# Patient Record
Sex: Female | Born: 1950
Health system: Southern US, Community
[De-identification: ages and names within clinical notes are randomized; demographics above are authoritative.]

## PROBLEM LIST (undated history)

## (undated) DIAGNOSIS — E039 Hypothyroidism, unspecified: Secondary | ICD-10-CM

## (undated) DIAGNOSIS — E785 Hyperlipidemia, unspecified: Secondary | ICD-10-CM

## (undated) DIAGNOSIS — I2510 Atherosclerotic heart disease of native coronary artery without angina pectoris: Secondary | ICD-10-CM

## (undated) DIAGNOSIS — I700 Atherosclerosis of aorta: Secondary | ICD-10-CM

## (undated) DIAGNOSIS — R519 Headache, unspecified: Secondary | ICD-10-CM

## (undated) DIAGNOSIS — I779 Disorder of arteries and arterioles, unspecified: Secondary | ICD-10-CM

## (undated) DIAGNOSIS — J449 Chronic obstructive pulmonary disease, unspecified: Secondary | ICD-10-CM

## (undated) DIAGNOSIS — C069 Malignant neoplasm of mouth, unspecified: Secondary | ICD-10-CM

## (undated) DIAGNOSIS — J439 Emphysema, unspecified: Secondary | ICD-10-CM

## (undated) DIAGNOSIS — I10 Essential (primary) hypertension: Secondary | ICD-10-CM

## (undated) DIAGNOSIS — K219 Gastro-esophageal reflux disease without esophagitis: Secondary | ICD-10-CM

## (undated) DIAGNOSIS — I739 Peripheral vascular disease, unspecified: Secondary | ICD-10-CM

## (undated) HISTORY — DX: Malignant neoplasm of mouth, unspecified: C06.9

## (undated) HISTORY — PX: TONSILLECTOMY: SUR1361

## (undated) HISTORY — DX: Atherosclerotic heart disease of native coronary artery without angina pectoris: I25.10

## (undated) HISTORY — DX: Emphysema, unspecified: J43.9

## (undated) HISTORY — DX: Disorder of arteries and arterioles, unspecified: I77.9

## (undated) HISTORY — DX: Hypothyroidism, unspecified: E03.9

## (undated) HISTORY — DX: Essential (primary) hypertension: I10

## (undated) HISTORY — DX: Atherosclerosis of aorta: I70.0

## (undated) HISTORY — PX: TUBAL LIGATION: SHX77

## (undated) HISTORY — DX: Gastro-esophageal reflux disease without esophagitis: K21.9

## (undated) HISTORY — DX: Hyperlipidemia, unspecified: E78.5

## (undated) HISTORY — PX: APPENDECTOMY: SHX54

## (undated) HISTORY — DX: Peripheral vascular disease, unspecified: I73.9

---

## 1999-12-09 ENCOUNTER — Encounter: Payer: Self-pay | Admitting: Family Medicine

## 1999-12-09 ENCOUNTER — Encounter: Admission: RE | Admit: 1999-12-09 | Discharge: 1999-12-09 | Payer: Self-pay | Admitting: Family Medicine

## 2000-12-28 ENCOUNTER — Other Ambulatory Visit: Admission: RE | Admit: 2000-12-28 | Discharge: 2000-12-28 | Payer: Self-pay | Admitting: Family Medicine

## 2007-11-13 ENCOUNTER — Other Ambulatory Visit: Admission: RE | Admit: 2007-11-13 | Discharge: 2007-11-13 | Payer: Self-pay | Admitting: Family Medicine

## 2007-11-18 ENCOUNTER — Encounter: Admission: RE | Admit: 2007-11-18 | Discharge: 2007-11-18 | Payer: Self-pay | Admitting: Family Medicine

## 2007-11-21 ENCOUNTER — Encounter: Admission: RE | Admit: 2007-11-21 | Discharge: 2007-11-21 | Payer: Self-pay | Admitting: Family Medicine

## 2007-12-17 ENCOUNTER — Ambulatory Visit: Payer: Self-pay | Admitting: Vascular Surgery

## 2008-11-27 ENCOUNTER — Encounter: Admission: RE | Admit: 2008-11-27 | Discharge: 2008-11-27 | Payer: Self-pay | Admitting: Family Medicine

## 2008-12-23 ENCOUNTER — Ambulatory Visit: Payer: Self-pay | Admitting: Vascular Surgery

## 2009-03-26 ENCOUNTER — Encounter (INDEPENDENT_AMBULATORY_CARE_PROVIDER_SITE_OTHER): Payer: Self-pay | Admitting: *Deleted

## 2009-07-15 ENCOUNTER — Ambulatory Visit: Payer: Self-pay | Admitting: Internal Medicine

## 2009-07-15 ENCOUNTER — Ambulatory Visit: Payer: Self-pay | Admitting: Cardiovascular Disease

## 2009-07-15 ENCOUNTER — Inpatient Hospital Stay (HOSPITAL_COMMUNITY): Admission: EM | Admit: 2009-07-15 | Discharge: 2009-07-17 | Payer: Self-pay | Admitting: Emergency Medicine

## 2009-07-17 ENCOUNTER — Encounter: Payer: Self-pay | Admitting: Internal Medicine

## 2009-07-18 ENCOUNTER — Encounter: Payer: Self-pay | Admitting: Internal Medicine

## 2009-07-22 DIAGNOSIS — E039 Hypothyroidism, unspecified: Secondary | ICD-10-CM | POA: Insufficient documentation

## 2009-07-22 DIAGNOSIS — I6529 Occlusion and stenosis of unspecified carotid artery: Secondary | ICD-10-CM | POA: Insufficient documentation

## 2009-07-22 DIAGNOSIS — E782 Mixed hyperlipidemia: Secondary | ICD-10-CM | POA: Insufficient documentation

## 2009-07-22 DIAGNOSIS — I10 Essential (primary) hypertension: Secondary | ICD-10-CM | POA: Insufficient documentation

## 2009-07-22 DIAGNOSIS — I201 Angina pectoris with documented spasm: Secondary | ICD-10-CM | POA: Insufficient documentation

## 2009-07-22 DIAGNOSIS — E785 Hyperlipidemia, unspecified: Secondary | ICD-10-CM

## 2009-07-22 DIAGNOSIS — F17200 Nicotine dependence, unspecified, uncomplicated: Secondary | ICD-10-CM

## 2009-07-22 DIAGNOSIS — I2510 Atherosclerotic heart disease of native coronary artery without angina pectoris: Secondary | ICD-10-CM | POA: Insufficient documentation

## 2009-07-22 DIAGNOSIS — Z720 Tobacco use: Secondary | ICD-10-CM | POA: Insufficient documentation

## 2009-08-03 ENCOUNTER — Ambulatory Visit: Payer: Self-pay | Admitting: Cardiovascular Disease

## 2009-12-08 ENCOUNTER — Other Ambulatory Visit: Admission: RE | Admit: 2009-12-08 | Discharge: 2009-12-08 | Payer: Self-pay | Admitting: Family Medicine

## 2010-01-05 ENCOUNTER — Ambulatory Visit: Payer: Self-pay | Admitting: Vascular Surgery

## 2010-06-24 ENCOUNTER — Ambulatory Visit: Payer: Self-pay | Admitting: Cardiovascular Disease

## 2010-06-24 ENCOUNTER — Encounter: Payer: Self-pay | Admitting: Cardiovascular Disease

## 2010-08-12 NOTE — Letter (Signed)
[facility]   Imported By: Roderic Ovens 08/03/2009 11:42:54  _____________________________________________________________________  External Attachment:    Type:   Image     Comment:   External Document

## 2010-08-12 NOTE — Assessment & Plan Note (Signed)
Summary: eph/jml   Visit Type:  Follow-up Primary Provider:  Beverley Fiedler, MD  CC:  pt was in hos 2 wks ago for cp..denies any cp since hosp..does have sob though.  History of Present Illness: 60 yo WF with history of HTN, hyperlipidemia, tobacco abuse, carotid artery disease and recently diagnosed non-obstructive CAD during admission to [facility] 07/15/09 with NSTEMI. Her event was felt to be secondary to coronary vasospasm. She is here today for follow up. She tells me that she has been doing well. She has had no recurrence of her chest pain. She does report some SOB at baseline but this has not changed. No near syncope, syncope, lower ext edema, palpitations.   She stopped smoking in November. She has known carotid artery disease followed by Dr. Hart Rochester.   Current Medications (verified): 1)  Aspirin Ec 325 Mg Tbec (Aspirin) .... Take One Tablet By Mouth Daily 2)  Plavix 75 Mg Tabs (Clopidogrel Bisulfate) .Marland Kitchen.. 1 Tab Once Daily 3)  Diltiazem Hcl 120 Mg Tabs (Diltiazem Hcl) .Marland Kitchen.. 1 Tab Once Daily 4)  Nitrostat 0.4 Mg Subl (Nitroglycerin) .Marland Kitchen.. 1 Tablet Under Tongue At Onset of Chest Pain; You May Repeat Every 5 Minutes For Up To 3 Doses. 5)  Caltrate 600 1500 Mg Tabs (Calcium Carbonate) .Marland Kitchen.. 1 Tab Once Daily 6)  Levothyroxine Sodium 100 Mcg Tabs (Levothyroxine Sodium) .Marland Kitchen.. 1 Tab Once Daily 7)  Multivitamins   Tabs (Multiple Vitamin) .Marland Kitchen.. 1 Tab Once Daily 8)  Simvastatin 40 Mg Tabs (Simvastatin) .Marland Kitchen.. 1 1/2 Tab Once Daily 9)  Wellbutrin Xl 300 Mg Xr24h-Tab (Bupropion Hcl) .Marland Kitchen.. 1 Tab Once Daily  Allergies (verified): 1)  ! Codeine  Past History:  Past Medical History: Current Problems:  CAD, NATIVE VESSEL (ICD-414.01) CAROTID ARTERY STENOSIS , followed by Dr Hart Rochester.  HYPERTENSION (ICD-401.9) HYPERLIPIDEMIA (ICD-272.4) TOBACCO USER (ICD-305.1)-stopped November 2010 HYPOTHYROIDISM (ICD-244.9)    Past Surgical History: Appendectomy  Family History: She has one brother  with arrhythmias.  She does not know the type.  Maternal grandmother with CAD Mother deceased  cancer Father alive, carotid artery disease      Social History: The patient is married.  One son. Former tobacco abuse-40 years, 1ppd. Stopped smoking November 2011. Social etoh use No illicit drug use   She works as a Chief Operating Officer.      Review of Systems       The patient complains of shortness of breath.  The patient denies fatigue, malaise, fever, weight gain/loss, vision loss, decreased hearing, hoarseness, chest pain, palpitations, prolonged cough, wheezing, sleep apnea, coughing up blood, abdominal pain, blood in stool, nausea, vomiting, diarrhea, heartburn, incontinence, blood in urine, muscle weakness, joint pain, leg swelling, rash, skin lesions, headache, fainting, dizziness, depression, anxiety, enlarged lymph nodes, easy bruising or bleeding, and environmental allergies.    Vital Signs:  Patient profile:   60 year old female Height:      66 inches Weight:      175 pounds BMI:     28.35 Pulse rate:   76 / minute Pulse rhythm:   irregular BP sitting:   144 / 82  (left arm) Cuff size:   large  Vitals Entered By: Danielle Rankin, CMA (August 03, 2009 8:57 AM)  Physical Exam  General:  General: Well developed, well nourished, NAD HEENT: OP clear, mucus membranes moist SKIN: warm, dry Neuro: No focal deficits Musculoskeletal: Muscle strength 5/5 all ext Psychiatric: Mood and affect normal Neck: No JVD, Faint left  carotid bruit, no right  carotid bruit, no thyromegaly, no lymphadenopathy. Lungs:Clear bilaterally, no wheezes, rhonci, crackles CV: RRR no murmurs, gallops rubs Abdomen: soft, NT, ND, BS present Extremities: No edema, pulses 2+.    Cardiac Cath  Procedure date:  07/16/2009  Findings:      HEMODYNAMIC FINDINGS:  Central aortic pressure 135/76, left ventricular pressure 149/14, left ventricular end-diastolic pressure 21.   ANGIOGRAPHIC FINDINGS: 1. The  left main coronary artery had no obstructive disease.  This     vessel bifurcated into the LAD, the circumflex and an intermediate     branch. 2. The left anterior descending is a large vessel that courses to the     apex and gives off two diagonal branches.  There appears to be a     mild 20% stenosis in the proximal portion of the vessel.  The first     diagonal is moderate size and has no disease, the second diagonal     is small in caliber and has no disease. 3. The circumflex artery is comprised mainly of an obtuse marginal     vessel that is free of any significant disease. 4. The ramus intermediate branch is moderate size and has no disease. 5. The right coronary artery is a large dominant vessel that has 20%     lesions throughout the proximal and midportion of the vessel.     There are no obstructive lesions in this vessel. 6. Left ventricular angiogram was performed in the RAO projection and     shows normal left ventricular systolic function with no wall motion     abnormalities.  No mitral regurgitation is noted.  Ejection     fraction is 50-55%.  Echocardiogram  Procedure date:  07/17/2009  Findings:      Study Conclusions     Left ventricle: The cavity size was normal. Systolic function was     normal. The estimated ejection fraction was in the range of 55%     to 60%. Wall motion was normal; there were no regional wall motion abnormalities.   No significant valvular abnormalities.  EKG  Procedure date:  08/03/2009  Findings:      NSR, rate 76 bpm. RAD.   Impression & Recommendations:  Problem # 1:  CAD, NATIVE VESSEL (ICD-414.01)  Mild non-obstructive disease. Admitted with NSTEMI likely secondary to coronary vasospasm. Will complete one month of ASA 325/Plavix 75 since she had an ACS. After one month, d/c Plavix and reduce ASA to 81 mg per day. Continue Cardizem CD but increase to 240mg  per day with elevated blood pressure.   The following medications were  removed from the medication list:    Plavix 75 Mg Tabs (Clopidogrel bisulfate) .Marland Kitchen... 1 tab once daily Her updated medication list for this problem includes:    Aspirin 81 Mg Tbec (Aspirin) .Marland Kitchen... Take one tablet by mouth daily    Diltiazem Hcl Er Beads 240 Mg Xr24h-cap (Diltiazem hcl er beads) .Marland Kitchen... Take one capsule by mouth daily    Nitrostat 0.4 Mg Subl (Nitroglycerin) .Marland Kitchen... 1 tablet under tongue at onset of chest pain; you may repeat every 5 minutes for up to 3 doses.  Her updated medication list for this problem includes:    Aspirin Ec 325 Mg Tbec (Aspirin) .Marland Kitchen... Take one tablet by mouth daily    Plavix 75 Mg Tabs (Clopidogrel bisulfate) .Marland Kitchen... 1 tab once daily    Diltiazem Hcl Er Beads 240 Mg Xr24h-cap (Diltiazem hcl er beads) .Marland Kitchen... Take one capsule by mouth  daily    Nitrostat 0.4 Mg Subl (Nitroglycerin) .Marland Kitchen... 1 tablet under tongue at onset of chest pain; you may repeat every 5 minutes for up to 3 doses.  Problem # 2:  HYPERTENSION (ICD-401.9)  See above. Increase Cardizem to 240mg  per day.   Her updated medication list for this problem includes:    Aspirin 81 Mg Tbec (Aspirin) .Marland Kitchen... Take one tablet by mouth daily    Diltiazem Hcl Er Beads 240 Mg Xr24h-cap (Diltiazem hcl er beads) .Marland Kitchen... Take one capsule by mouth daily  Her updated medication list for this problem includes:    Aspirin Ec 325 Mg Tbec (Aspirin) .Marland Kitchen... Take one tablet by mouth daily    Diltiazem Hcl Er Beads 240 Mg Xr24h-cap (Diltiazem hcl er beads) .Marland Kitchen... Take one capsule by mouth daily  Problem # 3:  CAROTID ARTERY STENOSIS (ICD-433.10) Followed by Dr. Hart Rochester. Plans for repeat dopplers March. Known to have 60-70% stenosis LICA, no significant RICA stenosis.   The following medications were removed from the medication list:    Plavix 75 Mg Tabs (Clopidogrel bisulfate) .Marland Kitchen... 1 tab once daily Her updated medication list for this problem includes:    Aspirin 81 Mg Tbec (Aspirin) .Marland Kitchen... Take one tablet by mouth  daily  Patient Instructions: 1)  Your physician recommends that you schedule a follow-up appointment in: 12 months 2)  Your physician has recommended you make the following change in your medication: Increase Diltiazem ER  to 240 mg daily. 3)  In 2 weeks decrease enteric coated aspirin to 81 mg daily. 4)  In 2 weeks stop Plavix. Prescriptions: DILTIAZEM HCL ER BEADS 240 MG XR24H-CAP (DILTIAZEM HCL ER BEADS) Take one capsule by mouth daily  #30 x 11   Entered by:   Dossie Arbour, RN, BSN   Authorized by:   Verne Carrow, MD   Signed by:   Dossie Arbour, RN, BSN on 08/03/2009   Method used:   Electronically to        [facility] (retail)       [address]       [place], [place]  [address]       Ph: [phone] or [phone]       Fax: [phone]   RxID:   [id number]

## 2010-08-12 NOTE — Assessment & Plan Note (Signed)
Summary: rov   Visit Type:  Follow-up Primary Provider:  Beverley Fiedler, MD  CC:  chest pain.  History of Present Illness: 60 yo WF with history of HTN, hyperlipidemia, tobacco abuse, carotid artery disease and non-obstructive CAD during admission to [facility] 07/15/09 with NSTEMI. Her event was felt to be secondary to coronary vasospasm. She is here today for follow up. She tells me that she has been doing well. She has occasional episodes of chest pain. These are mild and occur once every two months. She has had no SOB, near syncope or syncope.  She stopped smoking in November 2010. She has known carotid artery disease followed by Dr. Hart Rochester.   Current Medications (verified): 1)  Aspirin Ec 325 Mg Tbec (Aspirin) .... Take One Tablet By Mouth On Occasion 2)  Diltiazem Hcl Er Beads 240 Mg Xr24h-Cap (Diltiazem Hcl Er Beads) .... Take One Capsule By Mouth Daily 3)  Nitrostat 0.4 Mg Subl (Nitroglycerin) .Marland Kitchen.. 1 Tablet Under Tongue At Onset of Chest Pain; You May Repeat Every 5 Minutes For Up To 3 Doses. 4)  Caltrate 600 1500 Mg Tabs (Calcium Carbonate) .Marland Kitchen.. 1 Tab Once Daily 5)  Levothyroxine Sodium 100 Mcg Tabs (Levothyroxine Sodium) .Marland Kitchen.. 1 Tab Once Daily 6)  Multivitamins   Tabs (Multiple Vitamin) .Marland Kitchen.. 1 Tab Once Daily 7)  Simvastatin 40 Mg Tabs (Simvastatin) .Marland Kitchen.. 1 1/2 Tab Once Daily 8)  Wellbutrin Xl 300 Mg Xr24h-Tab (Bupropion Hcl) .Marland Kitchen.. 1 Tab Once Daily  Allergies: 1)  ! Codeine  Past History:  Past Medical History: Current Problems:  CAD, NATIVE VESSEL (ICD-414.01) possible coronary artery vasospasm with NSTEMI 1/11.  CAROTID ARTERY STENOSIS , followed by Dr Hart Rochester.  HYPERTENSION (ICD-401.9) HYPERLIPIDEMIA (ICD-272.4) TOBACCO USER (ICD-305.1)-stopped November 2010 HYPOTHYROIDISM (ICD-244.9)    Social History: Reviewed history from 08/03/2009 and no changes required. The patient is married.  One son. Former tobacco abuse-40 years, 1ppd. Stopped smoking November  2011. Social etoh use No illicit drug use   She works as a Chief Operating Officer.      Review of Systems       The patient complains of chest pain.  The patient denies fatigue, malaise, fever, weight gain/loss, vision loss, decreased hearing, hoarseness, palpitations, shortness of breath, prolonged cough, wheezing, sleep apnea, coughing up blood, abdominal pain, blood in stool, nausea, vomiting, diarrhea, heartburn, incontinence, blood in urine, muscle weakness, joint pain, leg swelling, rash, skin lesions, headache, fainting, dizziness, depression, anxiety, enlarged lymph nodes, easy bruising or bleeding, and environmental allergies.    Vital Signs:  Patient profile:   60 year old female Height:      66 inches Weight:      158 pounds BMI:     25.59 Pulse rate:   73 / minute BP sitting:   130 / 85  (left arm) Cuff size:   regular  Vitals Entered By: Stanton Kidney, EMT-P (June 24, 2010 4:14 PM)  Physical Exam  General:  General: Well developed, well nourished, NAD HEENT: OP clear, mucus membranes moist SKIN: warm, dry Neuro: No focal deficits Musculoskeletal: Muscle strength 5/5 all ext Psychiatric: Mood and affect normal Neck: No JVD, no carotid bruits, no thyromegaly, no lymphadenopathy. Lungs:Clear bilaterally, no wheezes, rhonci, crackles CV: RRR no murmurs, gallops rubs Abdomen: soft, NT, ND, BS present Extremities: No edema, pulses 2+.    EKG  Procedure date:  06/24/2010  Findings:      NSR, rate 73 bpm. Non-specific ST and T wave changes.   Impression & Recommendations:  Problem # 1:  CAD, NATIVE VESSEL (ICD-414.01) Stable. She has occasional episodes of chest pain which may be related to coronary vasospasm. Continue diltiazem. She has as needed NTG for severe pain. Minimal CAD by cath 1/11.   Her updated medication list for this problem includes:    Aspirin Ec 325 Mg Tbec (Aspirin) .Marland Kitchen... Take one tablet by mouth on occasion    Diltiazem Hcl Er Beads 240 Mg Xr24h-cap  (Diltiazem hcl er beads) .Marland Kitchen... Take one capsule by mouth daily    Nitrostat 0.4 Mg Subl (Nitroglycerin) .Marland Kitchen... 1 tablet under tongue at onset of chest pain; you may repeat every 5 minutes for up to 3 doses.  Orders: EKG w/ Interpretation (93000)  Problem # 2:  HYPERTENSION (ICD-401.9) BP controlled. She will check at home.   Her updated medication list for this problem includes:    Aspirin Ec 325 Mg Tbec (Aspirin) .Marland Kitchen... Take one tablet by mouth on occasion    Diltiazem Hcl Er Beads 240 Mg Xr24h-cap (Diltiazem hcl er beads) .Marland Kitchen... Take one capsule by mouth daily  Her updated medication list for this problem includes:    Aspirin Ec 325 Mg Tbec (Aspirin) .Marland Kitchen... Take one tablet by mouth on occasion    Diltiazem Hcl Er Beads 240 Mg Xr24h-cap (Diltiazem hcl er beads) .Marland Kitchen... Take one capsule by mouth daily  Patient Instructions: 1)  Your physician recommends that you schedule a follow-up appointment in: 1 year 2)  Your physician recommends that you continue on your current medications as directed. Please refer to the Current Medication list given to you today. Prescriptions: DILTIAZEM HCL ER BEADS 240 MG XR24H-CAP (DILTIAZEM HCL ER BEADS) Take one capsule by mouth daily  #30 x 11   Entered by:   Whitney Maeola Sarah RN   Authorized by:   Verne Carrow, MD   Signed by:   Ellender Hose RN on 06/24/2010   Method used:   Electronically to        [facility] (retail)       [address]       [place], [place]  [address]       Ph: [phone] or [phone]       Fax: [phone]   RxID:   [id number]

## 2010-08-12 NOTE — Letter (Signed)
Summary: [facility]   Imported By: Marylou Mccoy 08/27/2009 10:06:15  _____________________________________________________________________  External Attachment:    Type:   Image     Comment:   External Document

## 2010-09-26 LAB — POCT I-STAT, CHEM 8
BUN: 10 mg/dL (ref 6–23)
Calcium, Ion: 1.13 mmol/L (ref 1.12–1.32)
Creatinine, Ser: 0.8 mg/dL (ref 0.4–1.2)
TCO2: 24 mmol/L (ref 0–100)

## 2010-09-26 LAB — BASIC METABOLIC PANEL
Chloride: 106 mEq/L (ref 96–112)
GFR calc Af Amer: >60 mL/min (ref >60)
Potassium: 3.9 mEq/L (ref 3.5–5.1)
Sodium: 142 mEq/L (ref 135–145)

## 2010-09-26 LAB — CBC
HCT: 39.6 % (ref 36.0–46.0)
Hemoglobin: 12.8 g/dL (ref 12.0–15.0)
Hemoglobin: 13.6 g/dL (ref 12.0–15.0)
MCHC: 34.5 g/dL (ref 30.0–36.0)
MCV: 95.8 fL (ref 78.0–100.0)
RBC: 3.9 MIL/uL (ref 3.87–5.11)
RBC: 4.14 MIL/uL (ref 3.87–5.11)
WBC: 7 10*3/uL (ref 4.0–10.5)

## 2010-09-26 LAB — CK TOTAL AND CKMB (NOT AT ARMC)
CK, MB: 22.1 ng/mL (ref 0.3–4.0)
Total CK: 221 U/L — ABNORMAL HIGH (ref 7–177)

## 2010-09-26 LAB — CARDIAC PANEL(CRET KIN+CKTOT+MB+TROPI)
CK, MB: 18.8 ng/mL (ref 0.3–4.0)
CK, MB: 25.6 ng/mL (ref 0.3–4.0)

## 2010-09-26 LAB — APTT: aPTT: 77 seconds — ABNORMAL HIGH (ref 24–37)

## 2010-09-26 LAB — HEMOGLOBIN A1C
Hgb A1c MFr Bld: 5.1 % (ref 4.6–6.1)
Mean Plasma Glucose: 100 mg/dL

## 2010-09-26 LAB — POCT CARDIAC MARKERS: Myoglobin, poc: 195 ng/mL (ref 12–200)

## 2010-09-26 LAB — LIPID PANEL
HDL: 84 mg/dL (ref >39)
LDL Cholesterol: 71 mg/dL (ref 0–99)
Total CHOL/HDL Ratio: 2.1 RATIO
VLDL: 18 mg/dL (ref 0–40)

## 2010-11-23 NOTE — Procedures (Signed)
CAROTID DUPLEX EXAM   INDICATION:  Carotid disease.   HISTORY:  Diabetes:  No.  Cardiac:  No.  Hypertension:  No.  Smoking:  Yes.  Previous Surgery:  No.  CV History:  Chronic migraines with occasional dizziness/blurred vision.  Amaurosis Fugax No, Paresthesias No, Hemiparesis No.                                       RIGHT             LEFT  Brachial systolic pressure:         144               138  Brachial Doppler waveforms:         Normal            Normal  Vertebral direction of flow:        Antegrade         Antegrade  DUPLEX VELOCITIES (cm/sec)  CCA peak systolic                   83                86  ECA peak systolic                   114               109  ICA peak systolic                   85                145  ICA end diastolic                   28                47  PLAQUE MORPHOLOGY:                  Mixed             Mixed  PLAQUE AMOUNT:                      Mild              Moderate  PLAQUE LOCATION:                    ICA               ICA   IMPRESSION:  1. No hemodynamically significant stenosis of the right proximal      internal carotid artery.  2. Doppler velocities suggest 40% to 59% stenosis of the left proximal      to mid internal carotid artery.  3. Doppler velocities of the left internal carotid artery are less      than previously recorded when compared to the previous examination      on 12/23/2008 with the right internal carotid artery remaining      stable.   ___________________________________________  Quita Skye. Hart Rochester, M.D.   CH/MEDQ  D:  01/05/2010  T:  01/05/2010  Job:  [id number]

## 2010-11-23 NOTE — Procedures (Signed)
CAROTID DUPLEX EXAM   INDICATION:  Follow up carotid artery disease.  Left subclavian artery  stenosis by CT.   HISTORY:  Diabetes:  No.  Cardiac:  No.  Hypertension:  No.  Smoking:  Yes.  Previous Surgery:  No.  CV History:  The patient has migraines with associated vertigo and  double vision.  Amaurosis Fugax No, Paresthesias No, Hemiparesis No.                                       RIGHT             LEFT  Brachial systolic pressure:         156               154  Brachial Doppler waveforms:         WNL               WNL  Vertebral direction of flow:        Antegrade         Antegrade  DUPLEX VELOCITIES (cm/sec)  CCA peak systolic                   82                80  ECA peak systolic                   99                173  ICA peak systolic                   92                219  ICA end diastolic                   39                88  PLAQUE MORPHOLOGY:                  Mixed             Heterogeneous  PLAQUE AMOUNT:                      Mild              Moderate  PLAQUE LOCATION:                    ICA               ICA   IMPRESSION:  1. Right ICA shows evidence of 20% to 39% stenosis.  2. Left ICA shows evidence of 60% to 79% stenosis.  3. Bilateral vertebral arteries appear antegrade with no significant      brachial pressure variance.  4. Bilateral subclavian artery waveforms and velocities show no      significant variance and appear within normal limits.  5. No significant changes from previous study.   ___________________________________________  Quita Skye Hart Rochester, M.D.   AS/MEDQ  D:  12/23/2008  T:  12/23/2008  Job:  [id number]

## 2010-11-23 NOTE — Procedures (Signed)
CAROTID DUPLEX EXAM   INDICATION:  Follow-up evaluation of known carotid artery disease.  Left  subclavian artery stenosis by CT.   HISTORY:  Diabetes:  No.  Cardiac:  No.  Hypertension:  No.  Smoking:  Less than a pack per day.  Previous Surgery:  No.  CV History:  Patient has had several episodes of vertigo and double  vision accompanied by a migraine recently.  Previous duplex on April 25, 2006 revealed a 1-39% right ICA stenosis and a 60-79% left ICA  stenosis.  Amaurosis Fugax No, Paresthesias No, Hemiparesis No                                       RIGHT             LEFT  Brachial systolic pressure:         158               158  Brachial Doppler waveforms:         Triphasic         Triphasic  Vertebral direction of flow:        Antegrade         Antegrade  DUPLEX VELOCITIES (cm/sec)  CCA peak systolic                   81                87  ECA peak systolic                   109               141  ICA peak systolic                   69                207  ICA end diastolic                   19                80  PLAQUE MORPHOLOGY:                  Mixed             Soft, regular  PLAQUE AMOUNT:                      Mild              Moderate  PLAQUE LOCATION:                    Proximal ICA      Proximal ICA   IMPRESSION:  1. 20-39% right internal carotid artery stenosis.  2. 60-79% left internal carotid artery stenosis.  3. No significant change from previous study performed 04/25/06.  4. No evidence of significant left subclavian artery stenosis.  5. No evidence of brachial artery gradient.  6. Left vertebral artery is antegrade.   ___________________________________________  Quita Skye. Hart Rochester, M.D.   MC/MEDQ  D:  12/17/2007  T:  12/17/2007  Job:  [id number]

## 2010-11-23 NOTE — Assessment & Plan Note (Signed)
OFFICE VISIT   Tammy, Brady A  DOB:  1951-03-30                                       12/23/2008  NWGNF#:[id number]   The patient returns today for further followup regarding her possible  left subclavian occlusive disease and her moderate left carotid  occlusive disease.  She was last seen by me in June of 2009 at which  time she had some episodes of vertigo and double vision accompanied by  migraine headaches.  She has had no hemispheric or nonhemispheric TIAs,  amaurosis fugax, diplopia or syncope.  She also denies any arm  claudication symptoms or leg claudication symptoms.  She has no chest  pain but does have some bronchitis at this time.  She takes one aspirin  a day.   PHYSICAL EXAM:  Vital signs:  Blood pressure 136/77 in the right arm,  138/83 in the left arm.  Heart rate 78, respirations 14.  Neck:  She has  a soft bruit of the left carotid bifurcation.  Right neck has no bruits.  Chest:  Clear to auscultation except for a few rhonchi.  Cardiovascular:  Reveals a regular rhythm, no murmurs.  Neurologic:  Is normal.  Upper  extremity pulses are 3+ at the brachial and radial levels bilaterally  with well-perfused upper extremities.   Carotid duplex exam today continues to show a moderate (60-70%) left  internal carotid stenosis with no significant flow reduction on the  right side and no blood pressure differential between the right and left  upper extremities with antegrade vertebral flow.   I do not think we need to continue to follow her subclavian disease  which is mild at most.  We will check her on an annual basis for her  left carotid disease to be sure it does not progress.  If she develops  any symptoms she will be in touch with me.   Quita Skye Hart Rochester, M.D.  Electronically Signed   JDL/MEDQ  D:  12/23/2008  T:  12/24/2008  Job:  [id number]

## 2010-11-23 NOTE — Assessment & Plan Note (Signed)
OFFICE VISIT   SEE, BEHARRY A  DOB:  Mar 21, 1951                                       12/17/2007  EAVWU#:[id number]   The patient has been followed by me for several years for a moderate  left internal carotid stenosis, which has been asymptomatic and has been  in the 60-70% range in severity.  Recently, she had some auras in her  vision, which she thought were  due to migraine headaches which she has  had in the past.  On occasion, she had some diplopia and a CT angiogram  was performed.  This revealed some occlusive disease at the origin of  the left subclavian artery and she was referred for evaluation of this.  She denies any arm claudication symptoms or previous symptoms of  ischemia in the left upper extremity, and also denies any hemispheric or  non-hemispheric TIAs, amaurosis fugax, diplopia, blurred vision or  syncope.  She has had some occasional dizziness if she arises quickly,  and her diplopia she describes as double vision (up and down).  This is  only on occasion.   PHYSICAL EXAM:  Blood pressure 175/89 in the right arm and 172/79 in the  left arm, heart rate 69, respirations are 12.  Carotid pulses are 3+  with harsh bruit over the left bifurcation.  Neurologic exam is normal.  No palpable adenopathy in the neck.  Brachial and radial pulses are  excellent bilaterally at 3+.  Both upper extremities are well-perfused.  Chest:  Clear to auscultation.  Abdomen:  Soft, nontender with no  masses.  She has 3+ femoral and distal pulses bilaterally.   Carotid duplex exam was performed in the office today.  She continues to  have a moderate left internal carotid stenosis which is unchanged at  approximately 60-70% in severity.  She has antegrade flow in her  vertebral arteries and there is no evidence of hemodynamically  significant subclavian stenosis.   I reviewed her CT angiogram and do not think the subclavian disease is  severe enough to  warrant any further evaluation and it is asymptomatic.  We will continue to follow her every 2 years for carotid disease, unless  she should develop any symptomatology in the future, and she will  continue to take one aspirin per day.   Quita Skye Hart Rochester, M.D.  Electronically Signed   JDL/MEDQ  D:  12/18/2007  T:  12/19/2007  Job:  [id number]   cc:   Chales Salmon. Abigail Miyamoto, M.D.

## 2011-01-06 ENCOUNTER — Other Ambulatory Visit: Payer: Self-pay

## 2011-01-25 ENCOUNTER — Other Ambulatory Visit (INDEPENDENT_AMBULATORY_CARE_PROVIDER_SITE_OTHER): Payer: BC Managed Care – PPO

## 2011-01-25 DIAGNOSIS — I6529 Occlusion and stenosis of unspecified carotid artery: Secondary | ICD-10-CM

## 2011-02-01 NOTE — Procedures (Unsigned)
CAROTID DUPLEX EXAM  INDICATION:  Follow up carotid stenosis.  HISTORY: Diabetes:  No. Cardiac:  No. Hypertension:  Yes. Smoking:  Previous. Previous Surgery:  No previous carotid intervention. CV History:  Asymptomatic. Amaurosis Fugax No, Paresthesias No, Hemiparesis No.                                      RIGHT             LEFT Brachial systolic pressure:         134               128 Brachial Doppler waveforms:         WNL               WNL Vertebral direction of flow:        Antegrade         Antegrade DUPLEX VELOCITIES (cm/sec) CCA peak systolic                   79                99 ECA peak systolic                   124               151 ICA peak systolic                   110               211 ICA end diastolic                   38                71 PLAQUE MORPHOLOGY:                  Heterogenous      Heterogenous PLAQUE AMOUNT:                      Mild to moderate  Moderate to severe PLAQUE LOCATION:                    CCA, ICA          CCA, ICA, ECA  IMPRESSION: 1. Right internal carotid artery stenosis in the 1% to 39% range (high     end of range). 2. Left internal carotid artery stenosis in the 60% to 79% range.     This is unchanged since the previous study on 12/23/2008. 3. Left external carotid artery stenosis present.  ___________________________________________ Tammy Brady. Hart Rochester, M.D.  SH/MEDQ  D:  01/25/2011  T:  01/25/2011  Job:  [id number]

## 2011-07-20 ENCOUNTER — Telehealth: Payer: Self-pay | Admitting: Cardiovascular Disease

## 2011-07-20 MED ORDER — DILTIAZEM HCL ER COATED BEADS 240 MG PO CP24: 240.0000 mg | ORAL_CAPSULE | Freq: Every day | ORAL | Status: DC

## 2011-07-20 NOTE — Telephone Encounter (Signed)
New problem:  dilatizem 240 mg. [facility] in [place] . [phone].

## 2011-07-29 ENCOUNTER — Encounter: Payer: Self-pay | Admitting: Internal Medicine

## 2011-08-01 ENCOUNTER — Ambulatory Visit (INDEPENDENT_AMBULATORY_CARE_PROVIDER_SITE_OTHER): Payer: BC Managed Care – PPO | Admitting: Cardiovascular Disease

## 2011-08-01 ENCOUNTER — Encounter: Payer: Self-pay | Admitting: Cardiovascular Disease

## 2011-08-01 VITALS — BP 110/64 | HR 66 | Ht 66.0 in | Wt 169.4 lb

## 2011-08-01 DIAGNOSIS — I2510 Atherosclerotic heart disease of native coronary artery without angina pectoris: Secondary | ICD-10-CM

## 2011-08-01 NOTE — Progress Notes (Signed)
History of Present Illness: 61 yo WF with history of HTN, hyperlipidemia, former tobacco abuse, carotid artery disease and non-obstructive CAD during admission to [facility] 07/15/09 with NSTEMI. Her event was felt to be secondary to coronary vasospasm.   She is here today for follow up. She tells me that she has been doing well. She has occasional episodes of chest pain but rare.  She has had no SOB, near syncope or syncope. She stopped smoking in November 2010. She has not been exercising lately but had been walking 4 days per week. . She has known carotid artery disease followed by Dr. Hart Rochester.    Past Medical History  Diagnosis Date  . Hyperlipidemia   . Hypertension   . CAD (coronary artery disease)     Mild non-obstructive disease by cath January 2011  . Carotid artery disease     Followed by Dr Hart Rochester  . Hypothyroidism     Past Surgical History  Procedure Date  . Appendectomy     Current Outpatient Prescriptions  Medication Sig Dispense Refill  . buPROPion (WELLBUTRIN XL) 300 MG 24 hr tablet Take 300 mg by mouth daily.       . calcium carbonate (OS-CAL) 600 MG TABS Take 600 mg by mouth daily.      Marland Kitchen diltiazem (CARTIA XT) 240 MG 24 hr capsule Take 1 capsule (240 mg total) by mouth daily.  30 capsule  6  . levothyroxine (SYNTHROID, LEVOTHROID) 100 MCG tablet Take 100 mcg by mouth daily.      . Multiple Vitamin (MULTIVITAMIN) tablet Take 1 tablet by mouth daily.      . simvastatin (ZOCOR) 40 MG tablet Take 40 mg by mouth at bedtime. Takes one and a half tablets daily        Allergies  Allergen Reactions  . Codeine     History   Social History  . Marital Status: Married    Spouse Name: N/A    Number of Children: N/A  . 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 Smoking status: Former Smoker -- 1.0 packs/day for 40 years    Types: Cigarettes    Quit date: 05/11/2009  . Smokeless tobacco: Not on file    . Alcohol Use: 3.5 oz/week    7 drink(s) per week  . Drug Use: No  . Sexually Active: Not on file   Other Topics Concern  . Not on file   Social History Narrative  . No narrative on file    Family History  Problem Relation Age of Onset  . Cancer Mother   . Coronary artery disease Father   . Coronary artery disease Maternal Grandmother     Review of Systems:  As stated in the HPI and otherwise negative.   BP 110/64  Pulse 66  Ht 5\' 6"  (1.676 m)  Wt 169 lb 6.4 oz (76.839 kg)  BMI 27.34 kg/m2  Physical Examination: General: Well developed, well nourished, NAD HEENT: OP clear, mucus membranes moist SKIN: warm, dry. No rashes. Neuro: No focal deficits Musculoskeletal: Muscle strength 5/5 all ext Psychiatric: Mood and affect normal Neck: No JVD, no carotid bruits, no thyromegaly, no lymphadenopathy. Lungs:Clear bilaterally, no wheezes, rhonci, crackles Cardiovascular: Regular rate and rhythm. No murmurs, gallops or rubs. Abdomen:Soft. Bowel sounds present. Non-tender.  Extremities: No lower extremity edema. Pulses are 2 + in the bilateral DP/PT.  EKG: NSR, rate 66 bpm. Normal EKG

## 2011-08-01 NOTE — Patient Instructions (Signed)
Your physician wants you to follow-up in: 12 months.  You will receive a reminder letter in the mail two months in advance. If you don't receive a letter, please call our office to schedule the follow-up appointment.  Your physician recommends that you continue on your current medications as directed. Please refer to the Current Medication list given to you today.   

## 2011-08-01 NOTE — Assessment & Plan Note (Signed)
She is known mild non-obstructive CAD but did have vasospasm as the cause of her NSTEMI in January 2011. She has been on Cardizem and doing well with no significant chest pain. She uses SL NTG occasionally. Will refill. No other changes.

## 2011-08-05 ENCOUNTER — Other Ambulatory Visit: Payer: Self-pay | Admitting: *Deleted

## 2011-08-05 ENCOUNTER — Telehealth: Payer: Self-pay | Admitting: Cardiovascular Disease

## 2011-08-05 DIAGNOSIS — I2510 Atherosclerotic heart disease of native coronary artery without angina pectoris: Secondary | ICD-10-CM

## 2011-08-05 MED ORDER — NITROGLYCERIN 0.4 MG SL SUBL: 0.4000 mg | SUBLINGUAL_TABLET | SUBLINGUAL | Status: DC | PRN

## 2011-08-05 NOTE — Telephone Encounter (Signed)
Pt was to get her subling nitro sent in and it has not been done please send to [facility] on [address] In [place]

## 2011-08-05 NOTE — Telephone Encounter (Signed)
Message left for pt that NTG has been sent.

## 2011-08-05 NOTE — Telephone Encounter (Signed)
Fax Received. Refill Completed. Khloe Hunkele Chowoe (R.M.A)   

## 2012-02-03 ENCOUNTER — Other Ambulatory Visit: Payer: Self-pay

## 2012-02-03 DIAGNOSIS — I6529 Occlusion and stenosis of unspecified carotid artery: Secondary | ICD-10-CM

## 2012-03-13 ENCOUNTER — Ambulatory Visit: Payer: BC Managed Care – PPO | Admitting: Neurosurgery

## 2012-03-13 ENCOUNTER — Other Ambulatory Visit: Payer: BC Managed Care – PPO

## 2012-03-23 ENCOUNTER — Other Ambulatory Visit: Payer: Self-pay | Admitting: *Deleted

## 2012-03-23 MED ORDER — DILTIAZEM HCL ER COATED BEADS 240 MG PO CP24: 240.0000 mg | ORAL_CAPSULE | Freq: Every day | ORAL | Status: DC

## 2012-03-23 NOTE — Telephone Encounter (Signed)
Refilled diltiazem 

## 2012-04-02 ENCOUNTER — Encounter: Payer: Self-pay | Admitting: Neurosurgery

## 2012-04-03 ENCOUNTER — Encounter: Payer: Self-pay | Admitting: Neurosurgery

## 2012-04-03 ENCOUNTER — Other Ambulatory Visit (INDEPENDENT_AMBULATORY_CARE_PROVIDER_SITE_OTHER): Payer: BC Managed Care – PPO | Admitting: *Deleted

## 2012-04-03 ENCOUNTER — Ambulatory Visit (INDEPENDENT_AMBULATORY_CARE_PROVIDER_SITE_OTHER): Payer: BC Managed Care – PPO | Admitting: Neurosurgery

## 2012-04-03 VITALS — BP 136/73 | HR 64 | Resp 16 | Ht 66.0 in | Wt 169.0 lb

## 2012-04-03 DIAGNOSIS — I6529 Occlusion and stenosis of unspecified carotid artery: Secondary | ICD-10-CM

## 2012-04-03 NOTE — Addendum Note (Signed)
Addended by: Sharee Pimple on: 04/03/2012 04:01 PM   Modules accepted: Orders

## 2012-04-03 NOTE — Progress Notes (Signed)
[facility]  Carotid Office Note  CC: Carotid surveillance Referring Physician: Hart Rochester  History of Present Illness: 61 year old female patient of Dr. Hart Rochester with no carotid intervention history. The patient denies any signs or symptoms of CVA, TIA, amaurosis fugax or any neural deficit. The patient denies any new medical diagnoses or recent surgery.  Past Medical History  Diagnosis Date  . Hyperlipidemia   . Hypertension   . CAD (coronary artery disease)     Mild non-obstructive disease by cath January 2011  . Carotid artery disease     Followed by Dr Hart Rochester  . Hypothyroidism     ROS: [x]  Positive   [ ]  Denies    General: [ ]  Weight loss, [ ]  Fever, [ ]  chills Neurologic: [ ]  Dizziness, [ ]  Blackouts, [ ]  Seizure [ ]  Stroke, [ ]  "Mini stroke", [ ]  Slurred speech, [ ]  Temporary blindness; [ ]  weakness in arms or legs, [ ]  Hoarseness Cardiac: [ ]  Chest pain/pressure, [ ]  Shortness of breath at rest [ ]  Shortness of breath with exertion, [ ]  Atrial fibrillation or irregular heartbeat Vascular: [ ]  Pain in legs with walking, [ ]  Pain in legs at rest, [ ]  Pain in legs at night,  [ ]  Non-healing ulcer, [ ]  Blood clot in vein/DVT,   Pulmonary: [ ]  Home oxygen, [ ]  Productive cough, [ ]  Coughing up blood, [ ]  Asthma,  [ ]  Wheezing Musculoskeletal:  [ ]  Arthritis, [ ]  Low back pain, [ ]  Joint pain Hematologic: [ ]  Easy Bruising, [ ]  Anemia; [ ]  Hepatitis Gastrointestinal: [ ]  Blood in stool, [ ]  Gastroesophageal Reflux/heartburn, [ ]  Trouble swallowing Urinary: [ ]  chronic Kidney disease, [ ]  on HD - [ ]  MWF or [ ]  TTHS, [ ]  Burning with urination, [ ]  Difficulty urinating Skin: [ ]  Rashes, [ ]  Wounds Psychological: [ ]  Anxiety, [ ]  Depression   Social History History  Substance Use Topics  . Smoking status: Former Smoker -- 1.0 packs/day for 40 years    Types: Cigarettes    Quit date: 05/11/2009  . Smokeless tobacco: Not on file  . Alcohol Use: 3.5  oz/week    7 drink(s) per week    Family History Family History  Problem Relation Age of Onset  . Cancer Mother   . Coronary artery disease Father   . Coronary artery disease Maternal Grandmother     Allergies  Allergen Reactions  . Codeine     Headache     Current Outpatient Prescriptions  Medication Sig Dispense Refill  . buPROPion (WELLBUTRIN XL) 300 MG 24 hr tablet Take 300 mg by mouth daily.       . calcium carbonate (OS-CAL) 600 MG TABS Take 600 mg by mouth daily.      Marland Kitchen diltiazem (CARTIA XT) 240 MG 24 hr capsule Take 1 capsule (240 mg total) by mouth daily.  30 capsule  6  . levothyroxine (SYNTHROID, LEVOTHROID) 100 MCG tablet Take 100 mcg by mouth daily.      . Multiple Vitamin (MULTIVITAMIN) tablet Take 1 tablet by mouth daily.      . nitroGLYCERIN (NITROSTAT) 0.4 MG SL tablet Place 1 tablet (0.4 mg total) under the tongue every 5 (five) minutes as needed for chest pain.  25 tablet  6  . simvastatin (ZOCOR) 40 MG tablet Take 40 mg by mouth at bedtime. Takes one and a half tablets daily        Physical Examination  Filed Vitals:   04/03/12 1541  BP: 136/73  Pulse: 64  Resp:     Body mass index is 27.28 kg/(m^2).  General:  WDWN in NAD Gait: Normal HEENT: WNL Eyes: Pupils equal Pulmonary: normal non-labored breathing , without Rales, rhonchi,  wheezing Cardiac: RRR, without  Murmurs, rubs or gallops; Abdomen: soft, NT, no masses Skin: no rashes, ulcers noted  Vascular Exam Pulses: 3+ radial pulses bilaterally Carotid bruits: Carotid pulses to auscultation I do not detect a bruit Extremities without ischemic changes, no Gangrene , no cellulitis; no open wounds;  Musculoskeletal: no muscle wasting or atrophy   Neurologic: A&O X 3; Appropriate Affect ; SENSATION: normal; MOTOR FUNCTION:  moving all extremities equally. Speech is fluent/normal  Non-Invasive Vascular Imaging CAROTID DUPLEX 04/03/2012  Right ICA 20 - 39 % stenosis Left ICA 40 - 59 %  stenosis Left ICA stenosis is less that was previously in July 2012  ASSESSMENT/PLAN: Asymptomatic patient with bilateral carotid stenosis. The patient will followup in one year with repeat carotid duplex. The patient's questions were encouraged and answered, she is in agreement with this plan.  Lauree Chandler ANP   Clinic MD: Hart Rochester

## 2012-08-10 ENCOUNTER — Ambulatory Visit (INDEPENDENT_AMBULATORY_CARE_PROVIDER_SITE_OTHER): Payer: BC Managed Care – PPO | Admitting: Cardiovascular Disease

## 2012-08-10 ENCOUNTER — Encounter: Payer: Self-pay | Admitting: Cardiovascular Disease

## 2012-08-10 VITALS — BP 138/73 | HR 69 | Ht 65.0 in | Wt 182.0 lb

## 2012-08-10 DIAGNOSIS — I2510 Atherosclerotic heart disease of native coronary artery without angina pectoris: Secondary | ICD-10-CM

## 2012-08-10 DIAGNOSIS — R079 Chest pain, unspecified: Secondary | ICD-10-CM

## 2012-08-10 MED ORDER — NITROGLYCERIN 0.4 MG SL SUBL: 0.4000 mg | SUBLINGUAL_TABLET | SUBLINGUAL | Status: DC | PRN

## 2012-08-10 MED ORDER — PANTOPRAZOLE SODIUM 40 MG PO TBEC: 40.0000 mg | DELAYED_RELEASE_TABLET | Freq: Every day | ORAL | Status: DC

## 2012-08-10 NOTE — Patient Instructions (Addendum)
Your physician recommends that you schedule a follow-up appointment in: 4 weeks.   Your physician has recommended you make the following change in your medication:  Start Protonix 40 mg by mouth daily.  Call us in 2 weeks to let us know how you are feeling.

## 2012-08-10 NOTE — Progress Notes (Signed)
History of Present Illness: 62 yo WF with history of HTN, hyperlipidemia, former tobacco abuse, carotid artery disease and non-obstructive CAD during admission to [facility] 07/15/09 with NSTEMI. Cardiac cath 07/16/09 with 20% LAD stenosis, 20% diffuse RCA stenosis, no Circumflex disease. Her event was felt to be secondary to coronary vasospasm. She has been treated with Cardizem and uses SL NTG prn.   She is here today for follow up. She tells me that she has noticed discomfort in her chest almost every day for the last month. This is mostly at rest. She cannot associate this with meals. She thinks she has acid reflux. She has had no SOB, near syncope or syncope. She stopped smoking in November 2010. She has known carotid artery disease followed by Dr. Hart Rochester. Her pain resolves with SL NTG.   Primary Care Physician: Benetta Spar Rankins  Last Lipid Profile: Followed in primary care  Past Medical History  Diagnosis Date  . Hyperlipidemia   . Hypertension   . CAD (coronary artery disease)     Mild non-obstructive disease by cath January 2011  . Carotid artery disease     Followed by Dr Hart Rochester  . Hypothyroidism     Past Surgical History  Procedure Date  . Appendectomy     Current Outpatient Prescriptions  Medication Sig Dispense Refill  . buPROPion (WELLBUTRIN XL) 300 MG 24 hr tablet Take 300 mg by mouth daily.       . calcium carbonate (OS-CAL) 600 MG TABS Take 600 mg by mouth daily.      Marland Kitchen diltiazem (CARTIA XT) 240 MG 24 hr capsule Take 1 capsule (240 mg total) by mouth daily.  30 capsule  6  . levothyroxine (SYNTHROID, LEVOTHROID) 100 MCG tablet Take 100 mcg by mouth daily.      . Multiple Vitamin (MULTIVITAMIN) tablet Take 1 tablet by mouth daily.      . nitroGLYCERIN (NITROSTAT) 0.4 MG SL tablet Place 1 tablet (0.4 mg total) under the tongue every 5 (five) minutes as needed for chest pain.  25 tablet  6  . simvastatin (ZOCOR) 40 MG tablet Take 40 mg by mouth at bedtime.  Takes one and a half tablets daily        Allergies  Allergen Reactions  . Codeine     Headache     History   Social History  . Marital Status: Married    Spouse Name: N/A    Number of Children: N/A  . 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 Smoking status: Former Smoker -- 1.0 packs/day for 40 years    Types: Cigarettes    Quit date: 05/11/2009  . Smokeless tobacco: Not on file  . Alcohol Use: 3.5 oz/week    7 drink(s) per week  . Drug Use: No  . Sexually Active: Not on file   Other Topics Concern  . Not on file   Social History Narrative  . No narrative on file    Family History  Problem Relation Age of Onset  . Cancer Mother   . Coronary artery disease Father   . Coronary artery disease Maternal Grandmother     Review of Systems:  As stated in the HPI and otherwise negative.   BP 138/73  Pulse 69  Ht 5\' 5"  (1.651 m)  Wt 182 lb (82.555 kg)  BMI 30.29 kg/m2  Physical Examination: General: Well developed, well nourished, NAD HEENT:  OP clear, mucus membranes moist SKIN: warm, dry. No rashes. Neuro: No focal deficits Musculoskeletal: Muscle strength 5/5 all ext Psychiatric: Mood and affect normal Neck: No JVD, no carotid bruits, no thyromegaly, no lymphadenopathy. Lungs:Clear bilaterally, no wheezes, rhonci, crackles Cardiovascular: Regular rate and rhythm. No murmurs, gallops or rubs. Abdomen:Soft. Bowel sounds present. Non-tender.  Extremities: No lower extremity edema. Pulses are 2 + in the bilateral DP/PT.  EKG: NSR, rate 69 bpm. Non-specific ST segment changes.   Assessment and Plan:   1. CAD: She is known mild non-obstructive CAD but did have vasospasm as the cause of her NSTEMI in January 2011. She has been on Cardizem and doing well but recent chest discomfort. Will try Protonix 40 mg po Qdaily for possible GERD. She will call back in 2 weeks to let us know how she is feeling. If still  having CP, will add Imdur 30 mg po Qdaily at that time. I will see her back in 4 weeks. If no resolution of symptoms, will consider ischemic evaluation with stress test or cath. Continue SL NTG as needed. Instructed to go to ED if she has severe pain associated with SOB, dizziness or no resolution with SL NTG.

## 2012-08-21 ENCOUNTER — Other Ambulatory Visit (HOSPITAL_COMMUNITY): Payer: BC Managed Care – PPO

## 2012-08-22 ENCOUNTER — Encounter (HOSPITAL_COMMUNITY): Payer: BC Managed Care – PPO

## 2012-09-05 ENCOUNTER — Encounter: Payer: Self-pay | Admitting: Cardiovascular Disease

## 2012-09-05 ENCOUNTER — Ambulatory Visit (INDEPENDENT_AMBULATORY_CARE_PROVIDER_SITE_OTHER): Payer: BC Managed Care – PPO | Admitting: Cardiovascular Disease

## 2012-09-05 VITALS — BP 140/70 | HR 68 | Ht 66.0 in | Wt 181.0 lb

## 2012-09-05 DIAGNOSIS — K219 Gastro-esophageal reflux disease without esophagitis: Secondary | ICD-10-CM

## 2012-09-05 DIAGNOSIS — I2510 Atherosclerotic heart disease of native coronary artery without angina pectoris: Secondary | ICD-10-CM

## 2012-09-05 NOTE — Progress Notes (Signed)
History of Present Illness: 62 yo WF with history of HTN, hyperlipidemia, former tobacco abuse, carotid artery disease and non-obstructive CAD during admission to [facility] 07/15/09 with NSTEMI. Cardiac cath 07/16/09 with 20% LAD stenosis, 20% diffuse RCA stenosis, no Circumflex disease. Her event was felt to be secondary to coronary vasospasm. She has been treated with Cardizem and uses SL NTG prn. She stopped smoking in November 2010. She has known carotid artery disease followed by Dr. Hart Rochester.   I saw her 08/10/12 and she had c/o daily chest pains. I felt that this may be related to GERD. I started Protonix 40 mg po Qdaily.   She is here today for follow up.  She is feeling much better. She has had complete resolution of her chest pain on Protonix. She denies chest pain, SOB, near syncope or syncope.   Primary Care Physician: Benetta Spar Rankins   Last Lipid Profile: Followed in primary care   Past Medical History  Diagnosis Date  . Hyperlipidemia   . Hypertension   . CAD (coronary artery disease)     Mild non-obstructive disease by cath January 2011  . Carotid artery disease     Followed by Dr Hart Rochester  . Hypothyroidism   . GERD (gastroesophageal reflux disease)     Past Surgical History  Procedure Laterality Date  . Appendectomy      Current Outpatient Prescriptions  Medication Sig Dispense Refill  . buPROPion (WELLBUTRIN XL) 300 MG 24 hr tablet Take 300 mg by mouth daily.       . calcium carbonate (OS-CAL) 600 MG TABS Take 600 mg by mouth daily.      Marland Kitchen diltiazem (CARTIA XT) 240 MG 24 hr capsule Take 1 capsule (240 mg total) by mouth daily.  30 capsule  6  . levothyroxine (SYNTHROID, LEVOTHROID) 100 MCG tablet Take 100 mcg by mouth daily.      . Multiple Vitamin (MULTIVITAMIN) tablet Take 1 tablet by mouth daily.      . nitroGLYCERIN (NITROSTAT) 0.4 MG SL tablet Place 1 tablet (0.4 mg total) under the tongue every 5 (five) minutes as needed.  25 tablet  6  . pantoprazole  (PROTONIX) 40 MG tablet Take 1 tablet (40 mg total) by mouth daily.  30 tablet  11  . simvastatin (ZOCOR) 40 MG tablet Take 40 mg by mouth at bedtime. Takes one and a half tablets daily       No current facility-administered medications for this visit.    Allergies  Allergen Reactions  . Codeine     Headache     History   Social History  . Marital Status: Married    Spouse Name: N/A    Number of Children: N/A  . 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 Smoking status: Former Smoker -- 1.00 packs/day for 40 years    Types: Cigarettes    Quit date: 05/11/2009  . Smokeless tobacco: Not on file  . Alcohol Use: 3.5 oz/week    7 drink(s) per week  . Drug Use: No  . Sexually Active: Not on file   Other Topics Concern  . Not on file   Social History Narrative  . No narrative on file    Family History  Problem Relation Age of Onset  . Cancer Mother   . Coronary artery disease Father   . Coronary artery disease Maternal Grandmother     Review of  Systems:  As stated in the HPI and otherwise negative.   BP 140/70  Pulse 68  Ht 5\' 6"  (1.676 m)  Wt 181 lb (82.101 kg)  BMI 29.23 kg/m2  Physical Examination: General: Well developed, well nourished, NAD HEENT: OP clear, mucus membranes moist SKIN: warm, dry. No rashes. Neuro: No focal deficits Musculoskeletal: Muscle strength 5/5 all ext Psychiatric: Mood and affect normal Neck: No JVD, no carotid bruits, no thyromegaly, no lymphadenopathy. Lungs:Clear bilaterally, no wheezes, rhonci, crackles Cardiovascular: Regular rate and rhythm. No murmurs, gallops or rubs. Abdomen:Soft. Bowel sounds present. Non-tender.  Extremities: No lower extremity edema. Pulses are 2 + in the bilateral DP/PT.   Assessment and Plan:   1. CAD: She is known to have mild non-obstructive CAD but did have vasospasm as the cause of her NSTEMI in January 2011. She has been on Cardizem and  doing well. Recent chest pain likely GERD. Symptoms resolved on Protonix 40 mg po Qdaily. No changes today.   2. GERD: Continue Protonix.

## 2012-09-05 NOTE — Patient Instructions (Addendum)
Your physician wants you to follow-up in:  12 months.  You will receive a reminder letter in the mail two months in advance. If you don't receive a letter, please call our office to schedule the follow-up appointment.   

## 2012-11-29 ENCOUNTER — Other Ambulatory Visit: Payer: Self-pay | Admitting: *Deleted

## 2012-11-29 MED ORDER — DILTIAZEM HCL ER COATED BEADS 240 MG PO CP24: 240.0000 mg | ORAL_CAPSULE | Freq: Every day | ORAL | Status: DC

## 2013-04-01 ENCOUNTER — Other Ambulatory Visit: Payer: BC Managed Care – PPO

## 2013-04-01 ENCOUNTER — Ambulatory Visit: Payer: BC Managed Care – PPO | Admitting: Neurosurgery

## 2013-04-05 ENCOUNTER — Encounter: Payer: Self-pay | Admitting: Family

## 2013-04-08 ENCOUNTER — Ambulatory Visit: Payer: BC Managed Care – PPO | Admitting: Family

## 2013-04-08 ENCOUNTER — Ambulatory Visit (HOSPITAL_COMMUNITY)
Admission: RE | Admit: 2013-04-08 | Discharge: 2013-04-08 | Disposition: A | Discharge status: Home / Self Care | Payer: BC Managed Care – PPO | Source: Ambulatory Visit | Attending: Family | Admitting: Family

## 2013-04-08 DIAGNOSIS — I6529 Occlusion and stenosis of unspecified carotid artery: Secondary | ICD-10-CM

## 2013-04-08 NOTE — Progress Notes (Deleted)
Established Carotid Patient  Previous Carotid surgery: {yes/no:20286} Surgeon:  History of Present Illness  Tammy Brady is a 62 y.o. female patient of Dr. Hart Rochester with no carotid intervention history, returns today for scheduled Duplex surveillance of carotid arteries. .  Patient {HAS/HAS NOT:20194} had previous {Right, left-initial cap:5607} CEA.  Patient has {FINDINGS; POSITIVE NEGATIVE:5518501811::"Negative"} history of TIA or stroke symptom.  The patient {Actions; denies-reports:120008::"denies"} amaurosis fugax or monocular blindness.  The patient  {Actions; denies-reports:120008::"denies"} facial drooping.  Pt. {Actions; denies-reports:120008::"denies"} hemiplegia.  The patient {Actions; denies-reports:120008::"denies"} receptive or expressive aphasia.  Pt. {Actions; denies-reports:120008::"denies"} extremity weakness.  The patient's previous neurologic deficits are {improved/worse/unchanged:3041574}.   {Actions; denies-reports:120008::"denies"} New Medical or Surgical History: ***  Pt Diabetic: {yes/no:20286} Pt smoker: {Smoker?:15292}  Pt meds include: Statin : {yes no:315493::"Yes"} Betablocker: {yes no:315493::"Yes"} ASA: {yes no:315493::"Yes"} Other anticoagulants/antiplatelets: ***   Past Medical History  Diagnosis Date  . Hyperlipidemia   . Hypertension   . CAD (coronary artery disease)     Mild non-obstructive disease by cath January 2011  . Carotid artery disease     Followed by Dr Hart Rochester  . Hypothyroidism   . GERD (gastroesophageal reflux disease)     Social History History  Substance Use Topics  . Smoking status: Former Smoker -- 1.00 packs/day for 40 years    Types: Cigarettes    Quit date: 05/11/2009  . Smokeless tobacco: Not on file  . Alcohol Use: 3.5 oz/week    7 drink(s) per week    Family History Family History  Problem Relation Age of Onset  . Cancer Mother   . Coronary artery disease Father   . Coronary artery disease Maternal  Grandmother     Surgical History Past Surgical History  Procedure Laterality Date  . Appendectomy      Allergies  Allergen Reactions  . Codeine     Headache     Current Outpatient Prescriptions  Medication Sig Dispense Refill  . buPROPion (WELLBUTRIN XL) 300 MG 24 hr tablet Take 300 mg by mouth daily.       . calcium carbonate (OS-CAL) 600 MG TABS Take 600 mg by mouth daily.      Marland Kitchen diltiazem (CARTIA XT) 240 MG 24 hr capsule Take 1 capsule (240 mg total) by mouth daily.  30 capsule  8  . levothyroxine (SYNTHROID, LEVOTHROID) 100 MCG tablet Take 100 mcg by mouth daily.      . Multiple Vitamin (MULTIVITAMIN) tablet Take 1 tablet by mouth daily.      . nitroGLYCERIN (NITROSTAT) 0.4 MG SL tablet Place 1 tablet (0.4 mg total) under the tongue every 5 (five) minutes as needed.  25 tablet  6  . pantoprazole (PROTONIX) 40 MG tablet Take 1 tablet (40 mg total) by mouth daily.  30 tablet  11  . simvastatin (ZOCOR) 40 MG tablet Take 40 mg by mouth at bedtime. Takes one and a half tablets daily       No current facility-administered medications for this visit.    Review of Systems : [x]  Positive   [ ]  Denies  General:[ ]  Weight loss,  [ ]  Weight gain, [ ]  Loss of appetite, [ ]  Fever, [ ]  chills  Neurologic: [ ]  Dizziness, [ ]  Blackouts, [ ]  Headaches, [ ]  Seizure [ ]  Stroke, [ ]  "Mini stroke", [ ]  Slurred speech, [ ]  Temporary blindness;  [ ] weakness,  Ear/Nose/Throat: [ ]  Change in hearing, [ ]  Nose bleeds, [ ]  Hoarseness  Vascular:[ ]  Pain in legs with  walking, [ ]  Pain in feet while lying flat , [ ]   Non-healing ulcer, [ ]  Blood clot in vein,    Pulmonary: [ ]  Home oxygen, [ ]   Productive cough, [ ]  Bronchitis, [ ]  Coughing up blood,  [ ]  Asthma, [ ]  Wheezing  Musculoskeletal:  [ ]  Arthritis, [ ]  Joint pain, [ ]  low back pain  Cardiac: [ ]  Chest pain, [ ]  Shortness of breath when lying flat, [ ]  Shortness of breath with exertion, [ ]  Palpitations, [ ]  Heart murmur, [ ]   Atrial  fibrillation  Hematologic:[ ]  Easy Bruising, [ ]  Anemia; [ ]  Hepatitis  Psychiatric: [ ]   Depression, [ ]  Anxiety   Gastrointestinal: [ ]  Black stool, [ ]  Blood in stool, [ ]  Peptic ulcer disease,  [ ]  Gastroesophageal Reflux, [ ]  Trouble swallowing, [ ]  Diarrhea, [ ]  Constipation  Urinary: [ ]  chronic Kidney disease, [ ]  on HD, [ ]  Burning with urination, [ ]  Frequent urination, [ ]  Difficulty urinating;   Skin: [ ]  Rashes, [ ]  Wounds    Physical Examination  There were no vitals filed for this visit.  General: WDWN {Desc; female/female:11659} in NAD GAIT: {PE ZOXW:960454} Eyes: PERRLA Pulmonary:  CTAB, {FINDINGS; POSITIVE NEGATIVE:([phone]}  Rales, {FINDINGS; POSITIVE NEGATIVE:([phone] [phone]} rhonchi, & {FINDINGS; POSITIVE NEGATIVE:(972) [phone]} wheezing.  Cardiac: {Desc; regular/irreg:14544} Rhythm ,  {FINDINGS; POSITIVE NEGATIVE:(972) [phone]} Murmurs.  VASCULAR EXAM Carotid Bruits Left Right   {FINDINGS; POSITIVE NEGATIVE:([phone]} {FINDINGS; POSITIVE NEGATIVE:([phone] [phone]}                                                                                                                                LE Pulses LEFT RIGHT       FEMORAL  {PE DOPPLER EXAM ORTHOSURG:330610::"*** palpable"}  {PE DOPPLER EXAM ORTHOSURG:330610::"*** palpable"}        POPLITEAL  {PE DOPPLER EXAM ORTHOSURG:330610::"*** palpable"}   {PE DOPPLER EXAM ORTHOSURG:330610::"*** palpable"}       POSTERIOR TIBIAL  {PE DOPPLER EXAM ORTHOSURG:330610::"*** palpable"}   {PE DOPPLER EXAM ORTHOSURG:330610::"*** palpable"}        DORSALIS PEDIS      ANTERIOR TIBIAL {PE DOPPLER EXAM ORTHOSURG:330610::"*** palpable"}   {PE DOPPLER EXAM ORTHOSURG:330610::"*** palpable"}        PERONEAL {PE DOPPLER EXAM ORTHOSURG:330610::"*** Palpable"}   {PE DOPPLER EXAM ORTHOSURG:330610::"*** Palpable"}       Gastrointestinal: {UJ:8119147},  {pos/neg/not done:321853} masses.  Musculoskeletal: {FINDINGS; POSITIVE  NEGATIVE:(972) [phone]} muscle atrophy/wasting. M/S 5/5 throughout *** except ***, Extremities without ischemic changes *** except  ***  Neurologic: A&O X 3; Appropriate Affect ; SENSATION ;{vibratory sensation:19809}; MOTOR FUNCTION: {Neuro motor system:31838} Speech is {Findings; speech psychiatric:30485} CN 2-12 intact *** except ***, Pain and light touch intact in extremities *** except ***, Motor exam as listed above.   Non-Invasive Vascular Imaging CAROTID DUPLEX 04/08/2013   Right ICA {Vasm carotid art exam %:30973} stenosis Left ICA {Vasm carotid art exam %:30973} stenosis  Previous carotid studies demonstrated: RICA {  Vasm carotid art exam %:30973} stenosis, LICA {Vasm carotid art exam %:30973} stenosis.  These findings are {improved/worse/unchanged:3041574} from previous exam  Previous angiogram: {yes no:315493::"Yes"} with findings of ***   Assessment: Tammy Brady is a 62 y.o. female who presents with:{Desc;symptomatic/asymptomatic:10923}{Vasm carotid art exam %:30973} {Right, left-initial cap:5607} ICA  stenosis The  ICA stenosis is  {improved/worse/unchanged:3041574} from previous exam.  Plan: Follow-up in {NUMBERS 1-10:18281} {days/wks/mos/yrs:310907} with Carotid Duplex scan and ***   I discussed in depth with the patient the nature of atherosclerosis, and emphasized the importance of maximal medical management including strict control of blood pressure, blood glucose, and lipid levels, obtaining regular exercise, and cessation of smoking.  The patient is aware that without maximal medical management the underlying atherosclerotic disease process will progress, limiting the benefit of any interventions. The patient was given information about stroke prevention and what symptoms should prompt the patient to seek immediate medical care. Thank you for allowing [place] to participate in this patient's care.  Charisse March, RN, MSN, FNP-C [facility]  [place] Office: [phone]  Clinic Physician: Myra Gianotti  04/08/2013 1:44 PM

## 2013-04-08 NOTE — Progress Notes (Signed)
A user error has taken place: encounter opened in error, closed for administrative reasons.

## 2013-04-08 NOTE — Patient Instructions (Signed)
Stroke Prevention Some medical conditions and behaviors are associated with an increased chance of having a stroke. You may prevent a stroke by making healthy choices and managing medical conditions. Reduce your risk of having a stroke by:  Staying physically active. Get at least 30 minutes of activity on most or all days.  Not smoking. It may also be helpful to avoid exposure to secondhand smoke.  Limiting alcohol use. Moderate alcohol use is considered to be:  No more than 2 drinks per day for men.  No more than 1 drink per day for nonpregnant women.  Eating healthy foods.  Include 5 or more servings of fruits and vegetables a day.  Certain diets may be prescribed to address high blood pressure, high cholesterol, diabetes, or obesity.  Managing your cholesterol levels.  A low-saturated fat, low-trans fat, low-cholesterol, and high-fiber diet may control cholesterol levels.  Take any prescribed medicines to control cholesterol as directed by your caregiver.  Managing your diabetes.  A controlled-carbohydrate, controlled-sugar diet is recommended to manage diabetes.  Take any prescribed medicines to control diabetes as directed by your caregiver.  Controlling your high blood pressure (hypertension).  A low-salt (sodium), low-saturated fat, low-trans fat, and low-cholesterol diet is recommended to manage high blood pressure.  Take any prescribed medicines to control hypertension as directed by your caregiver.  Maintaining a healthy weight.  A reduced-calorie, low-sodium, low-saturated fat, low-trans fat, low-cholesterol diet is recommended to manage weight.  Stopping drug abuse.  Avoiding birth control pills.  Talk to your caregiver about the risks of taking birth control pills if you are over 35 years old, smoke, get migraines, or have ever had a blood clot.  Getting evaluated for sleep disorders (sleep apnea).  Talk to your caregiver about getting a sleep evaluation  if you snore a lot or have excessive sleepiness.  Taking medicines as directed by your caregiver.  For some people, aspirin or blood thinners (anticoagulants) are helpful in reducing the risk of forming abnormal blood clots that can lead to stroke. If you have the irregular heart rhythm of atrial fibrillation, you should be on a blood thinner unless there is a good reason you cannot take them.  Understand all your medicine instructions. SEEK IMMEDIATE MEDICAL CARE IF:   You have sudden weakness or numbness of the face, arm, or leg, especially on one side of the body.  You have sudden confusion.  You have trouble speaking (aphasia) or understanding.  You have sudden trouble seeing in one or both eyes.  You have sudden trouble walking.  You have dizziness.  You have a loss of balance or coordination.  You have a sudden, severe headache with no known cause.  You have new chest pain or an irregular heartbeat. Any of these symptoms may represent a serious problem that is an emergency. Do not wait to see if the symptoms will go away. Get medical help right away. Call your local emergency services (911 in [place] Do not drive yourself to the hospital. Document Released: 08/04/2004 Document Revised: 09/19/2011 Document Reviewed: 02/14/2011 [employer] Patient Information 2014 [employer].  

## 2013-04-09 ENCOUNTER — Other Ambulatory Visit: Payer: Self-pay | Admitting: *Deleted

## 2013-04-18 ENCOUNTER — Encounter: Payer: Self-pay | Admitting: Surgery

## 2013-05-16 ENCOUNTER — Other Ambulatory Visit: Payer: Self-pay

## 2013-09-25 ENCOUNTER — Other Ambulatory Visit: Payer: Self-pay | Admitting: *Deleted

## 2013-09-25 DIAGNOSIS — I2510 Atherosclerotic heart disease of native coronary artery without angina pectoris: Secondary | ICD-10-CM

## 2013-09-25 MED ORDER — DILTIAZEM HCL ER COATED BEADS 240 MG PO CP24: 240.0000 mg | ORAL_CAPSULE | Freq: Every day | ORAL | Status: DC

## 2013-09-25 MED ORDER — PANTOPRAZOLE SODIUM 40 MG PO TBEC: 40.0000 mg | DELAYED_RELEASE_TABLET | Freq: Every day | ORAL | Status: DC

## 2013-11-11 ENCOUNTER — Ambulatory Visit (INDEPENDENT_AMBULATORY_CARE_PROVIDER_SITE_OTHER): Payer: BC Managed Care – PPO | Admitting: Cardiovascular Disease

## 2013-11-11 ENCOUNTER — Encounter: Payer: Self-pay | Admitting: Cardiovascular Disease

## 2013-11-11 VITALS — BP 140/100 | HR 65 | Ht 66.0 in | Wt 155.0 lb

## 2013-11-11 DIAGNOSIS — K219 Gastro-esophageal reflux disease without esophagitis: Secondary | ICD-10-CM

## 2013-11-11 DIAGNOSIS — I2510 Atherosclerotic heart disease of native coronary artery without angina pectoris: Secondary | ICD-10-CM

## 2013-11-11 MED ORDER — PANTOPRAZOLE SODIUM 40 MG PO TBEC: 40.0000 mg | DELAYED_RELEASE_TABLET | Freq: Every day | ORAL | Status: DC

## 2013-11-11 MED ORDER — NITROGLYCERIN 0.4 MG SL SUBL: 0.4000 mg | SUBLINGUAL_TABLET | SUBLINGUAL | Status: DC | PRN

## 2013-11-11 MED ORDER — DILTIAZEM HCL ER COATED BEADS 240 MG PO CP24: 240.0000 mg | ORAL_CAPSULE | Freq: Every day | ORAL | Status: DC

## 2013-11-11 NOTE — Patient Instructions (Signed)
Your physician wants you to follow-up in:  12 months.  You will receive a reminder letter in the mail two months in advance. If you don't receive a letter, please call our office to schedule the follow-up appointment.   

## 2013-11-11 NOTE — Progress Notes (Signed)
History of Present Illness: 63 yo WF with history of HTN, hyperlipidemia, former tobacco abuse, carotid artery disease and CAD here today for cardiac follow up. She was admitted to [facility] 07/15/09 with NSTEMI. Cardiac cath 07/16/09 with 20% LAD stenosis, 20% diffuse RCA stenosis, no Circumflex disease. Her event was felt to be secondary to coronary vasospasm. She has been treated with Cardizem and uses SL NTG prn. She stopped smoking in November 2010. She has known carotid artery disease followed by Dr. Kellie Simmering.   I saw her 08/10/12 and she had c/o daily chest pains. I felt that this may be related to GERD. I started Protonix 40 mg po Qdaily and her symptoms improved.   She is here today for follow up. She denies chest pain, SOB, near syncope or syncope.   Primary Care Physician: Jordan Hawks Rankins   Last Lipid Profile: Followed in primary care   Past Medical History  Diagnosis Date  . Hyperlipidemia   . Hypertension   . CAD (coronary artery disease)     Mild non-obstructive disease by cath January 2011  . Carotid artery disease     Followed by Dr Kellie Simmering  . Hypothyroidism   . GERD (gastroesophageal reflux disease)     Past Surgical History  Procedure Laterality Date  . Appendectomy      Current Outpatient Prescriptions  Medication Sig Dispense Refill  . calcium carbonate (OS-CAL) 600 MG TABS Take 600 mg by mouth daily.      Marland Kitchen diltiazem (CARTIA XT) 240 MG 24 hr capsule Take 1 capsule (240 mg total) by mouth daily.  30 capsule  0  . levothyroxine (SYNTHROID, LEVOTHROID) 100 MCG tablet Take 100 mcg by mouth daily.      . Multiple Vitamin (MULTIVITAMIN) tablet Take 1 tablet by mouth daily.      . nitroGLYCERIN (NITROSTAT) 0.4 MG SL tablet Place 1 tablet (0.4 mg total) under the tongue every 5 (five) minutes as needed.  25 tablet  6  . pantoprazole (PROTONIX) 40 MG tablet Take 1 tablet (40 mg total) by mouth daily.  30 tablet  0  . simvastatin (ZOCOR) 40 MG tablet Take 40 mg  by mouth at bedtime. Takes one and a half tablets daily       No current facility-administered medications for this visit.    Allergies  Allergen Reactions  . Codeine     Headache     History   Social History  . Marital Status: Married    Spouse Name: N/A    Number of Children: N/A  . 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 Smoking status: Former Smoker -- 1.00 packs/day for 40 years    Types: Cigarettes    Quit date: 05/11/2009  . Smokeless tobacco: Not on file  . Alcohol Use: 3.5 oz/week    7 drink(s) per week  . Drug Use: No  . Sexual Activity: Not on file   Other Topics Concern  . Not on file   Social History Narrative  . No narrative on file    Family History  Problem Relation Age of Onset  . Cancer Mother   . Coronary artery disease Father   . Coronary artery disease Maternal Grandmother     Review of Systems:  As stated in the HPI and otherwise negative.   BP 140/100  Pulse 65  Ht 5\' 6"  (1.676 m)  Wt 155 lb (  70.308 kg)  BMI 25.03 kg/m2  Physical Examination: General: Well developed, well nourished, NAD HEENT: OP clear, mucus membranes moist SKIN: warm, dry. No rashes. Neuro: No focal deficits Musculoskeletal: Muscle strength 5/5 all ext Psychiatric: Mood and affect normal Neck: No JVD, no carotid bruits, no thyromegaly, no lymphadenopathy. Lungs:Clear bilaterally, no wheezes, rhonci, crackles Cardiovascular: Regular rate and rhythm. No murmurs, gallops or rubs. Abdomen:Soft. Bowel sounds present. Non-tender.  Extremities: No lower extremity edema. Pulses are 2 + in the bilateral DP/PT.  EKG: NSR, rate 65 bpm.    Assessment and Plan:   1. CAD: She is known to have mild non-obstructive CAD but did have vasospasm as the cause of her NSTEMI in January 2011. She has been on Cardizem and doing well.    2. GERD: Continue Protonix.

## 2014-03-06 ENCOUNTER — Encounter: Payer: Self-pay | Admitting: Gastroenterology

## 2014-09-11 ENCOUNTER — Encounter: Payer: Self-pay | Admitting: Gastroenterology

## 2014-11-25 ENCOUNTER — Other Ambulatory Visit: Payer: Self-pay | Admitting: *Deleted

## 2014-11-25 MED ORDER — DILTIAZEM HCL ER COATED BEADS 240 MG PO CP24: 240.0000 mg | ORAL_CAPSULE | Freq: Every day | ORAL | Status: DC

## 2015-02-05 ENCOUNTER — Ambulatory Visit (INDEPENDENT_AMBULATORY_CARE_PROVIDER_SITE_OTHER): Payer: BLUE CROSS/BLUE SHIELD | Admitting: Cardiovascular Disease

## 2015-02-05 ENCOUNTER — Encounter: Payer: Self-pay | Admitting: Cardiovascular Disease

## 2015-02-05 VITALS — BP 142/50 | HR 71 | Ht 65.5 in | Wt 175.0 lb

## 2015-02-05 DIAGNOSIS — I779 Disorder of arteries and arterioles, unspecified: Secondary | ICD-10-CM | POA: Diagnosis not present

## 2015-02-05 DIAGNOSIS — I2510 Atherosclerotic heart disease of native coronary artery without angina pectoris: Secondary | ICD-10-CM | POA: Diagnosis not present

## 2015-02-05 DIAGNOSIS — I739 Peripheral vascular disease, unspecified: Secondary | ICD-10-CM

## 2015-02-05 DIAGNOSIS — K219 Gastro-esophageal reflux disease without esophagitis: Secondary | ICD-10-CM | POA: Diagnosis not present

## 2015-02-05 MED ORDER — PANTOPRAZOLE SODIUM 40 MG PO TBEC: 40.0000 mg | DELAYED_RELEASE_TABLET | Freq: Every day | ORAL | Status: DC

## 2015-02-05 NOTE — Patient Instructions (Signed)
Medication Instructions:  Your physician recommends that you continue on your current medications as directed. Please refer to the Current Medication list given to you today.   Labwork: none  Testing/Procedures: none  Follow-Up: Your physician wants you to follow-up in:  12 months.  You will receive a reminder letter in the mail two months in advance. If you don't receive a letter, please call our office to schedule the follow-up appointment.        

## 2015-02-05 NOTE — Progress Notes (Signed)
Chief Complaint  Patient presents with  . Follow-up     History of Present Illness: 64 yo WF with history of HTN, hyperlipidemia, former tobacco abuse, carotid artery disease and CAD here today for cardiac follow up. She was admitted to [facility] 07/15/09 with NSTEMI. Cardiac cath 07/16/09 with 20% LAD stenosis, 20% diffuse RCA stenosis, no Circumflex disease. Her event was felt to be secondary to coronary vasospasm. She has been treated with Cardizem and uses SL NTG prn. She stopped smoking in November 2010. She has known carotid artery disease followed by Dr. Kellie Simmering.   I saw her 08/10/12 and she had c/o daily chest pains. I felt that this may be related to GERD. I started Protonix 40 mg po Qdaily and her symptoms improved.   She is here today for follow up. She denies chest pain, SOB, near syncope or syncope. She has no heartburn while taking Protonix.   Primary Care Physician: Teressa Lower  Last Lipid Profile: Followed in primary care   Past Medical History  Diagnosis Date  . Hyperlipidemia   . Hypertension   . CAD (coronary artery disease)     Mild non-obstructive disease by cath January 2011  . Carotid artery disease     Followed by Dr Kellie Simmering  . Hypothyroidism   . GERD (gastroesophageal reflux disease)     Past Surgical History  Procedure Laterality Date  . Appendectomy      Current Outpatient Prescriptions  Medication Sig Dispense Refill  . calcium carbonate (OS-CAL) 600 MG TABS Take 600 mg by mouth daily.    Marland Kitchen diltiazem (CARTIA XT) 240 MG 24 hr capsule Take 1 capsule (240 mg total) by mouth daily. 30 capsule 2  . levothyroxine (SYNTHROID, LEVOTHROID) 100 MCG tablet Take 100 mcg by mouth daily.    . Multiple Vitamin (MULTIVITAMIN) tablet Take 1 tablet by mouth daily.    . nitroGLYCERIN (NITROSTAT) 0.4 MG SL tablet Place 1 tablet (0.4 mg total) under the tongue every 5 (five) minutes as needed. 25 tablet 6  . pantoprazole (PROTONIX) 40 MG tablet Take 1 tablet (40  mg total) by mouth daily. 90 tablet 3  . simvastatin (ZOCOR) 40 MG tablet Take 40 mg by mouth at bedtime. Takes one and a half tablets daily     No current facility-administered medications for this visit.    Allergies  Allergen Reactions  . Codeine     Headache     History   Social History  . Marital Status: Married    Spouse Name: N/A  . Number of Children: N/A  . 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 Smoking status: Former Smoker -- 1.00 packs/day for 40 years    Types: Cigarettes    Quit date: 05/11/2009  . Smokeless tobacco: Not on file  . Alcohol Use: 3.5 oz/week    7 drink(s) per week  . Drug Use: No  . Sexual Activity: Not on file   Other Topics Concern  . Not on file   Social History Narrative    Family History  Problem Relation Age of Onset  . Cancer Mother   . Coronary artery disease Father   . Coronary artery disease Maternal Grandmother     Review of Systems:  As stated in the HPI and otherwise negative.   BP 142/50 mmHg  Pulse 71  Ht 5' 5.5" (1.664 m)  Wt 175 lb (79.379 kg)  BMI 28.67 kg/m2  Physical Examination: General: Well developed, well nourished, NAD HEENT: OP clear, mucus membranes moist SKIN: warm, dry. No rashes. Neuro: No focal deficits Musculoskeletal: Muscle strength 5/5 all ext Psychiatric: Mood and affect normal Neck: No JVD, left carotid bruit,  no thyromegaly, no lymphadenopathy. Lungs:Clear bilaterally, no wheezes, rhonci, crackles Cardiovascular: Regular rate and rhythm. No murmurs, gallops or rubs. Abdomen:Soft. Bowel sounds present. Non-tender.  Extremities: No lower extremity edema. Pulses are 2 + in the bilateral DP/PT.  EKG:  EKG is ordered today. The ekg ordered today demonstrates NSR, rate 71 bpm.   Recent Labs: No results found for requested labs within last 365 days.     Wt Readings from Last 3 Encounters:  02/05/15 175 lb (79.379 kg)  11/11/13  155 lb (70.308 kg)  09/05/12 181 lb (82.101 kg)     Other studies Reviewed: Additional studies/ records that were reviewed today include: . Review of the above records demonstrates:    Assessment and Plan:   1. CAD: She is known to have mild non-obstructive CAD but did have vasospasm as the cause of her NSTEMI in January 2011. She has been on Cardizem and doing well.    2. GERD: Continue Protonix.   3. Carotid artery disease: Moderate bilateral disease. Due for f/u in September with Dr. Kellie Simmering in [place].   Current medicines are reviewed at length with the patient today.  The patient does not have concerns regarding medicines.  The following changes have been made:  no change  Labs/ tests ordered today include:   Orders Placed This Encounter  Procedures  . EKG 12-Lead    Disposition:   FU with me in 12  months  Signed, Lauree Chandler, MD 02/05/2015 2:04 PM    [facility] [place], [place], [place]  [address] Phone: [phone]; Fax: [phone]

## 2015-04-03 ENCOUNTER — Other Ambulatory Visit: Payer: Self-pay | Admitting: *Deleted

## 2015-04-03 MED ORDER — DILTIAZEM HCL ER COATED BEADS 240 MG PO CP24: 240.0000 mg | ORAL_CAPSULE | Freq: Every day | ORAL | Status: DC

## 2015-04-06 ENCOUNTER — Encounter: Payer: Self-pay | Admitting: Family

## 2015-04-08 ENCOUNTER — Ambulatory Visit (INDEPENDENT_AMBULATORY_CARE_PROVIDER_SITE_OTHER): Payer: BLUE CROSS/BLUE SHIELD | Admitting: Family

## 2015-04-08 ENCOUNTER — Ambulatory Visit (HOSPITAL_COMMUNITY)
Admission: RE | Admit: 2015-04-08 | Discharge: 2015-04-08 | Disposition: A | Discharge status: Home / Self Care | Payer: BLUE CROSS/BLUE SHIELD | Source: Ambulatory Visit | Attending: Family | Admitting: Family

## 2015-04-08 ENCOUNTER — Encounter: Payer: Self-pay | Admitting: Family

## 2015-04-08 ENCOUNTER — Other Ambulatory Visit: Payer: Self-pay | Admitting: Surgery

## 2015-04-08 VITALS — BP 163/87 | HR 67 | Temp 98.5°F | Resp 16 | Ht 66.5 in | Wt 179.0 lb

## 2015-04-08 DIAGNOSIS — K219 Gastro-esophageal reflux disease without esophagitis: Secondary | ICD-10-CM | POA: Insufficient documentation

## 2015-04-08 DIAGNOSIS — I2510 Atherosclerotic heart disease of native coronary artery without angina pectoris: Secondary | ICD-10-CM | POA: Diagnosis not present

## 2015-04-08 DIAGNOSIS — E785 Hyperlipidemia, unspecified: Secondary | ICD-10-CM | POA: Insufficient documentation

## 2015-04-08 DIAGNOSIS — Z87891 Personal history of nicotine dependence: Secondary | ICD-10-CM

## 2015-04-08 DIAGNOSIS — I10 Essential (primary) hypertension: Secondary | ICD-10-CM | POA: Insufficient documentation

## 2015-04-08 DIAGNOSIS — I6523 Occlusion and stenosis of bilateral carotid arteries: Secondary | ICD-10-CM | POA: Diagnosis not present

## 2015-04-08 NOTE — Patient Instructions (Signed)
Stroke Prevention Some medical conditions and behaviors are associated with an increased chance of having a stroke. You may prevent a stroke by making healthy choices and managing medical conditions. HOW CAN I REDUCE MY RISK OF HAVING A STROKE?   Stay physically active. Get at least 30 minutes of activity on most or all days.  Do not smoke. It may also be helpful to avoid exposure to secondhand smoke.  Limit alcohol use. Moderate alcohol use is considered to be:  No more than 2 drinks per day for men.  No more than 1 drink per day for nonpregnant women.  Eat healthy foods. This involves:  Eating 5 or more servings of fruits and vegetables a day.  Making dietary changes that address high blood pressure (hypertension), high cholesterol, diabetes, or obesity.  Manage your cholesterol levels.  Making food choices that are high in fiber and low in saturated fat, trans fat, and cholesterol may control cholesterol levels.  Take any prescribed medicines to control cholesterol as directed by your health care provider.  Manage your diabetes.  Controlling your carbohydrate and sugar intake is recommended to manage diabetes.  Take any prescribed medicines to control diabetes as directed by your health care provider.  Control your hypertension.  Making food choices that are low in salt (sodium), saturated fat, trans fat, and cholesterol is recommended to manage hypertension.  Take any prescribed medicines to control hypertension as directed by your health care provider.  Maintain a healthy weight.  Reducing calorie intake and making food choices that are low in sodium, saturated fat, trans fat, and cholesterol are recommended to manage weight.  Stop drug abuse.  Avoid taking birth control pills.  Talk to your health care provider about the risks of taking birth control pills if you are over 35 years old, smoke, get migraines, or have ever had a blood clot.  Get evaluated for sleep  disorders (sleep apnea).  Talk to your health care provider about getting a sleep evaluation if you snore a lot or have excessive sleepiness.  Take medicines only as directed by your health care provider.  For some people, aspirin or blood thinners (anticoagulants) are helpful in reducing the risk of forming abnormal blood clots that can lead to stroke. If you have the irregular heart rhythm of atrial fibrillation, you should be on a blood thinner unless there is a good reason you cannot take them.  Understand all your medicine instructions.  Make sure that other conditions (such as anemia or atherosclerosis) are addressed. SEEK IMMEDIATE MEDICAL CARE IF:   You have sudden weakness or numbness of the face, arm, or leg, especially on one side of the body.  Your face or eyelid droops to one side.  You have sudden confusion.  You have trouble speaking (aphasia) or understanding.  You have sudden trouble seeing in one or both eyes.  You have sudden trouble walking.  You have dizziness.  You have a loss of balance or coordination.  You have a sudden, severe headache with no known cause.  You have new chest pain or an irregular heartbeat. Any of these symptoms may represent a serious problem that is an emergency. Do not wait to see if the symptoms will go away. Get medical help at once. Call your local emergency services (911 in [place] Do not drive yourself to the hospital. Document Released: 08/04/2004 Document Revised: 11/11/2013 Document Reviewed: 12/28/2012 [employer] Patient Information 2015 [employer]. This information is not intended to replace advice given   to you by your health care provider. Make sure you discuss any questions you have with your health care provider.  

## 2015-04-08 NOTE — Progress Notes (Signed)
Filed Vitals:   04/08/15 1508 04/08/15 1511 04/08/15 1516 04/08/15 1518  BP: 168/86 154/80 158/82 163/87  Pulse: 68 67 67 67  Temp:  98.5 F (36.9 C)    TempSrc:  Oral    Resp:  16    Height:  5' 6.5" (1.689 m)    Weight:  179 lb (81.194 kg)    SpO2:  96%

## 2015-04-08 NOTE — Progress Notes (Signed)
Established Carotid Patient   History of Present Illness  Tammy Brady is a 64 y.o. female patient that Dr. Kellie Simmering has been monitoring for mild carotid artery stenosis. She returns today for follow up.  Patient has not had previous carotid artery intervention.  The patient denies any history of TIA or stroke symptoms, specifically the patient denies a history of amaurosis fugax or monocular blindness, denies a history unilateral  of facial drooping, denies a history of hemiplegia, and denies a history of receptive or expressive aphasia.    The patient denies New Medical or Surgical History. Pt states she sees Dr. Julianne Handler yearly for mild CAD and vasospasm event in 2011.  Pt states her blood pressure at home is 130-140/80-90.  Pt states she has gained weight since she stopped going to the gym at lunch.  Pt Diabetic: no Pt smoker: former smoker, quit in 2010  Pt meds include: Statin : yes ASA: yes, 325 mg most days of the week Other anticoagulants/antiplatelets: no   Past Medical History  Diagnosis Date  . Hyperlipidemia   . Hypertension   . CAD (coronary artery disease)     Mild non-obstructive disease by cath January 2011  . Carotid artery disease     Followed by Dr Kellie Simmering  . Hypothyroidism   . GERD (gastroesophageal reflux disease)     Social History Social History  Substance Use Topics  . Smoking status: Former Smoker -- 1.00 packs/day for 40 years    Types: Cigarettes    Quit date: 05/11/2009  . Smokeless tobacco: Never Used  . Alcohol Use: 3.5 oz/week    7 Standard drinks or equivalent per week    Family History Family History  Problem Relation Age of Onset  . Cancer Mother   . Coronary artery disease Father   . Coronary artery disease Maternal Grandmother     Surgical History Past Surgical History  Procedure Laterality Date  . Appendectomy      Allergies  Allergen Reactions  . Codeine     Headache     Current Outpatient Prescriptions   Medication Sig Dispense Refill  . calcium carbonate (OS-CAL) 600 MG TABS Take 600 mg by mouth daily.    Marland Kitchen diltiazem (CARTIA XT) 240 MG 24 hr capsule Take 1 capsule (240 mg total) by mouth daily. 30 capsule 6  . levothyroxine (SYNTHROID, LEVOTHROID) 100 MCG tablet Take 100 mcg by mouth daily.    . Multiple Vitamin (MULTIVITAMIN) tablet Take 1 tablet by mouth daily.    . nitroGLYCERIN (NITROSTAT) 0.4 MG SL tablet Place 1 tablet (0.4 mg total) under the tongue every 5 (five) minutes as needed. 25 tablet 6  . pantoprazole (PROTONIX) 40 MG tablet Take 1 tablet (40 mg total) by mouth daily. 90 tablet 3  . simvastatin (ZOCOR) 40 MG tablet Take 40 mg by mouth at bedtime. Takes one and a half tablets daily     No current facility-administered medications for this visit.    Review of Systems : See HPI for pertinent positives and negatives.  Physical Examination  Filed Vitals:   04/08/15 1508 04/08/15 1511 04/08/15 1516 04/08/15 1518  BP: 168/86 154/80 158/82 163/87  Pulse: 68 67 67 67  Temp:  98.5 F (36.9 C)    TempSrc:  Oral    Resp:  16    Height:  5' 6.5" (1.689 m)    Weight:  179 lb (81.194 kg)    SpO2:  96%     Body mass index  is 28.46 kg/(m^2).  General: WDWN female in NAD GAIT: normal Eyes: PERRLA Pulmonary:  Non-labored, CTAB, no rales, no rhonchi, & no wheezing.  Cardiac: regular rhythm, no detected murmur.  VASCULAR EXAM Carotid Bruits Right Left   Negative Positive    Aorta is not palpable. Radial pulses are 2+ palpable and equal.                                                                                                                            LE Pulses Right Left       POPLITEAL  not palpable   not palpable       POSTERIOR TIBIAL   palpable    palpable        DORSALIS PEDIS      ANTERIOR TIBIAL  palpable   palpable     Gastrointestinal: soft, nontender, BS WNL, no r/g, no palpable masses.  Musculoskeletal: no muscle atrophy/wasting. M/S 5/5  throughout, extremities without ischemic changes.  Neurologic: A&O X 3; Appropriate Affect, Speech is normal CN 2-12 intact, pain and light touch intact in extremities, motor exam as listed above.   Non-Invasive Vascular Imaging CAROTID DUPLEX 04/08/2015   CEREBROVASCULAR DUPLEX EVALUATION    INDICATION: Carotid stenosis    PREVIOUS INTERVENTION(S): NA    DUPLEX EXAM:     RIGHT  LEFT  Peak Systolic Velocities (cm/s) End Diastolic Velocities (cm/s) Plaque LOCATION Peak Systolic Velocities (cm/s) End Diastolic Velocities (cm/s) Plaque  154 19  CCA PROXIMAL 118 17   82 18  CCA MID 90 19   64 17 HT CCA DISTAL 69 19 HT  119 15 HT ECA 123 15 HT  82 18 HT ICA PROXIMAL 147 42 HT  68 24  ICA MID 118 32   80 25  ICA DISTAL 102 31     1.0 ICA / CCA Ratio (PSV) 1.63  Antegrade Vertebral Flow Antegrade  NA Brachial Systolic Pressure (mmHg) NA  NA Brachial Artery Waveforms NA    Plaque Morphology:  HM = Homogeneous, HT = Heterogeneous, CP = Calcific Plaque, SP = Smooth Plaque, IP = Irregular Plaque     ADDITIONAL FINDINGS: Right subclavian artery PSV163cm/sec; left subclavian artery PSV 215cm/sec    IMPRESSION: Right internal carotid artery stenosis present in the less than 40% range. Left internal carotid artery stenosis present in the 40%-59% range.    Compared to the previous exam:  Essentially unchanged since previous study on 04/08/2013.      Assessment: Tammy Brady is a 64 y.o. female who has no history of stroke or TIA. Today's carotid duplex suggests less than 40% right ICA stenosis and 40%-59% left ICA stenosis. Essentially unchanged since previous study on 04/08/2013.   Plan: Follow-up in 1 year with Carotid Duplex.   I discussed in depth with the patient the nature of atherosclerosis, and emphasized the importance of maximal medical management including strict control of blood pressure, blood glucose, and lipid levels, obtaining  regular exercise, and continued  cessation of smoking.  The patient is aware that without maximal medical management the underlying atherosclerotic disease process will progress, limiting the benefit of any interventions. The patient was given information about stroke prevention and what symptoms should prompt the patient to seek immediate medical care. Thank you for allowing [place] to participate in this patient's care.  Clemon Chambers, RN, MSN, FNP-C [facility] Office: [phone]  Clinic Physician: Scot Dock  04/08/2015 3:23 PM

## 2015-04-14 ENCOUNTER — Other Ambulatory Visit (HOSPITAL_COMMUNITY): Payer: BC Managed Care – PPO

## 2015-04-14 ENCOUNTER — Ambulatory Visit: Payer: Self-pay | Admitting: Family

## 2015-09-03 DIAGNOSIS — Z131 Encounter for screening for diabetes mellitus: Secondary | ICD-10-CM | POA: Insufficient documentation

## 2015-11-26 DIAGNOSIS — F32A Depression, unspecified: Secondary | ICD-10-CM | POA: Insufficient documentation

## 2015-11-26 DIAGNOSIS — K219 Gastro-esophageal reflux disease without esophagitis: Secondary | ICD-10-CM | POA: Insufficient documentation

## 2015-11-26 DIAGNOSIS — G43909 Migraine, unspecified, not intractable, without status migrainosus: Secondary | ICD-10-CM | POA: Insufficient documentation

## 2016-04-06 ENCOUNTER — Encounter: Payer: Self-pay | Admitting: Family

## 2016-04-11 ENCOUNTER — Other Ambulatory Visit: Payer: Self-pay | Admitting: *Deleted

## 2016-04-11 DIAGNOSIS — I6523 Occlusion and stenosis of bilateral carotid arteries: Secondary | ICD-10-CM

## 2016-04-13 ENCOUNTER — Ambulatory Visit (INDEPENDENT_AMBULATORY_CARE_PROVIDER_SITE_OTHER): Payer: BLUE CROSS/BLUE SHIELD | Admitting: Family

## 2016-04-13 ENCOUNTER — Ambulatory Visit (HOSPITAL_COMMUNITY)
Admission: RE | Admit: 2016-04-13 | Discharge: 2016-04-13 | Disposition: A | Discharge status: Home / Self Care | Payer: BLUE CROSS/BLUE SHIELD | Source: Ambulatory Visit | Attending: Surgery | Admitting: Surgery

## 2016-04-13 ENCOUNTER — Encounter: Payer: Self-pay | Admitting: Family

## 2016-04-13 VITALS — BP 162/80 | HR 74 | Temp 97.8°F | Resp 12 | Ht 66.5 in | Wt 192.5 lb

## 2016-04-13 DIAGNOSIS — Z87891 Personal history of nicotine dependence: Secondary | ICD-10-CM

## 2016-04-13 DIAGNOSIS — I6523 Occlusion and stenosis of bilateral carotid arteries: Secondary | ICD-10-CM | POA: Diagnosis not present

## 2016-04-13 NOTE — Progress Notes (Signed)
Chief Complaint: Follow up Extracranial Carotid Artery Stenosis   History of Present Illness  Tammy Brady is a 65 y.o. female patient that Dr. Kellie Simmering has been monitoring for mild extracranial carotid artery stenosis. She returns today for follow up.  She has not had previous carotid artery intervention.  The patient denies any history of TIA or stroke symptoms, specifically she denies a history of amaurosis fugax or monocular blindness, unilateral facial drooping, hemiplegia, or receptive or expressive aphasia.    Pt states she sees Dr. Julianne Handler yearly for mild CAD and vasospasm event in 2011.  Pt states her blood pressure at home is 130-140/80-90.  Pt states she has gained weight due to recent family stress.   Pt Diabetic: no Pt smoker: former smoker, quit in 2010, smoked x 40 years.  Pt meds include: Statin : yes ASA: yes, 325 mg most days of the week Other anticoagulants/antiplatelets: no   Past Medical History:  Diagnosis Date  . CAD (coronary artery disease)    Mild non-obstructive disease by cath January 2011  . Carotid artery disease ([place])    Followed by Dr Kellie Simmering  . GERD (gastroesophageal reflux disease)   . Hyperlipidemia   . Hypertension   . Hypothyroidism     Social History Social History  Substance Use Topics  . Smoking status: Former Smoker    Packs/day: 1.00    Years: 40.00    Types: Cigarettes    Quit date: 05/11/2009  . Smokeless tobacco: Never Used  . Alcohol use 3.5 oz/week    7 Standard drinks or equivalent per week    Family History Family History  Problem Relation Age of Onset  . Cancer Mother   . Coronary artery disease Father   . Coronary artery disease Maternal Grandmother     Surgical History Past Surgical History:  Procedure Laterality Date  . APPENDECTOMY      Allergies  Allergen Reactions  . Codeine     Headache     Current Outpatient Prescriptions  Medication Sig Dispense Refill  . calcium carbonate  (OS-CAL) 600 MG TABS Take 600 mg by mouth daily.    Marland Kitchen levothyroxine (SYNTHROID, LEVOTHROID) 100 MCG tablet Take 100 mcg by mouth daily.    . Multiple Vitamin (MULTIVITAMIN) tablet Take 1 tablet by mouth daily.    . nitroGLYCERIN (NITROSTAT) 0.4 MG SL tablet Place 1 tablet (0.4 mg total) under the tongue every 5 (five) minutes as needed. 25 tablet 6  . simvastatin (ZOCOR) 40 MG tablet Take 40 mg by mouth at bedtime. Takes one and a half tablets daily    . diltiazem (CARTIA XT) 240 MG 24 hr capsule Take 1 capsule (240 mg total) by mouth daily. 30 capsule 6  . pantoprazole (PROTONIX) 40 MG tablet Take 1 tablet (40 mg total) by mouth daily. (Patient not taking: Reported on 04/13/2016) 90 tablet 3   No current facility-administered medications for this visit.     Review of Systems : See HPI for pertinent positives and negatives.  Physical Examination  Vitals:   04/13/16 1505 04/13/16 1507  BP: (!) 161/84 (!) 162/80  Pulse: 74   Resp: 12   Temp: 97.8 F (36.6 C)   TempSrc: Oral   SpO2: 95%   Weight: 192 lb 8 oz (87.3 kg)   Height: 5' 6.5" (1.689 m)    Body mass index is 30.6 kg/m.  General: WDWN obese female in NAD GAIT: normal Eyes: PERRLA Pulmonary: Respirations are non-labored, CTAB, good air  movement in all fields.  Cardiac: regular rhythm and rate, no detected murmur.  VASCULAR EXAM Carotid Bruits Right Left   Negative Positive    Aorta is not palpable. Radial pulses are 2+ palpable and equal.                                                                                                                                          LE Pulses Right Left       POPLITEAL  not palpable  not palpable       POSTERIOR TIBIAL   palpable   palpable       DORSALIS PEDIS      ANTERIOR TIBIAL  palpable  palpable    Gastrointestinal: soft, nontender, BS WNL, no r/g, no palpable masses.  Musculoskeletal: no muscle atrophy/wasting. M/S 5/5 throughout, extremities without ischemic  changes.  Neurologic: A&O X 3; Appropriate Affect, Speech is normal CN 2-12 intact, pain and light touch intact in extremities, motor exam as listed above.     Assessment: Tammy Brady is a 65 y.o. female who has no history of stroke or TIA.   DATA Today's carotid duplex suggests less than 40% right ICA stenosis and 40%-59% left ICA stenosis. Essentially unchanged since previous studies on 04/08/2013 and 04/08/15.   Plan: Follow-up in 1 year with Carotid Duplex scan.   I discussed in depth with the patient the nature of atherosclerosis, and emphasized the importance of maximal medical management including strict control of blood pressure, blood glucose, and lipid levels, obtaining regular exercise, and continued cessation of smoking.  The patient is aware that without maximal medical management the underlying atherosclerotic disease process will progress, limiting the benefit of any interventions. The patient was given information about stroke prevention and what symptoms should prompt the patient to seek immediate medical care. Thank you for allowing [place] to participate in this patient's care.  Clemon Chambers, RN, MSN, FNP-C [facility] Office: [facility] Physician: Trula Slade  04/13/16 3:09 PM

## 2016-04-13 NOTE — Patient Instructions (Signed)
Stroke Prevention Some medical conditions and behaviors are associated with an increased chance of having a stroke. You may prevent a stroke by making healthy choices and managing medical conditions. HOW CAN I REDUCE MY RISK OF HAVING A STROKE?   Stay physically active. Get at least 30 minutes of activity on most or all days.  Do not smoke. It may also be helpful to avoid exposure to secondhand smoke.  Limit alcohol use. Moderate alcohol use is considered to be:  No more than 2 drinks per day for men.  No more than 1 drink per day for nonpregnant women.  Eat healthy foods. This involves:  Eating 5 or more servings of fruits and vegetables a day.  Making dietary changes that address high blood pressure (hypertension), high cholesterol, diabetes, or obesity.  Manage your cholesterol levels.  Making food choices that are high in fiber and low in saturated fat, trans fat, and cholesterol may control cholesterol levels.  Take any prescribed medicines to control cholesterol as directed by your health care provider.  Manage your diabetes.  Controlling your carbohydrate and sugar intake is recommended to manage diabetes.  Take any prescribed medicines to control diabetes as directed by your health care provider.  Control your hypertension.  Making food choices that are low in salt (sodium), saturated fat, trans fat, and cholesterol is recommended to manage hypertension.  Ask your health care provider if you need treatment to lower your blood pressure. Take any prescribed medicines to control hypertension as directed by your health care provider.  If you are 18-39 years of age, have your blood pressure checked every 3-5 years. If you are 40 years of age or older, have your blood pressure checked every year.  Maintain a healthy weight.  Reducing calorie intake and making food choices that are low in sodium, saturated fat, trans fat, and cholesterol are recommended to manage  weight.  Stop drug abuse.  Avoid taking birth control pills.  Talk to your health care provider about the risks of taking birth control pills if you are over 35 years old, smoke, get migraines, or have ever had a blood clot.  Get evaluated for sleep disorders (sleep apnea).  Talk to your health care provider about getting a sleep evaluation if you snore a lot or have excessive sleepiness.  Take medicines only as directed by your health care provider.  For some people, aspirin or blood thinners (anticoagulants) are helpful in reducing the risk of forming abnormal blood clots that can lead to stroke. If you have the irregular heart rhythm of atrial fibrillation, you should be on a blood thinner unless there is a good reason you cannot take them.  Understand all your medicine instructions.  Make sure that other conditions (such as anemia or atherosclerosis) are addressed. SEEK IMMEDIATE MEDICAL CARE IF:   You have sudden weakness or numbness of the face, arm, or leg, especially on one side of the body.  Your face or eyelid droops to one side.  You have sudden confusion.  You have trouble speaking (aphasia) or understanding.  You have sudden trouble seeing in one or both eyes.  You have sudden trouble walking.  You have dizziness.  You have a loss of balance or coordination.  You have a sudden, severe headache with no known cause.  You have new chest pain or an irregular heartbeat. Any of these symptoms may represent a serious problem that is an emergency. Do not wait to see if the symptoms will   go away. Get medical help at once. Call your local emergency services (911 in [place] Do not drive yourself to the hospital.   This information is not intended to replace advice given to you by your health care provider. Make sure you discuss any questions you have with your health care provider.   Document Released: 08/04/2004 Document Revised: 07/18/2014 Document Reviewed:  12/28/2012 Elsevier Interactive Patient Education 2016 [employer]  

## 2016-05-27 NOTE — Addendum Note (Signed)
Addended by: Lianne Cure A on: 05/27/2016 03:19 PM   Modules accepted: Orders

## 2016-06-20 ENCOUNTER — Encounter: Payer: Self-pay | Admitting: Cardiovascular Disease

## 2016-06-20 ENCOUNTER — Ambulatory Visit (INDEPENDENT_AMBULATORY_CARE_PROVIDER_SITE_OTHER): Payer: BLUE CROSS/BLUE SHIELD | Admitting: Cardiovascular Disease

## 2016-06-20 VITALS — BP 152/96 | HR 65 | Ht 66.0 in | Wt 179.8 lb

## 2016-06-20 DIAGNOSIS — I2510 Atherosclerotic heart disease of native coronary artery without angina pectoris: Secondary | ICD-10-CM | POA: Diagnosis not present

## 2016-06-20 MED ORDER — NITROGLYCERIN 0.4 MG SL SUBL: 0.4000 mg | SUBLINGUAL_TABLET | SUBLINGUAL | 6 refills | Status: DC | PRN

## 2016-06-20 MED ORDER — DILTIAZEM HCL ER COATED BEADS 240 MG PO CP24: 240.0000 mg | ORAL_CAPSULE | Freq: Every day | ORAL | 3 refills | Status: DC

## 2016-06-20 NOTE — Patient Instructions (Signed)
Medication Instructions:  Your physician recommends that you continue on your current medications as directed. Please refer to the Current Medication list given to you today.   Labwork: none  Testing/Procedures: None   Follow-Up: Your physician recommends that you schedule a follow-up appointment in: 12 months. Please call our office in about 9 months to schedule this appointment    Any Other Special Instructions Will Be Listed Below (If Applicable).     If you need a refill on your cardiac medications before your next appointment, please call your pharmacy.   

## 2016-06-20 NOTE — Progress Notes (Signed)
Chief Complaint  Patient presents with  . Follow-up     History of Present Illness: 65 yo WF with history of HTN, hyperlipidemia, former tobacco abuse, carotid artery disease and CAD here today for cardiac follow up. She was admitted to [facility] in January 2011 with a NSTEMI. Cardiac cath 07/16/09 with 20% LAD stenosis, 20% diffuse RCA stenosis, no Circumflex disease. Her event was felt to be secondary to coronary vasospasm. She has been treated with Cardizem and uses SL NTG prn. She stopped smoking in November 2010. She has known carotid artery disease followed by Dr. Kellie Simmering. She has GERD treated with Protonix.   She is here today for follow up. She denies chest pain, SOB, near syncope or syncope. She does not exercise.   Primary Care Physician: Teressa Lower, MD   Past Medical History:  Diagnosis Date  . CAD (coronary artery disease)    Mild non-obstructive disease by cath January 2011  . Carotid artery disease ([place])    Followed by Dr Kellie Simmering  . GERD (gastroesophageal reflux disease)   . Hyperlipidemia   . Hypertension   . Hypothyroidism     Past Surgical History:  Procedure Laterality Date  . APPENDECTOMY      Current Outpatient Prescriptions  Medication Sig Dispense Refill  . calcium carbonate (OS-CAL) 600 MG TABS Take 600 mg by mouth daily.    Marland Kitchen levothyroxine (SYNTHROID, LEVOTHROID) 100 MCG tablet Take 100 mcg by mouth daily.    . Multiple Vitamin (MULTIVITAMIN) tablet Take 1 tablet by mouth daily.    . nitroGLYCERIN (NITROSTAT) 0.4 MG SL tablet Place 1 tablet (0.4 mg total) under the tongue every 5 (five) minutes as needed. 25 tablet 6  . simvastatin (ZOCOR) 40 MG tablet Take 40 mg by mouth at bedtime.     Marland Kitchen diltiazem (CARTIA XT) 240 MG 24 hr capsule Take 1 capsule (240 mg total) by mouth daily. 90 capsule 3   No current facility-administered medications for this visit.     Allergies  Allergen Reactions  . Codeine     Headache     Social History    Social History  . Marital status: Married    Spouse name: N/A  . Number of children: N/A  . Years of education: N/A   Occupational History  . [employer]roller [employer]   Social History Main Topics  . Smoking status: Former Smoker    Packs/day: 1.00    Years: 40.00    Types: Cigarettes    Quit date: 05/11/2009  . Smokeless tobacco: Never Used  . Alcohol use 3.5 oz/week    7 Standard drinks or equivalent per week  . Drug use: No  . Sexual activity: Not on file   Other Topics Concern  . Not on file   Social History Narrative  . No narrative on file    Family History  Problem Relation Age of Onset  . Cancer Mother   . Coronary artery disease Father   . Coronary artery disease Maternal Grandmother     Review of Systems:  As stated in the HPI and otherwise negative.   BP (!) 152/96   Pulse 65   Ht 5\' 6"  (1.676 m)   Wt 179 lb 12.8 oz (81.6 kg)   SpO2 98%   BMI 29.02 kg/m   Physical Examination: General: Well developed, well nourished, NAD  HEENT: OP clear, mucus membranes moist  SKIN: warm, dry. No rashes. Neuro: No focal deficits  Musculoskeletal: Muscle strength 5/5  all ext  Psychiatric: Mood and affect normal  Neck: No JVD, left carotid bruit,  no thyromegaly, no lymphadenopathy.  Lungs:Clear bilaterally, no wheezes, rhonci, crackles Cardiovascular: Regular rate and rhythm. No murmurs, gallops or rubs. Abdomen:Soft. Bowel sounds present. Non-tender.  Extremities: No lower extremity edema. Pulses are 2 + in the bilateral DP/PT.  EKG:  EKG is ordered today. The ekg ordered today demonstrates NSR, rate 62 bpm.   Recent Labs: No results found for requested labs within last 8760 hours.     Wt Readings from Last 3 Encounters:  06/20/16 179 lb 12.8 oz (81.6 kg)  04/13/16 192 lb 8 oz (87.3 kg)  04/08/15 179 lb (81.2 kg)     Other studies Reviewed: Additional studies/ records that were reviewed today include: . Review of the above records demonstrates:     Assessment and Plan:   1. CAD without angina: She is known to have mild non-obstructive CAD but did have vasospasm as the cause of her NSTEMI in January 2011. She has been on Cardizem and doing well. Refill NTG and Cardizem today.    2. GERD: No symptoms. She has stopped Protonix.   3. Carotid artery disease: Moderate bilateral disease. Followed in [facility].    Current medicines are reviewed at length with the patient today.  The patient does not have concerns regarding medicines.  The following changes have been made:  no change  Labs/ tests ordered today include:   Orders Placed This Encounter  Procedures  . EKG 12-Lead    Disposition:   FU with me in 12  months  Signed, Lauree Chandler, MD 06/20/2016 9:02 AM    [facility] [address], [place], [place]  [address] Phone: [phone]; Fax: [phone]

## 2017-04-17 ENCOUNTER — Encounter: Payer: Self-pay | Admitting: Family

## 2017-04-17 ENCOUNTER — Ambulatory Visit (HOSPITAL_COMMUNITY)
Admission: RE | Admit: 2017-04-17 | Discharge: 2017-04-17 | Disposition: A | Discharge status: Home / Self Care | Payer: BLUE CROSS/BLUE SHIELD | Source: Ambulatory Visit | Attending: Family | Admitting: Family

## 2017-04-17 ENCOUNTER — Ambulatory Visit (INDEPENDENT_AMBULATORY_CARE_PROVIDER_SITE_OTHER): Payer: BLUE CROSS/BLUE SHIELD | Admitting: Family

## 2017-04-17 VITALS — BP 169/83 | HR 66 | Resp 16 | Ht 66.0 in | Wt 178.0 lb

## 2017-04-17 DIAGNOSIS — Z87891 Personal history of nicotine dependence: Secondary | ICD-10-CM | POA: Diagnosis not present

## 2017-04-17 DIAGNOSIS — I6523 Occlusion and stenosis of bilateral carotid arteries: Secondary | ICD-10-CM | POA: Diagnosis present

## 2017-04-17 LAB — VAS US CAROTID
LCCAPDIAS: 17 cm/s
LCCAPSYS: 75 cm/s
LEFT ECA DIAS: -19 cm/s
LEFT VERTEBRAL DIAS: -18 cm/s
LICADSYS: -85 cm/s
Left CCA dist dias: 22 cm/s
Left CCA dist sys: 76 cm/s
Left ICA dist dias: -32 cm/s
Left ICA prox dias: -49 cm/s
Left ICA prox sys: -159 cm/s
RCCAPDIAS: 15 cm/s
RCCAPSYS: 79 cm/s
RIGHT CCA MID DIAS: 17 cm/s
RIGHT ECA DIAS: -13 cm/s
RIGHT VERTEBRAL DIAS: -20 cm/s
Right cca dist sys: -107 cm/s

## 2017-04-17 NOTE — Patient Instructions (Signed)
Stroke Prevention Some medical conditions and behaviors are associated with an increased chance of having a stroke. You may prevent a stroke by making healthy choices and managing medical conditions. How can I reduce my risk of having a stroke?  Stay physically active. Get at least 30 minutes of activity on most or all days.  Do not smoke. It may also be helpful to avoid exposure to secondhand smoke.  Limit alcohol use. Moderate alcohol use is considered to be: ? No more than 2 drinks per day for men. ? No more than 1 drink per day for nonpregnant women.  Eat healthy foods. This involves: ? Eating 5 or more servings of fruits and vegetables a day. ? Making dietary changes that address high blood pressure (hypertension), high cholesterol, diabetes, or obesity.  Manage your cholesterol levels. ? Making food choices that are high in fiber and low in saturated fat, trans fat, and cholesterol may control cholesterol levels. ? Take any prescribed medicines to control cholesterol as directed by your health care provider.  Manage your diabetes. ? Controlling your carbohydrate and sugar intake is recommended to manage diabetes. ? Take any prescribed medicines to control diabetes as directed by your health care provider.  Control your hypertension. ? Making food choices that are low in salt (sodium), saturated fat, trans fat, and cholesterol is recommended to manage hypertension. ? Ask your health care provider if you need treatment to lower your blood pressure. Take any prescribed medicines to control hypertension as directed by your health care provider. ? If you are 18-39 years of age, have your blood pressure checked every 3-5 years. If you are 40 years of age or older, have your blood pressure checked every year.  Maintain a healthy weight. ? Reducing calorie intake and making food choices that are low in sodium, saturated fat, trans fat, and cholesterol are recommended to manage  weight.  Stop drug abuse.  Avoid taking birth control pills. ? Talk to your health care provider about the risks of taking birth control pills if you are over 35 years old, smoke, get migraines, or have ever had a blood clot.  Get evaluated for sleep disorders (sleep apnea). ? Talk to your health care provider about getting a sleep evaluation if you snore a lot or have excessive sleepiness.  Take medicines only as directed by your health care provider. ? For some people, aspirin or blood thinners (anticoagulants) are helpful in reducing the risk of forming abnormal blood clots that can lead to stroke. If you have the irregular heart rhythm of atrial fibrillation, you should be on a blood thinner unless there is a good reason you cannot take them. ? Understand all your medicine instructions.  Make sure that other conditions (such as anemia or atherosclerosis) are addressed. Get help right away if:  You have sudden weakness or numbness of the face, arm, or leg, especially on one side of the body.  Your face or eyelid droops to one side.  You have sudden confusion.  You have trouble speaking (aphasia) or understanding.  You have sudden trouble seeing in one or both eyes.  You have sudden trouble walking.  You have dizziness.  You have a loss of balance or coordination.  You have a sudden, severe headache with no known cause.  You have new chest pain or an irregular heartbeat. Any of these symptoms may represent a serious problem that is an emergency. Do not wait to see if the symptoms will go away.   Get medical help at once. Call your local emergency services (911 in [place] Do not drive yourself to the hospital. This information is not intended to replace advice given to you by your health care provider. Make sure you discuss any questions you have with your health care provider. Document Released: 08/04/2004 Document Revised: 12/03/2015 Document Reviewed: 12/28/2012 Elsevier  Interactive Patient Education  2017 Elsevier Inc.     Preventing Cerebrovascular Disease Arteries are blood vessels that carry blood that contains oxygen from the heart to all parts of the body. Cerebrovascular disease affects arteries that supply the brain. Any condition that blocks or disrupts blood flow to the brain can cause cerebrovascular disease. Brain cells that lose blood supply start to die within minutes (stroke). Stroke is the main danger of cerebrovascular disease. Atherosclerosis and high blood pressure are common causes of cerebrovascular disease. Atherosclerosis is narrowing and hardening of an artery that results when fat, cholesterol, calcium, or other substances (plaque) build up inside an artery. Plaque reduces blood flow through the artery. High blood pressure increases the risk of bleeding inside the brain. Making diet and lifestyle changes to prevent atherosclerosis and high blood pressure lowers your risk of cerebrovascular disease. What nutrition changes can be made?  Eat more fruits, vegetables, and whole grains.  Reduce how much saturated fat you eat. To do this, eat less red meat and fewer full-fat dairy products.  Eat healthy proteins instead of red meat. Healthy proteins include: ? Fish. Eat fish that contains heart-healthy omega-3 fatty acids, twice a week. Examples include salmon, albacore tuna, mackerel, and herring. ? Chicken. ? Nuts. ? Low-fat or nonfat yogurt.  Avoid processed meats, like bacon and lunchmeat.  Avoid foods that contain: ? A lot of sugar, such as sweets and drinks with added sugar. ? A lot of salt (sodium). Avoid adding extra salt to your food, as told by your health care provider. ? Trans fats, such as margarine and baked goods. Trans fats may be listed as "partially hydrogenated oils" on food labels.  Check food labels to see how much sodium, sugar, and trans fats are in foods.  Use vegetable oils that contain low amounts of  saturated fat, such as olive oil or canola oil. What lifestyle changes can be made?  Drink alcohol in moderation. This means no more than 1 drink a day for nonpregnant women and 2 drinks a day for men. One drink equals 12 oz of beer, 5 oz of wine, or 1 oz of hard liquor.  If you are overweight, ask your health care provider to recommend a weight-loss plan for you. Losing 5-10 lb (2.2-4.5 kg) can reduce your risk of diabetes, atherosclerosis, and high blood pressure.  Exercise for 30?60 minutes on most days, or as much as told by your health care provider. ? Do moderate-intensity exercise, such as brisk walking, bicycling, and water aerobics. Ask your health care provider which activities are safe for you.  Do not use any products that contain nicotine or tobacco, such as cigarettes and e-cigarettes. If you need help quitting, ask your health care provider. Why are these changes important? Making these changes lowers your risk of many diseases that can cause cerebrovascular disease and stroke. Stroke is a leading cause of death and disability. Making these changes also improves your overall health and quality of life. What can I do to lower my risk? The following factors make you more likely to develop cerebrovascular disease:  Being overweight.  Smoking.  Being physically inactive.    Eating a high-fat diet.  Having certain health conditions, such as: ? Diabetes. ? High blood pressure. ? Heart disease. ? Atherosclerosis. ? High cholesterol. ? Sickle cell disease.  Talk with your health care provider about your risk for cerebrovascular disease. Work with your health care provider to control diseases that you have that may contribute to cerebrovascular disease. Your health care provider may prescribe medicines to help prevent major causes of cerebrovascular disease. Where to find more information: Learn more about preventing cerebrovascular disease from:  National Heart, Lung, and  Blood Institute: www.nhlbi.nih.gov/health/health-topics/topics/stroke  Centers for Disease Control and Prevention: cdc.gov/stroke/about.htm  Summary  Cerebrovascular disease can lead to a stroke.  Atherosclerosis and high blood pressure are major causes of cerebrovascular disease.  Making diet and lifestyle changes can reduce your risk of cerebrovascular disease.  Work with your health care provider to get your risk factors under control to reduce your risk of cerebrovascular disease. This information is not intended to replace advice given to you by your health care provider. Make sure you discuss any questions you have with your health care provider. Document Released: 07/12/2015 Document Revised: 01/15/2016 Document Reviewed: 07/12/2015 Elsevier Interactive Patient Education  2018 Elsevier Inc.  

## 2017-04-17 NOTE — Progress Notes (Signed)
Chief Complaint: Follow up Extracranial Carotid Artery Stenosis   History of Present Illness  Tammy Brady is a 66 y.o. female patient that Dr. Kellie Brady has been monitoring for mild extracranial carotid artery stenosis. She returns today for follow up.  She has nothad previous carotid artery intervention.  The patient denies any history of TIA or stroke symptoms, specifically she denies a history of amaurosis fugax or monocular blindness, unilateral facial drooping, hemiplegia, or receptive or expressive aphasia.   Pt states she sees Dr. Julianne Brady yearly for mild CAD and vasospasm event in 2011.  Pt states she is going through stress at work, and has a bad headache, states she has mild headaches daily, and migraine headaches 4-5x/month.  She states her blood pressure always increases when she has a headaches.  Pt Diabetic: no Pt smoker: former smoker, quit in 2010, smoked x 40 years.  Pt meds include: Statin : yes ASA: yes, 325 mg most days of the week Other anticoagulants/antiplatelets: no   Past Medical History:  Diagnosis Date  . CAD (coronary artery disease)    Mild non-obstructive disease by cath January 2011  . Carotid artery disease ([place])    Followed by Dr Tammy Brady  . GERD (gastroesophageal reflux disease)   . Hyperlipidemia   . Hypertension   . Hypothyroidism     Social History Social History  Substance Use Topics  . Smoking status: Former Smoker    Packs/day: 1.00    Years: 40.00    Types: Cigarettes    Quit date: 05/11/2009  . Smokeless tobacco: Never Used  . Alcohol use 3.5 oz/week    7 Standard drinks or equivalent per week    Family History Family History  Problem Relation Age of Onset  . Cancer Mother   . Coronary artery disease Father   . Coronary artery disease Maternal Grandmother     Surgical History Past Surgical History:  Procedure Laterality Date  . APPENDECTOMY      Allergies  Allergen Reactions  . Codeine     Headache      Current Outpatient Prescriptions  Medication Sig Dispense Refill  . calcium carbonate (OS-CAL) 600 MG TABS Take 600 mg by mouth daily.    Marland Kitchen diltiazem (CARTIA XT) 240 MG 24 hr capsule Take 1 capsule (240 mg total) by mouth daily. 90 capsule 3  . levothyroxine (SYNTHROID, LEVOTHROID) 100 MCG tablet Take 100 mcg by mouth daily.    . Multiple Vitamin (MULTIVITAMIN) tablet Take 1 tablet by mouth daily.    . nitroGLYCERIN (NITROSTAT) 0.4 MG SL tablet Place 1 tablet (0.4 mg total) under the tongue every 5 (five) minutes as needed. 25 tablet 6  . simvastatin (ZOCOR) 40 MG tablet Take 40 mg by mouth at bedtime.      No current facility-administered medications for this visit.     Review of Systems : See HPI for pertinent positives and negatives.  Physical Examination  Vitals:   04/17/17 1325 04/17/17 1327  BP: (!) 161/86 (!) 169/83  Pulse: 66   Resp: 16   SpO2: 96%   Weight: 178 lb (80.7 kg)   Height: 5\' 6"  (1.676 m)    Body mass index is 28.73 kg/m.  General: WDWN female in NAD GAIT:normal Eyes: PERRLA Pulmonary: Respirations are non-labored, CTAB, good air movement in all fields.  Cardiac: regular rhythm and rate, no detected murmur.  VASCULAR EXAM Carotid Bruits Right Left   Negative Positive   Abdominal aortic pulse is not palpable. Radial  pulses are 2+ palpable and equal.   LE Pulses Right Left  POPLITEAL not palpable not palpable  POSTERIOR TIBIAL palpable palpable  DORSALIS PEDIS ANTERIOR TIBIAL palpable palpable    Gastrointestinal: soft, nontender, BS WNL, no r/g, no palpable masses.  Musculoskeletal: no muscle atrophy/wasting. M/S 5/5 throughout, extremities without ischemic changes.  Neurologic: A&O X 3; appropriate affect, speech is normal, CN 2-12 intact, pain and light touch intact in extremities, motor exam as listed above    Assessment: Tammy Brady is a 66 y.o. female who has no  history of stroke or TIA.   DATA Carotid Duplex (04/17/17):  Less than 40%right ICA stenosis. 40%-59%left ICA stenosis. Bilateral vertebral artery flow is antegrade.  Bilateral subclavian artery waveforms are normal.  Essentially unchanged since previous studies on 04/08/2013, 04/08/15, and 04/13/16.   Plan: Follow-up in 18 months with Carotid Duplex scan   I discussed in depth with the patient the nature of atherosclerosis, and emphasized the importance of maximal medical management including strict control of blood pressure, blood glucose, and lipid levels, obtaining regular exercise, and continued cessation of smoking.  The patient is aware that without maximal medical management the underlying atherosclerotic disease process will progress, limiting the benefit of any interventions. The patient was given information about stroke prevention and what symptoms should prompt the patient to seek immediate medical care. Thank you for allowing [place] to participate in this patient's care.  Tammy Chambers, RN, MSN, FNP-C [facility] Office: [phone]  Clinic Physician: Tammy Brady  04/17/17 1:43 PM

## 2018-02-07 ENCOUNTER — Encounter

## 2018-04-09 ENCOUNTER — Encounter: Payer: Self-pay | Admitting: Physician Assistant

## 2018-04-09 LAB — HM COLONOSCOPY

## 2019-08-29 ENCOUNTER — Encounter: Payer: Self-pay | Admitting: Physician Assistant

## 2019-10-21 ENCOUNTER — Other Ambulatory Visit: Payer: Self-pay | Admitting: Physician Assistant

## 2019-10-21 MED ORDER — DILTIAZEM HCL ER COATED BEADS 360 MG PO CP24: 360.0000 mg | ORAL_CAPSULE | Freq: Every day | ORAL | 0 refills | Status: DC

## 2020-01-08 ENCOUNTER — Other Ambulatory Visit: Payer: Self-pay | Admitting: Physician Assistant

## 2020-01-28 ENCOUNTER — Other Ambulatory Visit: Payer: Self-pay

## 2020-01-28 ENCOUNTER — Encounter: Payer: Self-pay | Admitting: Physician Assistant

## 2020-01-28 ENCOUNTER — Ambulatory Visit (INDEPENDENT_AMBULATORY_CARE_PROVIDER_SITE_OTHER): Payer: PRIVATE HEALTH INSURANCE | Admitting: Physician Assistant

## 2020-01-28 VITALS — BP 110/82 | HR 64 | Temp 97.0°F | Ht 66.0 in | Wt 174.0 lb

## 2020-01-28 DIAGNOSIS — E038 Other specified hypothyroidism: Secondary | ICD-10-CM

## 2020-01-28 DIAGNOSIS — I10 Essential (primary) hypertension: Secondary | ICD-10-CM | POA: Diagnosis not present

## 2020-01-28 DIAGNOSIS — E782 Mixed hyperlipidemia: Secondary | ICD-10-CM | POA: Diagnosis not present

## 2020-01-28 MED ORDER — SIMVASTATIN 40 MG PO TABS: 40.0000 mg | ORAL_TABLET | Freq: Every day | ORAL | 1 refills | Status: DC

## 2020-01-28 MED ORDER — DILTIAZEM HCL ER COATED BEADS 360 MG PO CP24: 360.0000 mg | ORAL_CAPSULE | Freq: Every day | ORAL | 1 refills | Status: DC

## 2020-01-28 NOTE — Assessment & Plan Note (Signed)
Well controlled.  ?No changes to medicines.  ?Continue to work on eating a healthy diet and exercise.  ?Labs drawn today.  ?

## 2020-01-28 NOTE — Assessment & Plan Note (Signed)
labwork pending ?Continue meds ?

## 2020-01-28 NOTE — Progress Notes (Signed)
Established Patient Office Visit  Subjective:  Patient ID: Tammy Brady, female    DOB: 13-Jun-1951  Age: 69 y.o. MRN: [id number]  CC:  Chief Complaint  Patient presents with  . Hyperlipidemia    HPI Tammy Brady presents for chronic follow up     Follow up of other specified hypothyroidism.  currently taking levothyroxine 112 mcg qd due to check tsh    Pt presents with hyperlipidemia.  Compliance with treatment has been good; she takes her medication as directed, maintains her low cholesterol diet, follows up as directed, and maintains her exercise regimen.  She denies experiencing any hypercholesterolemia related symptoms.  Currently on zocor 40mg  qd    Pt presents for follow up of hypertension.  She is tolerating the medication well without side effects.  Compliance with treatment has been good; she takes her medication as directed, maintains her diet and exercise regimen, and follows up as directed.  Currently on cardizem 360mg  qd  Past Medical History:  Diagnosis Date  . CAD (coronary artery disease)    Mild non-obstructive disease by cath January 2011  . Carotid artery disease ([place])    Followed by Dr Kellie Simmering  . GERD (gastroesophageal reflux disease)   . Hyperlipidemia   . Hypertension   . Hypothyroidism     Past Surgical History:  Procedure Laterality Date  . APPENDECTOMY      Family History  Problem Relation Age of Onset  . Cancer Mother   . Coronary artery disease Father   . Coronary artery disease Maternal Grandmother     Social History   Socioeconomic History  . Marital status: Married    Spouse name: Not on file  . Number of children: Not on file  . Years of education: Not on file  . Highest education level: Not on file  Occupational History  . Occupation: Field seismologist: [employer]  Tobacco Use  . Smoking status: Former Smoker    Packs/day: 1.00    Years: 40.00    Pack years: 40.00    Types: Cigarettes    Quit date: 05/11/2009     Years since quitting: 10.7  . Smokeless tobacco: Never Used  Substance and Sexual Activity  . Alcohol use: Yes    Alcohol/week: 7.0 standard drinks    Types: 7 Standard drinks or equivalent per week  . Drug use: No  . Sexual activity: Not on file  Other Topics Concern  . Not on file  Social History Narrative  . Not on file   Social Determinants of Health   Financial Resource Strain:   . Difficulty of Paying Living Expenses:   Food Insecurity:   . Worried About Charity fundraiser in the Last Year:   . Arboriculturist in the Last Year:   Transportation Needs:   . Film/video editor (Medical):   Marland Kitchen Lack of Transportation (Non-Medical):   Physical Activity:   . Days of Exercise per Week:   . Minutes of Exercise per Session:   Stress:   . Feeling of Stress :   Social Connections:   . Frequency of Communication with Friends and Family:   . Frequency of Social Gatherings with Friends and Family:   . Attends Religious Services:   . Active Member of Clubs or Organizations:   . Attends Archivist Meetings:   Marland Kitchen Marital Status:   Intimate Partner Violence:   . Fear of Current or Ex-Partner:   .  Emotionally Abused:   Marland Kitchen Physically Abused:   . Sexually Abused:      Current Outpatient Medications:  .  calcium carbonate (OS-CAL) 600 MG TABS, Take 600 mg by mouth daily., Disp: , Rfl:  .  diltiazem (CARDIZEM CD) 360 MG 24 hr capsule, Take 1 capsule (360 mg total) by mouth daily., Disp: 90 capsule, Rfl: 0 .  levothyroxine (SYNTHROID) 112 MCG tablet, Take 112 mcg by mouth daily., Disp: , Rfl:  .  Multiple Vitamin (MULTIVITAMIN) tablet, Take 1 tablet by mouth daily., Disp: , Rfl:  .  nitroGLYCERIN (NITROSTAT) 0.4 MG SL tablet, Place 1 tablet (0.4 mg total) under the tongue every 5 (five) minutes as needed., Disp: 25 tablet, Rfl: 6 .  simvastatin (ZOCOR) 40 MG tablet, Take 40 mg by mouth at bedtime. , Disp: , Rfl:    Allergies  Allergen Reactions  . Codeine Other (See  Comments)    Headache  HEADACHE    ROS CONSTITUTIONAL: Negative for chills, fatigue, fever, unintentional weight gain and unintentional weight loss.  E/N/T: Negative for ear pain, nasal congestion and sore throat.  CARDIOVASCULAR: Negative for chest pain, dizziness, palpitations and pedal edema.  RESPIRATORY: Negative for recent cough and dyspnea.  GASTROINTESTINAL: Negative for abdominal pain, acid reflux symptoms, constipation, diarrhea, nausea and vomiting.  MSK: Negative for arthralgias and myalgias.  INTEGUMENTARY: Negative for rash.  NEUROLOGICAL: Negative for dizziness and headaches.  PSYCHIATRIC: Negative for sleep disturbance and to question depression screen.  Negative for depression, negative for anhedonia.        Objective:    PHYSICAL EXAM:   VS: BP 110/82 (BP Location: Left Arm, Patient Position: Sitting)   Pulse 64   Temp (!) 97 F (36.1 C) (Temporal)   Ht 5\' 6"  (1.676 m)   Wt 174 lb (78.9 kg)   SpO2 99%   BMI 28.08 kg/m   GEN: Well nourished, well developed, in no acute distress  Cardiac: RRR; no murmurs, rubs, or gallops,no edema -  Respiratory:  normal respiratory rate and pattern with no distress - normal breath sounds with no rales, rhonchi, wheezes or rubs  Skin: warm and dry, no rash  Neuro:  Alert and Oriented x 3, Strength and sensation are intact - CN II-Xii grossly intact Psych: euthymic mood, appropriate affect and demeanor  BP 110/82 (BP Location: Left Arm, Patient Position: Sitting)   Pulse 64   Temp (!) 97 F (36.1 C) (Temporal)   Ht 5\' 6"  (1.676 m)   Wt 174 lb (78.9 kg)   SpO2 99%   BMI 28.08 kg/m  Wt Readings from Last 3 Encounters:  01/28/20 174 lb (78.9 kg)  04/17/17 178 lb (80.7 kg)  06/20/16 179 lb 12.8 oz (81.6 kg)     Health Maintenance Due  Topic Date Due  . Hepatitis C Screening  Never done  . COVID-19 Vaccine (1) Never done  . TETANUS/TDAP  Never done  . COLONOSCOPY  04/16/2014  . DEXA SCAN  Never done    There  are no preventive care reminders to display for this patient.  No results found for: TSH Lab Results  Component Value Date   WBC 7.0 07/17/2009   HGB 13.6 07/17/2009   HCT 39.6 07/17/2009   MCV 95.8 07/17/2009   PLT 220 07/17/2009   Lab Results  Component Value Date   NA 142 07/17/2009   K 3.9 07/17/2009   CO2 26 07/17/2009   GLUCOSE 87 07/17/2009   BUN 10 07/17/2009  CREATININE 0.92 07/17/2009   CALCIUM 9.1 07/17/2009   Lab Results  Component Value Date   CHOL  07/16/2009    173        ATP III CLASSIFICATION:  <200     mg/dL   Desirable  200-239  mg/dL   Borderline High  >=240    mg/dL   High          Lab Results  Component Value Date   HDL 84 07/16/2009   Lab Results  Component Value Date   LDLCALC  07/16/2009    71        Total Cholesterol/HDL:CHD Risk Coronary Heart Disease Risk Table                     Men   Women  1/2 Average Risk   3.4   3.3  Average Risk       5.0   4.4  2 X Average Risk   9.6   7.1  3 X Average Risk  23.4   11.0        Use the calculated Patient Ratio above and the CHD Risk Table to determine the patient's CHD Risk.        ATP III CLASSIFICATION (LDL):  <100     mg/dL   Optimal  100-129  mg/dL   Near or Above                    Optimal  130-159  mg/dL   Borderline  160-189  mg/dL   High  >190     mg/dL   Very High   Lab Results  Component Value Date   TRIG 91 07/16/2009   Lab Results  Component Value Date   CHOLHDL 2.1 07/16/2009   Lab Results  Component Value Date   HGBA1C  07/15/2009    5.1 (NOTE) The ADA recommends the following therapeutic goal for glycemic control related to Hgb A1c measurement: Goal of therapy: <6.5 Hgb A1c  Reference: American Diabetes Association: Clinical Practice Recommendations 2010, Diabetes Care, 2010, 33: (Suppl  1).      Assessment & Plan:   Problem List Items Addressed This Visit      Cardiovascular and Mediastinum   Hypertension, essential - Primary    Well controlled.  No  changes to medicines.  Continue to work on eating a healthy diet and exercise.  Labs drawn today.        Relevant Orders   CBC with Differential/Platelet   Comprehensive metabolic panel     Endocrine   Hypothyroidism    labwork pending Continue meds      Relevant Medications   levothyroxine (SYNTHROID) 112 MCG tablet   Other Relevant Orders   TSH     Other   Mixed hyperlipidemia    Well controlled.  No changes to medicines.  Continue to work on eating a healthy diet and exercise.  Labs drawn today.        Relevant Orders   Lipid panel      No orders of the defined types were placed in this encounter.   Follow-up: Return in about 6 months (around 07/30/2020) for fasting follow up.    SARA R Inis Borneman, PA-C

## 2020-01-29 LAB — LIPID PANEL
Chol/HDL Ratio: 2.2 ratio (ref 0.0–4.4)
Cholesterol, Total: 137 mg/dL (ref 100–199)
HDL: 62 mg/dL (ref >39)
LDL Chol Calc (NIH): 55 mg/dL (ref 0–99)
Triglycerides: 114 mg/dL (ref 0–149)
VLDL Cholesterol Cal: 20 mg/dL (ref 5–40)

## 2020-01-29 LAB — COMPREHENSIVE METABOLIC PANEL
ALT: 14 IU/L (ref 0–32)
AST: 22 IU/L (ref 0–40)
Albumin/Globulin Ratio: 1.8 (ref 1.2–2.2)
Albumin: 4.2 g/dL (ref 3.8–4.8)
Alkaline Phosphatase: 79 IU/L (ref 48–121)
BUN/Creatinine Ratio: 13 (ref 12–28)
BUN: 11 mg/dL (ref 8–27)
Bilirubin Total: <0.2 mg/dL (ref 0.0–1.2)
CO2: 24 mmol/L (ref 20–29)
Calcium: 9.7 mg/dL (ref 8.7–10.3)
Chloride: 103 mmol/L (ref 96–106)
Creatinine, Ser: 0.84 mg/dL (ref 0.57–1.00)
GFR calc Af Amer: 84 mL/min/{1.73_m2} (ref >59)
GFR calc non Af Amer: 73 mL/min/{1.73_m2} (ref >59)
Globulin, Total: 2.3 g/dL (ref 1.5–4.5)
Glucose: 84 mg/dL (ref 65–99)
Potassium: 5.1 mmol/L (ref 3.5–5.2)
Sodium: 142 mmol/L (ref 134–144)
Total Protein: 6.5 g/dL (ref 6.0–8.5)

## 2020-01-29 LAB — CBC WITH DIFFERENTIAL/PLATELET
Basophils Absolute: 0.1 10*3/uL (ref 0.0–0.2)
Basos: 1 %
EOS (ABSOLUTE): 0.6 10*3/uL — ABNORMAL HIGH (ref 0.0–0.4)
Eos: 7 %
Hematocrit: 44.2 % (ref 34.0–46.6)
Hemoglobin: 14.5 g/dL (ref 11.1–15.9)
Immature Grans (Abs): 0 10*3/uL (ref 0.0–0.1)
Immature Granulocytes: 0 %
Lymphocytes Absolute: 2.7 10*3/uL (ref 0.7–3.1)
Lymphs: 30 %
MCH: 31 pg (ref 26.6–33.0)
MCHC: 32.8 g/dL (ref 31.5–35.7)
MCV: 94 fL (ref 79–97)
Monocytes Absolute: 0.7 10*3/uL (ref 0.1–0.9)
Monocytes: 8 %
Neutrophils Absolute: 4.9 10*3/uL (ref 1.4–7.0)
Neutrophils: 54 %
Platelets: 268 10*3/uL (ref 150–450)
RBC: 4.68 x10E6/uL (ref 3.77–5.28)
RDW: 12.5 % (ref 11.7–15.4)
WBC: 9.1 10*3/uL (ref 3.4–10.8)

## 2020-01-29 LAB — CARDIOVASCULAR RISK ASSESSMENT

## 2020-01-29 LAB — TSH: TSH: 0.57 u[IU]/mL (ref 0.450–4.500)

## 2020-01-31 ENCOUNTER — Other Ambulatory Visit: Payer: Self-pay | Admitting: Family Medicine

## 2020-01-31 ENCOUNTER — Other Ambulatory Visit: Payer: Self-pay | Admitting: Physician Assistant

## 2020-01-31 MED ORDER — LEVOTHYROXINE SODIUM 112 MCG PO TABS: 112.0000 ug | ORAL_TABLET | Freq: Every day | ORAL | 1 refills | Status: DC

## 2020-06-01 ENCOUNTER — Other Ambulatory Visit: Payer: Self-pay | Admitting: Physician Assistant

## 2020-06-01 DIAGNOSIS — E782 Mixed hyperlipidemia: Secondary | ICD-10-CM

## 2020-06-01 DIAGNOSIS — I10 Essential (primary) hypertension: Secondary | ICD-10-CM

## 2020-06-01 MED ORDER — DILTIAZEM HCL ER COATED BEADS 360 MG PO CP24: 360.0000 mg | ORAL_CAPSULE | Freq: Every day | ORAL | 1 refills | Status: DC

## 2020-06-01 MED ORDER — LEVOTHYROXINE SODIUM 112 MCG PO TABS: 112.0000 ug | ORAL_TABLET | Freq: Every day | ORAL | 1 refills | Status: DC

## 2020-06-01 MED ORDER — SIMVASTATIN 40 MG PO TABS: 40.0000 mg | ORAL_TABLET | Freq: Every day | ORAL | 1 refills | Status: DC

## 2020-07-07 ENCOUNTER — Encounter: Payer: PRIVATE HEALTH INSURANCE | Admitting: Physician Assistant

## 2020-07-30 ENCOUNTER — Encounter: Payer: Self-pay | Admitting: Physician Assistant

## 2020-07-30 ENCOUNTER — Other Ambulatory Visit: Payer: Self-pay

## 2020-07-30 ENCOUNTER — Ambulatory Visit (INDEPENDENT_AMBULATORY_CARE_PROVIDER_SITE_OTHER): Payer: Medicare Other | Admitting: Physician Assistant

## 2020-07-30 VITALS — BP 132/82 | HR 70 | Temp 96.9°F | Ht 66.0 in | Wt 175.2 lb

## 2020-07-30 DIAGNOSIS — Z23 Encounter for immunization: Secondary | ICD-10-CM | POA: Insufficient documentation

## 2020-07-30 DIAGNOSIS — E782 Mixed hyperlipidemia: Secondary | ICD-10-CM | POA: Diagnosis not present

## 2020-07-30 DIAGNOSIS — E039 Hypothyroidism, unspecified: Secondary | ICD-10-CM

## 2020-07-30 DIAGNOSIS — I10 Essential (primary) hypertension: Secondary | ICD-10-CM

## 2020-07-30 NOTE — Progress Notes (Signed)
Subjective:  Patient ID: Tammy Brady, female    DOB: April 28, 1951  Age: 70 y.o. MRN: [id number]  Chief Complaint  Patient presents with  . Hypertension    HPI Pt presents for follow up of hypertension.The patient is tolerating the medication well without side effects. Compliance with treatment has been good; including taking medication as directed , maintains a healthy diet and regular exercise regimen , and following up as directed.pt taking cardizem CD 360mg  qd  Mixed hyperlipidemia  Pt presents with hyperlipidemia.  Compliance with treatment has been good; The patient is compliant with medications, maintains a low cholesterol diet , follows up as directed , and maintains an exercise regimen . The patient denies experiencing any hypercholesterolemia related symptoms. Currently taking zocor 40mg  qd  Pt with history of hypothyroidism - on synthroid 155mcg qd - due for labwork  Pt would like flu shot Current Outpatient Medications on File Prior to Visit  Medication Sig Dispense Refill  . calcium carbonate (OS-CAL) 600 MG TABS Take 600 mg by mouth daily.    Marland Kitchen diltiazem (CARDIZEM CD) 360 MG 24 hr capsule Take 1 capsule (360 mg total) by mouth daily. 90 capsule 1  . levothyroxine (SYNTHROID) 112 MCG tablet Take 1 tablet (112 mcg total) by mouth daily. 90 tablet 1  . Multiple Vitamin (MULTIVITAMIN) tablet Take 1 tablet by mouth daily.    . nitroGLYCERIN (NITROSTAT) 0.4 MG SL tablet Place 1 tablet (0.4 mg total) under the tongue every 5 (five) minutes as needed. 25 tablet 6  . simvastatin (ZOCOR) 40 MG tablet Take 1 tablet (40 mg total) by mouth at bedtime. 90 tablet 1   No current facility-administered medications on file prior to visit.   Past Medical History:  Diagnosis Date  . CAD (coronary artery disease)    Mild non-obstructive disease by cath January 2011  . Carotid artery disease ([place])    Followed by Dr Kellie Simmering  . GERD (gastroesophageal reflux disease)   . Hyperlipidemia   .  Hypertension   . Hypothyroidism    Past Surgical History:  Procedure Laterality Date  . APPENDECTOMY      Family History  Problem Relation Age of Onset  . Cancer Mother   . Coronary artery disease Father   . Coronary artery disease Maternal Grandmother    Social History   Socioeconomic History  . Marital status: Married    Spouse name: Not on file  . Number of children: Not on file  . Years of education: Not on file  . Highest education level: Not on file  Occupational History  . Occupation: Field seismologist: [employer]  Tobacco Use  . Smoking status: Former Smoker    Packs/day: 1.00    Years: 40.00    Pack years: 40.00    Types: Cigarettes    Quit date: 05/11/2009    Years since quitting: 11.2  . Smokeless tobacco: Never Used  Substance and Sexual Activity  . Alcohol use: Yes    Alcohol/week: 7.0 standard drinks    Types: 7 Standard drinks or equivalent per week  . Drug use: No  . Sexual activity: Not on file  Other Topics Concern  . Not on file  Social History Narrative  . Not on file   Social Determinants of Health   Financial Resource Strain: Not on file  Food Insecurity: Not on file  Transportation Needs: Not on file  Physical Activity: Not on file  Stress: Not on file  Social  Connections: Not on file    Review of Systems CONSTITUTIONAL: Negative for chills, fatigue, fever, unintentional weight gain and unintentional weight loss.   CARDIOVASCULAR: Negative for chest pain, dizziness, palpitations and pedal edema.  RESPIRATORY: Negative for recent cough and dyspnea.  GASTROINTESTINAL: Negative for abdominal pain, acid reflux symptoms, constipation, diarrhea, nausea and vomiting.  MSK: Negative for arthralgias and myalgias.  INTEGUMENTARY: Negative for rash.  PSYCHIATRIC: Negative for sleep disturbance and to question depression screen.  Negative for depression, negative for anhedonia.       Objective:  BP 132/82 (BP Location: Left Arm,  Patient Position: Sitting, Cuff Size: Normal)   Pulse 70   Temp (!) 96.9 F (36.1 C) (Temporal)   Ht 5\' 6"  (1.676 m)   Wt 175 lb 3.2 oz (79.5 kg)   SpO2 95%   BMI 28.28 kg/m   BP/Weight 07/30/2020 01/28/2020 69/10/8544  Systolic BP 270 350 093  Diastolic BP 82 82 83  Wt. (Lbs) 175.2 174 178  BMI 28.28 28.08 28.73    Physical Exam PHYSICAL EXAM:   VS: BP 132/82 (BP Location: Left Arm, Patient Position: Sitting, Cuff Size: Normal)   Pulse 70   Temp (!) 96.9 F (36.1 C) (Temporal)   Ht 5\' 6"  (1.676 m)   Wt 175 lb 3.2 oz (79.5 kg)   SpO2 95%   BMI 28.28 kg/m   GEN: Well nourished, well developed, in no acute distress  Neck: no JVD or masses -  Cardiac: RRR; no murmurs, rubs, or gallops,no edema -  Respiratory:  normal respiratory rate and pattern with no distress - normal breath sounds with no rales, rhonchi, wheezes or rubs GI: normal bowel sounds, no masses or tenderness MS: no deformity or atrophy  Skin: warm and dry, no rash   Psych: euthymic mood, appropriate affect and demeanor  Diabetic Foot Exam - Simple   No data filed      Lab Results  Component Value Date   WBC 9.1 01/28/2020   HGB 14.5 01/28/2020   HCT 44.2 01/28/2020   PLT 268 01/28/2020   GLUCOSE 84 01/28/2020   CHOL 137 01/28/2020   TRIG 114 01/28/2020   HDL 62 01/28/2020   LDLCALC 55 01/28/2020   ALT 14 01/28/2020   AST 22 01/28/2020   NA 142 01/28/2020   K 5.1 01/28/2020   CL 103 01/28/2020   CREATININE 0.84 01/28/2020   BUN 11 01/28/2020   CO2 24 01/28/2020   TSH 0.570 01/28/2020   INR 0.99 07/16/2009   HGBA1C  07/15/2009    5.1 (NOTE) The ADA recommends the following therapeutic goal for glycemic control related to Hgb A1c measurement: Goal of therapy: <6.5 Hgb A1c  Reference: American Diabetes Association: Clinical Practice Recommendations 2010, Diabetes Care, 2010, 33: (Suppl  1).      Assessment & Plan:   1. Hypertension, essential - CBC with Differential/Platelet -  Comprehensive metabolic panel  2. Mixed hyperlipidemia - Lipid panel  3. Acquired hypothyroidism - TSH  4. Need for prophylactic vaccination and inoculation against influenza - Flu Vaccine QUAD High Dose(Fluad)    No orders of the defined types were placed in this encounter.   Orders Placed This Encounter  Procedures  . Flu Vaccine QUAD High Dose(Fluad)  . CBC with Differential/Platelet  . Comprehensive metabolic panel  . TSH  . Lipid panel  . HM COLONOSCOPY       Follow-up: Return in about 6 months (around 01/27/2021) for chronic fasting.  An After Visit  Summary was printed and given to the patient.  Yetta Flock [facility] [phone]

## 2020-07-31 ENCOUNTER — Other Ambulatory Visit: Payer: Self-pay | Admitting: Physician Assistant

## 2020-07-31 DIAGNOSIS — E875 Hyperkalemia: Secondary | ICD-10-CM

## 2020-07-31 LAB — COMPREHENSIVE METABOLIC PANEL
ALT: 14 IU/L (ref 0–32)
AST: 22 IU/L (ref 0–40)
Albumin/Globulin Ratio: 1.9 (ref 1.2–2.2)
Albumin: 4.2 g/dL (ref 3.8–4.8)
Alkaline Phosphatase: 82 IU/L (ref 44–121)
BUN/Creatinine Ratio: 23 (ref 12–28)
BUN: 17 mg/dL (ref 8–27)
Bilirubin Total: <0.2 mg/dL (ref 0.0–1.2)
CO2: 24 mmol/L (ref 20–29)
Calcium: 9.4 mg/dL (ref 8.7–10.3)
Chloride: 104 mmol/L (ref 96–106)
Creatinine, Ser: 0.74 mg/dL (ref 0.57–1.00)
GFR calc Af Amer: 97 mL/min/{1.73_m2} (ref >59)
GFR calc non Af Amer: 84 mL/min/{1.73_m2} (ref >59)
Globulin, Total: 2.2 g/dL (ref 1.5–4.5)
Glucose: 92 mg/dL (ref 65–99)
Potassium: 5.6 mmol/L — ABNORMAL HIGH (ref 3.5–5.2)
Sodium: 141 mmol/L (ref 134–144)
Total Protein: 6.4 g/dL (ref 6.0–8.5)

## 2020-07-31 LAB — CBC WITH DIFFERENTIAL/PLATELET
Basophils Absolute: 0.1 10*3/uL (ref 0.0–0.2)
Basos: 1 %
EOS (ABSOLUTE): 0.3 10*3/uL (ref 0.0–0.4)
Eos: 3 %
Hematocrit: 42.6 % (ref 34.0–46.6)
Hemoglobin: 14 g/dL (ref 11.1–15.9)
Immature Grans (Abs): 0 10*3/uL (ref 0.0–0.1)
Immature Granulocytes: 0 %
Lymphocytes Absolute: 2.5 10*3/uL (ref 0.7–3.1)
Lymphs: 30 %
MCH: 30.9 pg (ref 26.6–33.0)
MCHC: 32.9 g/dL (ref 31.5–35.7)
MCV: 94 fL (ref 79–97)
Monocytes Absolute: 0.8 10*3/uL (ref 0.1–0.9)
Monocytes: 9 %
Neutrophils Absolute: 4.6 10*3/uL (ref 1.4–7.0)
Neutrophils: 57 %
Platelets: 266 10*3/uL (ref 150–450)
RBC: 4.53 x10E6/uL (ref 3.77–5.28)
RDW: 12.7 % (ref 11.7–15.4)
WBC: 8.2 10*3/uL (ref 3.4–10.8)

## 2020-07-31 LAB — LIPID PANEL
Chol/HDL Ratio: 2.4 ratio (ref 0.0–4.4)
Cholesterol, Total: 162 mg/dL (ref 100–199)
HDL: 68 mg/dL (ref >39)
LDL Chol Calc (NIH): 82 mg/dL (ref 0–99)
Triglycerides: 63 mg/dL (ref 0–149)
VLDL Cholesterol Cal: 12 mg/dL (ref 5–40)

## 2020-07-31 LAB — TSH: TSH: 1.78 u[IU]/mL (ref 0.450–4.500)

## 2020-07-31 LAB — CARDIOVASCULAR RISK ASSESSMENT

## 2020-08-07 ENCOUNTER — Other Ambulatory Visit: Payer: Self-pay

## 2020-08-07 ENCOUNTER — Other Ambulatory Visit: Payer: Medicare Other

## 2020-08-07 DIAGNOSIS — E875 Hyperkalemia: Secondary | ICD-10-CM

## 2020-08-08 LAB — COMPREHENSIVE METABOLIC PANEL WITH GFR
ALT: 15 [IU]/L (ref 0–32)
AST: 28 [IU]/L (ref 0–40)
Albumin/Globulin Ratio: 1.6 (ref 1.2–2.2)
Albumin: 4.1 g/dL (ref 3.8–4.8)
Alkaline Phosphatase: 88 [IU]/L (ref 44–121)
BUN/Creatinine Ratio: 26 (ref 12–28)
BUN: 19 mg/dL (ref 8–27)
Bilirubin Total: <0.2 mg/dL (ref 0.0–1.2)
CO2: 18 mmol/L — ABNORMAL LOW (ref 20–29)
Calcium: 9.4 mg/dL (ref 8.7–10.3)
Chloride: 107 mmol/L — ABNORMAL HIGH (ref 96–106)
Creatinine, Ser: 0.74 mg/dL (ref 0.57–1.00)
GFR calc Af Amer: 97 mL/min/{1.73_m2}
GFR calc non Af Amer: 84 mL/min/{1.73_m2}
Globulin, Total: 2.6 g/dL (ref 1.5–4.5)
Glucose: 94 mg/dL (ref 65–99)
Potassium: 5.6 mmol/L — ABNORMAL HIGH (ref 3.5–5.2)
Sodium: 147 mmol/L — ABNORMAL HIGH (ref 134–144)
Total Protein: 6.7 g/dL (ref 6.0–8.5)

## 2020-08-10 ENCOUNTER — Other Ambulatory Visit: Payer: Self-pay | Admitting: Physician Assistant

## 2020-08-10 DIAGNOSIS — R899 Unspecified abnormal finding in specimens from other organs, systems and tissues: Secondary | ICD-10-CM

## 2020-08-26 ENCOUNTER — Other Ambulatory Visit: Payer: Medicare Other

## 2020-08-26 ENCOUNTER — Other Ambulatory Visit: Payer: Self-pay

## 2020-08-26 DIAGNOSIS — R899 Unspecified abnormal finding in specimens from other organs, systems and tissues: Secondary | ICD-10-CM

## 2020-08-26 LAB — COMPREHENSIVE METABOLIC PANEL
ALT: 11 IU/L (ref 0–32)
AST: 16 IU/L (ref 0–40)
Albumin/Globulin Ratio: 1.8 (ref 1.2–2.2)
Albumin: 4.3 g/dL (ref 3.8–4.8)
Alkaline Phosphatase: 89 IU/L (ref 44–121)
BUN/Creatinine Ratio: 18 (ref 12–28)
BUN: 15 mg/dL (ref 8–27)
Bilirubin Total: <0.2 mg/dL (ref 0.0–1.2)
CO2: 24 mmol/L (ref 20–29)
Calcium: 10 mg/dL (ref 8.7–10.3)
Chloride: 101 mmol/L (ref 96–106)
Creatinine, Ser: 0.84 mg/dL (ref 0.57–1.00)
GFR calc Af Amer: 83 mL/min/{1.73_m2} (ref >59)
GFR calc non Af Amer: 72 mL/min/{1.73_m2} (ref >59)
Globulin, Total: 2.4 g/dL (ref 1.5–4.5)
Glucose: 83 mg/dL (ref 65–99)
Potassium: 5.1 mmol/L (ref 3.5–5.2)
Sodium: 141 mmol/L (ref 134–144)
Total Protein: 6.7 g/dL (ref 6.0–8.5)

## 2020-09-07 ENCOUNTER — Other Ambulatory Visit: Payer: Self-pay

## 2020-09-07 DIAGNOSIS — I10 Essential (primary) hypertension: Secondary | ICD-10-CM

## 2020-09-07 DIAGNOSIS — E782 Mixed hyperlipidemia: Secondary | ICD-10-CM

## 2020-09-07 MED ORDER — DILTIAZEM HCL ER COATED BEADS 360 MG PO CP24: 360.0000 mg | ORAL_CAPSULE | Freq: Every day | ORAL | 1 refills | Status: DC

## 2020-09-07 MED ORDER — LEVOTHYROXINE SODIUM 112 MCG PO TABS: 112.0000 ug | ORAL_TABLET | Freq: Every day | ORAL | 1 refills | Status: DC

## 2020-09-07 MED ORDER — SIMVASTATIN 40 MG PO TABS: 40.0000 mg | ORAL_TABLET | Freq: Every day | ORAL | 1 refills | Status: DC

## 2020-11-26 DIAGNOSIS — C031 Malignant neoplasm of lower gum: Secondary | ICD-10-CM | POA: Diagnosis not present

## 2020-12-04 DIAGNOSIS — C031 Malignant neoplasm of lower gum: Secondary | ICD-10-CM | POA: Diagnosis not present

## 2020-12-08 ENCOUNTER — Other Ambulatory Visit: Payer: Self-pay

## 2020-12-08 ENCOUNTER — Encounter: Payer: Self-pay | Admitting: Physician Assistant

## 2020-12-08 ENCOUNTER — Ambulatory Visit (INDEPENDENT_AMBULATORY_CARE_PROVIDER_SITE_OTHER): Payer: Medicare Other | Admitting: Physician Assistant

## 2020-12-08 VITALS — BP 134/82 | HR 81 | Temp 97.1°F | Ht 66.0 in | Wt 169.2 lb

## 2020-12-08 DIAGNOSIS — F419 Anxiety disorder, unspecified: Secondary | ICD-10-CM | POA: Insufficient documentation

## 2020-12-08 DIAGNOSIS — C060 Malignant neoplasm of cheek mucosa: Secondary | ICD-10-CM

## 2020-12-08 DIAGNOSIS — K2900 Acute gastritis without bleeding: Secondary | ICD-10-CM | POA: Diagnosis not present

## 2020-12-08 DIAGNOSIS — C069 Malignant neoplasm of mouth, unspecified: Secondary | ICD-10-CM | POA: Insufficient documentation

## 2020-12-08 DIAGNOSIS — K2970 Gastritis, unspecified, without bleeding: Secondary | ICD-10-CM | POA: Insufficient documentation

## 2020-12-08 MED ORDER — TRAZODONE HCL 50 MG PO TABS: 50.0000 mg | ORAL_TABLET | Freq: Every day | ORAL | 1 refills | Status: DC

## 2020-12-08 NOTE — Progress Notes (Signed)
Subjective:  Patient ID: Tammy Brady, female    DOB: Aug 26, 1950  Age: 70 y.o. MRN: [id number]  Chief Complaint  Patient presents with  . Referral    HPI  pt here today to be referred to ENT - she was seen by her dentist then  Oral surgery for lesion on her lower palate/gumline - she was diagnosed with squamous cell carcinoma and needs further evaluation and treatment  Pt states that she has bad dentition and gingival irritation with loose teeth as well  Pt complains of heartburn and reflux symptoms - she states she takes pepcid which helps - with those symptoms and the recent diagnosis of oral squamous cell carcinoma an endoscopy is recommended for further evaluation and treatment which pt is agreeable to  Pt requests medication to help her sleep - she is having discomfort in her mouth and is very worried and anxious - pt requests no type of addictive meds because of her history of being alcoholic Current Outpatient Medications on File Prior to Visit  Medication Sig Dispense Refill  . calcium carbonate (OS-CAL) 600 MG TABS Take 600 mg by mouth daily.    . chlorhexidine (PERIDEX) 0.12 % solution 15 mLs 2 (two) times daily.    Marland Kitchen diltiazem (CARDIZEM CD) 360 MG 24 hr capsule Take 1 capsule (360 mg total) by mouth daily. 90 capsule 1  . ibuprofen (ADVIL) 600 MG tablet Take 600 mg by mouth every 6 (six) hours.    Marland Kitchen levothyroxine (SYNTHROID) 112 MCG tablet Take 1 tablet (112 mcg total) by mouth daily. 90 tablet 1  . Multiple Vitamin (MULTIVITAMIN) tablet Take 1 tablet by mouth daily.    . nitroGLYCERIN (NITROSTAT) 0.4 MG SL tablet Place 1 tablet (0.4 mg total) under the tongue every 5 (five) minutes as needed. 25 tablet 6  . simvastatin (ZOCOR) 40 MG tablet Take 1 tablet (40 mg total) by mouth at bedtime. 90 tablet 1   No current facility-administered medications on file prior to visit.   Past Medical History:  Diagnosis Date  . CAD (coronary artery disease)    Mild non-obstructive  disease by cath January 2011  . Carotid artery disease ([place])    Followed by Dr Kellie Simmering  . GERD (gastroesophageal reflux disease)   . Hyperlipidemia   . Hypertension   . Hypothyroidism    Past Surgical History:  Procedure Laterality Date  . APPENDECTOMY      Family History  Problem Relation Age of Onset  . Cancer Mother   . Coronary artery disease Father   . Coronary artery disease Maternal Grandmother    Social History   Socioeconomic History  . Marital status: Married    Spouse name: Not on file  . Number of children: Not on file  . Years of education: Not on file  . Highest education level: Not on file  Occupational History  . Occupation: Field seismologist: [employer]  Tobacco Use  . Smoking status: Former Smoker    Packs/day: 1.00    Years: 40.00    Pack years: 40.00    Types: Cigarettes    Quit date: 05/11/2009    Years since quitting: 11.5  . Smokeless tobacco: Never Used  Substance and Sexual Activity  . Alcohol use: Yes    Alcohol/week: 7.0 standard drinks    Types: 7 Standard drinks or equivalent per week  . Drug use: No  . Sexual activity: Not on file  Other Topics Concern  . Not  on file  Social History Narrative  . Not on file   Social Determinants of Health   Financial Resource Strain: Not on file  Food Insecurity: Not on file  Transportation Needs: Not on file  Physical Activity: Not on file  Stress: Not on file  Social Connections: Not on file    Review of Systems CONSTITUTIONAL: Negative for chills, fatigue, fever, unintentional weight gain and unintentional weight loss.  E/N/T: see HPI CARDIOVASCULAR: Negative for chest pain, dizziness, palpitations and pedal edema.  RESPIRATORY: Negative for recent cough and dyspnea.  GASTROINTESTINAL: see HPI MSK: Negative for arthralgias and myalgias.  PSYCHIATRIC: see HPI      Objective:  BP 134/82 (BP Location: Left Arm, Patient Position: Sitting, Cuff Size: Normal)   Pulse 81   Temp  (!) 97.1 F (36.2 C) (Temporal)   Ht 5\' 6"  (1.676 m)   Wt 169 lb 3.2 oz (76.7 kg)   SpO2 93%   BMI 27.31 kg/m   BP/Weight 12/08/2020 07/30/2020 1/96/2229  Systolic BP 798 921 194  Diastolic BP 82 82 82  Wt. (Lbs) 169.2 175.2 174  BMI 27.31 28.28 28.08    Physical Exam PHYSICAL EXAM:   VS: BP 134/82 (BP Location: Left Arm, Patient Position: Sitting, Cuff Size: Normal)   Pulse 81   Temp (!) 97.1 F (36.2 C) (Temporal)   Ht 5\' 6"  (1.676 m)   Wt 169 lb 3.2 oz (76.7 kg)   SpO2 93%   BMI 27.31 kg/m   GEN: Well nourished, well developed, in no acute distress  Oropharynx - dentition very poor - gingival erythema and edema - large lesion encompassing lower gumline on left side of lower jaw Neck: no JVD or masses - no thyromegaly Cardiac: RRR; no murmurs, rubs, or gallops, Respiratory:  normal respiratory rate and pattern with no distress - normal breath sounds with no rales, rhonchi, wheezes or rubs GI: normal bowel sounds, no masses or tenderness Skin: warm and dry, no rash  Psych: euthymic mood, appropriate affect and demeanor  Diabetic Foot Exam - Simple   No data filed      Lab Results  Component Value Date   WBC 8.2 07/30/2020   HGB 14.0 07/30/2020   HCT 42.6 07/30/2020   PLT 266 07/30/2020   GLUCOSE 83 08/26/2020   CHOL 162 07/30/2020   TRIG 63 07/30/2020   HDL 68 07/30/2020   LDLCALC 82 07/30/2020   ALT 11 08/26/2020   AST 16 08/26/2020   NA 141 08/26/2020   K 5.1 08/26/2020   CL 101 08/26/2020   CREATININE 0.84 08/26/2020   BUN 15 08/26/2020   CO2 24 08/26/2020   TSH 1.780 07/30/2020   INR 0.99 07/16/2009   HGBA1C  07/15/2009    5.1 (NOTE) The ADA recommends the following therapeutic goal for glycemic control related to Hgb A1c measurement: Goal of therapy: <6.5 Hgb A1c  Reference: American Diabetes Association: Clinical Practice Recommendations 2010, Diabetes Care, 2010, 33: (Suppl  1).      Assessment & Plan:   1. Squamous cell carcinoma of oral  mucosa [facility]) Called [facility] otolaryngology dept -- they will send referral for there to see the Head and Neck team for further evaluation and treatment 2. Other acute gastritis without hemorrhage - CBC with Differential/Platelet - Comprehensive metabolic panel - Helicobacter pylori abs-IgG+IgA, bld Continue pepcid as directed Refer to GI for further evaluation 3. Anxiety  rx for trazodone 50mg  qhs  Meds ordered this encounter  Medications  .  traZODone (DESYREL) 50 MG tablet    Sig: Take 1 tablet (50 mg total) by mouth at bedtime.    Dispense:  30 tablet    Refill:  1    Order Specific Question:   Supervising Provider    AnswerShelton Silvas    Orders Placed This Encounter  Procedures  . CBC with Differential/Platelet  . Comprehensive metabolic panel  . Helicobacter pylori abs-IgG+IgA, bld  . Ambulatory referral to Gastroenterology     Follow-up: Return for as scheduled for chronic visit.  An After Visit Summary was printed and given to the patient.  Yetta Flock [facility] [phone]

## 2020-12-09 LAB — COMPREHENSIVE METABOLIC PANEL
ALT: 24 IU/L (ref 0–32)
AST: 39 IU/L (ref 0–40)
Albumin/Globulin Ratio: 2 (ref 1.2–2.2)
Albumin: 4.2 g/dL (ref 3.8–4.8)
Alkaline Phosphatase: 84 IU/L (ref 44–121)
BUN/Creatinine Ratio: 21 (ref 12–28)
BUN: 15 mg/dL (ref 8–27)
Bilirubin Total: <0.2 mg/dL (ref 0.0–1.2)
CO2: 22 mmol/L (ref 20–29)
Calcium: 8.7 mg/dL (ref 8.7–10.3)
Chloride: 103 mmol/L (ref 96–106)
Creatinine, Ser: 0.71 mg/dL (ref 0.57–1.00)
Globulin, Total: 2.1 g/dL (ref 1.5–4.5)
Glucose: 89 mg/dL (ref 65–99)
Potassium: 4.4 mmol/L (ref 3.5–5.2)
Sodium: 142 mmol/L (ref 134–144)
Total Protein: 6.3 g/dL (ref 6.0–8.5)
eGFR: 93 mL/min/{1.73_m2} (ref >59)

## 2020-12-09 LAB — CBC WITH DIFFERENTIAL/PLATELET
Basophils Absolute: 0.1 10*3/uL (ref 0.0–0.2)
Basos: 1 %
EOS (ABSOLUTE): 0.3 10*3/uL (ref 0.0–0.4)
Eos: 3 %
Hematocrit: 36.7 % (ref 34.0–46.6)
Hemoglobin: 12.2 g/dL (ref 11.1–15.9)
Immature Grans (Abs): 0 10*3/uL (ref 0.0–0.1)
Immature Granulocytes: 0 %
Lymphocytes Absolute: 2.3 10*3/uL (ref 0.7–3.1)
Lymphs: 23 %
MCH: 31 pg (ref 26.6–33.0)
MCHC: 33.2 g/dL (ref 31.5–35.7)
MCV: 93 fL (ref 79–97)
Monocytes Absolute: 0.9 10*3/uL (ref 0.1–0.9)
Monocytes: 9 %
Neutrophils Absolute: 6.3 10*3/uL (ref 1.4–7.0)
Neutrophils: 64 %
Platelets: 289 10*3/uL (ref 150–450)
RBC: 3.94 x10E6/uL (ref 3.77–5.28)
RDW: 14.5 % (ref 11.7–15.4)
WBC: 9.8 10*3/uL (ref 3.4–10.8)

## 2020-12-09 LAB — HELICOBACTER PYLORI ABS-IGG+IGA, BLD
H. pylori, IgA Abs: <9 units (ref 0.0–8.9)
H. pylori, IgG AbS: 0.21 Index Value (ref 0.00–0.79)

## 2020-12-10 ENCOUNTER — Other Ambulatory Visit: Payer: Self-pay | Admitting: Physician Assistant

## 2020-12-10 DIAGNOSIS — C060 Malignant neoplasm of cheek mucosa: Secondary | ICD-10-CM

## 2020-12-11 ENCOUNTER — Telehealth: Payer: Self-pay

## 2020-12-11 DIAGNOSIS — C411 Malignant neoplasm of mandible: Secondary | ICD-10-CM | POA: Diagnosis not present

## 2020-12-11 NOTE — Telephone Encounter (Signed)
Patient called to notified you of her appointment with [facility] ENT Monday at 8:15 AM.

## 2020-12-14 DIAGNOSIS — Z720 Tobacco use: Secondary | ICD-10-CM | POA: Diagnosis not present

## 2020-12-14 DIAGNOSIS — I2510 Atherosclerotic heart disease of native coronary artery without angina pectoris: Secondary | ICD-10-CM | POA: Diagnosis not present

## 2020-12-14 DIAGNOSIS — I201 Angina pectoris with documented spasm: Secondary | ICD-10-CM | POA: Diagnosis not present

## 2020-12-14 DIAGNOSIS — J439 Emphysema, unspecified: Secondary | ICD-10-CM | POA: Diagnosis not present

## 2020-12-14 DIAGNOSIS — S0181XA Laceration without foreign body of other part of head, initial encounter: Secondary | ICD-10-CM | POA: Diagnosis not present

## 2020-12-14 DIAGNOSIS — R918 Other nonspecific abnormal finding of lung field: Secondary | ICD-10-CM | POA: Diagnosis not present

## 2020-12-14 DIAGNOSIS — C031 Malignant neoplasm of lower gum: Secondary | ICD-10-CM | POA: Diagnosis not present

## 2020-12-14 DIAGNOSIS — J432 Centrilobular emphysema: Secondary | ICD-10-CM | POA: Diagnosis not present

## 2020-12-17 ENCOUNTER — Encounter: Payer: Self-pay | Admitting: Physician Assistant

## 2020-12-21 ENCOUNTER — Telehealth: Payer: Self-pay | Admitting: *Deleted

## 2020-12-21 NOTE — Telephone Encounter (Signed)
Tammy Brady  [id number] Got a DOD call earlier that April took because I was busy. stating they needed a pre op clearance for cancer treatment. we didn't have any openings today so Dr. Rayann Heman told me to put on DOD tomorrow. which I did but now I actually looked at her and I don't see anything about pre op in her chart.... ideas?   I called the pt to see who was the surgeon who will be doing her surgery. Pt states Dr. Ileene Rubens with [facility]   [address]   [place], [place] [address]   [phone]    See notes above from Dr. Jackalyn Lombard nurse Otila Kluver in regards to appt tomorrow for pre op clearance.      [facility] Pre-operative Risk Assessment    Patient Name: Tammy Brady  DOB: March 02, 1951  MRN: [id number]   HEARTCARE STAFF: - Please ensure there is not already an duplicate clearance open for this procedure. - Under Visit Info/Reason for Call, type in Other and utilize the format Clearance MM/DD/YY or Clearance TBD. Do not use dashes or single digits. - If request is for dental extraction, please clarify the # of teeth to be extracted. - If the patient is currently at the dentist's office, call Pre-Op APP to address. If the patient is not currently in the dentist office, please route to the Pre-Op pool  Request for surgical clearance:  What type of surgery is being performed? 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 TBD   What type of clearance is required (medical clearance vs. Pharmacy clearance to hold med vs. Both)? MEDICAL  Are there any medications that need to be held prior to surgery and how long? NONE TO BE HELD    5.   Practice name and name of physician performing surgery? [facility] [address]   [place] [address], [place] [address]; DR. Ileene Rubens  6. What is the office phone number? [phone]      7.   What is the office fax  number? [phone]  8.   Anesthesia type (None, local, MAC, general) ? GENERAL OR CHOICE   Julaine Hua 12/21/2020, 2:30 PM  _________________________________________________________________   (provider comments below)

## 2020-12-22 ENCOUNTER — Encounter: Payer: Self-pay | Admitting: Cardiology

## 2020-12-22 ENCOUNTER — Other Ambulatory Visit: Payer: Self-pay

## 2020-12-22 ENCOUNTER — Ambulatory Visit: Payer: Medicare Other | Admitting: Cardiology

## 2020-12-22 VITALS — BP 119/70 | HR 64 | Ht 66.0 in | Wt 167.0 lb

## 2020-12-22 DIAGNOSIS — I10 Essential (primary) hypertension: Secondary | ICD-10-CM | POA: Diagnosis not present

## 2020-12-22 DIAGNOSIS — I2510 Atherosclerotic heart disease of native coronary artery without angina pectoris: Secondary | ICD-10-CM

## 2020-12-22 DIAGNOSIS — E782 Mixed hyperlipidemia: Secondary | ICD-10-CM

## 2020-12-22 DIAGNOSIS — I6529 Occlusion and stenosis of unspecified carotid artery: Secondary | ICD-10-CM

## 2020-12-22 NOTE — Addendum Note (Signed)
Addended by: Fransico Him R on: 12/22/2020 10:36 AM   Modules accepted: Orders

## 2020-12-22 NOTE — Patient Instructions (Signed)
Medication Instructions:  Your physician recommends that you continue on your current medications as directed. Please refer to the Current Medication list given to you today.  *If you need a refill on your cardiac medications before your next appointment, please call your pharmacy*   Lab Work: Fasting lipid panel on same day as stress test.  If you have labs (blood work) drawn today and your tests are completely normal, you will receive your results only by: Kite (if you have MyChart) OR A paper copy in the mail If you have any lab test that is abnormal or we need to change your treatment, we will call you to review the results.   Testing/Procedures: Your physician has requested that you have an exercise tolerance test. For further information please visit [email or web address]. Please also follow instruction sheet, as given.   Follow-Up: At [facility], you and your health needs are our priority.  As part of our continuing mission to provide you with exceptional heart care, we have created designated Provider Care Teams.  These Care Teams include your primary Cardiologist (physician) and Advanced Practice Providers (APPs -  Physician Assistants and Nurse Practitioners) who all work together to provide you with the care you need, when you need it.   Your next appointment:   1 year(s)  The format for your next appointment:   In Person  Provider:   Fransico Him, MD

## 2020-12-22 NOTE — Addendum Note (Signed)
Addended by: Antonieta Iba on: 12/22/2020 10:30 AM   Modules accepted: Orders

## 2020-12-22 NOTE — Progress Notes (Addendum)
Cardiology CONSULT Note    Date:  12/22/2020   ID:  KAGAN HIETPAS, DOB 11-Jul-1951, MRN [id number]  PCP:  Marge Duncans, PA-C  Cardiologist:  Fransico Him, MD   Chief Complaint  Patient presents with   New Patient (Initial Visit)    CAD, HTN, HLD, carotid artery stenosis     History of Present Illness:  Tammy Brady is a 70 y.o. female who is being seen today for the preoperative cardiac clearance for oral CA surgery at the request of Marge Duncans, PA-C.  This is a 70yo female with a hx of ASCAD, former tobacco abuse, HLD, HTN and carotid artery disease who was remotely followed by Dr. Julianne Handler but was lost to followup.    She was initially admitted to [facility] in January 2011 with a NSTEMI. Cardiac cath 07/16/09 with 20% LAD stenosis, 20% diffuse RCA stenosis, no Circumflex disease. Her event was felt to be secondary to coronary vasospasm. She has been treated with Cardizem and uses SL NTG prn. She stopped smoking in November 2010. She has known carotid artery disease followed by Dr. Kellie Simmering. She has GERD treated with Protonix.   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 She has not been seen by Cardiology since 2017.   She denies any exertional chest pain or pressure, PND, orthopnea, LE edema, dizziness, palpitations or syncope. She has chronic DOE from chronic smoking and is stable.  She does occasionally has some chest heaviness that is related to her GERD.  She describes it as a heaviness that is nonexertional and completely resolves when she belches.  She has not had any anginal CP and has not had any to take any NTG in several years. She is compliant with her meds and is tolerating meds with no SE.     Past Medical History:  Diagnosis Date   CAD (coronary artery disease)    Mild non-obstructive disease by cath January 2011   Carotid artery disease ([place])    Followed by Dr Kellie Simmering   GERD (gastroesophageal reflux disease)     Hyperlipidemia    Hypertension    Hypothyroidism     Past Surgical History:  Procedure Laterality Date   APPENDECTOMY      Current Medications: Current Meds  Medication Sig   aspirin 325 MG tablet Take 650 mg by mouth as needed for mild pain or moderate pain.   calcium carbonate (OS-CAL) 600 MG TABS Take 600 mg by mouth daily.   chlorhexidine (PERIDEX) 0.12 % solution 15 mLs 2 (two) times daily.   diltiazem (CARDIZEM CD) 360 MG 24 hr capsule Take 1 capsule (360 mg total) by mouth daily.   levothyroxine (SYNTHROID) 112 MCG tablet Take 1 tablet (112 mcg total) by mouth daily.   Multiple Vitamin (MULTIVITAMIN) tablet Take 1 tablet by mouth daily.   nitroGLYCERIN (NITROSTAT) 0.4 MG SL tablet Place 1 tablet (0.4 mg total) under the tongue every 5 (five) minutes as needed.   simvastatin (ZOCOR) 40 MG tablet Take 1 tablet (40 mg total) by mouth at bedtime.   traZODone (DESYREL) 50 MG tablet Take 1 tablet (50 mg total) by mouth at bedtime.    Allergies:   Codeine   Social History   Socioeconomic History   Marital status: Married    Spouse name: Not on file   Number of children: Not on file   Years of education: Not on file  Highest education level: Not on file  Occupational History   Occupation: Field seismologist: [employer]  Tobacco Use   Smoking status: Former    Packs/day: 1.00    Years: 40.00    Pack years: 40.00    Types: Cigarettes    Quit date: 05/11/2009    Years since quitting: 11.6   Smokeless tobacco: Never  Substance and Sexual Activity   Alcohol use: Yes    Alcohol/week: 7.0 standard drinks    Types: 7 Standard drinks or equivalent per week   Drug use: No   Sexual activity: Not on file  Other Topics Concern   Not on file  Social History Narrative   Not on file   Social Determinants of Health   Financial Resource Strain: Not on file  Food Insecurity: Not on file  Transportation Needs: Not on file  Physical Activity: Not on file  Stress: Not  on file  Social Connections: Not on file     Family History:  The patient's family history includes Cancer in her mother; Coronary artery disease in her father and maternal grandmother.   ROS:   Please see the history of present illness.    ROS All other systems reviewed and are negative.  No flowsheet data found.     PHYSICAL EXAM:   VS:  BP 119/70   Pulse 64   Ht 5\' 6"  (1.676 m)   Wt 167 lb (75.8 kg)   SpO2 97%   BMI 26.95 kg/m    GEN: Well nourished, well developed, in no acute distress  HEENT: normal  Neck: no JVD, carotid bruits, or masses Cardiac: RRR; no murmurs, rubs, or gallops,no edema.  Intact distal pulses bilaterally.  Respiratory:  clear to auscultation bilaterally, normal work of breathing GI: soft, nontender, nondistended, + BS MS: no deformity or atrophy  Skin: warm and dry, no rash Neuro:  Alert and Oriented x 3, Strength and sensation are intact Psych: euthymic mood, full affect  Wt Readings from Last 3 Encounters:  12/22/20 167 lb (75.8 kg)  12/08/20 169 lb 3.2 oz (76.7 kg)  07/30/20 175 lb 3.2 oz (79.5 kg)      Studies/Labs Reviewed:   EKG:  EKG is ordered today.  The ekg ordered today demonstrates NSR at 64bpm with no ST changes  Recent Labs: 07/30/2020: TSH 1.780 12/08/2020: ALT 24; BUN 15; Creatinine, Ser 0.71; Hemoglobin 12.2; Platelets 289; Potassium 4.4; Sodium 142   Lipid Panel    Component Value Date/Time   CHOL 162 07/30/2020 0913   TRIG 63 07/30/2020 0913   HDL 68 07/30/2020 0913   CHOLHDL 2.4 07/30/2020 0913   CHOLHDL 2.1 07/16/2009 0020   VLDL 18 07/16/2009 0020   LDLCALC 82 07/30/2020 0913      Additional studies/ records that were reviewed today include:  Prior OV notes by Dr. Angelena Form and PCP notes    ASSESSMENT:    1. Atherosclerosis of native coronary artery of native heart without angina pectoris   2. Hypertension, essential   3. Mixed hyperlipidemia   4. Obstruction of carotid artery, unspecified laterality       PLAN:  In order of problems listed above:  Preoperative Clearance -she has a hx of mild non obstructive CAD by cath in 2010 with NSTEMI felt related to coronary vasospasm -she has not had any exertional CP -she does have problems with GERD from time to time that completely resolves after a PPI and belching and is nonexertional -she  has not had to take any nitrates or CCB in year for CP -she is able to achieve 6.79 mets on the Duke Activity Status Index -she is low risk for oral surgery with a 0.9% periop risk of major cardiac events but could be as high as 6.6% based on if surgery is considered high risk based on time under anesthesia which is planned for 12 hours.  -I will get an ETT given the length of scheduled surgery -Shared Decision Making/Informed Consent The risks [chest pain, shortness of breath, cardiac arrhythmias, dizziness, blood pressure fluctuations, myocardial infarction, stroke/transient ischemic attack, and life-threatening complications (estimated to be 1 in 10,000)], benefits (risk stratification, diagnosing coronary artery disease, treatment guidance) and alternatives of an exercise tolerance test were discussed in detail with Ms. Mitton and she agrees to proceed.   2.  ASCAD -s/p remote NSTEMI. Cardiac cath 07/16/09 with 20% LAD stenosis, 20% diffuse RCA stenosis, no Circumflex disease. Her event was felt to be secondary to coronary vasospasm.  -she denies any anginal sx and has not had to take any NTG in years -continue ASA, Cardizem and statin  3.  HTN -BP is well controlled on exam today -She will continue on Cardizem CD 360mg  daily  4.  HLD -LDL goal < 70 -I have personally reviewed and interpreted outside labs performed by patient's PCP which showed LDL 82, HDL 68 and ALT 24 in Jan 2022 -I will repeat FLP and if not at goal then will change Zocor to Atorvastatin   Followup with me in 1 year  Time Spent: 25 minutes total time of encounter, including 15  minutes spent in face-to-face patient care on the date of this encounter. This time includes coordination of care and counseling regarding above mentioned problem list. Remainder of non-face-to-face time involved reviewing chart documents/testing relevant to the patient encounter and documentation in the medical record. I have independently reviewed documentation from referring provider  Medication Adjustments/Labs and Tests Ordered: Current medicines are reviewed at length with the patient today.  Concerns regarding medicines are outlined above.  Medication changes, Labs and Tests ordered today are listed in the Patient Instructions below.  There are no Patient Instructions on file for this visit.   Signed, Fransico Him, MD  12/22/2020 10:05 AM    [address] [place], [place], [place]  [address] Phone: [phone]; Fax: [phone]

## 2020-12-25 DIAGNOSIS — C039 Malignant neoplasm of gum, unspecified: Secondary | ICD-10-CM | POA: Diagnosis not present

## 2020-12-29 ENCOUNTER — Other Ambulatory Visit: Payer: Medicare Other | Admitting: *Deleted

## 2020-12-29 ENCOUNTER — Other Ambulatory Visit: Payer: Self-pay

## 2020-12-29 ENCOUNTER — Ambulatory Visit (INDEPENDENT_AMBULATORY_CARE_PROVIDER_SITE_OTHER): Payer: Medicare Other

## 2020-12-29 DIAGNOSIS — I2510 Atherosclerotic heart disease of native coronary artery without angina pectoris: Secondary | ICD-10-CM | POA: Diagnosis not present

## 2020-12-29 DIAGNOSIS — I10 Essential (primary) hypertension: Secondary | ICD-10-CM | POA: Diagnosis not present

## 2020-12-29 DIAGNOSIS — E782 Mixed hyperlipidemia: Secondary | ICD-10-CM

## 2020-12-29 DIAGNOSIS — I6529 Occlusion and stenosis of unspecified carotid artery: Secondary | ICD-10-CM

## 2020-12-29 LAB — EXERCISE TOLERANCE TEST
Estimated workload: 7 METS
Exercise duration (min): 5 min
Exercise duration (sec): 30 s
MPHR: 153 {beats}/min
Peak HR: 100 {beats}/min
Percent HR: 65 %
RPE: 17
Rest HR: 66 {beats}/min

## 2020-12-29 LAB — LIPID PANEL
Chol/HDL Ratio: 2.2 ratio (ref 0.0–4.4)
Cholesterol, Total: 132 mg/dL (ref 100–199)
HDL: 59 mg/dL (ref >39)
LDL Chol Calc (NIH): 55 mg/dL (ref 0–99)
Triglycerides: 95 mg/dL (ref 0–149)
VLDL Cholesterol Cal: 18 mg/dL (ref 5–40)

## 2020-12-30 ENCOUNTER — Telehealth: Payer: Self-pay

## 2020-12-30 DIAGNOSIS — R079 Chest pain, unspecified: Secondary | ICD-10-CM

## 2020-12-30 NOTE — Telephone Encounter (Signed)
The patient has been notified of the result and verbalized understanding.  All questions (if any) were answered. Tammy Iba, RN 12/30/2020 1:59 PM  Tammy Brady has been ordered. Patient is aware she will be contacted to schedule test.

## 2020-12-30 NOTE — Telephone Encounter (Signed)
-----   Message from Sueanne Margarita, MD sent at 12/30/2020 10:11 AM EDT ----- Nondiagnostic stress test - please get lexiscan myoview

## 2021-01-01 NOTE — Telephone Encounter (Signed)
Shared Decision Making/Informed Consent The risks [chest pain, shortness of breath, cardiac arrhythmias, dizziness, blood pressure fluctuations, myocardial infarction, stroke/transient ischemic attack, nausea, vomiting, allergic reaction, radiation exposure, metallic taste sensation and life-threatening complications (estimated to be 1 in 10,000)], benefits (risk stratification, diagnosing coronary artery disease, treatment guidance) and alternatives of a nuclear stress test were discussed in detail with Tammy Brady and she agrees to proceed.

## 2021-01-06 ENCOUNTER — Telehealth (HOSPITAL_COMMUNITY): Payer: Self-pay | Admitting: *Deleted

## 2021-01-06 NOTE — Telephone Encounter (Signed)
Left message on voicemail per DPR in reference to upcoming appointment scheduled on 01/13/21 with detailed instructions given per Myocardial Perfusion Study Information Sheet for the test. LM to arrive 15 minutes early, and that it is imperative to arrive on time for appointment to keep from having the test rescheduled. If you need to cancel or reschedule your appointment, please call the office within 24 hours of your appointment. Failure to do so may result in a cancellation of your appointment, and a $50 no show fee. Phone number given for call back for any questions. Kirstie Peri

## 2021-01-07 ENCOUNTER — Other Ambulatory Visit: Payer: Self-pay | Admitting: Family Medicine

## 2021-01-07 DIAGNOSIS — E782 Mixed hyperlipidemia: Secondary | ICD-10-CM

## 2021-01-07 DIAGNOSIS — I10 Essential (primary) hypertension: Secondary | ICD-10-CM

## 2021-01-13 ENCOUNTER — Other Ambulatory Visit: Payer: Self-pay

## 2021-01-13 ENCOUNTER — Ambulatory Visit (HOSPITAL_COMMUNITY): Payer: Medicare Other | Attending: Cardiology

## 2021-01-13 DIAGNOSIS — R079 Chest pain, unspecified: Secondary | ICD-10-CM | POA: Diagnosis not present

## 2021-01-13 LAB — MYOCARDIAL PERFUSION IMAGING
LV dias vol: 66 mL (ref 46–106)
LV sys vol: 25 mL
SDS: 1
SRS: 0
SSS: 1
TID: 0.96

## 2021-01-13 MED ORDER — TECHNETIUM TC 99M TETROFOSMIN IV KIT
30.9000 | PACK | Freq: Once | INTRAVENOUS | Status: AC | PRN
  Administered 2021-01-13: 30.9 via INTRAVENOUS
  Filled 2021-01-13: qty 31

## 2021-01-13 MED ORDER — REGADENOSON 0.4 MG/5ML IV SOLN
0.4000 mg | Freq: Once | INTRAVENOUS | Status: AC
  Administered 2021-01-13: 0.4 mg via INTRAVENOUS

## 2021-01-13 MED ORDER — TECHNETIUM TC 99M TETROFOSMIN IV KIT
10.3000 | PACK | Freq: Once | INTRAVENOUS | Status: AC | PRN
Start: 2021-01-13 — End: 2021-01-13
  Administered 2021-01-13: 10.3 via INTRAVENOUS
  Filled 2021-01-13: qty 11

## 2021-01-14 ENCOUNTER — Encounter: Payer: Self-pay | Admitting: Physician Assistant

## 2021-01-14 ENCOUNTER — Ambulatory Visit (INDEPENDENT_AMBULATORY_CARE_PROVIDER_SITE_OTHER): Payer: Medicare Other | Admitting: Physician Assistant

## 2021-01-14 VITALS — BP 118/74 | HR 67 | Temp 96.8°F | Ht 66.0 in | Wt 166.4 lb

## 2021-01-14 DIAGNOSIS — E039 Hypothyroidism, unspecified: Secondary | ICD-10-CM | POA: Diagnosis not present

## 2021-01-14 DIAGNOSIS — C060 Malignant neoplasm of cheek mucosa: Secondary | ICD-10-CM | POA: Diagnosis not present

## 2021-01-14 DIAGNOSIS — E782 Mixed hyperlipidemia: Secondary | ICD-10-CM | POA: Diagnosis not present

## 2021-01-14 DIAGNOSIS — I10 Essential (primary) hypertension: Secondary | ICD-10-CM | POA: Diagnosis not present

## 2021-01-14 DIAGNOSIS — F419 Anxiety disorder, unspecified: Secondary | ICD-10-CM

## 2021-01-14 NOTE — Progress Notes (Signed)
Subjective:  Patient ID: Tammy Brady, female    DOB: 01/17/1951  Age: 70 y.o. MRN: [id number]  Chief Complaint  Patient presents with   Hypertension    Hypertension  Pt presents for follow up of hypertension.The patient is tolerating the medication well without side effects. Compliance with treatment has been good; including taking medication as directed , maintains a healthy diet and regular exercise regimen , and following up as directed.pt taking cardizem CD 360mg  qd - had recent cmp that was stabe one month ago She has just seen cardiology for surgical clearance and had dobutamine stress test yesterday  Mixed hyperlipidemia  Pt presents with hyperlipidemia.  Compliance with treatment has been good; The patient is compliant with medications, maintains a low cholesterol diet , follows up as directed , and maintains an exercise regimen . The patient denies experiencing any hypercholesterolemia related symptoms. Currently taking zocor 40mg  qd - recently had lipid panel done with cardiology few weeks ago  Pt with history of hypothyroidism - on synthroid 141mcg qd - due for labwork  Pt with newly diagnosed squamous cell carcinoma or oral mucosa - she is being followed by Dr Amada Jupiter with [facility] in [facility] --- extensive surgery is scheduled for this Monday 7/11-   Current Outpatient Medications on File Prior to Visit  Medication Sig Dispense Refill   acetaminophen (TYLENOL) 500 MG tablet Take 500 mg by mouth every 6 (six) hours as needed.     aspirin 325 MG tablet Take 650 mg by mouth as needed for mild pain or moderate pain.     calcium carbonate (OS-CAL) 600 MG TABS Take 600 mg by mouth daily.     diltiazem (CARDIZEM CD) 360 MG 24 hr capsule TAKE 1 CAPSULE BY MOUTH  DAILY 90 capsule 0   levothyroxine (SYNTHROID) 112 MCG tablet TAKE 1 TABLET BY MOUTH  DAILY 90 tablet 0   Multiple Vitamin (MULTIVITAMIN) tablet Take 1 tablet by mouth daily.     nitroGLYCERIN (NITROSTAT) 0.4 MG  SL tablet Place 1 tablet (0.4 mg total) under the tongue every 5 (five) minutes as needed. 25 tablet 6   simvastatin (ZOCOR) 40 MG tablet TAKE 1 TABLET BY MOUTH AT  BEDTIME 90 tablet 0   traZODone (DESYREL) 50 MG tablet Take 1 tablet (50 mg total) by mouth at bedtime. 30 tablet 1   No current facility-administered medications on file prior to visit.   Past Medical History:  Diagnosis Date   CAD (coronary artery disease)    Mild non-obstructive disease by cath January 2011   Carotid artery disease ([place])    Followed by Dr Kellie Simmering   GERD (gastroesophageal reflux disease)    Hyperlipidemia    Hypertension    Hypothyroidism    Past Surgical History:  Procedure Laterality Date   APPENDECTOMY      Family History  Problem Relation Age of Onset   Cancer Mother    Coronary artery disease Father    Coronary artery disease Maternal Grandmother    Social History   Socioeconomic History   Marital status: Married    Spouse name: Not on file   Number of children: Not on file   Years of education: Not on file   Highest education level: Not on file  Occupational History   Occupation: Dealer    Employer: [employer]  Tobacco Use   Smoking status: Former    Packs/day: 1.00    Years: 40.00    Pack years: 40.00  Types: Cigarettes    Quit date: 05/11/2009    Years since quitting: 11.6   Smokeless tobacco: Never  Substance and Sexual Activity   Alcohol use: Yes    Alcohol/week: 7.0 standard drinks    Types: 7 Standard drinks or equivalent per week   Drug use: No   Sexual activity: Not on file  Other Topics Concern   Not on file  Social History Narrative   Not on file   Social Determinants of Health   Financial Resource Strain: Not on file  Food Insecurity: Not on file  Transportation Needs: Not on file  Physical Activity: Not on file  Stress: Not on file  Social Connections: Not on file    Review of Systems CONSTITUTIONAL: Negative for chills, fatigue, fever,  unintentional weight gain and unintentional weight loss.  E/N/T: see HPI CARDIOVASCULAR: Negative for chest pain, dizziness, palpitations and pedal edema.  RESPIRATORY: Negative for recent cough and dyspnea.  GASTROINTESTINAL: Negative for abdominal pain, acid reflux symptoms, constipation, diarrhea, nausea and vomiting.  MSK: Negative for arthralgias and myalgias.  INTEGUMENTARY: Negative for rash.  NEUROLOGICAL: Negative for dizziness and headaches.  PSYCHIATRIC: Negative for sleep disturbance and to question depression screen.  Negative for depression, negative for anhedonia.       Objective:  BP 118/74 (BP Location: Left Arm, Patient Position: Sitting, Cuff Size: Normal)   Pulse 67   Temp (!) 96.8 F (36 C) (Temporal)   Ht 5\' 6"  (1.676 m)   Wt 166 lb 6.4 oz (75.5 kg)   SpO2 96%   BMI 26.86 kg/m   BP/Weight 01/14/2021 12/22/2020 11/18/2583  Systolic BP 277 824 235  Diastolic BP 74 70 82  Wt. (Lbs) 166.4 167 169.2  BMI 26.86 26.95 27.31    Physical Exam PHYSICAL EXAM:   VS: BP 118/74 (BP Location: Left Arm, Patient Position: Sitting, Cuff Size: Normal)   Pulse 67   Temp (!) 96.8 F (36 C) (Temporal)   Ht 5\' 6"  (1.676 m)   Wt 166 lb 6.4 oz (75.5 kg)   SpO2 96%   BMI 26.86 kg/m   GEN: Well nourished, well developed, in no acute distress  Oropharynx - mucosa with abnormality/growth on lower jaw Neck: no JVD or masses - no thyromegaly Cardiac: RRR; no murmurs, rubs, or gallops,no edema - no significant varicosities Respiratory:  normal respiratory rate and pattern with no distress - normal breath sounds with no rales, rhonchi, wheezes or rubs MS: no deformity or atrophy  Skin: warm and dry, no rash  Neuro:  Alert and Oriented x 3, Strength and sensation are intact - CN II-Xii grossly intact Psych: euthymic mood, appropriate affect and demeanor   Diabetic Foot Exam - Simple   No data filed      Lab Results  Component Value Date   WBC 9.8 12/08/2020   HGB 12.2  12/08/2020   HCT 36.7 12/08/2020   PLT 289 12/08/2020   GLUCOSE 89 12/08/2020   CHOL 132 12/29/2020   TRIG 95 12/29/2020   HDL 59 12/29/2020   LDLCALC 55 12/29/2020   ALT 24 12/08/2020   AST 39 12/08/2020   NA 142 12/08/2020   K 4.4 12/08/2020   CL 103 12/08/2020   CREATININE 0.71 12/08/2020   BUN 15 12/08/2020   CO2 22 12/08/2020   TSH 1.780 07/30/2020   INR 0.99 07/16/2009   HGBA1C  07/15/2009    5.1 (NOTE) The ADA recommends the following therapeutic goal for glycemic control related to  Hgb A1c measurement: Goal of therapy: <6.5 Hgb A1c  Reference: American Diabetes Association: Clinical Practice Recommendations 2010, Diabetes Care, 2010, 33: (Suppl  1).      Assessment & Plan:   1. Acquired hypothyroidism - TSH Continue meds as directed 2. Mixed hyperlipidemia Continue meds and watch diet  3. Hypertension, essential Continue meds 4. Anxiety Continue meds 5. 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   Orders Placed This Encounter  Procedures   TSH       Follow-up: Return for follow up chronic visit in October.  An After Visit Summary was printed and given to the patient.  Yetta Flock [facility] [phone]

## 2021-01-15 LAB — TSH: TSH: 1.53 u[IU]/mL (ref 0.450–4.500)

## 2021-01-18 DIAGNOSIS — Z452 Encounter for adjustment and management of vascular access device: Secondary | ICD-10-CM | POA: Diagnosis not present

## 2021-01-18 DIAGNOSIS — C760 Malignant neoplasm of head, face and neck: Secondary | ICD-10-CM | POA: Diagnosis not present

## 2021-01-18 DIAGNOSIS — J302 Other seasonal allergic rhinitis: Secondary | ICD-10-CM | POA: Diagnosis not present

## 2021-01-18 DIAGNOSIS — I25119 Atherosclerotic heart disease of native coronary artery with unspecified angina pectoris: Secondary | ICD-10-CM | POA: Diagnosis not present

## 2021-01-18 DIAGNOSIS — Z98818 Other dental procedure status: Secondary | ICD-10-CM | POA: Diagnosis not present

## 2021-01-18 DIAGNOSIS — C039 Malignant neoplasm of gum, unspecified: Secondary | ICD-10-CM | POA: Diagnosis not present

## 2021-01-18 DIAGNOSIS — F32A Depression, unspecified: Secondary | ICD-10-CM | POA: Diagnosis not present

## 2021-01-18 DIAGNOSIS — J984 Other disorders of lung: Secondary | ICD-10-CM | POA: Diagnosis not present

## 2021-01-18 DIAGNOSIS — D1039 Benign neoplasm of other parts of mouth: Secondary | ICD-10-CM | POA: Diagnosis not present

## 2021-01-18 DIAGNOSIS — D101 Benign neoplasm of tongue: Secondary | ICD-10-CM | POA: Diagnosis not present

## 2021-01-18 DIAGNOSIS — I10 Essential (primary) hypertension: Secondary | ICD-10-CM | POA: Diagnosis not present

## 2021-01-18 DIAGNOSIS — C44329 Squamous cell carcinoma of skin of other parts of face: Secondary | ICD-10-CM | POA: Diagnosis not present

## 2021-01-18 DIAGNOSIS — C049 Malignant neoplasm of floor of mouth, unspecified: Secondary | ICD-10-CM | POA: Diagnosis not present

## 2021-01-18 DIAGNOSIS — Z87891 Personal history of nicotine dependence: Secondary | ICD-10-CM | POA: Diagnosis not present

## 2021-01-18 DIAGNOSIS — I499 Cardiac arrhythmia, unspecified: Secondary | ICD-10-CM | POA: Diagnosis not present

## 2021-01-18 DIAGNOSIS — C411 Malignant neoplasm of mandible: Secondary | ICD-10-CM | POA: Diagnosis not present

## 2021-01-18 DIAGNOSIS — R6339 Other feeding difficulties: Secondary | ICD-10-CM | POA: Diagnosis not present

## 2021-01-18 DIAGNOSIS — K219 Gastro-esophageal reflux disease without esophagitis: Secondary | ICD-10-CM | POA: Diagnosis not present

## 2021-01-18 DIAGNOSIS — J9811 Atelectasis: Secondary | ICD-10-CM | POA: Diagnosis not present

## 2021-01-18 DIAGNOSIS — Z79899 Other long term (current) drug therapy: Secondary | ICD-10-CM | POA: Diagnosis not present

## 2021-01-18 DIAGNOSIS — Z4682 Encounter for fitting and adjustment of non-vascular catheter: Secondary | ICD-10-CM | POA: Diagnosis not present

## 2021-01-18 DIAGNOSIS — C031 Malignant neoplasm of lower gum: Secondary | ICD-10-CM | POA: Diagnosis not present

## 2021-01-18 DIAGNOSIS — Z430 Encounter for attention to tracheostomy: Secondary | ICD-10-CM | POA: Diagnosis not present

## 2021-01-18 DIAGNOSIS — R918 Other nonspecific abnormal finding of lung field: Secondary | ICD-10-CM | POA: Diagnosis not present

## 2021-01-18 DIAGNOSIS — C060 Malignant neoplasm of cheek mucosa: Secondary | ICD-10-CM | POA: Diagnosis not present

## 2021-01-18 DIAGNOSIS — E039 Hypothyroidism, unspecified: Secondary | ICD-10-CM | POA: Diagnosis not present

## 2021-01-18 DIAGNOSIS — C770 Secondary and unspecified malignant neoplasm of lymph nodes of head, face and neck: Secondary | ICD-10-CM | POA: Diagnosis not present

## 2021-01-18 DIAGNOSIS — R1310 Dysphagia, unspecified: Secondary | ICD-10-CM | POA: Diagnosis not present

## 2021-01-18 HISTORY — PX: EXCISION ORAL TUMOR: SHX6265

## 2021-01-27 ENCOUNTER — Ambulatory Visit: Payer: Medicare Other | Admitting: Physician Assistant

## 2021-02-05 DIAGNOSIS — Z09 Encounter for follow-up examination after completed treatment for conditions other than malignant neoplasm: Secondary | ICD-10-CM | POA: Diagnosis not present

## 2021-02-05 DIAGNOSIS — C031 Malignant neoplasm of lower gum: Secondary | ICD-10-CM | POA: Diagnosis not present

## 2021-02-05 DIAGNOSIS — R1310 Dysphagia, unspecified: Secondary | ICD-10-CM | POA: Diagnosis not present

## 2021-02-05 DIAGNOSIS — C060 Malignant neoplasm of cheek mucosa: Secondary | ICD-10-CM | POA: Diagnosis not present

## 2021-02-05 DIAGNOSIS — J620 Pneumoconiosis due to talc dust: Secondary | ICD-10-CM | POA: Diagnosis not present

## 2021-02-05 DIAGNOSIS — R2689 Other abnormalities of gait and mobility: Secondary | ICD-10-CM | POA: Diagnosis not present

## 2021-02-08 ENCOUNTER — Telehealth: Payer: Self-pay | Admitting: Hematology and Oncology

## 2021-02-08 ENCOUNTER — Other Ambulatory Visit: Payer: Self-pay | Admitting: Radiation Oncology

## 2021-02-08 ENCOUNTER — Encounter: Payer: Self-pay | Admitting: Physician Assistant

## 2021-02-08 ENCOUNTER — Ambulatory Visit
Admission: RE | Admit: 2021-02-08 | Discharge: 2021-02-08 | Disposition: A | Discharge status: Home / Self Care | Payer: Self-pay | Source: Ambulatory Visit | Attending: Radiation Oncology | Admitting: Radiation Oncology

## 2021-02-08 DIAGNOSIS — C760 Malignant neoplasm of head, face and neck: Secondary | ICD-10-CM

## 2021-02-08 NOTE — Telephone Encounter (Signed)
Received a new pt referral from Dr. Amada Jupiter for head and neck cancer Tammy Brady has been cld and scheduled to see Dr. Chryl Heck on 8/16 at 11:20am. Pt aware to arrive 20 minutes early.

## 2021-02-11 ENCOUNTER — Encounter: Payer: Self-pay | Admitting: Physician Assistant

## 2021-02-11 ENCOUNTER — Other Ambulatory Visit: Payer: Self-pay | Admitting: Physician Assistant

## 2021-02-11 DIAGNOSIS — F419 Anxiety disorder, unspecified: Secondary | ICD-10-CM

## 2021-02-11 MED ORDER — TRAZODONE HCL 50 MG PO TABS: 50.0000 mg | ORAL_TABLET | Freq: Every day | ORAL | 1 refills | Status: DC

## 2021-02-12 DIAGNOSIS — Z4659 Encounter for fitting and adjustment of other gastrointestinal appliance and device: Secondary | ICD-10-CM | POA: Diagnosis not present

## 2021-02-12 DIAGNOSIS — Z09 Encounter for follow-up examination after completed treatment for conditions other than malignant neoplasm: Secondary | ICD-10-CM | POA: Diagnosis not present

## 2021-02-17 ENCOUNTER — Ambulatory Visit
Admission: RE | Admit: 2021-02-17 | Discharge: 2021-02-17 | Disposition: A | Discharge status: Home / Self Care | Payer: Self-pay | Source: Ambulatory Visit | Attending: Radiation Oncology | Admitting: Radiation Oncology

## 2021-02-17 ENCOUNTER — Other Ambulatory Visit: Payer: Self-pay | Admitting: Physician Assistant

## 2021-02-17 ENCOUNTER — Other Ambulatory Visit: Payer: Self-pay

## 2021-02-17 DIAGNOSIS — C060 Malignant neoplasm of cheek mucosa: Secondary | ICD-10-CM

## 2021-02-17 DIAGNOSIS — F419 Anxiety disorder, unspecified: Secondary | ICD-10-CM

## 2021-02-17 NOTE — Progress Notes (Signed)
Oncology Nurse Navigator Documentation   Placed introductory call to new referral patient Tammy Brady. Introduced myself as the H&N oncology nurse navigator that works with Dr. Isidore Moos and Dr. Chryl Heck to whom she has been referred by Dr. Amada Jupiter. She confirmed understanding of referral. Briefly explained my role as her navigator, provided my contact information.  Confirmed understanding of upcoming appts and [place] location, explained arrival and registration process. I explained the purpose of a dental evaluation prior to starting RT, indicated she may be contacted by [facility] to arrange an appt based on Dr. Pearlie Oyster assessment.  I encouraged her to call with questions/concerns as he moves forward with appts and procedures.   She verbalized understanding of information provided, expressed appreciation for my call.   Navigator Initial Assessment Employment Status: she is retired Currently on [employer] / STD: no Living Situation: She lives with her husband Support System: husband, family PCP: Marge Duncans PA PCD: Financial Concerns: not at this time Transportation Needs: no Sensory Deficits: no Language Barriers/Interpreter Needed:  no Ambulation Needs: no DME Used in Home: no Psychosocial Needs:  no Concerns/Needs Understanding Cancer:  addressed/answered by navigator to best of ability Self-Expressed Needs: no   Clinical biochemist, BSN, OCN Head & Neck Oncology Nurse [place] [facility] Phone # [phone]  Fax # [phone]

## 2021-02-18 NOTE — Progress Notes (Signed)
Head and Neck Cancer Location of Tumor / Histology:  Squamous cell carcinoma of LEFT mandible, p16(-)  Patient presented with symptoms of: first noticed the lesion to her gums near the end of 2021. The patient presented to her dentist in May 2022 for the lesion and was referred to an oral surgeon for a biopsy. Further endorses pain to her gums, for which she is taking aspirin and tylenol every 4 hours.    Biopsies revealed:  01/18/2021 Diagnosis   A: Mouth, RMT mucosal margin, excision - Benign squamous mucosa and submucosa with no tumor seen. B: Mouth, left buccal margin, excision - Benign squamous mucosa and submucosa with no tumor seen. C: Mouth, labial mucosal margin, excision - Benign squamous mucosa and submucosa with no tumor seen. D: Mouth, anterior gingival mucosal margin, excision - Benign squamous mucosa and submucosa with no tumor seen. E: Mouth, floor of mouth, biopsy - Benign squamous mucosa and submucosa with no tumor seen. F: Tongue, ventral tongue, biopsy - Benign squamous mucosa and submucosa with no tumor seen. G: Bone marrow, anterior mandible, biopsy - Fragment of blood clot, no tumor seen, on original frozen section only, tissue has been cut through on permanent sections. H: Bone marrow, posterior mandible, biopsy - Fragment of blood clot, no tumor seen, on original frozen section only, tissue has been cut through on permanent sections. I: Lymph node, left neck level 1B and level 1A, lymphadenectomy - Metastatic squamous cell carcinoma with cystic change, involving 2 of 4 lymph nodes (2/4), size of largest metastasis 2.2 cm. - Benign submandibular gland is also present. J: Lymph node, right level 1B, lymphadenectomy - Metastatic squamous cell carcinoma involving 2 of 4 lymph nodes (2/4), with cystic change, size of largest metastasis 2.0 cm in greatest dimension, with extranodal extension of tumor identified. - Submandibular gland present with no tumor seen. K:  Lymph node, left EJ, excision - No tumor seen in three lymph nodes (0/3). L: Lymph node, left neck, level 2, lymphadenectomy - No tumor seen in 14 lymph nodes (0/14). M: Lymph node, left neck, level 3, lymphadenectomy - Metastatic squamous cell carcinoma involving 1 of 7 lymph nodes (1/7), size of metastasis 2.2 cm, with extranodal extension of tumor identified. N: Lymph node, left neck, level 4, lymphadenectomy - Metastatic squamous cell carcinoma involving 1 of 2 lymph nodes (1/2), size of metastasis 1 mm. O: Lymph node, right neck, level 2, lymphadenectomy - No tumor seen in eight lymph nodes (0/8). P: Lymph node, right neck, level 3, lymphadenectomy - No tumor seen in four lymph nodes (0/4). Q: Lymph node, right neck, level 4, lymphadenectomy - No tumor seen in three lymph nodes (0/3). R: Left composite mandibular resection (segmental mandibulectomy and resection of submental gland) - Squamous cell carcinoma, moderately differentiated, of alveolar ridge extending to buccal gingival junction and lingual sulcus. - Tumor size 3.3 cm in greatest lateral dimension, depth of invasion 1.1 cm. - Tumor involves the alveolar bone and does not exceed the depth of the roots of the teeth, and does not extend into the body of the mandible.  - Surgical margins free of tumor (soft tissue and bone margins all negative for tumor) - Submental gland present with no tumor seen. S: Teeth, extraction - Multiple intact and fragmented teeth (gross examination only).   CT Neck w/ Contrast 12/14/2020 --IMPRESSION:  Enhancing soft tissue lesion along the buccal margin of the left mandible with lucency in the adjacent bone consistent with the clinically evident squamous cell carcinoma.  Necrotic left level IA/B and II/IIIB lymph nodes concerning for nodal metastasis  Nutrition Status Yes No Comments  Weight changes? '[]'$  '[x]'$    Swallowing concerns? '[x]'$  '[]'$  Takes most nutrition and medication through PEG; able to  tolerate pureed foods but reconstructive flap can limit swallowing capability  PEG? '[x]'$  '[]'$  01/21/2021   Referrals Yes No Comments  Social Work? '[x]'$  '[]'$    Dentistry? '[x]'$  '[]'$    Swallowing therapy? '[x]'$  '[]'$    Nutrition? '[x]'$  '[]'$    Med/Onc? '[x]'$  '[]'$  Dr. Arletha Pili Iruku   Safety Issues Yes No Comments  Prior radiation? '[]'$  '[x]'$    Pacemaker/ICD? '[]'$  '[x]'$    Possible current pregnancy? '[]'$  '[x]'$  Postmenopausal  Is the patient on methotrexate? '[]'$  '[x]'$     Tobacco/Marijuana/Snuff/ETOH use: Former smoker; 0.5-1 pack/day for 40 years. Denies any smokeless tobacco use, alcohol consumption, or recreational drug use  Past/Anticipated interventions by otolaryngology, if any:  01/18/2021 --Dr. Ileene Rubens ([place]) Segmental mandibulectomy with LEFT fibular mandible reconstruction CERVICAL LYMPHADENECTOMY (MODIFIED RADICAL NECK DISSECTION EXC OF BENIGN TUMOR OR CYST OF MANDIBLE; REQ INTRA-ORAL OSTEOTOMY --Dr. Vira Browns Left fibula osteocutaneous free flap ORIF mandible Right external carotid exploration Split thickness skin graft Application of wound vac Tracheostomy tube exchange prior to established tract  Past/Anticipated interventions by medical oncology, if any:  Scheduled for consult with Dr. Alphonzo Severance Iruku on 02/23/2021  Current Complaints / other details:  Patient has received the first 2 Moderna vaccines, and recovered from COLID-q9 this past June

## 2021-02-18 NOTE — Progress Notes (Signed)
Radiation Oncology         [phone] ________________________________  Initial Outpatient Consultation  Name: Tammy Brady MRN: [id number]  Date: 02/19/2021  DOB: Oct 11, 1950  XB:WIOMB, Tammy Cruz, PA-C  Edgar Frisk, MD   REFERRING PHYSICIAN: Edgar Frisk, MD  DIAGNOSIS:    ICD-10-CM   1. 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  HISTORY OF PRESENT ILLNESS::Tammy Brady is a 70 y.o. female who presented to her dentist with a painful lesion to her gums; first noticed near the end of 2021. The patient was accordingly referred to [facility]. Biopsy of the lesion performed on 11/26/20 revealed squamous cell carcinoma of the left mandible. The patient soon after met with her primary care provider, Tammy Duncans PA-C, on 12/08/20 who noted the patient to report having bad dentition, gingival irritation, and loose teeth.    Subsequently, Dr. Rosana Hoes referred the patient to Dr. Amada Jupiter on 12/14/20 who discussed surgical interventions with the patient . Neck CT performed during this visit revealed an enhancing soft tissue lesion along the buccal margin of the left mandible; with lucency in the adjacent bone consistent with known SCC. Also found were necrotic left level IA/B and II/IIIB lymph nodes, noted to be concerning for nodal metastasis. During physical exam performed at this visit, Dr. Amada Jupiter visualized the exophytic mass in and around the left mandibular canine, premolar, and first molar, along with an enlarged level 1A lymph node. The patient opted  to proceed with multiple oral biopsies, mandibular resectioning, and lymphadenectomies.   Chest CT negative for metastases  Resection performed on 01/18/21 revealed the following: 3.3cm tumor, Gr 2 Squamous cell carcinoma of alveolar ridge extending to buccal gingival junction and lingual sulcus, DOI 71m, neg margins, 6/49 LN+ bilaterally with ECE; no PNI or LVSI; see path report below.  Also underwent Left fibula osteocutaneous free flap ORIF mandible  Of note: the patient additionally underwent laparoscopic gastrotomy tube placement on 01/18/21. The patient reported leakage from her G-tube medicine port and difficulty dressing the area around her anchor sutures several weeks after procedure. Otherwise, she is noted to be recovering well from her procedures, and adapting well to using G-tube. (G-tube was exchanged on 02/12/21 due to port leakage).    Pain status: Oral pain causing sleep troubles.  Other symptoms: Anxiety due to Dx/oral pain (prescribed trazodone 559mby PCP), trouble sleeping, overall poor dentition, heartburn and reflux symptoms (takes pepcid).  Tobacco history, if any: Former smoker, reports 40 pack years (0.5 packs per day); quit since diagnosis. Past history of alcohol use.  Not currently consuming ETOH    She is retired, worked as an acOptometristntil early 2022.   PATHOLOGY REPORT: Diagnosis    A: Mouth, RMT mucosal margin, excision - Benign squamous mucosa and submucosa with no tumor seen.   B: Mouth, left buccal margin, excision - Benign squamous mucosa and submucosa with no tumor seen.   C: Mouth, labial mucosal margin, excision - Benign squamous mucosa and submucosa with no tumor seen.   D: Mouth,  anterior gingival mucosal margin, excision - Benign squamous mucosa and submucosa with no tumor seen.   E: Mouth, floor of mouth, biopsy - Benign squamous mucosa and submucosa with no tumor seen.   F: Tongue, ventral tongue, biopsy - Benign squamous  mucosa and submucosa with no tumor seen.   G: Bone marrow, anterior mandible, biopsy - Fragment of blood clot, no tumor seen, on original frozen section only, tissue has been cut through on permanent sections.   H: Bone marrow, posterior mandible, biopsy - Fragment of blood clot, no tumor seen, on original frozen section only, tissue has been cut through on permanent sections.   I: Lymph node, left neck level 1B and level 1A, lymphadenectomy - Metastatic squamous cell carcinoma with cystic change, involving 2 of 4 lymph nodes (2/4), size of largest metastasis 2.2 cm. - Benign submandibular gland is also present.   J: Lymph node, right level 1B, lymphadenectomy - Metastatic squamous cell carcinoma involving 2 of 4 lymph nodes (2/4), with cystic change, size of largest metastasis 2.0 cm in greatest dimension, with extranodal extension of tumor identified. - Submandibular gland present with no tumor seen.   K: Lymph node, left EJ, excision - No tumor seen in three lymph nodes (0/3).   L: Lymph node, left neck, level 2, lymphadenectomy - No tumor seen in 14 lymph nodes (0/14).   M: Lymph node, left neck, level 3, lymphadenectomy - Metastatic squamous cell carcinoma involving 1 of 7 lymph nodes (1/7), size of metastasis 2.2 cm, with extranodal extension of tumor identified.   N: Lymph node, left neck, level 4, lymphadenectomy - Metastatic squamous cell carcinoma involving 1 of 2 lymph nodes (1/2), size of metastasis 1 mm.   O: Lymph node, right neck, level 2, lymphadenectomy - No tumor seen in eight lymph nodes (0/8).   P: Lymph node, right neck, level 3, lymphadenectomy - No tumor seen in four lymph nodes (0/4).   Q: Lymph node, right neck, level 4, lymphadenectomy - No tumor seen in three lymph nodes (0/3).   R: Left composite mandibular resection (segmental mandibulectomy and resection of submental gland) - Squamous cell carcinoma, moderately differentiated, of alveolar ridge  extending to buccal gingival junction and lingual sulcus. - Tumor size 3.3 cm in greatest lateral dimension, depth of invasion 1.1 cm. - Tumor involves the alveolar bone and does not exceed the depth of the roots of the teeth, and does not extend into the body of the mandible.  - Surgical margins free of tumor (soft tissue and bone margins all negative for tumor) - Submental gland present with no tumor seen.   S: Teeth, extraction - Multiple intact and fragmented teeth (gross examination only).    This electronic signature is attestation that the pathologist personally reviewed the submitted material(s) and the final diagnosis reflects that evaluation.  Amendment electronically signed by Donia Pounds, MD on 02/01/2021 at  2:24 PM Electronically signed by Jacklynn Bue Maygarden, MD on 01/25/2021 at  5:04 PM  Diagnosis Comment    Addendum 02/01/2021 to complete staging. Additional sections show the tumor extending into the alveolar bone but not into the body of the mandible. The synoptic template has been completed.   Synoptic Report  ORAL CAVITY  LIP AND ORAL CAVITY: INCISIONAL BX, EXCISIONAL BX, RESECTION - R  8th Edition - Protocol posted: 06/26/2020   SPECIMEN     Procedure:    Mandibulectomy: left segmental mandibulectomy   TUMOR     Tumor Focality:  Unifocal     Tumor Site:    Oral cavity       Tumor Subsite:    Alveolar process, mandibular: left mandible, spanning teeth 19-25     Tumor Laterality:    Left     Tumor Size:    Greatest Dimension (Centimeters): 3.3 cm     Histologic Type:           :    Squamous cell carcinoma, conventional     Histologic Grade:    G2, moderately differentiated     Tumor Depth of Invasion (DOI):    11 mm     Lymphovascular Invasion:    Not identified     Perineural Invasion:    Not identified   MARGINS     Specimen Margin Status for Invasive Tumor:    All specimen margins negative for invasive tumor       Distance from Invasive Tumor to  Closest Specimen Margin:    5 mm       Closest Specimen Margin(s) to Invasive Tumor:    lingual soft tissue     Tumor Bed Margin Status:           Tumor Bed Margin Orientation:    Unoriented to true margin surface       Tumor Bed Margin Status for Invasive Tumor:    All tumor bed margins negative for invasive tumor   REGIONAL LYMPH NODES     Regional Lymph Node Status:           :    Tumor present in regional lymph node(s)         Number of Lymph Nodes with Tumor:    6         Laterality of Lymph Node(s) with Tumor:    Bilateral         Size of Largest Nodal Metastatic Deposit:    2.2 cm         Extranodal Extension (ENE):    Present           Distance of ENE from Lymph Node Capsule:    Less than 1 mm       Number of Lymph Nodes Examined:    49   PATHOLOGIC STAGE CLASSIFICATION (pTNM, AJCC 8th Edition)     Reporting of pT, pN, and (when applicable) pM categories is based on information available to the pathologist at the time the report is issued. As per the AJCC (Chapter 1, 8th Ed.) it is the managing physician's responsibility to establish the final pathologic stage based upon all pertinent information, including but potentially not limited to this pathology report.     pT Category:    pT3     pN Category:    pN3b    PREVIOUS RADIATION THERAPY: No  PAST MEDICAL HISTORY:  has a past medical history of CAD (coronary artery disease), Carotid artery disease ([place]), GERD (gastroesophageal reflux disease), Hyperlipidemia, Hypertension, and Hypothyroidism.    PAST SURGICAL HISTORY: Past Surgical History:  Procedure Laterality Date   APPENDECTOMY      FAMILY HISTORY: family history includes Cancer in her mother; Coronary artery disease in her father and maternal grandmother.  SOCIAL HISTORY:  reports that she quit smoking about 11 years ago. Her smoking use included cigarettes. She has a 40.00 pack-year smoking history. She has never used smokeless tobacco. She reports that she does not  currently use alcohol. She reports that she does not use drugs.  ALLERGIES: Codeine  MEDICATIONS:  Current Outpatient Medications  Medication Sig Dispense Refill   Nutritional Supplements (FEEDING SUPPLEMENT, OSMOLITE 1.5 CAL,) LIQD Place into feeding tube daily. 4-5 cartons daily     acetaminophen (TYLENOL) 500 MG tablet Take 500 mg by mouth every 6 (six) hours as needed.     amoxicillin-clavulanate (AUGMENTIN) 875-125 MG tablet Take 1 tablet by mouth 2 (two) times daily.     aspirin 325 MG tablet Take 650 mg by mouth as needed for mild pain or moderate pain.     calcium carbonate (OS-CAL) 600 MG TABS Take 600 mg by mouth daily.     diltiazem (CARDIZEM CD) 360 MG 24 hr capsule TAKE 1 CAPSULE BY MOUTH  DAILY 90 capsule 0   gabapentin (NEURONTIN) 300 MG capsule Take 300 mg by mouth 3 (three) times daily.     levothyroxine (SYNTHROID) 112 MCG tablet TAKE 1 TABLET BY MOUTH  DAILY 90 tablet 0   Multiple Vitamin (MULTIVITAMIN) tablet Take 1 tablet by mouth daily.     nitroGLYCERIN (NITROSTAT) 0.4 MG SL tablet Place 1 tablet (0.4 mg total) under the tongue every 5 (five) minutes as needed. 25 tablet 6   oxyCODONE (ROXICODONE) 5 MG/5ML solution Take 5 mg by mouth every 6 (six) hours as needed.     PERIOGARD 0.12 % solution 30 mLs by Mouth Rinse route daily.     simvastatin (ZOCOR) 40 MG tablet TAKE 1 TABLET BY MOUTH AT  BEDTIME 90 tablet 0   traZODone (DESYREL) 50 MG tablet Take 1 tablet (50 mg total) by mouth at bedtime. 90 tablet 1   No current facility-administered medications for this encounter.    REVIEW OF SYSTEMS:  Notable for that above.   PHYSICAL EXAM:  height is 5' 6" (1.676 m) and weight is 162 lb (73.5 kg). Her temporal temperature is 96.8 F (36 C) (abnormal). Her blood pressure is 133/58 (abnormal) and her pulse is 67. Her respiration is 18 and oxygen saturation is 97%.   General: Alert and oriented, in no acute distress HEENT: Head is normocephalic. Extraocular movements are  intact. Oropharynx is notable for s/p partial mandibular resection and reconstruction.  Tissue free flap in anterior oral cavity is healing well, no evidence of tumor recurrence, no thrush. Neck: Neck is notable for satisfactory healing from neck dissections, no adenopathy palpated; + lymphedema above scars Heart: Regular in rate and rhythm with no murmurs, rubs, or gallops. Chest: Clear to auscultation bilaterally, with no rhonchi, wheezes, or rales. Abdomen: + PEG tube, intact Extremities: No cyanosis or edema. Lymphatics: see Neck Exam Skin: No concerning lesions. Neurologic: Cranial nerves II through XII are grossly intact. No obvious focalities. Speech is fluent. Coordination is intact. Psychiatric: Judgment and insight are intact. Affect is appropriate.   ECOG = 1  0 - Asymptomatic (Fully active, able to carry on all predisease activities without restriction)  1 - Symptomatic but completely ambulatory (Restricted in physically strenuous activity but ambulatory and able to carry out work of a light or sedentary nature. For example, light housework, office work)  2 - Symptomatic, <50% in bed during the day (Ambulatory and capable of all self care but unable to carry out any work activities. Up and about more than 50% of waking hours)  3 - Symptomatic, >50% in bed, but not bedbound (Capable of only limited self-care, confined to bed or chair 50% or more of waking hours)  4 - Bedbound (Completely disabled. Cannot carry on any self-care. Totally confined to bed or  chair)  5 - Death   Eustace Pen MM, Creech RH, Tormey DC, et al. [phone]). "Toxicity and response criteria of the [facility]". [place] Oncol. 5 (6): 649-55   LABORATORY DATA:  Lab Results  Component Value Date   WBC 9.8 12/08/2020   HGB 12.2 12/08/2020   HCT 36.7 12/08/2020   MCV 93 12/08/2020   PLT 289 12/08/2020   CMP     Component Value Date/Time   NA 142 12/08/2020 1356   K 4.4 12/08/2020  1356   CL 103 12/08/2020 1356   CO2 22 12/08/2020 1356   GLUCOSE 89 12/08/2020 1356   GLUCOSE 87 07/17/2009 0510   BUN 15 12/08/2020 1356   CREATININE 0.71 12/08/2020 1356   CALCIUM 8.7 12/08/2020 1356   PROT 6.3 12/08/2020 1356   ALBUMIN 4.2 12/08/2020 1356   AST 39 12/08/2020 1356   ALT 24 12/08/2020 1356   ALKPHOS 84 12/08/2020 1356   BILITOT <0.2 12/08/2020 1356   GFRNONAA 72 08/26/2020 0927   GFRAA 83 08/26/2020 0927      Lab Results  Component Value Date   TSH 1.530 01/14/2021     RADIOGRAPHY: as above, I personally viewed her imaging  IMPRESSION/PLAN:  This is a delightful patient with gingival/mandibular locally advanced head and neck cancer. I do recommend radiotherapy for this patient: 6 wks of post operative RT to the oral cavity and b/l neck.  We discussed the potential risks, benefits, and side effects of radiotherapy. We talked in detail about acute and late effects. We discussed that some of the most bothersome acute effects may be mucositis, dysgeusia, salivary changes, skin irritation, hair loss, dehydration, weight loss and fatigue. We talked about late effects which include but are not necessarily limited to dysphagia, hypothyroidism, nerve injury, vascular injury, dental or mandibular injury, spinal cord injury, xerostomia, trismus, neck edema, and potential injury to any of the tissues in the head and neck region. No guarantees of treatment were given. A consent form was signed and placed in the patient's medical record. The patient is enthusiastic about proceeding with treatment. I look forward to participating in the patient's care.    Simulation (treatment planning) will take place on Aug 22, start RT Aug 29 pending release by ENT and dentistry  We also discussed that the treatment of head and neck cancer is a multidisciplinary process to maximize treatment outcomes and quality of life. For this reason the following referrals have been or will be made:    Medical oncology to discuss chemotherapy - anticipate concurrent chemotherapy given ECE   Dentistry for dental evaluation, possible extractions in the radiation fields (unlikely given timeline to plan and start RT soon), and /or advice on reducing risk of cavities, osteoradionecrosis, or other oral issues.   Nutritionist for nutrition support during and after treatment.   Speech language pathology for swallowing and/or speech therapy.   Social work for social support.    Physical therapy due to risk of lymphedema in neck and deconditioning.   Baseline labs including TSH.  On date of service, in total, I spent 60 minutes on this encounter. Patient was seen in person.  __________________________________________   Eppie Gibson, MD  This document serves as a record of services personally performed by Eppie Gibson, MD. It was created on her behalf by Roney Mans, a trained medical scribe. The creation of this record is based on the scribe's personal observations and the provider's statements to them. This document has been  checked and approved by the attending provider.

## 2021-02-19 ENCOUNTER — Ambulatory Visit
Admission: RE | Admit: 2021-02-19 | Discharge: 2021-02-19 | Disposition: A | Discharge status: Home / Self Care | Payer: Medicare Other | Source: Ambulatory Visit | Attending: Radiation Oncology | Admitting: Radiation Oncology

## 2021-02-19 ENCOUNTER — Other Ambulatory Visit: Payer: Self-pay

## 2021-02-19 ENCOUNTER — Encounter: Payer: Self-pay | Admitting: Radiation Oncology

## 2021-02-19 VITALS — BP 133/58 | HR 67 | Temp 96.8°F | Resp 18 | Ht 66.0 in | Wt 162.0 lb

## 2021-02-19 DIAGNOSIS — F419 Anxiety disorder, unspecified: Secondary | ICD-10-CM | POA: Diagnosis not present

## 2021-02-19 DIAGNOSIS — C060 Malignant neoplasm of cheek mucosa: Secondary | ICD-10-CM

## 2021-02-19 DIAGNOSIS — C031 Malignant neoplasm of lower gum: Secondary | ICD-10-CM

## 2021-02-19 DIAGNOSIS — E039 Hypothyroidism, unspecified: Secondary | ICD-10-CM | POA: Insufficient documentation

## 2021-02-19 DIAGNOSIS — I2510 Atherosclerotic heart disease of native coronary artery without angina pectoris: Secondary | ICD-10-CM | POA: Diagnosis not present

## 2021-02-19 DIAGNOSIS — Z87891 Personal history of nicotine dependence: Secondary | ICD-10-CM | POA: Diagnosis not present

## 2021-02-19 DIAGNOSIS — I10 Essential (primary) hypertension: Secondary | ICD-10-CM | POA: Diagnosis not present

## 2021-02-19 DIAGNOSIS — E785 Hyperlipidemia, unspecified: Secondary | ICD-10-CM | POA: Diagnosis not present

## 2021-02-19 DIAGNOSIS — C411 Malignant neoplasm of mandible: Secondary | ICD-10-CM | POA: Insufficient documentation

## 2021-02-19 DIAGNOSIS — Z79899 Other long term (current) drug therapy: Secondary | ICD-10-CM | POA: Insufficient documentation

## 2021-02-19 DIAGNOSIS — K219 Gastro-esophageal reflux disease without esophagitis: Secondary | ICD-10-CM | POA: Insufficient documentation

## 2021-02-19 NOTE — Progress Notes (Signed)
Oncology Nurse Navigator Documentation   Met with patient during initial consult with Dr. Isidore Moos. She was accompanied by her husband, [name].  Further introduced myself as her/their Navigator, explained my role as a member of the Care Team. Provided New Patient Information packet: Contact information for physician, this navigator, other members of the Care Team Advance Directive information ([place] blue pamphlet with LCSW insert); provided [facility] AD booklet at his request,  Fall Prevention Patient [place] Information sheet Symptom Management Clinic information [facility] campus map with highlight of [facility] SLP Information sheet Assisted with post-consult appt scheduling. They verbalized understanding of information provided. I encouraged them to call with questions/concerns moving forward.  Harlow Asa, RN, BSN, OCN Head & Neck Oncology Nurse [place] at [place] [phone]

## 2021-02-20 DIAGNOSIS — C031 Malignant neoplasm of lower gum: Secondary | ICD-10-CM | POA: Insufficient documentation

## 2021-02-20 NOTE — Progress Notes (Signed)
Dental Form with Estimates of Radiation Dose     Diagnosis:    ICD-10-CM   1. 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 Carcinoma of lower gum ([place])  C03.1       Prognosis: curable  Anticipated # of fractions: 30    Daily?: yes  # of weeks of radiotherapy: 6  Chemotherapy?: yes, likely  Anticipated xerostomia:  Mild permanent   Pre-simulation needs:  Tongue positioner vs Scatter protection as feasible;  Simulation: Cannot wait for dental extractions unless there is an extraordinary dental issue (please call if that is the case.)  Not sure if devices can be secured in mouth given recent reconstruction. Will sim on 8-22 and start 8-29  Other Notes: as above Please contact Eppie Gibson, MD, with patient's disposition after evaluation and/or dental treatment.

## 2021-02-21 ENCOUNTER — Other Ambulatory Visit: Payer: Self-pay | Admitting: Family Medicine

## 2021-02-21 ENCOUNTER — Other Ambulatory Visit: Payer: Self-pay | Admitting: Physician Assistant

## 2021-02-21 DIAGNOSIS — I10 Essential (primary) hypertension: Secondary | ICD-10-CM

## 2021-02-21 DIAGNOSIS — E782 Mixed hyperlipidemia: Secondary | ICD-10-CM

## 2021-02-22 ENCOUNTER — Other Ambulatory Visit: Payer: Self-pay | Admitting: Physician Assistant

## 2021-02-22 ENCOUNTER — Other Ambulatory Visit: Payer: Self-pay

## 2021-02-22 DIAGNOSIS — I10 Essential (primary) hypertension: Secondary | ICD-10-CM

## 2021-02-22 DIAGNOSIS — C060 Malignant neoplasm of cheek mucosa: Secondary | ICD-10-CM

## 2021-02-22 DIAGNOSIS — D0000 Carcinoma in situ of oral cavity, unspecified site: Secondary | ICD-10-CM | POA: Insufficient documentation

## 2021-02-22 DIAGNOSIS — E039 Hypothyroidism, unspecified: Secondary | ICD-10-CM

## 2021-02-22 NOTE — Progress Notes (Signed)
tsh

## 2021-02-23 ENCOUNTER — Ambulatory Visit (INDEPENDENT_AMBULATORY_CARE_PROVIDER_SITE_OTHER): Payer: Medicare Other | Admitting: Dentistry

## 2021-02-23 ENCOUNTER — Other Ambulatory Visit: Payer: Self-pay

## 2021-02-23 ENCOUNTER — Encounter: Payer: Self-pay | Admitting: Hematology and Oncology

## 2021-02-23 ENCOUNTER — Inpatient Hospital Stay: Payer: Medicare Other

## 2021-02-23 ENCOUNTER — Encounter (HOSPITAL_COMMUNITY): Payer: Self-pay | Admitting: Dentistry

## 2021-02-23 ENCOUNTER — Inpatient Hospital Stay: Payer: Medicare Other | Attending: Hematology and Oncology | Admitting: Hematology and Oncology

## 2021-02-23 VITALS — BP 128/53 | HR 66 | Temp 98.6°F | Resp 18 | Ht 66.0 in | Wt 160.0 lb

## 2021-02-23 DIAGNOSIS — Z5111 Encounter for antineoplastic chemotherapy: Secondary | ICD-10-CM | POA: Insufficient documentation

## 2021-02-23 DIAGNOSIS — R252 Cramp and spasm: Secondary | ICD-10-CM

## 2021-02-23 DIAGNOSIS — K0510 Chronic gingivitis, plaque induced: Secondary | ICD-10-CM

## 2021-02-23 DIAGNOSIS — C060 Malignant neoplasm of cheek mucosa: Secondary | ICD-10-CM

## 2021-02-23 DIAGNOSIS — Z01818 Encounter for other preprocedural examination: Secondary | ICD-10-CM | POA: Diagnosis not present

## 2021-02-23 DIAGNOSIS — C031 Malignant neoplasm of lower gum: Secondary | ICD-10-CM | POA: Diagnosis not present

## 2021-02-23 DIAGNOSIS — E039 Hypothyroidism, unspecified: Secondary | ICD-10-CM | POA: Insufficient documentation

## 2021-02-23 DIAGNOSIS — K0850 Unsatisfactory restoration of tooth, unspecified: Secondary | ICD-10-CM

## 2021-02-23 DIAGNOSIS — K06020 Generalized gingival recession, unspecified: Secondary | ICD-10-CM

## 2021-02-23 DIAGNOSIS — Z87891 Personal history of nicotine dependence: Secondary | ICD-10-CM | POA: Insufficient documentation

## 2021-02-23 DIAGNOSIS — K08109 Complete loss of teeth, unspecified cause, unspecified class: Secondary | ICD-10-CM

## 2021-02-23 NOTE — Progress Notes (Addendum)
Department of Dental Medicine      OUTPATIENT CONSULT  Service Date:   02/23/2021  Patient Name:   Tammy Brady Date of Birth:   Sep 27, 1950 Medical Record Number: [id number]  Referring Provider:                Eppie Gibson, M.D.        TODAY'S VISIT:   Assessment:   There are no current signs of acute odontogenic infection including abscess, edema or erythema, or suspicious lesion requiring biopsy.   Recommendations:   No dental intervention indicated prior to radiation at this time.  Plan:   Discuss case with medical team and coordinate treatment as needed. Follow-up after completion of radiation therapy.  Discussed in detail all treatment options and recommendations with the patient and they are agreeable to the plan.    Thank you for consulting with [facility] and for the opportunity to participate in this patient's treatment.  Should you have any questions or concerns, please contact the [facility] at [phone].        PROGRESS NOTE:   COVID-19 SCREENING:  The patient denies symptoms concerning for COVID-19 infection including fever, chills, cough, or newly developed shortness of breath.   HISTORY OF PRESENT ILLNESS: Tammy Brady is a very pleasant 70 y.o. female with h/o HTN, hyperlipidemia, coronary artery disease, GERD, hypothyroidism, h/o tobacco use, depression, anxiety and gastritis who was recently diagnosed with invasive SCC of the left mandible and is anticipating chemoradiation therapy.  She is s/p mandibulectomy, bilateral neck dissection and reconstruction which was done at [facility] on 01/18/21.  The patient presents today for a medically necessary dental consultation as part of their pre-radiation therapy work-up.   DENTAL HISTORY: The patient reports she last visited the dentist in May of this year because she noticed her gums were swollen and she had some loose teeth.  She currently denies any dental/orofacial pain or sensitivity.  She  does report having a crown or a cap fall off one of her upper right molars that was completed by her previous dentist a few months ago, but it has not bothered her. Patient is able to manage oral secretions.  Patient denies dysphagia, odynophagia, dysphonia, SOB and neck pain.  Patient denies fever, rigors and malaise.   CHIEF COMPLAINT:  Here for a pre-head and neck radiation dental exam.   Patient Active Problem List   Diagnosis Date Noted  . Carcinoma of lower gum ([place]) 02/20/2021  . 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[place]) 12/08/2020  . Gastritis 12/08/2020  . Anxiety 12/08/2020  . Need for prophylactic vaccination and inoculation against influenza 07/30/2020  . Depression 11/26/2015  . GERD (gastroesophageal reflux disease) 11/26/2015  . Migraine headache 11/26/2015  . Screening for diabetes mellitus 09/03/2015  . Hypothyroidism 07/22/2009  . Mixed hyperlipidemia 07/22/2009  . Tobacco user 07/22/2009  . Hypertension, essential 07/22/2009  . CAD, NATIVE VESSEL 07/22/2009  . 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  . Coronary artery spasm ([place]) 07/22/2009   Past Medical History:  Diagnosis Date  . CAD (coronary artery disease)    Mild non-obstructive disease by cath January 2011  . Carotid artery disease ([place])    Followed by Dr Kellie Simmering  . GERD (gastroesophageal reflux disease)   . Hyperlipidemia   . Hypertension   . Hypothyroidism    Past Surgical History:  Procedure Laterality Date  . APPENDECTOMY     Allergies  Allergen Reactions  . Codeine Other (See Comments)  Headache  HEADACHE   Current Outpatient Medications  Medication Sig Dispense Refill  . acetaminophen (TYLENOL) 500 MG tablet Take 500 mg by mouth every 6 (six) hours as needed.    Marland Kitchen amoxicillin-clavulanate (AUGMENTIN) 875-125 MG tablet Take 1 tablet by mouth 2 (two) times daily.    Marland Kitchen aspirin 325 MG tablet Take 650 mg by mouth as needed for mild pain or moderate pain.    . calcium carbonate  (OS-CAL) 600 MG TABS Take 600 mg by mouth daily.    Marland Kitchen diltiazem (CARDIZEM CD) 360 MG 24 hr capsule TAKE 1 CAPSULE(360 MG) BY MOUTH DAILY 90 capsule 0  . gabapentin (NEURONTIN) 300 MG capsule Take 300 mg by mouth 3 (three) times daily.    Marland Kitchen levothyroxine (SYNTHROID) 112 MCG tablet TAKE 1 TABLET BY MOUTH DAILY 90 tablet 0  . Multiple Vitamin (MULTIVITAMIN) tablet Take 1 tablet by mouth daily.    . nitroGLYCERIN (NITROSTAT) 0.4 MG SL tablet Place 1 tablet (0.4 mg total) under the tongue every 5 (five) minutes as needed. 25 tablet 6  . Nutritional Supplements (FEEDING SUPPLEMENT, OSMOLITE 1.5 CAL,) LIQD Place into feeding tube daily. 4-5 cartons daily    . oxyCODONE (ROXICODONE) 5 MG/5ML solution Take 5 mg by mouth every 6 (six) hours as needed. (Patient not taking: Reported on 02/23/2021)    . PERIOGARD 0.12 % solution 30 mLs by Mouth Rinse route daily.    . simvastatin (ZOCOR) 40 MG tablet TAKE 1 TABLET(40 MG) BY MOUTH AT BEDTIME 90 tablet 0  . traZODone (DESYREL) 50 MG tablet Take 1 tablet (50 mg total) by mouth at bedtime. 90 tablet 1   No current facility-administered medications for this visit.    LABS: Lab Results  Component Value Date   WBC 9.8 12/08/2020   HGB 12.2 12/08/2020   HCT 36.7 12/08/2020   MCV 93 12/08/2020   PLT 289 12/08/2020      Component Value Date/Time   NA 142 12/08/2020 1356   K 4.4 12/08/2020 1356   CL 103 12/08/2020 1356   CO2 22 12/08/2020 1356   GLUCOSE 89 12/08/2020 1356   GLUCOSE 87 07/17/2009 0510   BUN 15 12/08/2020 1356   CREATININE 0.71 12/08/2020 1356   CALCIUM 8.7 12/08/2020 1356   GFRNONAA 72 08/26/2020 0927   GFRAA 83 08/26/2020 0927   Lab Results  Component Value Date   INR 0.99 07/16/2009   No results found for: PTT  Social History   Socioeconomic History  . Marital status: Married    Spouse name: Not on file  . Number of children: Not on file  . Years of education: Not on file  . Highest education level: Not on file   Occupational History  . Occupation: Field seismologist: [employer]  Tobacco Use  . Smoking status: Former    Packs/day: 1.00    Years: 40.00    Pack years: 40.00    Types: Cigarettes    Quit date: 05/11/2009    Years since quitting: 11.7  . Smokeless tobacco: Never  Substance and Sexual Activity  . Alcohol use: Not Currently  . Drug use: No  . Sexual activity: Not Currently  Other Topics Concern  . Not on file  Social History Narrative  . Not on file   Social Determinants of Health   Financial Resource Strain: Not on file  Food Insecurity: Not on file  Transportation Needs: Not on file  Physical Activity: Not on file  Stress: Not  on file  Social Connections: Not on file  Intimate Partner Violence: Not on file   Family History  Problem Relation Age of Onset  . Cancer Mother   . Coronary artery disease Father   . Coronary artery disease Maternal Grandmother      REVIEW OF SYSTEMS:  Reviewed with the patient as per HPI. Psych: Patient denies having dental phobia.   VITAL SIGNS: BP (!) 137/26 (BP Location: Right Arm, Patient Position: Sitting, Cuff Size: Normal)   Pulse 67    PHYSICAL EXAM: General:  Well-developed, comfortable and in no apparent distress. Neurological:  Alert and oriented to person, place and  time. Extraoral:  Facial symmetry present without any edema or erythema.  No swelling or lymphadenopathy.  TMJ asymptomatic without clicks or crepitations. (+) Trismus Maximum Interincisal Opening:  28 mm (measured from right side premolar to premolar) Intraoral:  Soft tissues appear well-perfused and mucous membranes moist.  FOM and vestibules soft and not raised. No signs of infection, parulis, sinus tract, edema or erythema evident upon exam. (+) Oropharynx notable s/p surgical reconstruction and partial mandibulectomy.  (+) Anterior oral cavity notable for flap which appears to be healing well with no signs of infection, erythema or  edema.   DENTAL EXAM: Hard tissue exam completed and charted. Overall impression:  Good remaining dentition.  Oral hygiene:  Fair    Periodontal:  Pink, healthy gingival tissue with blunted papilla.  Localized plaque accumulation.  Mild generalized gingival recession. Caries:  No clinical caries noted. Defective restorations:  #2 missing DO composite filling, no signs of recurrent decay. Occlusion:  Unable to assess molar occlusion.  Non-functional teeth numbers 11, 12, 13, 14 and 15.   RADIOGRAPHIC EXAM:  PAN exposed and interpreted.  Unable to take full mouth series d/t recent reconstruction.  Condyles seated bilaterally in fossas.  No evidence of abnormal pathology.  All visualized osseous structures appear WNL. Bilateral radiopacities consistent with earrings. Radiopaque region spanning from left posterior mandible across midline to mental area of right mandible consistent with recent surgery and reconstruction; small radiopaque regions on left and right side below metal screws also consistent with recent surgery. Missing all lower teeth except for #29, #30 and #31.  Existing restorations.  Missing all 3rd molars.   ASSESSMENT:  1.  Invasive SCC of left mandible 2.  Pre-radiotherapy dental exam 3.  Missing teeth 4.  Gingival recession, generalized 5.  Plaque-induced gingivitis 6.  Defective dental restoration 7.  Trismus   PROCEDURES: The common and significant side effects of radiation therapy to the head and neck were explained and discussed with the patient.  The discussion included side effects of trismus (limited opening), dysgeusia (loss of taste), xerostomia (dry mouth), radiation caries and osteoradionecrosis of the jaw.  I also discussed the importance of maintaining optimal oral hygiene and oral health before, during and after radiation to decrease the risk of developing radiation cavities and the need for any surgery such as extractions after therapy.    Upper  alginate impressions taken and poured up in Type IV Microstone for fabrication of fluoride tray to give to patient after radiation. Trismus appliance made using patient's baseline MIO (15 sticks).  Leta Speller, DAII demonstrated use of appliance.  Verbal and written postop instructions were given to the patient.   PLAN AND RECOMMENDATIONS: I discussed the risks, benefits, and complications of various scenarios with the patient in relationship to their medical and dental conditions, which included systemic infection or other serious issues such  as osteoradionecrosis that could potentially occur either before, during or after their anticipated radiation therapy if dental/oral concerns are not addressed.  I explained that if any chronic or acute dental/oral infection(s) are addressed and subsequently not maintained following medical optimization and recovery, their risk of the previously mentioned complications are just as high and could potentially occur postoperatively.  I explained all significant findings of the dental consultation with the patient including her limited opening and difficulty reaching all of her teeth to brush/floss adequately and her high caries risk s/p radiation therapy (risk for radiation caries due to dry mouth and limited opening and difficulty brushing/flossing), and the recommended care including establishing care with an outside dental provider to see every 4 months for exams, xrays and cleanings in order to optimize them following radiotherapy from a dental standpoint.  The patient verbalized understanding of all findings, discussion, and recommendations. We then discussed various treatment options to include no treatment, multiple extractions with alveoloplasty, pre-prosthetic surgery as indicated, periodontal therapy, dental restorations, root canal therapy, crown and bridge therapy, implant therapy, and replacement of missing teeth as indicated.  The patient verbalized  understanding of all options, and currently wishes to proceed with scheduling a follow-up visit in our clinic after radiation to receive fluoride trays and discuss her side most concerning side effects, and then finding a primary dentist for routine care. Plan to discuss all findings and recommendations with medical team and coordinate future care as needed.   All questions and concerns were invited and addressed.  The patient tolerated today's visit well and departed in stable condition.  I spent in excess of 120 minutes during the conduct of this consultation and >50% of this time involved direct face-to-face encounter for counseling and/or coordination of the patient's care.  [place], D.M.D.

## 2021-02-23 NOTE — Patient Instructions (Signed)
[facility] Department of Dental Medicine Tammy Brady Tammy Brady, D.M.D. Phone: [phone] Fax: [phone]   It was a pleasure seeing you today!  Please refer to the information below regarding your dental visit with us.  Call if you have any questions or concerns that come up after you leave.   Thank you for letting us provide care for you.  If there is anything we can do for you, please let us know.    RADIATION THERAPY AND INFORMATION REGARDING YOUR TEETH   XEROSTOMIA (DRY MOUTH):  Your salivary glands may be in the field of radiation.  Radiation may include all or only part of your salivary glands.  This will cause your saliva to dry up, and you will have a dry mouth.  The dry mouth will be for the rest of your life unless your radiation oncologist tells you otherwise.  Your saliva has many functions: It wets your tongue for speaking. It coats your teeth and the inside of your mouth for easier movement. It helps with chewing and swallowing food. It helps clean away harmful acid and toxic products made by the germs in your mouth, therefore it helps prevent cavities. It kills some germs in your mouth and helps to prevent gum disease. It helps to carry flavor to your taste buds.  Once you have lost your saliva, you will be at higher risk for tooth decay and gum disease.    What can be done to help improve your mouth when there's not enough saliva? Your dentist may give a recommendation for CLoSYS.  It will not bring back all of your saliva but may bring back some of it.  Also, your saliva may be thick and ropy or white and foamy.  It will not feel like it use to feel. You will need to swish with water every time your mouth feels dry.  YOU CANNOT suck on any cough drops, mints, lemon drops, candy, vitamin C or any other products.  You cannot use anything other than water to make your mouth feel less dry.  If you want to drink anything else, you have to drink it all at once and brush  afterwards.  Be sure to discuss the details of your diet habits with your dentist or hygienist.   RADIATION CARIES:  This is decay (cavities) that happens very quickly once your mouth is very dry due to radiation therapy.  Normally, cavities take six months to two years to become a problem.  When you have dry mouth, cavities may take as little as eight weeks to cause you a problem.    Dental check-ups every two months are necessary as long as you have a dry mouth. Radiation caries typically, but not always, start at your gum line where it is hard to see the cavity.  It is therefore also hard to fill these cavities adequately.  This high rate of cavities happens because your mouth no longer has saliva and therefore the acid made by the germs starts the decay process.  Whenever you eat anything the germs in your mouth change the food into acid.  The acid then burns a small hole in your tooth.  This small hole is the beginning of a cavity.  If this is not treated then it will grow bigger and become a cavity.  The way to avoid this hole getting bigger is to use fluoride every evening as prescribed by your dentist following your radiation. NOTE:  You have to make sure   that your teeth are very clean before you use the fluoride.  This fluoride in turn will strengthen your teeth and prepare them for another day of fighting acid. If you develop radiation caries many times, the damage is so large that you will have to have all your teeth removed.  This could be a big problem if some of these teeth are in the field of radiation.  Further details of why this could be a big problem will follow (see Osteoradionecrosis below).   DYSGEUSIA (LOSS OF TASTE):  This happens to varying degrees once you've had radiation therapy to your jaw region.  Many times taste is not completely lost, but becomes limited.  The loss of taste is mostly due to radiation affecting your taste buds.  However, if you have no saliva in your mouth  to carry the flavor to your taste buds, it would be difficult for your taste buds to taste anything.  That is why using water or a prescription for Salagen prior to meals and during meal times may help with some of the taste.  Keep in mind that taste generally returns very slowly over the course of several months or several years after radiation therapy.  Don't give up hope.   TRISMUS (LIMITED JAW OPENING):  According to your radiation oncologist, your TMJ or jaw joints are going to be partially or fully in the field of radiation.  This means that over time the muscles that help you open and close your mouth may get stiff.  This will potentially result in your not being able to open your mouth wide enough or as wide as you can open it now.    Let me give you an example of how slowly this happens and how unaware people are of it:   A gentlemen that had radiation therapy two years ago came back to me complaining that bananas are just too large for him to be able to fit them in between his teeth.  He was not able to open wide enough to bite into a banana.  This happens slowly and over a period of time.  What we do to try and prevent this:   Your dentist will probably give you a stack of sticks called a trismus exercise device.  This stack will help remind your muscles and your jaw joints to open up to the same distance every day.  Use these sticks every morning when you wake up, or according to the instructions given by your dentist.    You must use these sticks for at least one to two years after radiation therapy.  The reason for that is because it happens so slowly and keeps going on for about two years after radiation therapy.  Your hospital dentist will help you monitor your mouth opening and make sure that it's not getting smaller after radiation.  TRISMUS EXERCISES: Using the stack of sticks given to you by your dentist, place the stack in your mouth and hold onto the other end for support. Leave  the sticks in your mouth while holding the other end.  Allow 30 seconds for muscle stretching. Rest for a few seconds. Repeat 3-5 times. This exercise is recommended in the mornings and evenings unless otherwise instructed. The exercise should be done for a period of 2 YEARS after the end of radiation. Your maximum jaw opening should be checked regularly at recall dental visits by your general dentist. You should report any changes, soreness, or difficulties encountered   when doing the exercises to your dentist.   OSTEORADIONECROSIS (ORN):  This is a condition where your jaw bone after radiation therapy becomes very dry.  It has very little blood supply to keep it alive.  If you develop a cavity that turns into an abscess or an infection, then the jaw bone does not have enough blood supply to help fight the infection.  At this point it is very likely that the infection could cause the death of your jaw bone.  When you have dead bone it has to be removed.  Therefore, you might end up having to have surgery to remove part of your jaw bone, the part of the jaw bone that has been affected.     Healing is also a problem if you are to have surgery (like a tooth extraction) in the areas where the bone has had radiation therapy.  If you have surgery, you need more blood supply to heal which is not available.  When blood supply and oxygen are not available, there is a chance for the bone to die. Occasionally, ORN happens on its own with no obvious reason, but this is quite rare.  We believe that patients who continue to smoke and/or drink alcohol have a higher chance of having this problem. Once your jaw bone has had radiation therapy, if there are any remaining teeth in that area, it is not recommended to have them pulled unless your dentist or oral surgeon is aware of your history of radiation and believes it is safe.  The risks for ORN either from infection or spontaneously occurring (with no reason) are life  long.   QUESTIONS? Call our office during office hours at [phone].  

## 2021-02-23 NOTE — Progress Notes (Signed)
[place] NOTE  Patient Care Team: Marge Duncans, Hershal Coria as PCP - General (Physician Assistant)  CHIEF COMPLAINTS/PURPOSE OF CONSULTATION:  SCC Oral mucosa  ASSESSMENT & PLAN:   Orders Placed This Encounter  Procedures   IR IMAGING GUIDED PORT INSERTION    Standing Status:   Future    Standing Expiration Date:   02/23/2022    Order Specific Question:   Reason for Exam (SYMPTOM  OR DIAGNOSIS REQUIRED)    Answer:   chemotherapy administration    Order Specific Question:   Preferred Imaging Location?    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  Her pathologic staging is pT3 N3B. We have discussed that in patients with positive margins and presence of extracapsular extension, we do recommend chemotherapy along with radiation is standard adjuvant option.  Since she is healthy for her age and of excellent performance status at baseline, I agree this is a reasonable option to consider.  We have discussed about cisplatin, mechanism of action, adverse effects from cisplatin including but not limited to fatigue, nausea, vomiting, ototoxicity, nephrotoxicity, neuritis.  She understands that some of the side effects can be permanent. She would want to proceed with a port for chemotherapy administration, labs and fluids, order has been placed.  She already has an existing G-tube which is her source of nourishment.  We will continue to monitor this. Anticipated start date for radiation is August 29, we will try to coordinate chemotherapy for about the same date. Her labs from last month  show adequate creatinine however we will repeat labs today before her chemotherapy administration. All her questions were answered to the best of my knowledge.  She will return to clinic in 2 weeks for follow-up.  Thank you for consulting [place] in the care of this patient.  Please not hesitate to contact us with any additional questions or concerns.  HISTORY OF PRESENTING ILLNESS:  KALIYA REDDIN 70 y.o. female is here because of  SCC oral mucosa  This is a pleasant 70 year old female patient who first presented to her dentist with a painful lesion to her gums noticed more like at the end of 2021.  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  Subsequently she was referred to Dr. Amada Jupiter on December 14, 2020 who discussed surgical intervention with patient.  She had neck CT which showed an enhancing soft tissue lesion along the buccal margin of left mandible with a lucency in the adjacent bone consistent with known SCC.  She was also found to have necrotic left level 1A/PN2/3B lymph nodes noted to be concerning for nodal metastasis.  She underwent resection on January 18, 2021 which revealed 3.3 cm tumor, grade 2 squamous cell carcinoma of the alveolar ridge extending to buccal gingival junction and lingual sulcus, depth of invasion 11 mm, negative margins, 6 out of 49 lymph nodes positive bilaterally with extracapsular extension, no perineural invasion or lymphovascular invasion.  She is now referred to medical oncology for consideration of adjuvant chemoradiation.  PATHOLOGY REPORT: Diagnosis     A:  Mouth, RMT mucosal margin, excision - Benign squamous mucosa and submucosa with no tumor seen.   B: Mouth, left buccal margin, excision - Benign squamous mucosa and submucosa with no tumor seen.   C: Mouth, labial mucosal margin, excision - Benign squamous mucosa and submucosa with no tumor seen.   D: Mouth, anterior gingival mucosal margin, excision - Benign  squamous mucosa and submucosa with no tumor seen.   E: Mouth, floor of mouth, biopsy - Benign squamous mucosa and submucosa with no tumor seen.   F: Tongue, ventral tongue, biopsy - Benign squamous mucosa and submucosa with no tumor seen.   G: Bone marrow, anterior mandible, biopsy - Fragment of blood clot, no tumor seen, on original frozen section only, tissue has been cut through on permanent sections.   H: Bone marrow, posterior mandible, biopsy - Fragment of blood clot, no tumor seen, on original frozen section only, tissue has been cut through on permanent sections.   I: Lymph node, left neck level 1B and level 1A, lymphadenectomy - Metastatic squamous cell carcinoma with cystic change, involving 2 of 4 lymph nodes (2/4), size of largest metastasis 2.2 cm. - Benign submandibular gland is also present.   J: Lymph node, right level 1B, lymphadenectomy - Metastatic squamous cell carcinoma involving 2 of 4 lymph nodes (2/4), with cystic change, size of largest metastasis 2.0 cm in greatest dimension, with extranodal extension of tumor identified. - Submandibular gland present with no tumor seen.   K: Lymph node, left EJ, excision - No tumor seen in three lymph nodes (0/3).   L: Lymph node, left neck, level 2, lymphadenectomy - No tumor seen in 14 lymph nodes (0/14).   M: Lymph node, left neck, level 3, lymphadenectomy - Metastatic squamous cell carcinoma involving 1 of 7 lymph nodes (1/7), size of metastasis 2.2 cm, with extranodal extension of tumor identified.   N: Lymph node, left neck, level 4, lymphadenectomy - Metastatic squamous cell carcinoma involving 1 of 2 lymph nodes (1/2), size of metastasis 1 mm.   O: Lymph node, right neck, level 2, lymphadenectomy - No tumor seen in eight lymph nodes (0/8).   P: Lymph node, right neck, level 3, lymphadenectomy - No tumor seen in four lymph nodes (0/4).   Q: Lymph node, right neck, level 4, lymphadenectomy - No tumor seen in  three lymph nodes (0/3).   R: Left composite mandibular resection (segmental mandibulectomy and resection of submental gland) - Squamous cell carcinoma, moderately differentiated, of alveolar ridge extending to buccal gingival junction and lingual sulcus. - Tumor size 3.3 cm in greatest lateral dimension, depth of invasion 1.1 cm. - Tumor involves the alveolar bone and does not exceed the depth of the roots of the teeth, and does not extend into the body of the mandible.  - Surgical margins free of tumor (soft tissue and bone margins all negative for tumor) - Submental gland present with no tumor seen.   S: Teeth, extraction - Multiple intact and fragmented teeth (gross examination only).     She was seen by my colleague Dr. Isidore Moos and radiation oncology and she was recommended to consider postop radiotherapy.  She is here with her husband today for her initial appointment.  She is recovering as expected from graft [place] and from the wound on her leg.  She is an ex-smoker, on and off smoked for about 40 pack years.  Recent quit date is May 2022.  She does have some baseline hearing issues but does not  want to proceed with an audiology appointment at this time.  No underlying kidney issues.  She was informed that chemoradiation would be recommended for adjuvant treatment and she is willing to proceed.  No concerning review of systems for me today.   REVIEW OF SYSTEMS:   Constitutional: Denies fevers, chills or abnormal night sweats Eyes: Denies blurriness of vision, double vision or watery eyes Ears, nose, mouth, throat, and face: Denies mucositis or sore throat Respiratory: Denies cough, dyspnea or wheezes Cardiovascular: Denies palpitation, chest discomfort or lower extremity swelling Gastrointestinal:  Denies nausea, heartburn or change in bowel habits Skin: Denies abnormal skin rashes Lymphatics: Denies new lymphadenopathy or easy bruising Neurological:Denies numbness, tingling or  new weaknesses Behavioral/Psych: Mood is stable, no new changes  All other systems were reviewed with the patient and are negative.  MEDICAL HISTORY:  Past Medical History:  Diagnosis Date   CAD (coronary artery disease)    Mild non-obstructive disease by cath January 2011   Carotid artery disease (HCC)    Followed by Dr Kellie Simmering   GERD (gastroesophageal reflux disease)    Hyperlipidemia    Hypertension    Hypothyroidism     SURGICAL HISTORY: Past Surgical History:  Procedure Laterality Date   APPENDECTOMY      SOCIAL HISTORY: Social History   Socioeconomic History   Marital status: Married    Spouse name: Not on file   Number of children: Not on file   Years of education: Not on file   Highest education level: Not on file  Occupational History   Occupation: Field seismologist: [employer]  Tobacco Use   Smoking status: Former    Packs/day: 1.00    Years: 40.00    Pack years: 40.00    Types: Cigarettes    Quit date: 05/11/2009    Years since quitting: 11.7   Smokeless tobacco: Never  Substance and Sexual Activity   Alcohol use: Not Currently   Drug use: No   Sexual activity: Not Currently  Other Topics Concern   Not on file  Social History Narrative   Not on file   Social Determinants of Health   Financial Resource Strain: Not on file  Food Insecurity: Not on file  Transportation Needs: Not on file  Physical Activity: Not on file  Stress: Not on file  Social Connections: Not on file  Intimate Partner Violence: Not on file    FAMILY HISTORY: Family History  Problem Relation Age of Onset   Cancer Mother    Coronary artery disease Father    Coronary artery disease Maternal Grandmother     ALLERGIES:  is allergic to codeine.  MEDICATIONS:  Current Outpatient Medications  Medication Sig Dispense Refill   acetaminophen (TYLENOL) 500 MG tablet Take 500 mg by mouth every 6 (six) hours as needed.     amoxicillin-clavulanate (AUGMENTIN) 875-125  MG tablet Take 1 tablet by mouth 2 (two) times daily.     aspirin 325 MG tablet Take 650 mg by mouth as needed for mild pain or moderate pain.     calcium carbonate (OS-CAL) 600 MG TABS Take 600 mg by mouth daily.     diltiazem (CARDIZEM CD) 360 MG 24 hr capsule TAKE 1 CAPSULE(360 MG) BY MOUTH DAILY 90 capsule 0   gabapentin (NEURONTIN) 300 MG capsule Take 300 mg by mouth 3 (three) times daily.     levothyroxine (SYNTHROID) 112 MCG tablet TAKE 1 TABLET BY MOUTH DAILY 90 tablet 0   Multiple  Vitamin (MULTIVITAMIN) tablet Take 1 tablet by mouth daily.     nitroGLYCERIN (NITROSTAT) 0.4 MG SL tablet Place 1 tablet (0.4 mg total) under the tongue every 5 (five) minutes as needed. 25 tablet 6   Nutritional Supplements (FEEDING SUPPLEMENT, OSMOLITE 1.5 CAL,) LIQD Place into feeding tube daily. 4-5 cartons daily     PERIOGARD 0.12 % solution 30 mLs by Mouth Rinse route daily.     simvastatin (ZOCOR) 40 MG tablet TAKE 1 TABLET(40 MG) BY MOUTH AT BEDTIME 90 tablet 0   traZODone (DESYREL) 50 MG tablet Take 1 tablet (50 mg total) by mouth at bedtime. 90 tablet 1   oxyCODONE (ROXICODONE) 5 MG/5ML solution Take 5 mg by mouth every 6 (six) hours as needed. (Patient not taking: Reported on 02/23/2021)     No current facility-administered medications for this visit.    PHYSICAL EXAMINATION:  ECOG PERFORMANCE STATUS: 0 - Asymptomatic  Vitals:   02/23/21 1122  BP: (!) 128/53  Pulse: 66  Resp: 18  Temp: 98.6 F (37 C)  SpO2: 98%   Filed Weights   02/23/21 1122  Weight: 160 lb (72.6 kg)    GENERAL:alert, no distress and comfortable SKIN: skin color, texture, turgor are normal, no rashes or significant lesions EYES: normal, conjunctiva are pink and non-injected, sclera clear OROPHARYNX: Graft site noted in the oral cavity appears healthy at this time. NECK: supple, thyroid normal size, non-tender, without nodularity LYMPH:  no palpable lymphadenopathy in the cervical, axillary LUNGS: clear to  auscultation and percussion with normal breathing effort HEART: regular rate & rhythm and no murmurs and no lower extremity edema ABDOMEN:abdomen soft, non-tender and normal bowel sounds Musculoskeletal:no cyanosis of digits and no clubbing.  Skin site on the left lower extremity appears well with pink granulation tissue.  No concerning evidence of infection. PSYCH: alert & oriented x 3 with fluent speech NEURO: no focal motor/sensory deficits  LABORATORY DATA:  I have reviewed the data as listed Lab Results  Component Value Date   WBC 9.8 12/08/2020   HGB 12.2 12/08/2020   HCT 36.7 12/08/2020   MCV 93 12/08/2020   PLT 289 12/08/2020     Chemistry      Component Value Date/Time   NA 142 12/08/2020 1356   K 4.4 12/08/2020 1356   CL 103 12/08/2020 1356   CO2 22 12/08/2020 1356   BUN 15 12/08/2020 1356   CREATININE 0.71 12/08/2020 1356      Component Value Date/Time   CALCIUM 8.7 12/08/2020 1356   ALKPHOS 84 12/08/2020 1356   AST 39 12/08/2020 1356   ALT 24 12/08/2020 1356   BILITOT <0.2 12/08/2020 1356     Have reviewed pertinent pathology report.  RADIOGRAPHIC STUDIES: I have personally reviewed the radiological images as listed and agreed with the findings in the report. No results found.  All questions were answered. The patient knows to call the clinic with any problems, questions or concerns. I spent 60 minutes in the care of this patient including H and P, review of records, counseling and coordination of care.     Benay Pike, MD 02/23/2021 12:29 PM

## 2021-02-23 NOTE — Progress Notes (Signed)
Oncology Nurse Navigator Documentation   Met with patient during initial consult with Dr. Chryl Heck. She was accompanied by her husband Tammy Brady.  Further introduced myself as her/their Navigator, explained my role as a member of the Care Team. Assisted with post-consult appt scheduling. I got her PAC scheduled for 03/02/21 at 10:00. Tammy Brady was made aware at her dental consultation with Dr. Benson Norway. They verbalized understanding of information provided. I encouraged them to call with questions/concerns moving forward.  Tammy Asa, RN, BSN, OCN Head & Neck Oncology Nurse [place] at [place] [phone]

## 2021-02-25 ENCOUNTER — Encounter: Payer: Self-pay | Admitting: General Practice

## 2021-02-25 ENCOUNTER — Other Ambulatory Visit: Payer: Self-pay | Admitting: Hematology and Oncology

## 2021-02-25 DIAGNOSIS — C060 Malignant neoplasm of cheek mucosa: Secondary | ICD-10-CM

## 2021-02-25 MED ORDER — LIDOCAINE-PRILOCAINE 2.5-2.5 % EX CREA: TOPICAL_CREAM | CUTANEOUS | 3 refills | Status: DC

## 2021-02-25 MED ORDER — DEXAMETHASONE 4 MG PO TABS: 8.0000 mg | ORAL_TABLET | Freq: Every day | ORAL | 1 refills | Status: DC

## 2021-02-25 MED ORDER — ONDANSETRON HCL 8 MG PO TABS: 8.0000 mg | ORAL_TABLET | Freq: Two times a day (BID) | ORAL | 1 refills | Status: DC | PRN

## 2021-02-25 MED ORDER — PROCHLORPERAZINE MALEATE 10 MG PO TABS: 10.0000 mg | ORAL_TABLET | Freq: Four times a day (QID) | ORAL | 1 refills | Status: DC | PRN

## 2021-02-25 NOTE — Progress Notes (Signed)
START ON PATHWAY REGIMEN - Head and Neck     A cycle is every 7 days:     Cisplatin   **Always confirm dose/schedule in your pharmacy ordering system**  Patient Characteristics: Oral Cavity, Postoperative without Neoadjuvant Therapy, High Risk Disease Classification: Oral Cavity AJCC T Category: T3 AJCC M Category: M0 AJCC N Category: pN3b AJCC 8 Stage Grouping: IVB Therapeutic Status: Postoperative without Neoadjuvant Therapy Risk Status: High Risk Intent of Therapy: Curative Intent, Discussed with Patient 

## 2021-02-25 NOTE — Progress Notes (Signed)
[place] Work  Initial Assessment   Tammy Brady is a 70 y.o. year old female contacted by phone. Clinical Social Work was referred by medical and radiation oncologist for assessment of psychosocial needs.   SDOH (Social Determinants of Health) assessments performed: Yes SDOH Interventions    Flowsheet Row Most Recent Value  SDOH Interventions   Food Insecurity Interventions Intervention Not Indicated  Financial Strain Interventions Intervention Not Indicated  Housing Interventions Intervention Not Indicated  Transportation Interventions Intervention Not Indicated       Distress Screen completed: Yes ONCBCN DISTRESS SCREENING 02/19/2021  Screening Type Initial Screening  Distress experienced in past week (1-10) 0  Physician notified of physical symptoms Yes  Referral to clinical psychology No  Referral to clinical social work Yes  Referral to dietition Yes  Referral to financial advocate No  Referral to support programs Yes  Referral to palliative care No      Family/Social Information:  Housing Arrangement: patient lives with husband Family members/support persons in your life? Family, husband accompanies her to appointments, has friends that keep in touch and come by. Has family members who have dealt w serious health challenges with resilience and courage - she draws on their examples.   Transportation concerns: no, patient, husband and daughter can drive  Employment: Retired. Income source: Paediatric nurse concerns: No Type of concern: None Food access concerns: no Religious or spiritual practice: yes Medication Concerns: no  Services Currently in place:  none  Coping/ Adjustment to diagnosis: Patient understands treatment plan and what happens next? yes, newly diagnosed with mouth cancer - initially diagnosed at dentist, patient thought it was periodontal disease.  Was working earlier this year, noticed soreness in gums, went to  dentist in May.  Now recommended to have radiation and chemotherapy.   Concerns about diagnosis and/or treatment: I'm not especially worried about anything and "not worried in the short term", feels husband is "taking it harder than I am."  "I will do what I have to do.Marland Kitchen.."  wonders about the future - impact on teeth, impact on chewing and swallowing.   Surgical oncologist in [facility] is highly involved.   Patient reported stressors: Adjusting to my illness and Physical issues Hopes and priorities: would like to talk w someone else in similar situation.  Recently retired, had hoped to travel and [place] home [place] a [place] area, wanted to spend time seeing family who live in different states Patient enjoys time with family/ friends and travel Current coping skills/ strengths: Ability for insight, Active sense of humor, Average or above average intelligence, Capable of independent living, Armed forces logistics/support/administrative officer, Scientist, research (life sciences), Motivation for treatment/growth, and Supportive family/friends    SUMMARY: Current SDOH Barriers:  none  Interventions: Discussed common feeling and emotions when being diagnosed with cancer, and the importance of support during treatment Informed patient of the support team roles and support services at [facility] Provided CSW contact information and encouraged patient to call with any questions or concerns Referred to organization Support for People with Oral and Head and Neck Cancer    Follow Up Plan: Patient will contact CSW with any support or resource needs Patient verbalizes understanding of plan: Yes    Beverely Pace , [place], [place] Worker Phone:  [phone]

## 2021-02-26 ENCOUNTER — Telehealth: Payer: Self-pay | Admitting: Hematology and Oncology

## 2021-02-26 NOTE — Telephone Encounter (Signed)
Scheduled per 08/17 los, patient has been called and voicemail was left regarding upcoming appointments. Left a voicemail.

## 2021-03-01 ENCOUNTER — Other Ambulatory Visit: Payer: Self-pay

## 2021-03-01 ENCOUNTER — Ambulatory Visit
Admission: RE | Admit: 2021-03-01 | Discharge: 2021-03-01 | Disposition: A | Discharge status: Home / Self Care | Payer: Medicare Other | Source: Ambulatory Visit | Attending: Radiation Oncology | Admitting: Radiation Oncology

## 2021-03-01 ENCOUNTER — Inpatient Hospital Stay: Payer: Medicare Other

## 2021-03-01 ENCOUNTER — Other Ambulatory Visit: Payer: Self-pay | Admitting: Radiology

## 2021-03-01 DIAGNOSIS — Z510 Encounter for antineoplastic radiation therapy: Secondary | ICD-10-CM | POA: Insufficient documentation

## 2021-03-01 DIAGNOSIS — C411 Malignant neoplasm of mandible: Secondary | ICD-10-CM | POA: Insufficient documentation

## 2021-03-01 NOTE — Progress Notes (Addendum)
Oncology Nurse Navigator Documentation   To provide support, encouragement and care continuity, met with Tammy Brady before her CT SIM. She was accompanied by her husband, [name].  She tolerated procedure without difficulty, denied questions/concerns.   I encouraged them to call me prior to 03/08/21 [place].   Harlow Asa RN, BSN, OCN Head & Neck Oncology Nurse [place] at [facility] Phone # [phone]  Fax # [phone]

## 2021-03-02 ENCOUNTER — Ambulatory Visit (HOSPITAL_COMMUNITY)
Admission: RE | Admit: 2021-03-02 | Discharge: 2021-03-02 | Disposition: A | Discharge status: Home / Self Care | Payer: Medicare Other | Source: Ambulatory Visit | Attending: Hematology and Oncology | Admitting: Hematology and Oncology

## 2021-03-02 DIAGNOSIS — E039 Hypothyroidism, unspecified: Secondary | ICD-10-CM | POA: Diagnosis not present

## 2021-03-02 DIAGNOSIS — Z79890 Hormone replacement therapy (postmenopausal): Secondary | ICD-10-CM | POA: Diagnosis not present

## 2021-03-02 DIAGNOSIS — Z79899 Other long term (current) drug therapy: Secondary | ICD-10-CM | POA: Diagnosis not present

## 2021-03-02 DIAGNOSIS — K219 Gastro-esophageal reflux disease without esophagitis: Secondary | ICD-10-CM | POA: Diagnosis not present

## 2021-03-02 DIAGNOSIS — Z885 Allergy status to narcotic agent status: Secondary | ICD-10-CM | POA: Diagnosis not present

## 2021-03-02 DIAGNOSIS — I2510 Atherosclerotic heart disease of native coronary artery without angina pectoris: Secondary | ICD-10-CM | POA: Diagnosis not present

## 2021-03-02 DIAGNOSIS — E785 Hyperlipidemia, unspecified: Secondary | ICD-10-CM | POA: Diagnosis not present

## 2021-03-02 DIAGNOSIS — Z87891 Personal history of nicotine dependence: Secondary | ICD-10-CM | POA: Diagnosis not present

## 2021-03-02 DIAGNOSIS — C060 Malignant neoplasm of cheek mucosa: Secondary | ICD-10-CM | POA: Insufficient documentation

## 2021-03-02 DIAGNOSIS — Z452 Encounter for adjustment and management of vascular access device: Secondary | ICD-10-CM | POA: Diagnosis not present

## 2021-03-02 DIAGNOSIS — I10 Essential (primary) hypertension: Secondary | ICD-10-CM | POA: Insufficient documentation

## 2021-03-02 HISTORY — PX: IR IMAGING GUIDED PORT INSERTION: IMG5740

## 2021-03-02 MED ORDER — HEPARIN SOD (PORK) LOCK FLUSH 100 UNIT/ML IV SOLN
INTRAVENOUS | Status: AC
  Administered 2021-03-02: 5 [IU]
  Filled 2021-03-02: qty 5

## 2021-03-02 MED ORDER — MIDAZOLAM HCL 2 MG/2ML IJ SOLN
INTRAMUSCULAR | Status: AC | PRN
  Administered 2021-03-02: 1 mg via INTRAVENOUS

## 2021-03-02 MED ORDER — HEPARIN SOD (PORK) LOCK FLUSH 100 UNIT/ML IV SOLN
INTRAVENOUS | Status: AC | PRN
  Administered 2021-03-02: 500 [IU] via INTRAVENOUS

## 2021-03-02 MED ORDER — LIDOCAINE-EPINEPHRINE 1 %-1:100000 IJ SOLN
INTRAMUSCULAR | Status: AC | PRN
  Administered 2021-03-02: 10 mL

## 2021-03-02 MED ORDER — LIDOCAINE-EPINEPHRINE 1 %-1:100000 IJ SOLN
INTRAMUSCULAR | Status: AC
  Administered 2021-03-02: 10 mL via SUBCUTANEOUS
  Filled 2021-03-02: qty 1

## 2021-03-02 MED ORDER — FENTANYL CITRATE (PF) 100 MCG/2ML IJ SOLN
INTRAMUSCULAR | Status: AC
  Filled 2021-03-02: qty 2

## 2021-03-02 MED ORDER — SODIUM CHLORIDE 0.9 % IV SOLN: INTRAVENOUS | Status: DC

## 2021-03-02 MED ORDER — MIDAZOLAM HCL 2 MG/2ML IJ SOLN
INTRAMUSCULAR | Status: AC
  Filled 2021-03-02: qty 4

## 2021-03-02 MED ORDER — FENTANYL CITRATE (PF) 100 MCG/2ML IJ SOLN
INTRAMUSCULAR | Status: AC | PRN
  Administered 2021-03-02: 50 ug via INTRAVENOUS

## 2021-03-02 NOTE — Discharge Instructions (Addendum)
Implanted Port Insertion, Care After  DO NOT use the numbing cream for at least 2 weeks over your port site.  The Cream could cause the skin glue to come off too soon.   This sheet gives you information about how to care for yourself after your procedure. Your health care provider may also give you more specific instructions. If you have problems or questions, contact your health careprovider. What can I expect after the procedure? After the procedure, it is common to have: Discomfort at the port insertion site. Bruising on the skin over the port. This should improve over 3-4 days. Follow these instructions at home: [facility] care After your port is placed, you will get a manufacturer's information card. The card has information about your port. Keep this card with you at all times. Take care of the port as told by your health care provider. Ask your health care provider if you or a family member can get training for taking care of the port at home. Make sure to remember what type of port you have. Incision care   Follow instructions from your health care provider about how to take care of your port insertion site. Make sure you: Wash your hands with soap and water before and after you change your bandage (dressing). If soap and water are not available, use hand sanitizer. Change your dressing as told by your health care provider. Leave skin glue in place. It will flake off as you heal. Check your port insertion site every day for signs of infection. Check for: Redness, swelling, or pain. Fluid or blood. Warmth. Pus or a bad smell. Activity Return to your normal activities as told by your health care provider. Ask your health care provider what activities are safe for you. Do not lift anything that is heavier than 10 lb (4.5 kg) for about 1 week. General instructions Take over-the-counter and prescription medicines only as told by your health care provider. Do not take baths, swim, or use a  hot tub until your site has completely healed. Showering is okay, just don't let the water beat down over the port site directly.  Do not drive for 24 hours due to the sedation drugs you were given. Keep all follow-up visits as told by your health care provider. This is important. Contact a health care provider if: You cannot flush your port with saline as directed, or you cannot draw blood from the port. You have a fever or chills. You have redness, swelling, or pain around your port insertion site. You have fluid or blood coming from your port insertion site. Your port insertion site feels warm to the touch. You have pus or a bad smell coming from the port insertion site. Get help right away if: You have chest pain or shortness of breath. You have bleeding from your port that you cannot control. Summary Take care of the port as told by your health care provider. Keep the manufacturer's information card with you at all times. Change your dressing as told by your health care provider. Contact a health care provider if you have a fever or chills or if you have redness, swelling, or pain around your port insertion site. Keep all follow-up visits as told by your health care provider. This information is not intended to replace advice given to you by your health care provider. Make sure you discuss any questions you have with your healthcare provider. Moderate Conscious Sedation, Adult, Care After This sheet gives you information  about how to care for yourself after your procedure. Your health care provider may also give you more specific instructions. If you have problems or questions, contact your health careprovider. What can I expect after the procedure? After the procedure, it is common to have: Sleepiness for several hours. Impaired judgment for several hours. Difficulty with balance. Vomiting if you eat too soon. Follow these instructions at home: Rest at home for at least 24 hours. For  24 hours:  Do not participate in activities where you could fall or become injured. Do not drive or use machinery. Do not drink alcohol. Do not take sleeping pills or medicines that cause drowsiness. Do not make important decisions or sign legal documents. Do not take care of children on your own. Eating and drinking Follow the diet recommended by your health care provider. Drink enough fluid to keep your urine pale yellow. If you vomit: Drink water, juice, or soup when you can drink without vomiting. Make sure you have little or no nausea before eating solid foods.   General instructions Take over-the-counter and prescription medicines only as told by your health care provider. Have a responsible adult stay with you for the time you are told. It is important to have someone help care for you until you are awake and alert. Do not smoke. Keep all follow-up visits as told by your health care provider. This is important. Contact a health care provider if: You are still sleepy or having trouble with balance after 24 hours. You feel light-headed. You keep feeling nauseous or you keep vomiting. You develop a rash. You have a fever. You have redness or swelling around the IV site. Get help right away if: You have trouble breathing. You have new-onset confusion at home. Summary After the procedure, it is common to feel sleepy, have impaired judgment, or feel nauseous if you eat too soon. Rest after you get home. Know the things you should not do after the procedure. Follow the diet recommended by your health care provider and drink enough fluid to keep your urine pale yellow. Get help right away if you have trouble breathing or new-onset confusion at home. This information is not intended to replace advice given to you by your health care provider. Make sure you discuss any questions you have with your healthcare provider. Document Revised: 10/25/2019 Document Reviewed:  05/23/2019 Elsevier Patient Education  2022 [employer].

## 2021-03-02 NOTE — Consult Note (Signed)
Chief Complaint: Patient was seen in consultation today for  port a cath placement  Referring Physician(s): Iruku,Praveena  Supervising Physician: Arne Cleveland  Patient Status: [facility] - Out-pt  History of Present Illness: Tammy Brady is a 70 y.o. female , ex smoker, with PMH CAD,carotid artery disease, GERD, HLD,HTN, hypothroidism and now with newly diagnosed squamous cell carcinoma of oral cavity ,s/p surgey in July of this year as well as G tube placement at [facility] She presents today port a cath placement for chemotherapy.   Past Medical History:  Diagnosis Date   CAD (coronary artery disease)    Mild non-obstructive disease by cath January 2011   Carotid artery disease ([place])    Followed by Dr Kellie Simmering   GERD (gastroesophageal reflux disease)    Hyperlipidemia    Hypertension    Hypothyroidism     Past Surgical History:  Procedure Laterality Date   APPENDECTOMY      Allergies: Codeine  Medications: Prior to Admission medications   Medication Sig Start Date End Date Taking? Authorizing Provider  acetaminophen (TYLENOL) 500 MG tablet Take 500 mg by mouth every 6 (six) hours as needed.   Yes [provider]  amoxicillin-clavulanate (AUGMENTIN) 875-125 MG tablet Take 1 tablet by mouth 2 (two) times daily. 02/12/21  Yes [provider]  aspirin 325 MG tablet Take 650 mg by mouth as needed for mild pain or moderate pain.   Yes [provider]  calcium carbonate (OS-CAL) 600 MG TABS Take 600 mg by mouth daily.   Yes [provider]  gabapentin (NEURONTIN) 300 MG capsule Take 300 mg by mouth 3 (three) times daily. 02/09/21  Yes [provider]  levothyroxine (SYNTHROID) 112 MCG tablet TAKE 1 TABLET BY MOUTH DAILY 02/22/21  Yes Rip Harbour, NP  Multiple Vitamin (MULTIVITAMIN) tablet Take 1 tablet by mouth daily.   Yes [provider]  simvastatin (ZOCOR) 40 MG tablet TAKE 1 TABLET(40 MG) BY MOUTH AT BEDTIME 02/22/21  Yes  Cox, Kirsten, MD  traZODone (DESYREL) 50 MG tablet Take 1 tablet (50 mg total) by mouth at bedtime. 02/11/21  Yes Marge Duncans, PA-C  dexamethasone (DECADRON) 4 MG tablet Take 2 tablets (8 mg total) by mouth daily. Take daily x 3 days starting the day after cisplatin chemotherapy. Take with food. 02/25/21   Benay Pike, MD  diltiazem (CARDIZEM CD) 360 MG 24 hr capsule TAKE 1 CAPSULE(360 MG) BY MOUTH DAILY 02/22/21   Cox, Elnita Maxwell, MD  lidocaine-prilocaine (EMLA) cream Apply to affected area once 02/25/21   Benay Pike, MD  nitroGLYCERIN (NITROSTAT) 0.4 MG SL tablet Place 1 tablet (0.4 mg total) under the tongue every 5 (five) minutes as needed. 06/20/16   Burnell Blanks, MD  Nutritional Supplements (FEEDING SUPPLEMENT, OSMOLITE 1.5 CAL,) LIQD Place into feeding tube daily. 4-5 cartons daily    [provider]  ondansetron (ZOFRAN) 8 MG tablet Take 1 tablet (8 mg total) by mouth 2 (two) times daily as needed. Start on the third day after cisplatin chemotherapy. 02/25/21   Benay Pike, MD  oxyCODONE (ROXICODONE) 5 MG/5ML solution Take 5 mg by mouth every 6 (six) hours as needed. Patient not taking: No sig reported 01/27/21   [provider]  PERIOGARD 0.12 % solution 30 mLs by Mouth Rinse route daily. 01/27/21   [provider]  prochlorperazine (COMPAZINE) 10 MG tablet Take 1 tablet (10 mg total) by mouth every 6 (six) hours as needed (Nausea or vomiting). 02/25/21  Benay Pike, MD     Family History  Problem Relation Age of Onset   Cancer Mother    Coronary artery disease Father    Coronary artery disease Maternal Grandmother     Social History   Socioeconomic History   Marital status: Married    Spouse name: Not on file   Number of children: Not on file   Years of education: Not on file   Highest education level: Not on file  Occupational History   Occupation: Dealer    Employer: [employer]  Tobacco Use   Smoking status: Former     Packs/day: 1.00    Years: 40.00    Pack years: 40.00    Types: Cigarettes    Quit date: 05/11/2009    Years since quitting: 11.8   Smokeless tobacco: Never  Substance and Sexual Activity   Alcohol use: Not Currently   Drug use: No   Sexual activity: Not Currently  Other Topics Concern   Not on file  Social History Narrative   Not on file   Social Determinants of Health   Financial Resource Strain: Low Risk    Difficulty of Paying Living Expenses: Not hard at all  Food Insecurity: No Food Insecurity   Worried About Charity fundraiser in the Last Year: Never true   [place] in the Last Year: Never true  Transportation Needs: No Transportation Needs   Lack of Transportation (Medical): No   Lack of Transportation (Non-Medical): No  Physical Activity: Not on file  Stress: No Stress Concern Present   Feeling of Stress : Only a little  Social Connections: Engineer, building services of Communication with Friends and Family: More than three times a week   Frequency of Social Gatherings with Friends and Family: More than three times a week   Attends Religious Services: More than 4 times per year   Active Member of [employer] or Organizations: Yes   Attends Music therapist: More than 4 times per year   Marital Status: Married      Review of Systems denies fever, CP, dyspnea, cough, abd pain, back pain,N/V or bleeding; she does have occ HA, dysphagia  Vital Signs: BP 123/61   Pulse 60   Temp 97.6 F (36.4 C) (Oral)   SpO2 96%   Physical Exam awake/alert; chest- CTA bilat; heart- RRR; abd- soft, intact G tube, NT/ND,+ BS; some mild LLE edema from recent surgical grafting procedure  Imaging: No results found.  Labs:  CBC: Recent Labs    07/30/20 0913 12/08/20 1356  WBC 8.2 9.8  HGB 14.0 12.2  HCT 42.6 36.7  PLT 266 289    COAGS: No results for input(s): INR, APTT in the last 8760 hours.  BMP: Recent Labs    07/30/20 0913 08/07/20 1011  08/26/20 0927 12/08/20 1356  NA 141 147* 141 142  K 5.6* 5.6* 5.1 4.4  CL 104 107* 101 103  CO2 24 18* 24 22  GLUCOSE 92 94 83 89  BUN '17 19 15 15  '$ CALCIUM 9.4 9.4 10.0 8.7  CREATININE 0.74 0.74 0.84 0.71  GFRNONAA 84 84 72  --   GFRAA 97 97 83  --     LIVER FUNCTION TESTS: Recent Labs    07/30/20 0913 08/07/20 1011 08/26/20 0927 12/08/20 1356  BILITOT <0.2 <0.2 <0.2 <0.2  AST '22 28 16 '$ 39  ALT '14 15 11 24  '$ ALKPHOS 82 88 89 84  PROT  6.4 6.7 6.7 6.3  ALBUMIN 4.2 4.1 4.3 4.2    TUMOR MARKERS: No results for input(s): AFPTM, CEA, CA199, CHROMGRNA in the last 8760 hours.  Assessment and Plan: 70 y.o. female , ex smoker, with PMH CAD,carotid artery disease, GERD, HLD,HTN, hypothroidism and now with newly diagnosed squamous cell carcinoma of oral cavity ,s/p surgey in July of this year as well as G tube placement at [facility]. She presents today port a cath placement for chemotherapy.Risks and benefits of image guided port-a-catheter placement was discussed with the patient including, but not limited to bleeding, infection, pneumothorax, or fibrin sheath development and need for additional procedures.  All of the patient's questions were answered, patient is agreeable to proceed. Consent signed and in chart.    Thank you for this interesting consult.  I greatly enjoyed meeting Tammy Brady and look forward to participating in their care.  A copy of this report was sent to the requesting provider on this date.  Electronically Signed: D. Rowe Robert, PA-C 03/02/2021, 10:29 AM   I spent a total of 25 minutes    in face to face in clinical consultation, greater than 50% of which was counseling/coordinating care for port a cath placement

## 2021-03-02 NOTE — Procedures (Signed)
  Procedure: R IJ port placement   EBL:   minimal Complications:  none immediate  See full dictation in [facility] PACS.  D. Marcelina Mclaurin MD Main # [phone] Pager  [phone]    

## 2021-03-03 ENCOUNTER — Ambulatory Visit: Payer: Medicare Other | Admitting: Hematology and Oncology

## 2021-03-03 ENCOUNTER — Other Ambulatory Visit: Payer: Medicare Other

## 2021-03-03 NOTE — Progress Notes (Signed)
Pharmacist Chemotherapy Monitoring - Initial Assessment    Anticipated start date: 03/10/21  The following has been reviewed per standard work regarding the patient's treatment regimen: The patient's diagnosis, treatment plan and drug doses, and organ/hematologic function Lab orders and baseline tests specific to treatment regimen  The treatment plan start date, drug sequencing, and pre-medications Prior authorization status  Patient's documented medication list, including drug-drug interaction screen and prescriptions for anti-emetics and supportive care specific to the treatment regimen The drug concentrations, fluid compatibility, administration routes, and timing of the medications to be used The patient's access for treatment and lifetime cumulative dose history, if applicable  The patient's medication allergies and previous infusion related reactions, if applicable   Changes made to treatment plan:  treatment plan date and pre-medications    Follow up needed:  Pending authorization for treatment    Kimoni Pagliarulo, Jacqlyn Larsen, [facility], 03/03/2021  11:22 AM

## 2021-03-04 ENCOUNTER — Ambulatory Visit: Payer: Medicare Other

## 2021-03-05 DIAGNOSIS — Z4659 Encounter for fitting and adjustment of other gastrointestinal appliance and device: Secondary | ICD-10-CM | POA: Diagnosis not present

## 2021-03-05 DIAGNOSIS — Z09 Encounter for follow-up examination after completed treatment for conditions other than malignant neoplasm: Secondary | ICD-10-CM | POA: Diagnosis not present

## 2021-03-05 DIAGNOSIS — Z510 Encounter for antineoplastic radiation therapy: Secondary | ICD-10-CM | POA: Diagnosis not present

## 2021-03-05 DIAGNOSIS — C411 Malignant neoplasm of mandible: Secondary | ICD-10-CM | POA: Diagnosis not present

## 2021-03-05 DIAGNOSIS — C031 Malignant neoplasm of lower gum: Secondary | ICD-10-CM | POA: Diagnosis not present

## 2021-03-08 ENCOUNTER — Other Ambulatory Visit: Payer: Self-pay

## 2021-03-08 ENCOUNTER — Ambulatory Visit: Payer: Medicare Other

## 2021-03-08 ENCOUNTER — Ambulatory Visit
Admission: RE | Admit: 2021-03-08 | Discharge: 2021-03-08 | Disposition: A | Discharge status: Home / Self Care | Payer: Medicare Other | Source: Ambulatory Visit | Attending: Radiation Oncology | Admitting: Radiation Oncology

## 2021-03-08 DIAGNOSIS — C031 Malignant neoplasm of lower gum: Secondary | ICD-10-CM

## 2021-03-08 DIAGNOSIS — Z510 Encounter for antineoplastic radiation therapy: Secondary | ICD-10-CM | POA: Diagnosis not present

## 2021-03-08 DIAGNOSIS — C411 Malignant neoplasm of mandible: Secondary | ICD-10-CM | POA: Diagnosis not present

## 2021-03-08 MED ORDER — SONAFINE EX EMUL
1.0000 "application " | Freq: Two times a day (BID) | CUTANEOUS | Status: DC
  Administered 2021-03-08: 1 via TOPICAL

## 2021-03-08 NOTE — Progress Notes (Signed)
Pt here for patient teaching.    Pt given Radiation and You booklet, Managing Acute Radiation Side Effects for Head and Neck Cancer handout, skin care instructions, and Sonafine.    Reviewed areas of pertinence such as fatigue, hair loss, mouth changes, skin changes, throat changes, earaches, and taste changes .   Pt able to give teach back of to pat skin, use unscented/gentle soap, and drink plenty of water,apply Sonafine bid and avoid applying anything to skin within 4 hours of treatment.   Pt demonstrated understanding and verbalizes understanding of information given and will contact nursing with any questions or concerns.    [email or web address]/index

## 2021-03-08 NOTE — Progress Notes (Signed)
Oncology Nurse Navigator Documentation   To provide support, encouragement and care continuity, met with Tammy Brady for her initial RT.  She was accompanied by her husband. Ms. Loney completed treatment without difficulty, denied questions/concerns. I reviewed the registration/arrival procedure for subsequent treatments. I encouraged them to call me with questions/concerns as tmts proceed. I discussed her upcoming appointments this week including chemotherapy at the [facility] location and meeting with the Swallowing Therapist and PT after her radiation treatment on Thursday 03/11/21   Harlow Asa RN, BSN, [place] at [facility] Phone # [phone]  Fax # [phone]

## 2021-03-08 NOTE — Progress Notes (Signed)
[place] NOTE  Patient Care Team: Marge Duncans, Hershal Coria as PCP - General (Physician Assistant)  CHIEF COMPLAINTS/PURPOSE OF CONSULTATION:  SCC Oral mucosa  ASSESSMENT & PLAN:   No orders of the defined types were placed in this encounter.  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  Her pathologic staging is pT3 N3B. Given positive margins and presence of extracapsular extension, we agreed with adjuvant chemoradiation. She started radiation on 03/08/2021 and scheduled for the first cycle of chemotherapy on 03/10/2021. She is doing well since last visit, using G tube primarily, also can drink some milk shakes. Labs from today reviewed, ok to proceed with planned chemotherapy tomorrow. Encouraged hydration and RTC in one week as scheduled.  HISTORY OF PRESENTING ILLNESS:  Tammy Brady 70 y.o. female is here because of  SCC oral mucosa  This is a pleasant 70 year old female patient who first presented to her dentist with a painful lesion to her gums noticed more like at the end of 2021.  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  Subsequently she was referred to Dr. Amada Jupiter on December 14, 2020 who discussed surgical intervention with patient.  She had neck CT which showed an enhancing soft tissue lesion along the buccal margin of left mandible with a lucency in the adjacent bone consistent with known SCC.  She was also found to have necrotic left level 1A/PN2/3B lymph nodes noted to be concerning for nodal metastasis.  She underwent resection on January 18, 2021  which revealed 3.3 cm tumor, grade 2 squamous cell carcinoma of the alveolar ridge extending to buccal gingival junction and lingual sulcus, depth of invasion 11 mm, negative margins, 6 out of 49 lymph nodes positive bilaterally with extracapsular extension, no perineural invasion or lymphovascular invasion.  She is now referred to medical oncology for consideration of adjuvant chemoradiation.  PATHOLOGY REPORT: Diagnosis     A: Mouth, RMT mucosal margin, excision - Benign squamous mucosa and submucosa with no tumor seen.   B: Mouth, left buccal margin, excision - Benign squamous mucosa and submucosa with no tumor seen.   C: Mouth, labial mucosal margin, excision - Benign squamous mucosa and submucosa with no tumor seen.   D: Mouth, anterior gingival mucosal margin, excision - Benign squamous mucosa and submucosa with no tumor seen.   E: Mouth, floor of mouth, biopsy - Benign squamous mucosa and submucosa with no tumor seen.   F: Tongue, ventral tongue, biopsy - Benign squamous mucosa and submucosa with no tumor seen.   G: Bone marrow, anterior mandible, biopsy - Fragment of blood clot, no tumor seen, on original frozen section only, tissue has been cut through on permanent sections.   H: Bone marrow, posterior mandible, biopsy - Fragment of blood clot, no tumor seen, on original frozen section only, tissue has been cut through on permanent sections.   I: Lymph node, left neck level 1B and level 1A, lymphadenectomy - Metastatic squamous cell carcinoma with cystic change, involving 2 of 4 lymph nodes (2/4), size of largest metastasis 2.2 cm. - Benign submandibular gland is also present.  J: Lymph node, right level 1B, lymphadenectomy - Metastatic squamous cell carcinoma involving 2 of 4 lymph nodes (2/4), with cystic change, size of largest metastasis 2.0 cm in greatest dimension, with extranodal extension of tumor identified. - Submandibular gland present with no tumor seen.   K:  Lymph node, left EJ, excision - No tumor seen in three lymph nodes (0/3).   L: Lymph node, left neck, level 2, lymphadenectomy - No tumor seen in 14 lymph nodes (0/14).   M: Lymph node, left neck, level 3, lymphadenectomy - Metastatic squamous cell carcinoma involving 1 of 7 lymph nodes (1/7), size of metastasis 2.2 cm, with extranodal extension of tumor identified.   N: Lymph node, left neck, level 4, lymphadenectomy - Metastatic squamous cell carcinoma involving 1 of 2 lymph nodes (1/2), size of metastasis 1 mm.   O: Lymph node, right neck, level 2, lymphadenectomy - No tumor seen in eight lymph nodes (0/8).   P: Lymph node, right neck, level 3, lymphadenectomy - No tumor seen in four lymph nodes (0/4).   Q: Lymph node, right neck, level 4, lymphadenectomy - No tumor seen in three lymph nodes (0/3).   R: Left composite mandibular resection (segmental mandibulectomy and resection of submental gland) - Squamous cell carcinoma, moderately differentiated, of alveolar ridge extending to buccal gingival junction and lingual sulcus. - Tumor size 3.3 cm in greatest lateral dimension, depth of invasion 1.1 cm. - Tumor involves the alveolar bone and does not exceed the depth of the roots of the teeth, and does not extend into the body of the mandible.  - Surgical margins free of tumor (soft tissue and bone margins all negative for tumor) - Submental gland present with no tumor seen.   S: Teeth, extraction - Multiple intact and fragmented teeth (gross examination only).     Given positive margin and extracapsular extension, we have discussed about adjuvant chemoradiation with weekly cisplatin.  She started first day of radiation on 03/08/2021 and is scheduled for the first cycle of chemotherapy on 03/10/2021. She is doing very well overall, no complaints.  ROS Constitutional: Denies fevers, chills or abnormal night sweats Eyes: Denies blurriness of vision, double vision or watery  eyes Ears, nose, mouth, throat, and face: Denies mucositis or sore throat Respiratory: Denies cough, dyspnea or wheezes Cardiovascular: Denies palpitation, chest discomfort or lower extremity swelling Gastrointestinal:  Denies nausea, heartburn or change in bowel habits Skin: Denies abnormal skin rashes Lymphatics: Denies new lymphadenopathy or easy bruising Neurological:Denies numbness, tingling or new weaknesses Behavioral/Psych: Mood is stable, no new changes  All other systems were reviewed with the patient and are negative.  MEDICAL HISTORY:  Past Medical History:  Diagnosis Date   CAD (coronary artery disease)    Mild non-obstructive disease by cath January 2011   Carotid artery disease (HCC)    Followed by Dr Kellie Simmering   GERD (gastroesophageal reflux disease)    Hyperlipidemia    Hypertension    Hypothyroidism     SURGICAL HISTORY: Past Surgical History:  Procedure Laterality Date   APPENDECTOMY     IR IMAGING GUIDED PORT INSERTION  03/02/2021    SOCIAL HISTORY: Social History   Socioeconomic History   Marital status: Married    Spouse name: Not on file   Number of children: Not on file   Years of education: Not on file   Highest education level: Not on file  Occupational History   Occupation: Dealer    Employer: [employer]  Tobacco Use  Smoking status: Former    Packs/day: 1.00    Years: 40.00    Pack years: 40.00    Types: Cigarettes    Quit date: 05/11/2009    Years since quitting: 11.8   Smokeless tobacco: Never  Substance and Sexual Activity   Alcohol use: Not Currently   Drug use: No   Sexual activity: Not Currently  Other Topics Concern   Not on file  Social History Narrative   Not on file   Social Determinants of Health   Financial Resource Strain: Low Risk    Difficulty of Paying Living Expenses: Not hard at all  Food Insecurity: No Food Insecurity   Worried About Charity fundraiser in the Last Year: Never true   Dresser  in the Last Year: Never true  Transportation Needs: No Transportation Needs   Lack of Transportation (Medical): No   Lack of Transportation (Non-Medical): No  Physical Activity: Not on file  Stress: No Stress Concern Present   Feeling of Stress : Only a little  Social Connections: Engineer, building services of Communication with Friends and Family: More than three times a week   Frequency of Social Gatherings with Friends and Family: More than three times a week   Attends Religious Services: More than 4 times per year   Active Member of [employer] or Organizations: Yes   Attends Music therapist: More than 4 times per year   Marital Status: Married  Human resources officer Violence: Not on file    FAMILY HISTORY: Family History  Problem Relation Age of Onset   Cancer Mother    Coronary artery disease Father    Coronary artery disease Maternal Grandmother     ALLERGIES:  is allergic to codeine.  MEDICATIONS:  Current Outpatient Medications  Medication Sig Dispense Refill   acetaminophen (TYLENOL) 500 MG tablet Take 500 mg by mouth every 6 (six) hours as needed.     aspirin 325 MG tablet Take 650 mg by mouth as needed for mild pain or moderate pain.     calcium carbonate (OS-CAL) 600 MG TABS Take 600 mg by mouth daily.     dexamethasone (DECADRON) 4 MG tablet Take 2 tablets (8 mg total) by mouth daily. Take daily x 3 days starting the day after cisplatin chemotherapy. Take with food. 30 tablet 1   diltiazem (CARDIZEM CD) 360 MG 24 hr capsule TAKE 1 CAPSULE(360 MG) BY MOUTH DAILY 90 capsule 0   gabapentin (NEURONTIN) 300 MG capsule Take 300 mg by mouth 3 (three) times daily.     levothyroxine (SYNTHROID) 112 MCG tablet TAKE 1 TABLET BY MOUTH DAILY 90 tablet 0   lidocaine-prilocaine (EMLA) cream Apply to affected area once 30 g 3   Multiple Vitamin (MULTIVITAMIN) tablet Take 1 tablet by mouth daily.     nitroGLYCERIN (NITROSTAT) 0.4 MG SL tablet Place 1 tablet (0.4 mg total)  under the tongue every 5 (five) minutes as needed. 25 tablet 6   Nutritional Supplements (FEEDING SUPPLEMENT, OSMOLITE 1.5 CAL,) LIQD Place into feeding tube daily. 4-5 cartons daily     ondansetron (ZOFRAN) 8 MG tablet Take 1 tablet (8 mg total) by mouth 2 (two) times daily as needed. Start on the third day after cisplatin chemotherapy. 30 tablet 1   Polyethylene Glycol 3350 (MIRALAX PO) Take by mouth as needed.     prochlorperazine (COMPAZINE) 10 MG tablet Take 1 tablet (10 mg total) by mouth every 6 (six) hours as needed (  Nausea or vomiting). 30 tablet 1   simvastatin (ZOCOR) 40 MG tablet TAKE 1 TABLET(40 MG) BY MOUTH AT BEDTIME 90 tablet 0   traZODone (DESYREL) 50 MG tablet Take 1 tablet (50 mg total) by mouth at bedtime. 90 tablet 1   amoxicillin-clavulanate (AUGMENTIN) 875-125 MG tablet Take 1 tablet by mouth 2 (two) times daily.     oxyCODONE (ROXICODONE) 5 MG/5ML solution Take 5 mg by mouth every 6 (six) hours as needed.     PERIOGARD 0.12 % solution 30 mLs by Mouth Rinse route daily.     No current facility-administered medications for this visit.    PHYSICAL EXAMINATION:  ECOG PERFORMANCE STATUS: 0 - Asymptomatic  Vitals:   03/09/21 1348  BP: (!) 140/52  Pulse: 64  Temp: 98.7 F (37.1 C)  SpO2: 97%    Filed Weights   03/09/21 1348  Weight: 164 lb (74.4 kg)    General: Alert, oriented and in no acute distress Graft site on leg healing well No concerns.  LABORATORY DATA:  I have reviewed the data as listed Lab Results  Component Value Date   WBC 8.7 03/09/2021   HGB 11.0 (L) 03/09/2021   HCT 34.3 (L) 03/09/2021   MCV 91.5 03/09/2021   PLT 265 03/09/2021     Chemistry      Component Value Date/Time   NA 138 03/09/2021 1307   NA 142 12/08/2020 1356   K 4.2 03/09/2021 1307   CL 102 03/09/2021 1307   CO2 26 03/09/2021 1307   BUN 15 03/09/2021 1307   BUN 15 12/08/2020 1356   CREATININE 0.73 03/09/2021 1307      Component Value Date/Time   CALCIUM 9.5  03/09/2021 1307   ALKPHOS 84 12/08/2020 1356   AST 39 12/08/2020 1356   ALT 24 12/08/2020 1356   BILITOT <0.2 12/08/2020 1356     Have reviewed pertinent pathology report.  RADIOGRAPHIC STUDIES: I have personally reviewed the radiological images as listed and agreed with the findings in the report. IR IMAGING GUIDED PORT INSERTION  Result Date: 03/02/2021 CLINICAL DATA:  Squamous cell carcinoma of the oral mucosa EXAM: TUNNELED PORT CATHETER PLACEMENT WITH ULTRASOUND AND FLUOROSCOPIC GUIDANCE FLUOROSCOPY TIME:  12 seconds; 1 mGy ANESTHESIA/SEDATION: Intravenous Fentanyl 150mg and Versed '2mg'$  were administered as conscious sedation during continuous monitoring of the patient's level of consciousness and physiological / cardiorespiratory status by the radiology RN, with a total moderate sedation time of 19 minutes. TECHNIQUE: The procedure, risks, benefits, and alternatives were explained to the patient. Questions regarding the procedure were encouraged and answered. The patient understands and consents to the procedure. Patency of the right IJ vein was confirmed with ultrasound with image documentation. An appropriate skin site was determined. Skin site was marked. Region was prepped using maximum barrier technique including cap and mask, sterile gown, sterile gloves, large sterile sheet, and Chlorhexidine as cutaneous antisepsis. The region was infiltrated locally with 1% lidocaine. Under real-time ultrasound guidance, the right IJ vein was accessed with a 21 gauge micropuncture needle; the needle tip within the vein was confirmed with ultrasound image documentation. Needle was exchanged over a 018 guidewire for transitional dilator, and vascular measurement was performed. A small incision was made on the right anterior chest wall and a subcutaneous pocket fashioned. The power-injectable port was positioned and its catheter tunneled to the right IJ dermatotomy site. The transitional dilator was  exchanged over an Amplatz wire for a peel-away sheath, through which the port catheter, which had been  trimmed to the appropriate length, was advanced and positioned under fluoroscopy with its tip at the cavoatrial junction. Spot chest radiograph confirms good catheter position and no pneumothorax. The port was flushed per protocol. The pocket was closed with deep interrupted and subcuticular continuous 3-0 Monocryl sutures. The incisions were covered with Dermabond then covered with a sterile dressing. The patient tolerated the procedure well. COMPLICATIONS: COMPLICATIONS None immediate IMPRESSION: Technically successful right IJ power-injectable port catheter placement. Ready for routine use. Electronically Signed   By: Lucrezia Europe M.D.   On: 03/02/2021 15:09    All questions were answered. The patient knows to call the clinic with any problems, questions or concerns. I spent 20 minutes in the care of this patient including H and P, review of records, counseling and coordination of care.     Benay Pike, MD 03/09/2021 4:17 PM

## 2021-03-09 ENCOUNTER — Encounter: Payer: Self-pay | Admitting: Hematology and Oncology

## 2021-03-09 ENCOUNTER — Other Ambulatory Visit: Payer: Self-pay

## 2021-03-09 ENCOUNTER — Ambulatory Visit
Admission: RE | Admit: 2021-03-09 | Discharge: 2021-03-09 | Disposition: A | Discharge status: Home / Self Care | Payer: Medicare Other | Source: Ambulatory Visit | Attending: Radiation Oncology | Admitting: Radiation Oncology

## 2021-03-09 ENCOUNTER — Inpatient Hospital Stay: Payer: Medicare Other | Admitting: Hematology and Oncology

## 2021-03-09 ENCOUNTER — Encounter: Payer: Medicare Other | Admitting: Dietician

## 2021-03-09 ENCOUNTER — Inpatient Hospital Stay: Payer: Medicare Other | Admitting: Dietician

## 2021-03-09 ENCOUNTER — Inpatient Hospital Stay: Payer: Medicare Other

## 2021-03-09 ENCOUNTER — Ambulatory Visit: Payer: Medicare Other | Admitting: Hematology and Oncology

## 2021-03-09 ENCOUNTER — Other Ambulatory Visit: Payer: Medicare Other

## 2021-03-09 VITALS — BP 140/52 | HR 64 | Temp 98.7°F | Ht 66.0 in | Wt 164.0 lb

## 2021-03-09 DIAGNOSIS — Z95828 Presence of other vascular implants and grafts: Secondary | ICD-10-CM | POA: Insufficient documentation

## 2021-03-09 DIAGNOSIS — E039 Hypothyroidism, unspecified: Secondary | ICD-10-CM

## 2021-03-09 DIAGNOSIS — C411 Malignant neoplasm of mandible: Secondary | ICD-10-CM | POA: Diagnosis not present

## 2021-03-09 DIAGNOSIS — Z87891 Personal history of nicotine dependence: Secondary | ICD-10-CM | POA: Diagnosis not present

## 2021-03-09 DIAGNOSIS — C060 Malignant neoplasm of cheek mucosa: Secondary | ICD-10-CM

## 2021-03-09 DIAGNOSIS — Z510 Encounter for antineoplastic radiation therapy: Secondary | ICD-10-CM | POA: Diagnosis not present

## 2021-03-09 DIAGNOSIS — Z5111 Encounter for antineoplastic chemotherapy: Secondary | ICD-10-CM | POA: Diagnosis not present

## 2021-03-09 LAB — CBC WITH DIFFERENTIAL (CANCER CENTER ONLY)
Abs Immature Granulocytes: 0.01 10*3/uL (ref 0.00–0.07)
Basophils Absolute: 0.1 10*3/uL (ref 0.0–0.1)
Basophils Relative: 1 %
Eosinophils Absolute: 0.4 10*3/uL (ref 0.0–0.5)
Eosinophils Relative: 4 %
HCT: 34.3 % — ABNORMAL LOW (ref 36.0–46.0)
Hemoglobin: 11 g/dL — ABNORMAL LOW (ref 12.0–15.0)
Immature Granulocytes: 0 %
Lymphocytes Relative: 26 %
Lymphs Abs: 2.2 10*3/uL (ref 0.7–4.0)
MCH: 29.3 pg (ref 26.0–34.0)
MCHC: 32.1 g/dL (ref 30.0–36.0)
MCV: 91.5 fL (ref 80.0–100.0)
Monocytes Absolute: 0.8 10*3/uL (ref 0.1–1.0)
Monocytes Relative: 9 %
Neutro Abs: 5.2 10*3/uL (ref 1.7–7.7)
Neutrophils Relative %: 60 %
Platelet Count: 265 10*3/uL (ref 150–400)
RBC: 3.75 MIL/uL — ABNORMAL LOW (ref 3.87–5.11)
RDW: 14.6 % (ref 11.5–15.5)
WBC Count: 8.7 10*3/uL (ref 4.0–10.5)
nRBC: 0 % (ref 0.0–0.2)

## 2021-03-09 LAB — BASIC METABOLIC PANEL - CANCER CENTER ONLY
Anion gap: 10 (ref 5–15)
BUN: 15 mg/dL (ref 8–23)
CO2: 26 mmol/L (ref 22–32)
Calcium: 9.5 mg/dL (ref 8.9–10.3)
Chloride: 102 mmol/L (ref 98–111)
Creatinine: 0.73 mg/dL (ref 0.44–1.00)
GFR, Estimated: >60 mL/min (ref >60)
Glucose, Bld: 112 mg/dL — ABNORMAL HIGH (ref 70–99)
Potassium: 4.2 mmol/L (ref 3.5–5.1)
Sodium: 138 mmol/L (ref 135–145)

## 2021-03-09 LAB — TSH: TSH: 0.655 u[IU]/mL (ref 0.308–3.960)

## 2021-03-09 LAB — MAGNESIUM: Magnesium: 1.8 mg/dL (ref 1.7–2.4)

## 2021-03-09 MED ORDER — HEPARIN SOD (PORK) LOCK FLUSH 100 UNIT/ML IV SOLN
500.0000 [IU] | Freq: Once | INTRAVENOUS | Status: AC
  Administered 2021-03-09: 500 [IU]

## 2021-03-09 MED ORDER — SODIUM CHLORIDE 0.9% FLUSH
10.0000 mL | Freq: Once | INTRAVENOUS | Status: AC
  Administered 2021-03-09: 10 mL

## 2021-03-09 NOTE — Progress Notes (Signed)
Nutrition Assessment   Reason for Assessment: Provider request   ASSESSMENT: 70 year old female with SCC of oral cavity s/p resection and reconstruction with PEG placement 7/11 at [facility]. S/p gastrostomy tube replacement 8/5. Patient is receiving adjuvant chemoradiation therapy with weekly cisplatin. Patient is followed by Dr. Isidore Moos and Dr. Chryl Heck.  Met with patient and husband in clinic following second radiation treatment. She reports doing well, says she has  slight redness under chin. Patient is using Elma cream for this. Patient denies nausea, vomiting, constipation, diarrhea. She reports dry mouth in the morning s/p surgery. Patient reports she is drinking lots of water, has 1 ginger ale and eating some soft foods orally (pudding, ice cream, cottage cheese, mashed potatoes, peach cobbler) Patient is giving 1 carton Osmolite 1.5 four times daily. She flushes with 90 ml water before and after feedings. Patient reports tolerating formula and maintaining weights with current regimen and oral intake. Her husband who is very supportive helps her with tube feedings. Patient receives formula and supplies from [facility] She reports company has not contacted her this month to inquire about delivery needs.   Nutrition Focused Physical Exam: deferred   Medications: decadron, zofran, compazine, oxycodone, gabapentin, MVI, Os-cal, Periogard   Labs: reviewed   Anthropometrics: Weights have increased 4 lbs from 160 lb in 2 weeks. Weights decreased 11 lbs (6.3%) from usual body weight in 7 months. This is insignificant for time frame  Height: 5'6" Weight: 74.4 kg UBW: 175 lb (07/30/20) BMI: 26.47   Estimated Energy Needs  Kcals: 2230-2455 Protein: 112-134 Fluid: 2.3 L   NUTRITION DIAGNOSIS: Inadequate oral intake related to cancer and associated treatments as evidenced by SCC of oral cavity s/p resection, dysphagia with solid foods s/p PEG for nutrition support  INTERVENTION:  Continue  eating soft, moist high calorie, high protein foods as tolerated - handout with recipes provided Encouraged drinking oral nutrition supplement for added calories and protein, Ensure Complete, [employer] Shake samples provided Continue 1 carton Osmolite 1.5 QID with 90 ml water flush before and after each feeding. Patient understands she will increase tube feedings if weight decreases and unable to tolerate oral nutrition Patient receiving formula and supplies from [facility]. [place] [phone]) on patient behalf to request additional formula. Per intake representative, patient to receive shipment 24-48 hours Contact information provided   MONITORING, EVALUATION, GOAL: Patient will tolerated adequate calories and protein to minimize weight loss    Next Visit: Wednesday September 7 in infusion

## 2021-03-09 NOTE — Progress Notes (Signed)
Met with patient at registration to introduce myself as Financial Resource Specialist and to offer available resources.  Discussed one-time $1000 [employer] grant and qualifications to assist with personal expenses while going through treatment.  Gave her my card if interested in applying and for any additional financial questions or concerns.   

## 2021-03-10 ENCOUNTER — Ambulatory Visit
Admission: RE | Admit: 2021-03-10 | Discharge: 2021-03-10 | Disposition: A | Discharge status: Home / Self Care | Payer: Medicare Other | Source: Ambulatory Visit | Attending: Radiation Oncology | Admitting: Radiation Oncology

## 2021-03-10 ENCOUNTER — Inpatient Hospital Stay: Payer: Medicare Other

## 2021-03-10 VITALS — BP 115/50 | HR 59 | Temp 97.6°F | Resp 20 | Wt 164.2 lb

## 2021-03-10 DIAGNOSIS — C060 Malignant neoplasm of cheek mucosa: Secondary | ICD-10-CM

## 2021-03-10 DIAGNOSIS — Z5111 Encounter for antineoplastic chemotherapy: Secondary | ICD-10-CM | POA: Diagnosis not present

## 2021-03-10 DIAGNOSIS — C411 Malignant neoplasm of mandible: Secondary | ICD-10-CM | POA: Diagnosis not present

## 2021-03-10 DIAGNOSIS — E039 Hypothyroidism, unspecified: Secondary | ICD-10-CM | POA: Diagnosis not present

## 2021-03-10 DIAGNOSIS — J620 Pneumoconiosis due to talc dust: Secondary | ICD-10-CM | POA: Diagnosis not present

## 2021-03-10 DIAGNOSIS — Z510 Encounter for antineoplastic radiation therapy: Secondary | ICD-10-CM | POA: Diagnosis not present

## 2021-03-10 DIAGNOSIS — Z87891 Personal history of nicotine dependence: Secondary | ICD-10-CM | POA: Diagnosis not present

## 2021-03-10 DIAGNOSIS — R2689 Other abnormalities of gait and mobility: Secondary | ICD-10-CM | POA: Diagnosis not present

## 2021-03-10 DIAGNOSIS — R1310 Dysphagia, unspecified: Secondary | ICD-10-CM | POA: Diagnosis not present

## 2021-03-10 MED ORDER — POTASSIUM CHLORIDE IN NACL 20-0.9 MEQ/L-% IV SOLN
INTRAVENOUS | Status: DC
  Filled 2021-03-10 (×2): qty 1000

## 2021-03-10 MED ORDER — DEXAMETHASONE SODIUM PHOSPHATE 100 MG/10ML IJ SOLN
10.0000 mg | Freq: Once | INTRAMUSCULAR | Status: AC
  Administered 2021-03-10: 10 mg via INTRAVENOUS
  Filled 2021-03-10: qty 10

## 2021-03-10 MED ORDER — MAGNESIUM SULFATE 2 GM/50ML IV SOLN
2.0000 g | Freq: Once | INTRAVENOUS | Status: AC
  Administered 2021-03-10: 2 g via INTRAVENOUS
  Filled 2021-03-10: qty 50

## 2021-03-10 MED ORDER — HEPARIN SOD (PORK) LOCK FLUSH 100 UNIT/ML IV SOLN
500.0000 [IU] | Freq: Once | INTRAVENOUS | Status: AC | PRN
  Administered 2021-03-10: 500 [IU]

## 2021-03-10 MED ORDER — PALONOSETRON HCL INJECTION 0.25 MG/5ML: 0.2500 mg | Freq: Once | INTRAVENOUS | Status: DC

## 2021-03-10 MED ORDER — SODIUM CHLORIDE 0.9 % IV SOLN
150.0000 mg | Freq: Once | INTRAVENOUS | Status: AC
  Administered 2021-03-10: 150 mg via INTRAVENOUS
  Filled 2021-03-10: qty 150

## 2021-03-10 MED ORDER — SODIUM CHLORIDE 0.9 % IV SOLN: INTRAVENOUS | Status: DC

## 2021-03-10 MED ORDER — SODIUM CHLORIDE 0.9 % IV SOLN
40.0000 mg/m2 | Freq: Once | INTRAVENOUS | Status: AC
  Administered 2021-03-10: 74 mg via INTRAVENOUS
  Filled 2021-03-10: qty 74

## 2021-03-10 MED ORDER — SODIUM CHLORIDE 0.9% FLUSH
10.0000 mL | INTRAVENOUS | Status: DC | PRN
  Administered 2021-03-10: 10 mL

## 2021-03-10 MED ORDER — SODIUM CHLORIDE 0.9 % IV SOLN: Freq: Once | INTRAVENOUS | Status: AC

## 2021-03-10 NOTE — Patient Instructions (Signed)
[facility]  Discharge Instructions: Thank you for choosing [facility] to provide your oncology and hematology care.   If you have a lab appointment with the [facility], please go directly to the [facility] and check in at the registration area.  Wear comfortable clothing and clothing appropriate for easy access to any Portacath or PICC line.   We strive to give you quality time with your provider. You may need to reschedule your appointment if you arrive late (15 or more minutes).  Arriving late affects you and other patients whose appointments are after yours.  Also, if you miss three or more appointments without notifying the office, you may be dismissed from the clinic at the provider's discretion.      For prescription refill requests, have your pharmacy contact our office and allow 72 hours for refills to be completed.    Today you received the following chemotherapy and/or immunotherapy agents Cisplatin       To help prevent nausea and vomiting after your treatment, we encourage you to take your nausea medication as directed.  BELOW ARE SYMPTOMS THAT SHOULD BE REPORTED IMMEDIATELY: *FEVER GREATER THAN 100.4 F (38 C) OR HIGHER *CHILLS OR SWEATING *NAUSEA AND VOMITING THAT IS NOT CONTROLLED WITH YOUR NAUSEA MEDICATION *UNUSUAL SHORTNESS OF BREATH *UNUSUAL BRUISING OR BLEEDING *URINARY PROBLEMS (pain or burning when urinating, or frequent urination) *BOWEL PROBLEMS (unusual diarrhea, constipation, pain near the anus) TENDERNESS IN MOUTH AND THROAT WITH OR WITHOUT PRESENCE OF ULCERS (sore throat, sores in mouth, or a toothache) UNUSUAL RASH, SWELLING OR PAIN  UNUSUAL VAGINAL DISCHARGE OR ITCHING   Items with * indicate a potential emergency and should be followed up as soon as possible or go to the Emergency Department if any problems should occur.  Please show the CHEMOTHERAPY ALERT CARD or IMMUNOTHERAPY ALERT CARD at check-in  to the Emergency Department and triage nurse. Should you have questions after your visit or need to cancel or reschedule your appointment, please contact [facility]  [phone] and follow the prompts.  Office hours are 8:00 a.m. to 4:30 p.m. Monday - Friday. Please note that voicemails left after 4:00 p.m. may not be returned until the following business day.  We are closed weekends and major holidays. You have access to a nurse at all times for urgent questions. Please call the main number to the clinic [phone] and follow the prompts.  For any non-urgent questions, you may also contact your provider using MyChart. We now offer e-Visits for anyone 29 and older to request care online for non-urgent symptoms. For details visit [email or web address].   Also download the MyChart app! Go to the app store, search "MyChart", open the app, select [facility], and log in with your MyChart username and password.  Due to Covid, a mask is required upon entering the hospital/clinic. If you do not have a mask, one will be given to you upon arrival. For doctor visits, patients may have 1 support person aged 30 or older with them. For treatment visits, patients cannot have anyone with them due to current Covid guidelines and our immunocompromised population. Cisplatin injection What is this medication? CISPLATIN (SIS pla tin) is a chemotherapy drug. It targets fast dividing cells, like cancer cells, and causes these cells to die. This medicine is used to treat many types of cancer like bladder, ovarian, and testicular cancers. This medicine may be used for other purposes;  ask your health care provider or pharmacist if you have questions. COMMON BRAND NAME(S): Platinol, Platinol -AQ What should I tell my care team before I take this medication? They need to know if you have any of these conditions: eye disease, vision problems hearing problems kidney disease low blood counts,  like white cells, platelets, or red blood cells tingling of the fingers or toes, or other nerve disorder an unusual or allergic reaction to cisplatin, carboplatin, oxaliplatin, other medicines, foods, dyes, or preservatives pregnant or trying to get pregnant breast-feeding How should I use this medication? This drug is given as an infusion into a vein. It is administered in a hospital or clinic by a specially trained health care professional. Talk to your pediatrician regarding the use of this medicine in children. Special care may be needed. Overdosage: If you think you have taken too much of this medicine contact a poison control center or emergency room at once. NOTE: This medicine is only for you. Do not share this medicine with others. What if I miss a dose? It is important not to miss a dose. Call your doctor or health care professional if you are unable to keep an appointment. What may interact with this medication? This medicine may interact with the following medications: foscarnet certain antibiotics like amikacin, gentamicin, neomycin, polymyxin B, streptomycin, tobramycin, vancomycin This list may not describe all possible interactions. Give your health care provider a list of all the medicines, herbs, non-prescription drugs, or dietary supplements you use. Also tell them if you smoke, drink alcohol, or use illegal drugs. Some items may interact with your medicine. What should I watch for while using this medication? Your condition will be monitored carefully while you are receiving this medicine. You will need important blood work done while you are taking this medicine. This drug may make you feel generally unwell. This is not uncommon, as chemotherapy can affect healthy cells as well as cancer cells. Report any side effects. Continue your course of treatment even though you feel ill unless your doctor tells you to stop. This medicine may increase your risk of getting an infection.  Call your healthcare professional for advice if you get a fever, chills, or sore throat, or other symptoms of a cold or flu. Do not treat yourself. Try to avoid being around people who are sick. Avoid taking medicines that contain aspirin, acetaminophen, ibuprofen, naproxen, or ketoprofen unless instructed by your healthcare professional. These medicines may hide a fever. This medicine may increase your risk to bruise or bleed. Call your doctor or health care professional if you notice any unusual bleeding. Be careful brushing and flossing your teeth or using a toothpick because you may get an infection or bleed more easily. If you have any dental work done, tell your dentist you are receiving this medicine. Do not become pregnant while taking this medicine or for 14 months after stopping it. Women should inform their healthcare professional if they wish to become pregnant or think they might be pregnant. Men should not father a child while taking this medicine and for 11 months after stopping it. There is potential for serious side effects to an unborn child. Talk to your healthcare professional for more information. Do not breast-feed an infant while taking this medicine. This medicine has caused ovarian failure in some women. This medicine may make it more difficult to get pregnant. Talk to your healthcare professional if you are concerned about your fertility. This medicine has caused decreased sperm counts  in some men. This may make it more difficult to father a child. Talk to your healthcare professional if you are concerned about your fertility. Drink fluids as directed while you are taking this medicine. This will help protect your kidneys. Call your doctor or health care professional if you get diarrhea. Do not treat yourself. What side effects may I notice from receiving this medication? Side effects that you should report to your doctor or health care professional as soon as possible: allergic  reactions like skin rash, itching or hives, swelling of the face, lips, or tongue blurred vision changes in vision decreased hearing or ringing of the ears nausea, vomiting pain, redness, or irritation at site where injected pain, tingling, numbness in the hands or feet signs and symptoms of bleeding such as bloody or black, tarry stools; red or dark brown urine; spitting up blood or brown material that looks like coffee grounds; red spots on the skin; unusual bruising or bleeding from the eyes, gums, or nose signs and symptoms of infection like fever; chills; cough; sore throat; pain or trouble passing urine signs and symptoms of kidney injury like trouble passing urine or change in the amount of urine signs and symptoms of low red blood cells or anemia such as unusually weak or tired; feeling faint or lightheaded; falls; breathing problems Side effects that usually do not require medical attention (report to your doctor or health care professional if they continue or are bothersome): loss of appetite mouth sores muscle cramps This list may not describe all possible side effects. Call your doctor for medical advice about side effects. You may report side effects to FDA at 1-800-FDA-1088. Where should I keep my medication? This drug is given in a hospital or clinic and will not be stored at home. NOTE: This sheet is a summary. It may not cover all possible information. If you have questions about this medicine, talk to your doctor, pharmacist, or health care provider.  2022 Elsevier/Gold Standard (2018-06-22 15:59:17)

## 2021-03-11 ENCOUNTER — Ambulatory Visit: Payer: Medicare Other

## 2021-03-11 ENCOUNTER — Ambulatory Visit
Admission: RE | Admit: 2021-03-11 | Discharge: 2021-03-11 | Disposition: A | Discharge status: Home / Self Care | Payer: Medicare Other | Source: Ambulatory Visit | Attending: Radiation Oncology | Admitting: Radiation Oncology

## 2021-03-11 ENCOUNTER — Ambulatory Visit: Payer: Medicare Other | Attending: Radiation Oncology | Admitting: Physical Therapy

## 2021-03-11 ENCOUNTER — Encounter: Payer: Self-pay | Admitting: Physical Therapy

## 2021-03-11 ENCOUNTER — Other Ambulatory Visit: Payer: Self-pay

## 2021-03-11 DIAGNOSIS — R1311 Dysphagia, oral phase: Secondary | ICD-10-CM

## 2021-03-11 DIAGNOSIS — C060 Malignant neoplasm of cheek mucosa: Secondary | ICD-10-CM | POA: Diagnosis not present

## 2021-03-11 DIAGNOSIS — M6281 Muscle weakness (generalized): Secondary | ICD-10-CM | POA: Diagnosis not present

## 2021-03-11 DIAGNOSIS — M25611 Stiffness of right shoulder, not elsewhere classified: Secondary | ICD-10-CM | POA: Diagnosis not present

## 2021-03-11 DIAGNOSIS — L599 Disorder of the skin and subcutaneous tissue related to radiation, unspecified: Secondary | ICD-10-CM | POA: Insufficient documentation

## 2021-03-11 DIAGNOSIS — R471 Dysarthria and anarthria: Secondary | ICD-10-CM | POA: Diagnosis not present

## 2021-03-11 DIAGNOSIS — M25612 Stiffness of left shoulder, not elsewhere classified: Secondary | ICD-10-CM | POA: Diagnosis not present

## 2021-03-11 DIAGNOSIS — Z483 Aftercare following surgery for neoplasm: Secondary | ICD-10-CM | POA: Diagnosis not present

## 2021-03-11 DIAGNOSIS — R262 Difficulty in walking, not elsewhere classified: Secondary | ICD-10-CM | POA: Diagnosis not present

## 2021-03-11 DIAGNOSIS — I890 Lymphedema, not elsewhere classified: Secondary | ICD-10-CM | POA: Insufficient documentation

## 2021-03-11 DIAGNOSIS — C411 Malignant neoplasm of mandible: Secondary | ICD-10-CM | POA: Diagnosis not present

## 2021-03-11 NOTE — Patient Instructions (Signed)
SWALLOWING EXERCISES Do these until 6 months after your last day of radiation, then 2-3 times per week afterwards  Effortful Swallows - Press your tongue against the roof of your mouth for 3 seconds, then squeeze the muscles in your neck while you swallow your saliva or a sip of water - Repeat 10-15 times, 2-3 times a day, and use whenever you eat or drink  [place] Swallow - swallow with your tongue sticking out - Stick tongue out past your lips and gently bite tongue with your teeth - Swallow, while holding your tongue with your teeth - Repeat 10-15 times, 2-3 times a day *use a wet spoon if your mouth gets dry*  Mendelsohn Maneuver - "half swallow" exercise - Start to swallow, and keep your Adam's apple up by squeezing hard with the muscles of the throat - Hold the squeeze for 5-7 seconds and then relax - Repeat 10-15 times, 2-3 times a day *use a wet spoon if your mouth gets dry*  Tongue Stretch/Teeth Clean - Move your tongue around the pocket between your gums and teeth, clockwise and then counter-clockwise - Repeat on the back side, clockwise and then counter-clockwise - Repeat 15-20 times, 2-3 times a day  Chin pushback - Open your mouth  - Place your fist UNDER your chin near your neck - Tuck your chin and push back with your fist for 5 seconds - Repeat 10 times, 2-3 times a day       6.   "Super Swallow"  - Take a breath and hold it  - Bear down (like pushing your bowels)  - Swallow then IMMEDIATELY cough  - Repeat 10 times, 2-3 times a day

## 2021-03-11 NOTE — Therapy (Signed)
[place], [place], [address] Phone: [phone]   Fax:  [phone]  Physical Therapy Evaluation  Patient Details  Name: Tammy Brady MRN: [id number] Date of Birth: 1951-03-29 Referring Provider (PT): Reita May Date: 03/11/2021   PT End of Session - 03/11/21 0859     Visit Number 1    Number of Visits 13    Date for PT Re-Evaluation 04/22/21    PT Start Time 0809    PT Stop Time 0852    PT Time Calculation (min) 43 min    Activity Tolerance Patient tolerated treatment well    Behavior During Therapy [facility] for tasks assessed/performed             Past Medical History:  Diagnosis Date   CAD (coronary artery disease)    Mild non-obstructive disease by cath January 2011   Carotid artery disease ([place])    Followed by Dr Kellie Simmering   GERD (gastroesophageal reflux disease)    Hyperlipidemia    Hypertension    Hypothyroidism     Past Surgical History:  Procedure Laterality Date   APPENDECTOMY     IR IMAGING GUIDED PORT INSERTION  03/02/2021    There were no vitals filed for this visit.    Subjective Assessment - 03/11/21 0808     Subjective I feel pretty good. Yesterday I had my first chemo and I got nauseated going home. I feel good today. I feel a little shaky.    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 CT neck same day revealed an enhancing soft tissue lesion along the buccal margin of the left mandible; with lucency in the adjacent bone consistent with known SCC. Also found were necrotic left level IA/B and II/IIIB lymph nodes, noted to be concerning for nodal metastasis. During physical exam performed at this visit, Dr. Amada Jupiter visualized the exophytic mass in and around the left mandibular canine, premolar, and first molar, along with an enlarged level 1A lymph node. The patient  opted to proceed with multiple oral biopsies, mandibular resectioning, and lymphadenectomies, 01/18/21 Resection performed on 01/18/21 revealed the following: 3.3cm tumor, Gr 2 Squamous cell carcinoma of alveolar ridge extending to buccal gingival junction and lingual sulcus, DOI 21m, neg margins, 6/49 LN+ bilaterally with ECE; no PNI or LVSI; She also underwent Left fibula osteocutaneous free flap and ORIF mandible, will receive 30 fractions of radiation to her oral cavity and bilateral neck and 6 weeks of cisplatin chemotherapy. She started on 8/29 and will complete 04/19/21, [facility] and exchanged on 8/5 due to leakage. She uses PEG for most nutrition and medication. She is able to tolerate pureed foods but her reconstructive flap limits her swallowing, [facility], reports a 40 pack year smoking history, quitting off and on over the years, stopped smoking in May 2022.    Patient Stated Goals to gain info from providers    Currently in Pain? No/denies    Pain Score 0-No pain                [facility] PT Assessment - 03/11/21 0001       Assessment   Medical Diagnosis mandibular SCC    Referring Provider (PT) SIsidore Moos   Onset Date/Surgical Date 11/26/20    Hand Dominance Right    Prior Therapy none  Precautions   Precautions Other (comment)    Precaution Comments active cancer      Restrictions   Weight Bearing Restrictions No      Balance Screen   Has the patient fallen in the past 6 months No    Has the patient had a decrease in activity level because of a fear of falling?  No    Is the patient reluctant to leave their home because of a fear of falling?  No      Home Ecologist residence    Living Arrangements Spouse/significant other    Available Help at Discharge Family    Type of [place] Bed/bath upstairs      Prior Function   Level of [place] Retired     Leisure has returned to walking- currently walking about 20 min daily - prior to surgery was walking 2 miles daily      Cognition   Overall Cognitive Status Within Functional Limits for tasks assessed      Observation/Other Assessments   Observations healing skin graft at left lower leg      Functional Tests   Functional tests Sit to Stand      Sit to Stand   Comments 30 sec sit to stand: 7 reps which is poor for her age      Posture/Postural Control   Postural Limitations Rounded Shoulders;Forward head      ROM / Strength   AROM / PROM / Strength AROM      AROM   Overall AROM Comments bilateral shoulder flexion 25% limited, bilateral shoulder abduction 75% limited    AROM Assessment Site Shoulder    Cervical Flexion WFL    Cervical Extension 50% limited    Cervical - Right Side Bend 50% limited    Cervical - Left Side Bend 25% limited    Cervical - Right Rotation WFL    Cervical - Left Rotation 25% limited      Ambulation/Gait   Ambulation/Gait Yes    Ambulation/Gait Assistance 7: Independent    Ambulation Distance (Feet) 15 Feet    Gait Pattern Poor foot clearance - left;Poor foot clearance - right               LYMPHEDEMA/ONCOLOGY QUESTIONNAIRE - 03/11/21 0001       Lymphedema Assessments   Lymphedema Assessments Head and Neck      Head and Neck   4 cm superior to sternal notch around neck 36.7 cm    6 cm superior to sternal notch around neck 38.3 cm    8 cm superior to sternal notch around neck 40.5 cm    Other +                     Objective measurements completed on examination: See above findings.               PT Education - 03/11/21 0959     Education Details Neck ROM, importance of posture when sitting, standing and lying down, deep breathing, walking program and importance of staying active throughout treatment, CURE article on staying active, "Why exercise?" flyer, lymphedema and PT info    Person(s) Educated Patient     Methods Explanation;Handout    Comprehension Verbalized understanding                 PT Long Term Goals - 03/11/21 [id number]  PT LONG TERM GOAL #1   Title Pt will be independent in self MLD for long term management of lymphedema.    Time 6    Period Weeks    Status New    Target Date 04/22/21      PT LONG TERM GOAL #2   Title Pt will obtain appropriate compression garments for long term management of lymphedema.    Time 6    Period Weeks    Status New    Target Date 04/22/21      PT LONG TERM GOAL #3   Title Pt will report she is able to descend steps with a step through gait pattern and a decreased fear of falling.    Baseline currently step to    Time 6    Period Weeks    Status New    Target Date 04/22/21      PT LONG TERM GOAL #4   Title Pt will be independent in a home exercise program for continued stretching and strengthening.    Time 6    Period Weeks    Status New    Target Date 04/22/21      PT LONG TERM GOAL #5   Title Pt will report her shoulder ROM has improved and she is no longer having trouble getting dressed or reaching overhead.    Time 6    Period Weeks    Status New    Target Date 04/22/21      Additional Long Term Goals   Additional Long Term Goals Yes      PT LONG TERM GOAL #6   Title Pt will be able to complete 13 sit to stands in 30 seconds without use of UEs to decrease fall risk.    Baseline 7    Time 6    Period Weeks    Status New    Target Date 04/22/21                Head and Neck Clinic Goals - 03/11/21 0959       Patient will be able to verbalize understanding of a home exercise program for cervical range of motion, posture, and walking.          Time 1    Period Days    Status Achieved      Patient will be able to verbalize understanding of proper sitting and standing posture.          Time 1    Period Days    Status Achieved      Patient will be able to verbalize understanding of lymphedema risk and  availability of treatment for this condition.          Time 1    Period Days    Status Achieved                Plan - 03/11/21 0900     Clinical Impression Statement Pt presents to PT with recent diagnosis of mandibular squamous cell carcinoma. She recently underwent a mandibular resection with left fibula osteocutaneous free flap and ORIF mandible on 01/18/21. She will be completing chemo and radiation and will complete on 04/19/21. Pt has been in a boot until just last week. She still has a bandage over her left lower leg where the skin graft is still healing. Pt reports that she is fearful of falling especially when descending steps and goes down the steps in a step to gait pattern. She also  reports limited shoulder ROM since all of this began and has difficulty doing her hair and getting dressed. Her shoulder abduction is 75% limited. She has a scar across her neck that decreases her cervical extension and causes increased tightness. She presents with lymphedema in her neck due to removal of lymph nodes and this could worsen with radiation. Pt was only able to complete 7 sit to stand sin 30 seconds without using her hands. She reports she showers with her walker due to decreased balance when she closes her eyes to wash her hair. She would benefit from skilled PT services to decrease neck lymphedema, assist pt with obtaining appropriate compression garments, improve bilateral shoulder ROM and improve LE strength to decrease fall risk and improve functinal mobility.    Personal Factors and Comorbidities Fitness    Examination-Activity Limitations Dressing;Reach Overhead;Lift;Stairs    Examination-Participation Restrictions Yard Work;Community Activity;Medication Management;Cleaning;Laundry    Stability/Clinical Decision Making Evolving/Moderate complexity    Clinical Decision Making Moderate    Rehab Potential Good    PT Frequency 2x / week    PT Duration 6 weeks    PT  Treatment/Interventions ADLs/Self Care Home Management;Therapeutic exercise;Balance training;Neuromuscular re-education;Stair training;Gait training;Patient/family education;Orthotic Fit/Training;Manual techniques;Manual lymph drainage;Compression bandaging;Scar mobilization;Passive range of motion;Taping;Vasopneumatic Device;Joint Manipulations    PT Next Visit Plan measure bilat shoulder ROM and update last goal to include measurements- begin MLD and instruct pt and spouse/ issue handout, scar mobilization, PROM to neck, give HEP for LE strengthening, once lymphedema is managed begin bilateral shoulder ROM and LE strengthening and balance    PT Home Exercise Plan head and neck ROM exercises    Consulted and Agree with Plan of Care Patient;Family member/caregiver    Family Member Consulted spouse             Patient will benefit from skilled therapeutic intervention in order to improve the following deficits and impairments:  Postural dysfunction, Impaired UE functional use, Impaired flexibility, Increased fascial restricitons, Decreased strength, Decreased knowledge of use of DME, Decreased knowledge of precautions, Decreased range of motion, Decreased scar mobility, Increased edema, Difficulty walking, Decreased balance  Visit Diagnosis: Lymphedema, not elsewhere classified  Disorder of the skin and subcutaneous tissue related to radiation, unspecified  Aftercare following surgery for neoplasm  Muscle weakness (generalized)  Difficulty in walking, not elsewhere classified  Stiffness of left shoulder, not elsewhere classified  Stiffness of right shoulder, not elsewhere classified  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[place])     Problem List Patient Active Problem List   Diagnosis Date Noted   Port-A-Cath in place 03/09/2021   Carcinoma of lower gum ([place]) 02/20/2021   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[place]) 12/08/2020   Gastritis 12/08/2020   Anxiety 12/08/2020   Need for  prophylactic vaccination and inoculation against influenza 07/30/2020   Depression 11/26/2015   GERD (gastroesophageal reflux disease) 11/26/2015   Migraine headache 11/26/2015   Screening for diabetes mellitus 09/03/2015   Hypothyroidism 07/22/2009   Mixed hyperlipidemia 07/22/2009   Tobacco user 07/22/2009   Hypertension, essential 07/22/2009   CAD, NATIVE VESSEL 07/22/2009   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   Coronary artery spasm ([place]) 07/22/2009    Allyson Sabal [facility] 03/11/2021, 10:03 AM  [place], [place], [address] Phone: [phone]   Fax:  [phone]  Name: Tammy Brady MRN: [id number] Date of Birth: 03-29-1951  Manus Gunning, PT 03/11/21 10:04 AM

## 2021-03-11 NOTE — Therapy (Signed)
[facility] [address] [place], [place], [address] Phone: [phone]   Fax:  [phone]  Speech Language Pathology Evaluation  Patient Details  Name: Tammy Brady MRN: [id number] Date of Birth: [birth year] Referring Provider (SLP): Eppie Gibson, MD   Encounter Date: 03/11/2021   End of Session - 03/11/21 1318     Visit Number 1    Number of Visits 4    Date for SLP Re-Evaluation 06/09/21    SLP Start Time 0850    SLP Stop Time  0930    SLP Time Calculation (min) 40 min    Activity Tolerance Patient tolerated treatment well             Past Medical History:  Diagnosis Date   CAD (coronary artery disease)    Mild non-obstructive disease by cath January 2011   Carotid artery disease ([place])    Followed by Dr Kellie Simmering   GERD (gastroesophageal reflux disease)    Hyperlipidemia    Hypertension    Hypothyroidism     Past Surgical History:  Procedure Laterality Date   APPENDECTOMY     IR IMAGING GUIDED PORT INSERTION  03/02/2021    There were no vitals filed for this visit.   Subjective Assessment - 03/11/21 0901     Subjective Pt ahs had pudding, applesauce, finely chopped stew beef, mashed potatoes, soup.    Currently in Pain? No/denies                SLP Evaluation [facility] - 03/11/21 [id number]       SLP Visit Information   SLP Received On 03/11/21    Referring Provider (SLP) Eppie Gibson, MD    Onset Date 2021    Medical Diagnosis Mandibular SCC post mandibular resection with free flap      Subjective   Patient/Family Stated Goal Keep swallowing as WNL as possible      General Information   HPI See below      Prior Functional Status   Cognitive/Linguistic Baseline Within functional limits      Cognition   Overall Cognitive Status Within Functional Limits for tasks assessed      Auditory Comprehension   Overall Auditory Comprehension Appears within functional limits for tasks assessed       Oral Motor/Sensory Function   Overall Oral Motor/Sensory Function Impaired    Labial ROM Reduced left    Labial Strength Reduced Left   lower   Labial Coordination Reduced    Lingual ROM Reduced right;Reduced left    Lingual Symmetry Within Functional Limits    Lingual Strength Other (comment)    Lingual Sensation --   difficult to asess due to flap   Lingual Coordination Reduced   due to flap   Facial Sensation Reduced   left lower tender, lower lip and chin numb   Mandible Impaired    Overall Oral Motor/Sensory Function bilabials and /f/, /v/ distorted      Motor Speech   Overall Motor Speech Impaired    Intelligibility Intelligibility reduced    Word 75-100% accurate    Phrase 75-100% accurate    Sentence 75-100% accurate    Conversation 75-100% accurate            HPI: Left Mandibular Squamous Cell Carcinoma, stage IVB (T3, 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 She was then referred to [facility] and a biopsy  completed on 11/26/20 revealed SCC of the left mandible. She was referred by her PCP to Dr. Amada Jupiter at [facility]. 12/14/20 Consult with Dr. Amada Jupiter to discuss surgical interventions. CT neck same day revealed an enhancing soft tissue lesion along the buccal margin of the left mandible; with lucency in the adjacent bone consistent with known SCC. Also found were necrotic left level IA/B and II/IIIB lymph nodes, noted to be concerning for nodal metastasis.  Dr. Amada Jupiter also visualized the exophytic mass in and around the left mandibular canine, premolar, and first molar, along with an enlarged level 1A lymph node. The patient opted to proceed with multiple oral biopsies, mandibular resectioning, and lymphadenectomies. 01/18/21 Resection performed on 01/18/21 revealed the following: 3.3cm tumor, Gr 2 Squamous cell carcinoma of alveolar ridge extending to buccal gingival junction and lingual sulcus, DOI 80m, neg  margins, 6/49 LN+ bilaterally with ECE; no PNI or LVSI; She also underwent Left fibula osteocutaneous free flap and ORIF mandible. She will receive 30 fractions of radiation to her oral cavity and bilateral neck and 6 weeks of cisplatin chemotherapy. She started on 8/29 and will complete 04/19/21.    Structually, SLP appreciated immediately surgical changes with pt's free flap as this structure was impeding pt's labial closure and articulation of bilabials and /f/ and /v/ cuasing a minimal dysarthria which minimally affected pt's speech intelligibility. This was functional with mild careful listening by SLP.   Pt currently tolerates dys II(chopped/minced)/thin liquids. POs: Pt without overt s/s aspiration with chopped/minced [place] meat and water today. Noted pt placed meat in mid-posterior rt oral cavity and performed liquid wash with 2/2 bites, which she states she does regularly. She performs oral care after every meal and sometimes a fourth time /day as well. Thyroid elevation appeared adequate, and swallows appeared timely with slight increased oral prep/mastication time.   Because data states the risk for dysphagia during and after radiation treatment is high due to undergoing radiation tx, SLP taught pt about the possibility of reduced/limited ability for PO intake during rad tx. SLP encouraged pt to continue swallowing POs as far into rad tx as possible, even ingesting POs and/or completing HEP shortly after administration of pain meds. Among other modifications for days when pt cannot functionally swallow, SLP talked about performing only non-swallowing tasks on the handout/HEP.   SLP educated pt re: changes to swallowing musculature after rad tx, and why adherence to dysphagia HEP provided today and PO consumption was necessary to inhibit muscular disuse atrophy and to reduce muscle fibrosis following rad tx. Pt demonstrated understanding of these things to SLP.    SLP then developed a  HEP for pt and pt was instructed how to perform exercises involving lingual, vocal, and pharyngeal strengthening. SLP performed each exercise and pt return demonstrated each exercise. SLP ensured pt performance was correct prior to moving on to next exercise. Pt was instructed to complete this program 2-3 times a day, 6-7 days/week until 6 months after her last rad tx, then x2-3 a week after that.                   SLP Education - 03/11/21 1318     Education Details late effects head/neck radiation on swallow function, HEP procedure    Person(s) Educated Patient;Spouse    Methods Explanation;Demonstration;Verbal cues;Handout    Comprehension Verbalized understanding;Returned demonstration;Verbal cues required;Need further instruction              SLP Short Term Goals - 03/11/21 1322  SLP SHORT TERM GOAL #1   Title pt will complete HEP with rare min A    Time 1    Period --   visit, for all STGs   Status New      SLP SHORT TERM GOAL #2   Title pt will tell SLP why pt is completing HEP with modified independence    Time 1    Status New      SLP SHORT TERM GOAL #3   Title pt will describe 3 overt s/s aspiration PNA with modified independence    Time 2    Status New      SLP SHORT TERM GOAL #4   Title pt will tell SLP how a food journal could hasten return to a more normalized diet    Time 3    Status New              SLP Long Term Goals - 03/11/21 1323       SLP LONG TERM GOAL #1   Title pt will complete HEP with modified independence over two visits    Time 4    Period --   visits, for all LTGs   Status New      SLP LONG TERM GOAL #2   Title pt will describe how to modify HEP over time, and the timeline associated with reduction in HEP frequency with modified independence over two sessions    Time 5    Status New              Plan - 03/11/21 1319     Clinical Impression Statement At this time pt exhibits at least oral stage  dysphagia due mainly to surgical changes with mandible. Tammy Brady swallowing is deemed WNL/WFL with dys II (minched/chopped)/thin with modifications for small bites/sips and liquid wash if needed, Today pt had minced sliced [place] meat and water via straw without overt s/s aspiration. SLP designed an individualized HEP for dysphagia and pt completed each exercise on their own with iniital mod cues, faded to modified independent.  Data indicate that pt's swallow ability will likely decrease over the course of radiation therapy and could very well decline over time following conclusion of their radiation therapy due to muscle disuse atrophy and/or muscle fibrosis. Pt will cont to need to be seen by SLP in order to assess safety of PO intake, assess the need for recommending any objective swallow assessment, and ensuring pt correctly completes the individualized HEP.    Speech Therapy Frequency --   once approx every 4 weeks   Duration --   90 days   Treatment/Interventions Aspiration precaution training;Pharyngeal strengthening exercises;Diet toleration management by SLP;Trials of upgraded texture/liquids;Internal/external aids;Patient/family education;Compensatory strategies;SLP instruction and feedback    Potential to Achieve Goals Good    SLP Home Exercise Plan provided    Consulted and Agree with Plan of Care Patient             Patient will benefit from skilled therapeutic intervention in order to improve the following deficits and impairments:   Dysphagia, oral phase    Problem List Patient Active Problem List   Diagnosis Date Noted   Port-A-Cath in place 03/09/2021   Carcinoma of lower gum ([place]) 02/20/2021   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[place]) 12/08/2020   Gastritis 12/08/2020   Anxiety 12/08/2020   Need for prophylactic vaccination and inoculation against influenza 07/30/2020   Depression 11/26/2015   GERD (gastroesophageal reflux disease) 11/26/2015   Migraine  headache  11/26/2015   Screening for diabetes mellitus 09/03/2015   Hypothyroidism 07/22/2009   Mixed hyperlipidemia 07/22/2009   Tobacco user 07/22/2009   Hypertension, essential 07/22/2009   CAD, NATIVE VESSEL 07/22/2009   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   Coronary artery spasm ([place]) 07/22/2009    [place] ,[place], [place]  03/11/2021, 5:25 PM  [facility] [address] [place], [place], [address] Phone: [phone]   Fax:  [phone]  Name: Tammy Brady MRN: [id number] Date of Birth: 1951/03/25

## 2021-03-11 NOTE — Progress Notes (Signed)
Oncology Nurse Navigator Documentation   I met with Tammy Brady and her husband before [facility] today. She reports nausea/vomiting after her first chemotherapy yesterday but was able to instill supplement in her PEG today without developing nausea. I have notified Dr. Chryl Heck of the episode. Ms. Mauzy knows to call me if she has any questions or needs.  Harlow Asa RN, BSN, OCN Head & Neck Oncology Nurse [place] [facility] Phone # [phone]  Fax # [phone]

## 2021-03-12 ENCOUNTER — Ambulatory Visit
Admission: RE | Admit: 2021-03-12 | Discharge: 2021-03-12 | Disposition: A | Discharge status: Home / Self Care | Payer: Medicare Other | Source: Ambulatory Visit | Attending: Radiation Oncology | Admitting: Radiation Oncology

## 2021-03-12 DIAGNOSIS — C411 Malignant neoplasm of mandible: Secondary | ICD-10-CM | POA: Diagnosis not present

## 2021-03-12 DIAGNOSIS — C060 Malignant neoplasm of cheek mucosa: Secondary | ICD-10-CM | POA: Diagnosis not present

## 2021-03-16 ENCOUNTER — Other Ambulatory Visit: Payer: Self-pay

## 2021-03-16 ENCOUNTER — Ambulatory Visit
Admission: RE | Admit: 2021-03-16 | Discharge: 2021-03-16 | Disposition: A | Discharge status: Home / Self Care | Payer: Medicare Other | Source: Ambulatory Visit | Attending: Radiation Oncology | Admitting: Radiation Oncology

## 2021-03-16 DIAGNOSIS — C060 Malignant neoplasm of cheek mucosa: Secondary | ICD-10-CM | POA: Diagnosis not present

## 2021-03-16 DIAGNOSIS — C411 Malignant neoplasm of mandible: Secondary | ICD-10-CM | POA: Diagnosis not present

## 2021-03-17 ENCOUNTER — Encounter: Payer: Self-pay | Admitting: Hematology and Oncology

## 2021-03-17 ENCOUNTER — Inpatient Hospital Stay: Payer: Medicare Other

## 2021-03-17 ENCOUNTER — Other Ambulatory Visit: Payer: Medicare Other

## 2021-03-17 ENCOUNTER — Inpatient Hospital Stay: Payer: Medicare Other | Attending: Hematology and Oncology | Admitting: Hematology and Oncology

## 2021-03-17 ENCOUNTER — Inpatient Hospital Stay: Payer: Medicare Other | Admitting: Dietician

## 2021-03-17 ENCOUNTER — Ambulatory Visit
Admission: RE | Admit: 2021-03-17 | Discharge: 2021-03-17 | Disposition: A | Discharge status: Home / Self Care | Payer: Medicare Other | Source: Ambulatory Visit | Attending: Radiation Oncology | Admitting: Radiation Oncology

## 2021-03-17 VITALS — BP 139/56 | HR 66 | Temp 97.8°F | Resp 18 | Wt 161.4 lb

## 2021-03-17 DIAGNOSIS — R634 Abnormal weight loss: Secondary | ICD-10-CM | POA: Insufficient documentation

## 2021-03-17 DIAGNOSIS — J620 Pneumoconiosis due to talc dust: Secondary | ICD-10-CM | POA: Diagnosis not present

## 2021-03-17 DIAGNOSIS — T451X5A Adverse effect of antineoplastic and immunosuppressive drugs, initial encounter: Secondary | ICD-10-CM | POA: Insufficient documentation

## 2021-03-17 DIAGNOSIS — D701 Agranulocytosis secondary to cancer chemotherapy: Secondary | ICD-10-CM | POA: Diagnosis not present

## 2021-03-17 DIAGNOSIS — Z95828 Presence of other vascular implants and grafts: Secondary | ICD-10-CM

## 2021-03-17 DIAGNOSIS — R2689 Other abnormalities of gait and mobility: Secondary | ICD-10-CM | POA: Diagnosis not present

## 2021-03-17 DIAGNOSIS — D649 Anemia, unspecified: Secondary | ICD-10-CM | POA: Diagnosis not present

## 2021-03-17 DIAGNOSIS — K5903 Drug induced constipation: Secondary | ICD-10-CM | POA: Insufficient documentation

## 2021-03-17 DIAGNOSIS — D696 Thrombocytopenia, unspecified: Secondary | ICD-10-CM | POA: Diagnosis not present

## 2021-03-17 DIAGNOSIS — K1231 Oral mucositis (ulcerative) due to antineoplastic therapy: Secondary | ICD-10-CM | POA: Diagnosis not present

## 2021-03-17 DIAGNOSIS — C060 Malignant neoplasm of cheek mucosa: Secondary | ICD-10-CM | POA: Insufficient documentation

## 2021-03-17 DIAGNOSIS — R112 Nausea with vomiting, unspecified: Secondary | ICD-10-CM | POA: Diagnosis not present

## 2021-03-17 DIAGNOSIS — Z5111 Encounter for antineoplastic chemotherapy: Secondary | ICD-10-CM | POA: Diagnosis not present

## 2021-03-17 DIAGNOSIS — C411 Malignant neoplasm of mandible: Secondary | ICD-10-CM | POA: Diagnosis not present

## 2021-03-17 DIAGNOSIS — Z87891 Personal history of nicotine dependence: Secondary | ICD-10-CM | POA: Insufficient documentation

## 2021-03-17 DIAGNOSIS — R1310 Dysphagia, unspecified: Secondary | ICD-10-CM | POA: Diagnosis not present

## 2021-03-17 LAB — CBC WITH DIFFERENTIAL (CANCER CENTER ONLY)
Abs Immature Granulocytes: 0.05 10*3/uL (ref 0.00–0.07)
Basophils Absolute: 0 10*3/uL (ref 0.0–0.1)
Basophils Relative: 0 %
Eosinophils Absolute: 0.3 10*3/uL (ref 0.0–0.5)
Eosinophils Relative: 2 %
HCT: 34.3 % — ABNORMAL LOW (ref 36.0–46.0)
Hemoglobin: 11.5 g/dL — ABNORMAL LOW (ref 12.0–15.0)
Immature Granulocytes: 1 %
Lymphocytes Relative: 12 %
Lymphs Abs: 1.3 10*3/uL (ref 0.7–4.0)
MCH: 29.2 pg (ref 26.0–34.0)
MCHC: 33.5 g/dL (ref 30.0–36.0)
MCV: 87.1 fL (ref 80.0–100.0)
Monocytes Absolute: 1.1 10*3/uL — ABNORMAL HIGH (ref 0.1–1.0)
Monocytes Relative: 10 %
Neutro Abs: 7.9 10*3/uL — ABNORMAL HIGH (ref 1.7–7.7)
Neutrophils Relative %: 75 %
Platelet Count: 284 10*3/uL (ref 150–400)
RBC: 3.94 MIL/uL (ref 3.87–5.11)
RDW: 14.1 % (ref 11.5–15.5)
WBC Count: 10.6 10*3/uL — ABNORMAL HIGH (ref 4.0–10.5)
nRBC: 0 % (ref 0.0–0.2)

## 2021-03-17 LAB — BASIC METABOLIC PANEL - CANCER CENTER ONLY
Anion gap: 11 (ref 5–15)
BUN: 18 mg/dL (ref 8–23)
CO2: 26 mmol/L (ref 22–32)
Calcium: 9 mg/dL (ref 8.9–10.3)
Chloride: 99 mmol/L (ref 98–111)
Creatinine: 0.69 mg/dL (ref 0.44–1.00)
GFR, Estimated: >60 mL/min (ref >60)
Glucose, Bld: 91 mg/dL (ref 70–99)
Potassium: 4.3 mmol/L (ref 3.5–5.1)
Sodium: 136 mmol/L (ref 135–145)

## 2021-03-17 LAB — MAGNESIUM: Magnesium: 2 mg/dL (ref 1.7–2.4)

## 2021-03-17 MED ORDER — SODIUM CHLORIDE 0.9 % IV SOLN: Freq: Once | INTRAVENOUS | Status: AC

## 2021-03-17 MED ORDER — POTASSIUM CHLORIDE IN NACL 20-0.9 MEQ/L-% IV SOLN
INTRAVENOUS | Status: DC
  Filled 2021-03-17 (×2): qty 1000

## 2021-03-17 MED ORDER — CISPLATIN CHEMO INJECTION 100MG/100ML
40.0000 mg/m2 | Freq: Once | INTRAVENOUS | Status: AC
  Administered 2021-03-17: 74 mg via INTRAVENOUS
  Filled 2021-03-17: qty 74

## 2021-03-17 MED ORDER — SODIUM CHLORIDE 0.9 % IV SOLN
10.0000 mg | Freq: Once | INTRAVENOUS | Status: AC
  Administered 2021-03-17: 10 mg via INTRAVENOUS
  Filled 2021-03-17: qty 10

## 2021-03-17 MED ORDER — HEPARIN SOD (PORK) LOCK FLUSH 100 UNIT/ML IV SOLN
500.0000 [IU] | Freq: Once | INTRAVENOUS | Status: AC | PRN
  Administered 2021-03-17: 500 [IU]

## 2021-03-17 MED ORDER — MAGNESIUM SULFATE 2 GM/50ML IV SOLN
2.0000 g | Freq: Once | INTRAVENOUS | Status: AC
  Administered 2021-03-17: 2 g via INTRAVENOUS
  Filled 2021-03-17: qty 50

## 2021-03-17 MED ORDER — ACETAMINOPHEN 325 MG PO TABS
650.0000 mg | ORAL_TABLET | Freq: Four times a day (QID) | ORAL | Status: DC | PRN
  Administered 2021-03-17: 650 mg via ORAL
  Filled 2021-03-17: qty 2

## 2021-03-17 MED ORDER — SODIUM CHLORIDE 0.9% FLUSH
10.0000 mL | Freq: Once | INTRAVENOUS | Status: AC
  Administered 2021-03-17: 10 mL

## 2021-03-17 MED ORDER — PALONOSETRON HCL INJECTION 0.25 MG/5ML
0.2500 mg | Freq: Once | INTRAVENOUS | Status: AC
  Administered 2021-03-17: 0.25 mg via INTRAVENOUS
  Filled 2021-03-17: qty 5

## 2021-03-17 MED ORDER — FOSAPREPITANT DIMEGLUMINE INJECTION 150 MG
150.0000 mg | Freq: Once | INTRAVENOUS | Status: AC
  Administered 2021-03-17: 150 mg via INTRAVENOUS
  Filled 2021-03-17: qty 150

## 2021-03-17 MED ORDER — SODIUM CHLORIDE 0.9% FLUSH
10.0000 mL | INTRAVENOUS | Status: DC | PRN
Start: 2021-03-17 — End: 2021-03-17
  Administered 2021-03-17: 10 mL

## 2021-03-17 NOTE — Progress Notes (Signed)
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  Her pathologic staging is pT3 N3B. Given positive margins and presence of extracapsular extension, we agreed with adjuvant chemoradiation. She started radiation on 03/08/2021 and received first cycle of chemotherapy on 03/10/2021.  Review of systems after first cycle of chemotherapy pertinent for nausea and vomiting, well controlled with Compazine. Physical examination otherwise unremarkable, graft site well-healed, G-tube site appears clean.  No concern for infection.  No mucositis. Will continue chemotherapy as planned at current dose.  Return to clinic in 1 week. 2.  Unintentional weight loss, lost about 2 pounds since last visit.  Feedings mostly through G-tube, uses 4 cans of Osmolite and some as tolerated soft diet. 3.  Chemotherapy-induced nausea, mild, instructed to use as needed Compazine/Zofran and dexamethasone as instructed. She expressed understanding of the recommendations.  HISTORY OF PRESENTING ILLNESS:  Tammy Brady 70 y.o. female is here because of  SCC oral mucosa  This is a pleasant 70 year old female patient who first presented to her dentist with a painful lesion to her gums noticed more like at the end of 2021.  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  Subsequently she was referred to Dr. Amada Jupiter on December 14, 2020 who  discussed surgical intervention with patient.  She had neck CT which showed an enhancing soft tissue lesion along the buccal margin of left mandible with a lucency in the adjacent bone consistent with known SCC.  She was also found to have necrotic left level 1A/PN2/3B lymph nodes noted to be concerning for nodal metastasis.  She underwent resection on January 18, 2021 which revealed 3.3 cm tumor, grade 2 squamous cell carcinoma of the alveolar ridge extending to buccal gingival junction and lingual sulcus, depth of invasion 11 mm, negative margins, 6 out of 49 lymph nodes positive bilaterally with extracapsular extension, no perineural invasion or lymphovascular invasion.  She is now referred to medical oncology for consideration of adjuvant chemoradiation.  PATHOLOGY REPORT: Diagnosis     A: Mouth, RMT mucosal margin, excision - Benign squamous mucosa and submucosa with no tumor seen.   B: Mouth, left buccal margin, excision - Benign squamous mucosa and submucosa with no tumor seen.   C: Mouth, labial mucosal margin, excision - Benign squamous mucosa and submucosa with no tumor seen.   D: Mouth, anterior gingival mucosal margin, excision - Benign squamous mucosa and submucosa with no tumor seen.   E: Mouth, floor of mouth, biopsy - Benign squamous mucosa and submucosa with no tumor seen.   F: Tongue, ventral tongue, biopsy - Benign squamous mucosa and submucosa with no tumor seen.   G: Bone marrow, anterior mandible, biopsy - Fragment of blood clot, no tumor seen, on original frozen section only, tissue has been cut through on permanent sections.   H: Bone marrow, posterior mandible, biopsy - Fragment of blood clot, no tumor seen, on original frozen section only, tissue  has been cut through on permanent sections.   I: Lymph node, left neck level 1B and level 1A, lymphadenectomy - Metastatic squamous cell carcinoma with cystic change, involving 2 of 4 lymph nodes (2/4), size of largest  metastasis 2.2 cm. - Benign submandibular gland is also present.   J: Lymph node, right level 1B, lymphadenectomy - Metastatic squamous cell carcinoma involving 2 of 4 lymph nodes (2/4), with cystic change, size of largest metastasis 2.0 cm in greatest dimension, with extranodal extension of tumor identified. - Submandibular gland present with no tumor seen.   K: Lymph node, left EJ, excision - No tumor seen in three lymph nodes (0/3).   L: Lymph node, left neck, level 2, lymphadenectomy - No tumor seen in 14 lymph nodes (0/14).   M: Lymph node, left neck, level 3, lymphadenectomy - Metastatic squamous cell carcinoma involving 1 of 7 lymph nodes (1/7), size of metastasis 2.2 cm, with extranodal extension of tumor identified.   N: Lymph node, left neck, level 4, lymphadenectomy - Metastatic squamous cell carcinoma involving 1 of 2 lymph nodes (1/2), size of metastasis 1 mm.   O: Lymph node, right neck, level 2, lymphadenectomy - No tumor seen in eight lymph nodes (0/8).   P: Lymph node, right neck, level 3, lymphadenectomy - No tumor seen in four lymph nodes (0/4).   Q: Lymph node, right neck, level 4, lymphadenectomy - No tumor seen in three lymph nodes (0/3).   R: Left composite mandibular resection (segmental mandibulectomy and resection of submental gland) - Squamous cell carcinoma, moderately differentiated, of alveolar ridge extending to buccal gingival junction and lingual sulcus. - Tumor size 3.3 cm in greatest lateral dimension, depth of invasion 1.1 cm. - Tumor involves the alveolar bone and does not exceed the depth of the roots of the teeth, and does not extend into the body of the mandible.  - Surgical margins free of tumor (soft tissue and bone margins all negative for tumor) - Submental gland present with no tumor seen.   S: Teeth, extraction - Multiple intact and fragmented teeth (gross examination only).     Given positive margin and extracapsular extension, we  have discussed about adjuvant chemoradiation with weekly cisplatin.  Started weekly cisplatin on 03/10/2021.  Interval history She is here for follow-up prior to cycle 2 of chemotherapy.   Unfortunately after her last visit, she had nausea and vomiting immediately after her first cycle.   She started taking some as needed Compazine which has helped the nausea. Since that episode of nausea and vomiting, she did not have any further episodes.  She had felt queasy a couple times but this did not result in vomiting.  She denies any change in her tinnitus or hearing.  No constipation or change in bowel habits.  She has been drinking plenty of water, eats some meals by mouth but uses G-tube mostly for feeding, has been using 4 cans of Osmolite today. Rest of the pertinent 10 point ROS reviewed and negative.   MEDICAL HISTORY:  Past Medical History:  Diagnosis Date   CAD (coronary artery disease)    Mild non-obstructive disease by cath January 2011   Carotid artery disease ([place])    Followed by Dr Kellie Simmering   GERD (gastroesophageal reflux disease)    Hyperlipidemia    Hypertension    Hypothyroidism     SURGICAL HISTORY: Past Surgical History:  Procedure Laterality Date   APPENDECTOMY     IR IMAGING GUIDED PORT INSERTION  03/02/2021  SOCIAL HISTORY: Social History   Socioeconomic History   Marital status: Married    Spouse name: Not on file   Number of children: Not on file   Years of education: Not on file   Highest education level: Not on file  Occupational History   Occupation: Dealer    Employer: [employer]  Tobacco Use   Smoking status: Former    Packs/day: 1.00    Years: 40.00    Pack years: 40.00    Types: Cigarettes    Quit date: 05/11/2009    Years since quitting: 11.8   Smokeless tobacco: Never  Substance and Sexual Activity   Alcohol use: Not Currently   Drug use: No   Sexual activity: Not Currently  Other Topics Concern   Not on file  Social History  Narrative   Not on file   Social Determinants of Health   Financial Resource Strain: Low Risk    Difficulty of Paying Living Expenses: Not hard at all  Food Insecurity: No Food Insecurity   Worried About Charity fundraiser in the Last Year: Never true   [place] in the Last Year: Never true  Transportation Needs: No Transportation Needs   Lack of Transportation (Medical): No   Lack of Transportation (Non-Medical): No  Physical Activity: Not on file  Stress: No Stress Concern Present   Feeling of Stress : Only a little  Social Connections: Engineer, building services of Communication with Friends and Family: More than three times a week   Frequency of Social Gatherings with Friends and Family: More than three times a week   Attends Religious Services: More than 4 times per year   Active Member of [employer] or Organizations: Yes   Attends Music therapist: More than 4 times per year   Marital Status: Married  Human resources officer Violence: Not on file    FAMILY HISTORY: Family History  Problem Relation Age of Onset   Cancer Mother    Coronary artery disease Father    Coronary artery disease Maternal Grandmother     ALLERGIES:  is allergic to codeine.  MEDICATIONS:  Current Outpatient Medications  Medication Sig Dispense Refill   acetaminophen (TYLENOL) 500 MG tablet Take 500 mg by mouth every 6 (six) hours as needed.     amoxicillin-clavulanate (AUGMENTIN) 875-125 MG tablet Take 1 tablet by mouth 2 (two) times daily.     aspirin 325 MG tablet Take 650 mg by mouth as needed for mild pain or moderate pain.     calcium carbonate (OS-CAL) 600 MG TABS Take 600 mg by mouth daily.     dexamethasone (DECADRON) 4 MG tablet Take 2 tablets (8 mg total) by mouth daily. Take daily x 3 days starting the day after cisplatin chemotherapy. Take with food. 30 tablet 1   diltiazem (CARDIZEM CD) 360 MG 24 hr capsule TAKE 1 CAPSULE(360 MG) BY MOUTH DAILY 90 capsule 0    gabapentin (NEURONTIN) 300 MG capsule Take 300 mg by mouth 3 (three) times daily.     levothyroxine (SYNTHROID) 112 MCG tablet TAKE 1 TABLET BY MOUTH DAILY 90 tablet 0   lidocaine-prilocaine (EMLA) cream Apply to affected area once 30 g 3   Multiple Vitamin (MULTIVITAMIN) tablet Take 1 tablet by mouth daily.     nitroGLYCERIN (NITROSTAT) 0.4 MG SL tablet Place 1 tablet (0.4 mg total) under the tongue every 5 (five) minutes as needed. 25 tablet 6   Nutritional Supplements (FEEDING SUPPLEMENT,  OSMOLITE 1.5 CAL,) LIQD Place into feeding tube daily. 4-5 cartons daily     ondansetron (ZOFRAN) 8 MG tablet Take 1 tablet (8 mg total) by mouth 2 (two) times daily as needed. Start on the third day after cisplatin chemotherapy. 30 tablet 1   oxyCODONE (ROXICODONE) 5 MG/5ML solution Take 5 mg by mouth every 6 (six) hours as needed.     PERIOGARD 0.12 % solution 30 mLs by Mouth Rinse route daily.     Polyethylene Glycol 3350 (MIRALAX PO) Take by mouth as needed.     prochlorperazine (COMPAZINE) 10 MG tablet Take 1 tablet (10 mg total) by mouth every 6 (six) hours as needed (Nausea or vomiting). 30 tablet 1   simvastatin (ZOCOR) 40 MG tablet TAKE 1 TABLET(40 MG) BY MOUTH AT BEDTIME 90 tablet 0   traZODone (DESYREL) 50 MG tablet Take 1 tablet (50 mg total) by mouth at bedtime. 90 tablet 1   No current facility-administered medications for this visit.   Facility-Administered Medications Ordered in Other Visits  Medication Dose Route Frequency Provider Last Rate Last Admin   0.9 % NaCl with KCl 20 mEq/ L  infusion   Intravenous Continuous Kayden Hutmacher, MD 250 mL/hr at 03/17/21 1028 New Bag at 03/17/21 1028   acetaminophen (TYLENOL) tablet 650 mg  650 mg Oral Q6H PRN Benay Pike, MD   650 mg at 03/17/21 1031   CISplatin (PLATINOL) 74 mg in sodium chloride 0.9 % 250 mL chemo infusion  40 mg/m2 (Treatment Plan Recorded) Intravenous Once Deloros Beretta, Arletha Pili, MD       dexamethasone (DECADRON) 10 mg in sodium  chloride 0.9 % 50 mL IVPB  10 mg Intravenous Once Harjit Douds, MD       fosaprepitant (EMEND) 150 mg in sodium chloride 0.9 % 145 mL IVPB  150 mg Intravenous Once Eryx Zane, MD       heparin lock flush 100 unit/mL  500 Units Intracatheter Once PRN Coltyn Hanning, MD       magnesium sulfate IVPB 2 g 50 mL  2 g Intravenous Once Floree Zuniga, MD 50 mL/hr at 03/17/21 1029 2 g at 03/17/21 1029   palonosetron (ALOXI) injection 0.25 mg  0.25 mg Intravenous Once Naythan Douthit, MD       sodium chloride flush (NS) 0.9 % injection 10 mL  10 mL Intracatheter PRN Frederik Standley, Arletha Pili, MD        PHYSICAL EXAMINATION:  ECOG PERFORMANCE STATUS: 0 - Asymptomatic  Vitals:   03/17/21 0922  BP: (!) 139/56  Pulse: 66  Resp: 18  Temp: 97.8 F (36.6 C)  SpO2: 94%    Filed Weights   03/17/21 0922  Weight: 161 lb 7 oz (73.2 kg)    Physical Exam Constitutional:      Appearance: Normal appearance.  HENT:     Head: Normocephalic and atraumatic.  Cardiovascular:     Rate and Rhythm: Normal rate and regular rhythm.     Pulses: Normal pulses.     Heart sounds: Normal heart sounds.  Pulmonary:     Effort: Pulmonary effort is normal.     Breath sounds: Normal breath sounds.  Abdominal:     General: Abdomen is flat.     Palpations: Abdomen is soft.     Comments: G-tube appears well.  Musculoskeletal:        General: No swelling or tenderness.     Cervical back: Normal range of motion and neck supple. No rigidity.  Lymphadenopathy:  Cervical: No cervical adenopathy.  Skin:    General: Skin is warm and dry.     Comments: Graft site on the leg looks well-healed.  Neurological:     General: No focal deficit present.     Mental Status: She is alert.  Psychiatric:        Mood and Affect: Mood normal.    LABORATORY DATA:  I have reviewed the data as listed Lab Results  Component Value Date   WBC 10.6 (H) 03/17/2021   HGB 11.5 (L) 03/17/2021   HCT 34.3 (L) 03/17/2021   MCV 87.1  03/17/2021   PLT 284 03/17/2021     Chemistry      Component Value Date/Time   NA 136 03/17/2021 0843   NA 142 12/08/2020 1356   K 4.3 03/17/2021 0843   CL 99 03/17/2021 0843   CO2 26 03/17/2021 0843   BUN 18 03/17/2021 0843   BUN 15 12/08/2020 1356   CREATININE 0.69 03/17/2021 0843      Component Value Date/Time   CALCIUM 9.0 03/17/2021 0843   ALKPHOS 84 12/08/2020 1356   AST 39 12/08/2020 1356   ALT 24 12/08/2020 1356   BILITOT <0.2 12/08/2020 1356     Have reviewed pertinent pathology report.  RADIOGRAPHIC STUDIES: I have personally reviewed the radiological images as listed and agreed with the findings in the report. IR IMAGING GUIDED PORT INSERTION  Result Date: 03/02/2021 CLINICAL DATA:  Squamous cell carcinoma of the oral mucosa EXAM: TUNNELED PORT CATHETER PLACEMENT WITH ULTRASOUND AND FLUOROSCOPIC GUIDANCE FLUOROSCOPY TIME:  12 seconds; 1 mGy ANESTHESIA/SEDATION: Intravenous Fentanyl 147mg and Versed '2mg'$  were administered as conscious sedation during continuous monitoring of the patient's level of consciousness and physiological / cardiorespiratory status by the radiology RN, with a total moderate sedation time of 19 minutes. TECHNIQUE: The procedure, risks, benefits, and alternatives were explained to the patient. Questions regarding the procedure were encouraged and answered. The patient understands and consents to the procedure. Patency of the right IJ vein was confirmed with ultrasound with image documentation. An appropriate skin site was determined. Skin site was marked. Region was prepped using maximum barrier technique including cap and mask, sterile gown, sterile gloves, large sterile sheet, and Chlorhexidine as cutaneous antisepsis. The region was infiltrated locally with 1% lidocaine. Under real-time ultrasound guidance, the right IJ vein was accessed with a 21 gauge micropuncture needle; the needle tip within the vein was confirmed with ultrasound image  documentation. Needle was exchanged over a 018 guidewire for transitional dilator, and vascular measurement was performed. A small incision was made on the right anterior chest wall and a subcutaneous pocket fashioned. The power-injectable port was positioned and its catheter tunneled to the right IJ dermatotomy site. The transitional dilator was exchanged over an Amplatz wire for a peel-away sheath, through which the port catheter, which had been trimmed to the appropriate length, was advanced and positioned under fluoroscopy with its tip at the cavoatrial junction. Spot chest radiograph confirms good catheter position and no pneumothorax. The port was flushed per protocol. The pocket was closed with deep interrupted and subcuticular continuous 3-0 Monocryl sutures. The incisions were covered with Dermabond then covered with a sterile dressing. The patient tolerated the procedure well. COMPLICATIONS: COMPLICATIONS None immediate IMPRESSION: Technically successful right IJ power-injectable port catheter placement. Ready for routine use. Electronically Signed   By: DLucrezia EuropeM.D.   On: 03/02/2021 15:09    All questions were answered. The patient knows to call the clinic  with any problems, questions or concerns.     Benay Pike, MD 03/17/2021 10:44 AM

## 2021-03-17 NOTE — Patient Instructions (Signed)
[facility] MEDICAL ONCOLOGY  Discharge Instructions: Thank you for choosing [facility] to provide your oncology and hematology care.   If you have a lab appointment with the Cancer Center, please go directly to the Cancer Center and check in at the registration area.   Wear comfortable clothing and clothing appropriate for easy access to any Portacath or PICC line.   We strive to give you quality time with your provider. You may need to reschedule your appointment if you arrive late (15 or more minutes).  Arriving late affects you and other patients whose appointments are after yours.  Also, if you miss three or more appointments without notifying the office, you may be dismissed from the clinic at the provider's discretion.      For prescription refill requests, have your pharmacy contact our office and allow 72 hours for refills to be completed.    Today you received the following chemotherapy and/or immunotherapy agents : Cisplatin    To help prevent nausea and vomiting after your treatment, we encourage you to take your nausea medication as directed.  BELOW ARE SYMPTOMS THAT SHOULD BE REPORTED IMMEDIATELY: *FEVER GREATER THAN 100.4 F (38 C) OR HIGHER *CHILLS OR SWEATING *NAUSEA AND VOMITING THAT IS NOT CONTROLLED WITH YOUR NAUSEA MEDICATION *UNUSUAL SHORTNESS OF BREATH *UNUSUAL BRUISING OR BLEEDING *URINARY PROBLEMS (pain or burning when urinating, or frequent urination) *BOWEL PROBLEMS (unusual diarrhea, constipation, pain near the anus) TENDERNESS IN MOUTH AND THROAT WITH OR WITHOUT PRESENCE OF ULCERS (sore throat, sores in mouth, or a toothache) UNUSUAL RASH, SWELLING OR PAIN  UNUSUAL VAGINAL DISCHARGE OR ITCHING   Items with * indicate a potential emergency and should be followed up as soon as possible or go to the Emergency Department if any problems should occur.  Please show the CHEMOTHERAPY ALERT CARD or IMMUNOTHERAPY ALERT CARD at check-in to  the Emergency Department and triage nurse.  Should you have questions after your visit or need to cancel or reschedule your appointment, please contact [facility] MEDICAL [facility]COLOG[facility]  Dept: [phone]  and follow the prompts.  Office hours are 8:00 a.m. to 4:30 p.m. Monday - Friday. Please note that voicemails left after 4:00 p.m. may not be returned until the following business day.  We are closed weekends and major holidays. You have access to a nurse at all times for urgent questions. Please call the main number to the clinic Dept: [phone] and follow the prompts.   For any non-urgent questions, you may also contact your provider using MyChart. We now offer e-Visits for anyone 18 and older to request care online for non-urgent symptoms. For details visit [email or web address].   Also download the MyChart app! Go to the app store, search "MyChart", open the app, select [facility], and log in with your MyChart username and password.  Due to Covid, a mask is required upon entering the hospital/clinic. If you do not have a mask, one will be given to you upon arrival. For doctor visits, patients may have 1 support person aged 18 or older with them. For treatment visits, patients cannot have anyone with them due to current Covid guidelines and our immunocompromised population.   

## 2021-03-17 NOTE — Progress Notes (Signed)
Nutrition Follow-up:  Patient receiving concurrent adjuvant chemoradiation therapy with weekly cisplatin for SCC of oral cavity. She is s/p G-tube [facility] at [facility] 8/5.  Met with patient during infusion. She reports nausea with episode of vomiting after first infusion during the car ride home. She reports feeling better after a couple of hours. Patient reports taking her nausea medications as prescribed, she reports no further nausea. She continues to eat soft foods by mouth. Patient reports family came into town for the holiday weekend and enjoyed a variety of foods (hamburger, mac and cheese casserole, stir fry vegetables) She continues to give 4 cartons Osmolite 1.5 via PEG daily. Patient reports receiving shipment of formula yesterday, but did not receive syringes or guaze. Patient reports this should be delivered today.   Medications: zofran, compazine  Labs: reviewed  Anthropometrics: Weight 161 lb 7 oz today decreased from 164 lb on 8/30 increased from 160 lb on 8/16   Estimated Energy Needs  Kcals: 2230-2455 Protein: 112-134 Fluid: 2.3 L  NUTRITION DIAGNOSIS: Inadequate oral intake ongoing   INTERVENTION:  Continue eating soft, moist high protein foods as tolerated Encouraged drinking oral nutrition supplement for added calories and protein - pt tried [employer] and did not like it, she has not tried the Ensure Complete  Continue 1 carton Osmolite 1.5 QID with 90 ml water flush before and after each feeding. Patient understands that she will increase tube feedings if weight decreases and unable to tolerate oral nutrition Patient has contact information    MONITORING, EVALUATION, GOAL: weight trends, intake, tube feeding   NEXT VISIT: Wednesday September 14 in infusion

## 2021-03-18 ENCOUNTER — Other Ambulatory Visit: Payer: Self-pay

## 2021-03-18 ENCOUNTER — Ambulatory Visit
Admission: RE | Admit: 2021-03-18 | Discharge: 2021-03-18 | Disposition: A | Discharge status: Home / Self Care | Payer: Medicare Other | Source: Ambulatory Visit | Attending: Radiation Oncology | Admitting: Radiation Oncology

## 2021-03-18 DIAGNOSIS — C411 Malignant neoplasm of mandible: Secondary | ICD-10-CM | POA: Diagnosis not present

## 2021-03-18 DIAGNOSIS — C060 Malignant neoplasm of cheek mucosa: Secondary | ICD-10-CM | POA: Diagnosis not present

## 2021-03-19 ENCOUNTER — Ambulatory Visit
Admission: RE | Admit: 2021-03-19 | Discharge: 2021-03-19 | Disposition: A | Discharge status: Home / Self Care | Payer: Medicare Other | Source: Ambulatory Visit | Attending: Radiation Oncology | Admitting: Radiation Oncology

## 2021-03-19 DIAGNOSIS — C060 Malignant neoplasm of cheek mucosa: Secondary | ICD-10-CM | POA: Diagnosis not present

## 2021-03-19 DIAGNOSIS — C411 Malignant neoplasm of mandible: Secondary | ICD-10-CM | POA: Diagnosis not present

## 2021-03-20 ENCOUNTER — Telehealth: Payer: Self-pay

## 2021-03-20 NOTE — Telephone Encounter (Signed)
Called patient to schedule AWV, Patient denied visit at this time, she is doing radiation therapy at this time and will call when she is able to do AWV.

## 2021-03-22 ENCOUNTER — Ambulatory Visit
Admission: RE | Admit: 2021-03-22 | Discharge: 2021-03-22 | Disposition: A | Discharge status: Home / Self Care | Payer: Medicare Other | Source: Ambulatory Visit | Attending: Radiation Oncology | Admitting: Radiation Oncology

## 2021-03-22 ENCOUNTER — Other Ambulatory Visit: Payer: Self-pay

## 2021-03-22 ENCOUNTER — Other Ambulatory Visit: Payer: Self-pay | Admitting: Radiation Oncology

## 2021-03-22 DIAGNOSIS — C060 Malignant neoplasm of cheek mucosa: Secondary | ICD-10-CM | POA: Diagnosis not present

## 2021-03-22 DIAGNOSIS — C031 Malignant neoplasm of lower gum: Secondary | ICD-10-CM

## 2021-03-22 DIAGNOSIS — C411 Malignant neoplasm of mandible: Secondary | ICD-10-CM | POA: Diagnosis not present

## 2021-03-22 MED ORDER — LIDOCAINE VISCOUS HCL 2 % MT SOLN
OROMUCOSAL | 4 refills | Status: DC
Start: 2021-03-22 — End: 2021-05-19

## 2021-03-23 ENCOUNTER — Inpatient Hospital Stay: Payer: Medicare Other

## 2021-03-23 ENCOUNTER — Ambulatory Visit
Admission: RE | Admit: 2021-03-23 | Discharge: 2021-03-23 | Disposition: A | Discharge status: Home / Self Care | Payer: Medicare Other | Source: Ambulatory Visit | Attending: Radiation Oncology | Admitting: Radiation Oncology

## 2021-03-23 DIAGNOSIS — T451X5A Adverse effect of antineoplastic and immunosuppressive drugs, initial encounter: Secondary | ICD-10-CM | POA: Diagnosis not present

## 2021-03-23 DIAGNOSIS — K5903 Drug induced constipation: Secondary | ICD-10-CM | POA: Diagnosis not present

## 2021-03-23 DIAGNOSIS — D696 Thrombocytopenia, unspecified: Secondary | ICD-10-CM | POA: Diagnosis not present

## 2021-03-23 DIAGNOSIS — D649 Anemia, unspecified: Secondary | ICD-10-CM | POA: Diagnosis not present

## 2021-03-23 DIAGNOSIS — C060 Malignant neoplasm of cheek mucosa: Secondary | ICD-10-CM | POA: Diagnosis not present

## 2021-03-23 DIAGNOSIS — C411 Malignant neoplasm of mandible: Secondary | ICD-10-CM | POA: Diagnosis not present

## 2021-03-23 DIAGNOSIS — Z87891 Personal history of nicotine dependence: Secondary | ICD-10-CM | POA: Diagnosis not present

## 2021-03-23 DIAGNOSIS — R112 Nausea with vomiting, unspecified: Secondary | ICD-10-CM | POA: Diagnosis not present

## 2021-03-23 DIAGNOSIS — D701 Agranulocytosis secondary to cancer chemotherapy: Secondary | ICD-10-CM | POA: Diagnosis not present

## 2021-03-23 DIAGNOSIS — K1231 Oral mucositis (ulcerative) due to antineoplastic therapy: Secondary | ICD-10-CM | POA: Diagnosis not present

## 2021-03-23 DIAGNOSIS — R634 Abnormal weight loss: Secondary | ICD-10-CM | POA: Diagnosis not present

## 2021-03-23 DIAGNOSIS — Z5111 Encounter for antineoplastic chemotherapy: Secondary | ICD-10-CM | POA: Diagnosis not present

## 2021-03-23 LAB — BASIC METABOLIC PANEL - CANCER CENTER ONLY
Anion gap: 11 (ref 5–15)
BUN: 20 mg/dL (ref 8–23)
CO2: 26 mmol/L (ref 22–32)
Calcium: 9.6 mg/dL (ref 8.9–10.3)
Chloride: 97 mmol/L — ABNORMAL LOW (ref 98–111)
Creatinine: 0.78 mg/dL (ref 0.44–1.00)
GFR, Estimated: >60 mL/min (ref >60)
Glucose, Bld: 90 mg/dL (ref 70–99)
Potassium: 4.2 mmol/L (ref 3.5–5.1)
Sodium: 134 mmol/L — ABNORMAL LOW (ref 135–145)

## 2021-03-23 LAB — CBC WITH DIFFERENTIAL (CANCER CENTER ONLY)
Abs Immature Granulocytes: 0.04 10*3/uL (ref 0.00–0.07)
Basophils Absolute: 0 10*3/uL (ref 0.0–0.1)
Basophils Relative: 0 %
Eosinophils Absolute: 0 10*3/uL (ref 0.0–0.5)
Eosinophils Relative: 0 %
HCT: 38.2 % (ref 36.0–46.0)
Hemoglobin: 12.5 g/dL (ref 12.0–15.0)
Immature Granulocytes: 0 %
Lymphocytes Relative: 10 %
Lymphs Abs: 1.1 10*3/uL (ref 0.7–4.0)
MCH: 28.5 pg (ref 26.0–34.0)
MCHC: 32.7 g/dL (ref 30.0–36.0)
MCV: 87.2 fL (ref 80.0–100.0)
Monocytes Absolute: 0.8 10*3/uL (ref 0.1–1.0)
Monocytes Relative: 7 %
Neutro Abs: 8.7 10*3/uL — ABNORMAL HIGH (ref 1.7–7.7)
Neutrophils Relative %: 83 %
Platelet Count: 187 10*3/uL (ref 150–400)
RBC: 4.38 MIL/uL (ref 3.87–5.11)
RDW: 14.6 % (ref 11.5–15.5)
WBC Count: 10.6 10*3/uL — ABNORMAL HIGH (ref 4.0–10.5)
nRBC: 0 % (ref 0.0–0.2)

## 2021-03-23 LAB — MAGNESIUM: Magnesium: 1.8 mg/dL (ref 1.7–2.4)

## 2021-03-23 NOTE — Progress Notes (Signed)
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  Her pathologic staging is pT3 N3B. Given positive margins and presence of extracapsular extension, we agreed with adjuvant chemoradiation. She started radiation on 03/08/2021 and received first cycle of chemotherapy on 03/10/2021.   Since last visit, she has been doing well.  She has noticed more soreness in her mouth however she is very reluctant to try any narcotics.  She tells me that when she comes off of them, she loses her mind.  She has been using Tylenol, Magic mouthwash and baking soda rinses.  Rest of the pertinent review of systems unremarkable except for nausea which is controlled with as needed antiemetics. Physical examination unremarkable, no palpable lymphadenopathy.  Reviewed labs from yesterday, satisfactory to proceed. Will continue chemotherapy as planned at current dose.  Return to clinic in 1 week.  2.  Unintentional weight loss, lost about 3 pounds since last visit.  She continues to use G-tube for most of her feedings, uses 4 cans of Osmolite.  She is able to eat soft foods such as oatmeal.  3.  Chemotherapy-induced nausea, has been needing Compazine 3 times for nausea every day.  She has not had any episodes of vomiting since last cycle.  4.  Constipation, likely drug-induced.  Can use milk of magnesia as needed for now.  HISTORY OF PRESENTING ILLNESS:  Tammy DEVEAUX 70  y.o. female is here because of  SCC oral mucosa  This is a pleasant 71 year old female patient who first presented to her dentist with a painful lesion to her gums noticed more like at the end of 2021.  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  Subsequently she was referred to Dr. Amada Jupiter on December 14, 2020 who discussed surgical intervention with patient.  She had neck CT which showed an enhancing soft tissue lesion along the buccal margin of left mandible with a lucency in the adjacent bone consistent with known SCC.  She was also found to have necrotic left level 1A/PN2/3B lymph nodes noted to be concerning for nodal metastasis.  She underwent resection on January 18, 2021 which revealed 3.3 cm tumor, grade 2 squamous cell carcinoma of the alveolar ridge extending to buccal gingival junction and lingual sulcus, depth of invasion 11 mm, negative margins, 6 out of 49 lymph nodes positive bilaterally with extracapsular extension, no perineural invasion or lymphovascular invasion.  She is now referred to medical oncology for consideration of adjuvant chemoradiation.  PATHOLOGY REPORT: Diagnosis     A: Mouth, RMT mucosal margin, excision - Benign squamous mucosa and submucosa with no tumor seen.   B: Mouth, left buccal margin, excision - Benign squamous mucosa and submucosa with no tumor seen.   C: Mouth, labial mucosal margin, excision - Benign squamous mucosa and submucosa with no tumor seen.   D: Mouth, anterior gingival mucosal margin, excision - Benign squamous mucosa and submucosa with no tumor seen.  E: Mouth, floor of mouth, biopsy - Benign squamous mucosa and submucosa with no tumor seen.   F: Tongue, ventral tongue, biopsy - Benign squamous mucosa and submucosa with no tumor seen.   G: Bone marrow, anterior mandible, biopsy - Fragment of blood clot, no tumor seen, on original frozen section only, tissue has been cut through on  permanent sections.   H: Bone marrow, posterior mandible, biopsy - Fragment of blood clot, no tumor seen, on original frozen section only, tissue has been cut through on permanent sections.   I: Lymph node, left neck level 1B and level 1A, lymphadenectomy - Metastatic squamous cell carcinoma with cystic change, involving 2 of 4 lymph nodes (2/4), size of largest metastasis 2.2 cm. - Benign submandibular gland is also present.   J: Lymph node, right level 1B, lymphadenectomy - Metastatic squamous cell carcinoma involving 2 of 4 lymph nodes (2/4), with cystic change, size of largest metastasis 2.0 cm in greatest dimension, with extranodal extension of tumor identified. - Submandibular gland present with no tumor seen.   K: Lymph node, left EJ, excision - No tumor seen in three lymph nodes (0/3).   L: Lymph node, left neck, level 2, lymphadenectomy - No tumor seen in 14 lymph nodes (0/14).   M: Lymph node, left neck, level 3, lymphadenectomy - Metastatic squamous cell carcinoma involving 1 of 7 lymph nodes (1/7), size of metastasis 2.2 cm, with extranodal extension of tumor identified.   N: Lymph node, left neck, level 4, lymphadenectomy - Metastatic squamous cell carcinoma involving 1 of 2 lymph nodes (1/2), size of metastasis 1 mm.   O: Lymph node, right neck, level 2, lymphadenectomy - No tumor seen in eight lymph nodes (0/8).   P: Lymph node, right neck, level 3, lymphadenectomy - No tumor seen in four lymph nodes (0/4).   Q: Lymph node, right neck, level 4, lymphadenectomy - No tumor seen in three lymph nodes (0/3).   R: Left composite mandibular resection (segmental mandibulectomy and resection of submental gland) - Squamous cell carcinoma, moderately differentiated, of alveolar ridge extending to buccal gingival junction and lingual sulcus. - Tumor size 3.3 cm in greatest lateral dimension, depth of invasion 1.1 cm. - Tumor involves the alveolar bone and does not exceed the  depth of the roots of the teeth, and does not extend into the body of the mandible.  - Surgical margins free of tumor (soft tissue and bone margins all negative for tumor) - Submental gland present with no tumor seen.   S: Teeth, extraction - Multiple intact and fragmented teeth (gross examination only).     Given positive margin and extracapsular extension, we have discussed about adjuvant chemoradiation with weekly cisplatin.  Started weekly cisplatin on 03/10/2021.  Interval history She is here for follow-up prior to cycle 3 of chemotherapy.   She has noticed mouth sores but very reluctant to try narcotics. She is using tylenol, magic mouth wash. Nausea, takes compazine TID PRN She lost about 3 lbs since last visit, can eat soft foods, most of the nourishment went through G tube. Constipation used milk of mag. No hearing loss, neuropathy  MEDICAL HISTORY:  Past Medical History:  Diagnosis Date   CAD (coronary artery disease)    Mild non-obstructive disease by cath January 2011   Carotid artery disease (HCC)    Followed by Dr Kellie Simmering   GERD (gastroesophageal reflux disease)    Hyperlipidemia    Hypertension    Hypothyroidism     SURGICAL  HISTORY: Past Surgical History:  Procedure Laterality Date   APPENDECTOMY     IR IMAGING GUIDED PORT INSERTION  03/02/2021    SOCIAL HISTORY: Social History   Socioeconomic History   Marital status: Married    Spouse name: Not on file   Number of children: Not on file   Years of education: Not on file   Highest education level: Not on file  Occupational History   Occupation: Dealer    Employer: [employer]  Tobacco Use   Smoking status: Former    Packs/day: 1.00    Years: 40.00    Pack years: 40.00    Types: Cigarettes    Quit date: 05/11/2009    Years since quitting: 11.8   Smokeless tobacco: Never  Substance and Sexual Activity   Alcohol use: Not Currently   Drug use: No   Sexual activity: Not Currently  Other  Topics Concern   Not on file  Social History Narrative   Not on file   Social Determinants of Health   Financial Resource Strain: Low Risk    Difficulty of Paying Living Expenses: Not hard at all  Food Insecurity: No Food Insecurity   Worried About Charity fundraiser in the Last Year: Never true   [place] in the Last Year: Never true  Transportation Needs: No Transportation Needs   Lack of Transportation (Medical): No   Lack of Transportation (Non-Medical): No  Physical Activity: Not on file  Stress: No Stress Concern Present   Feeling of Stress : Only a little  Social Connections: Engineer, building services of Communication with Friends and Family: More than three times a week   Frequency of Social Gatherings with Friends and Family: More than three times a week   Attends Religious Services: More than 4 times per year   Active Member of [employer] or Organizations: Yes   Attends Music therapist: More than 4 times per year   Marital Status: Married  Human resources officer Violence: Not on file    FAMILY HISTORY: Family History  Problem Relation Age of Onset   Cancer Mother    Coronary artery disease Father    Coronary artery disease Maternal Grandmother     ALLERGIES:  is allergic to codeine.  MEDICATIONS:  Current Outpatient Medications  Medication Sig Dispense Refill   acetaminophen (TYLENOL) 500 MG tablet Take 500 mg by mouth every 6 (six) hours as needed.     aspirin 325 MG tablet Take 650 mg by mouth as needed for mild pain or moderate pain.     calcium carbonate (OS-CAL) 600 MG TABS Take 600 mg by mouth daily.     dexamethasone (DECADRON) 4 MG tablet Take 2 tablets (8 mg total) by mouth daily. Take daily x 3 days starting the day after cisplatin chemotherapy. Take with food. 30 tablet 1   diltiazem (CARDIZEM CD) 360 MG 24 hr capsule TAKE 1 CAPSULE(360 MG) BY MOUTH DAILY 90 capsule 0   gabapentin (NEURONTIN) 300 MG capsule Take 300 mg by mouth 3  (three) times daily.     levothyroxine (SYNTHROID) 112 MCG tablet TAKE 1 TABLET BY MOUTH DAILY 90 tablet 0   lidocaine (XYLOCAINE) 2 % solution Patient: Mix 1part 2% viscous lidocaine, 1part H20. Swish & swallow 22m of diluted mixture, 329m before meals and at bedtime, up to QID 200 mL 4   lidocaine-prilocaine (EMLA) cream Apply to affected area once 30 g 3   Multiple Vitamin (MULTIVITAMIN)  tablet Take 1 tablet by mouth daily.     nitroGLYCERIN (NITROSTAT) 0.4 MG SL tablet Place 1 tablet (0.4 mg total) under the tongue every 5 (five) minutes as needed. 25 tablet 6   Nutritional Supplements (FEEDING SUPPLEMENT, OSMOLITE 1.5 CAL,) LIQD Place into feeding tube daily. 4-5 cartons daily     ondansetron (ZOFRAN) 8 MG tablet Take 1 tablet (8 mg total) by mouth 2 (two) times daily as needed. Start on the third day after cisplatin chemotherapy. 30 tablet 1   Polyethylene Glycol 3350 (MIRALAX PO) Take by mouth as needed.     prochlorperazine (COMPAZINE) 10 MG tablet Take 1 tablet (10 mg total) by mouth every 6 (six) hours as needed (Nausea or vomiting). 30 tablet 1   simvastatin (ZOCOR) 40 MG tablet TAKE 1 TABLET(40 MG) BY MOUTH AT BEDTIME 90 tablet 0   traZODone (DESYREL) 50 MG tablet Take 1 tablet (50 mg total) by mouth at bedtime. 90 tablet 1   No current facility-administered medications for this visit.    PHYSICAL EXAMINATION:  ECOG PERFORMANCE STATUS: 0 - Asymptomatic  Vitals:   03/24/21 0814  BP: (!) 142/70  Pulse: 71  Resp: 17  Temp: 98.4 F (36.9 C)  SpO2: 96%    Filed Weights   03/24/21 0814  Weight: 158 lb (71.7 kg)    Physical Exam Constitutional:      Appearance: Normal appearance.  HENT:     Head: Normocephalic and atraumatic.     Mouth/Throat:     Comments: No mucositis, slight erythema on the tongue noted. Cardiovascular:     Rate and Rhythm: Normal rate and regular rhythm.     Pulses: Normal pulses.     Heart sounds: Normal heart sounds.  Pulmonary:     Effort:  Pulmonary effort is normal.     Breath sounds: Normal breath sounds.  Abdominal:     General: Abdomen is flat.     Palpations: Abdomen is soft.     Comments: G-tube appears well.  Musculoskeletal:        General: No swelling or tenderness.     Cervical back: Normal range of motion and neck supple. No rigidity.  Lymphadenopathy:     Cervical: No cervical adenopathy.  Skin:    General: Skin is warm and dry.  Neurological:     General: No focal deficit present.     Mental Status: She is alert.  Psychiatric:        Mood and Affect: Mood normal.    LABORATORY DATA:  I have reviewed the data as listed Lab Results  Component Value Date   WBC 10.6 (H) 03/23/2021   HGB 12.5 03/23/2021   HCT 38.2 03/23/2021   MCV 87.2 03/23/2021   PLT 187 03/23/2021     Chemistry      Component Value Date/Time   NA 134 (L) 03/23/2021 1028   NA 142 12/08/2020 1356   K 4.2 03/23/2021 1028   CL 97 (L) 03/23/2021 1028   CO2 26 03/23/2021 1028   BUN 20 03/23/2021 1028   BUN 15 12/08/2020 1356   CREATININE 0.78 03/23/2021 1028      Component Value Date/Time   CALCIUM 9.6 03/23/2021 1028   ALKPHOS 84 12/08/2020 1356   AST 39 12/08/2020 1356   ALT 24 12/08/2020 1356   BILITOT <0.2 12/08/2020 1356     Have reviewed pertinent pathology report.  RADIOGRAPHIC STUDIES: I have personally reviewed the radiological images as listed and agreed with the  findings in the report. IR IMAGING GUIDED PORT INSERTION  Result Date: 03/02/2021 CLINICAL DATA:  Squamous cell carcinoma of the oral mucosa EXAM: TUNNELED PORT CATHETER PLACEMENT WITH ULTRASOUND AND FLUOROSCOPIC GUIDANCE FLUOROSCOPY TIME:  12 seconds; 1 mGy ANESTHESIA/SEDATION: Intravenous Fentanyl 148mg and Versed '2mg'$  were administered as conscious sedation during continuous monitoring of the patient's level of consciousness and physiological / cardiorespiratory status by the radiology RN, with a total moderate sedation time of 19 minutes. TECHNIQUE:  The procedure, risks, benefits, and alternatives were explained to the patient. Questions regarding the procedure were encouraged and answered. The patient understands and consents to the procedure. Patency of the right IJ vein was confirmed with ultrasound with image documentation. An appropriate skin site was determined. Skin site was marked. Region was prepped using maximum barrier technique including cap and mask, sterile gown, sterile gloves, large sterile sheet, and Chlorhexidine as cutaneous antisepsis. The region was infiltrated locally with 1% lidocaine. Under real-time ultrasound guidance, the right IJ vein was accessed with a 21 gauge micropuncture needle; the needle tip within the vein was confirmed with ultrasound image documentation. Needle was exchanged over a 018 guidewire for transitional dilator, and vascular measurement was performed. A small incision was made on the right anterior chest wall and a subcutaneous pocket fashioned. The power-injectable port was positioned and its catheter tunneled to the right IJ dermatotomy site. The transitional dilator was exchanged over an Amplatz wire for a peel-away sheath, through which the port catheter, which had been trimmed to the appropriate length, was advanced and positioned under fluoroscopy with its tip at the cavoatrial junction. Spot chest radiograph confirms good catheter position and no pneumothorax. The port was flushed per protocol. The pocket was closed with deep interrupted and subcuticular continuous 3-0 Monocryl sutures. The incisions were covered with Dermabond then covered with a sterile dressing. The patient tolerated the procedure well. COMPLICATIONS: COMPLICATIONS None immediate IMPRESSION: Technically successful right IJ power-injectable port catheter placement. Ready for routine use. Electronically Signed   By: DLucrezia EuropeM.D.   On: 03/02/2021 15:09    All questions were answered. The patient knows to call the clinic with any  problems, questions or concerns.     PBenay Pike MD 03/24/2021 8:47 AM

## 2021-03-24 ENCOUNTER — Other Ambulatory Visit: Payer: Self-pay

## 2021-03-24 ENCOUNTER — Inpatient Hospital Stay: Payer: Medicare Other | Admitting: Hematology and Oncology

## 2021-03-24 ENCOUNTER — Ambulatory Visit
Admission: RE | Admit: 2021-03-24 | Discharge: 2021-03-24 | Disposition: A | Discharge status: Home / Self Care | Payer: Medicare Other | Source: Ambulatory Visit | Attending: Radiation Oncology | Admitting: Radiation Oncology

## 2021-03-24 ENCOUNTER — Other Ambulatory Visit: Payer: Medicare Other

## 2021-03-24 ENCOUNTER — Inpatient Hospital Stay: Payer: Medicare Other | Admitting: Dietician

## 2021-03-24 ENCOUNTER — Inpatient Hospital Stay: Payer: Medicare Other

## 2021-03-24 ENCOUNTER — Encounter: Payer: Self-pay | Admitting: Hematology and Oncology

## 2021-03-24 VITALS — BP 142/70 | HR 71 | Temp 98.4°F | Resp 17 | Wt 158.0 lb

## 2021-03-24 DIAGNOSIS — C411 Malignant neoplasm of mandible: Secondary | ICD-10-CM | POA: Diagnosis not present

## 2021-03-24 DIAGNOSIS — Z87891 Personal history of nicotine dependence: Secondary | ICD-10-CM | POA: Diagnosis not present

## 2021-03-24 DIAGNOSIS — R634 Abnormal weight loss: Secondary | ICD-10-CM

## 2021-03-24 DIAGNOSIS — D649 Anemia, unspecified: Secondary | ICD-10-CM | POA: Diagnosis not present

## 2021-03-24 DIAGNOSIS — Z5111 Encounter for antineoplastic chemotherapy: Secondary | ICD-10-CM | POA: Diagnosis not present

## 2021-03-24 DIAGNOSIS — C060 Malignant neoplasm of cheek mucosa: Secondary | ICD-10-CM

## 2021-03-24 DIAGNOSIS — T451X5A Adverse effect of antineoplastic and immunosuppressive drugs, initial encounter: Secondary | ICD-10-CM | POA: Diagnosis not present

## 2021-03-24 DIAGNOSIS — R112 Nausea with vomiting, unspecified: Secondary | ICD-10-CM | POA: Diagnosis not present

## 2021-03-24 DIAGNOSIS — D696 Thrombocytopenia, unspecified: Secondary | ICD-10-CM | POA: Diagnosis not present

## 2021-03-24 DIAGNOSIS — K5903 Drug induced constipation: Secondary | ICD-10-CM | POA: Diagnosis not present

## 2021-03-24 DIAGNOSIS — D701 Agranulocytosis secondary to cancer chemotherapy: Secondary | ICD-10-CM | POA: Diagnosis not present

## 2021-03-24 DIAGNOSIS — K1231 Oral mucositis (ulcerative) due to antineoplastic therapy: Secondary | ICD-10-CM | POA: Diagnosis not present

## 2021-03-24 MED ORDER — SODIUM CHLORIDE 0.9% FLUSH
10.0000 mL | INTRAVENOUS | Status: DC | PRN
  Administered 2021-03-24: 10 mL

## 2021-03-24 MED ORDER — SODIUM CHLORIDE 0.9 % IV SOLN
40.0000 mg/m2 | Freq: Once | INTRAVENOUS | Status: AC
  Administered 2021-03-24: 74 mg via INTRAVENOUS
  Filled 2021-03-24: qty 74

## 2021-03-24 MED ORDER — MAGNESIUM SULFATE 2 GM/50ML IV SOLN
2.0000 g | Freq: Once | INTRAVENOUS | Status: AC
  Administered 2021-03-24: 2 g via INTRAVENOUS
  Filled 2021-03-24: qty 50

## 2021-03-24 MED ORDER — SODIUM CHLORIDE 0.9 % IV SOLN
150.0000 mg | Freq: Once | INTRAVENOUS | Status: AC
  Administered 2021-03-24: 150 mg via INTRAVENOUS
  Filled 2021-03-24: qty 150

## 2021-03-24 MED ORDER — PALONOSETRON HCL INJECTION 0.25 MG/5ML
0.2500 mg | Freq: Once | INTRAVENOUS | Status: AC
Start: 2021-03-24 — End: 2021-03-24
  Administered 2021-03-24: 0.25 mg via INTRAVENOUS
  Filled 2021-03-24: qty 5

## 2021-03-24 MED ORDER — HEPARIN SOD (PORK) LOCK FLUSH 100 UNIT/ML IV SOLN
500.0000 [IU] | Freq: Once | INTRAVENOUS | Status: AC | PRN
  Administered 2021-03-24: 500 [IU]

## 2021-03-24 MED ORDER — POTASSIUM CHLORIDE IN NACL 20-0.9 MEQ/L-% IV SOLN
INTRAVENOUS | Status: DC
  Filled 2021-03-24 (×2): qty 1000

## 2021-03-24 MED ORDER — SODIUM CHLORIDE 0.9 % IV SOLN: Freq: Once | INTRAVENOUS | Status: AC

## 2021-03-24 MED ORDER — SODIUM CHLORIDE 0.9 % IV SOLN
10.0000 mg | Freq: Once | INTRAVENOUS | Status: AC
  Administered 2021-03-24: 10 mg via INTRAVENOUS
  Filled 2021-03-24: qty 10

## 2021-03-24 NOTE — Progress Notes (Signed)
Nutrition Follow-up:  Patient receiving concurrent adjuvant chemoradiation therapy with weekly cisplatin for SCC of oral cavity. She is s/p G-tube replacement at [facility] 8/5.  Met with patient during infusion. She reports overall doing well and is in good spirits. Patient continues to give 4 cartons Osmolite 1.5 daily, yesterday she did 3 cartons. She continues to eat soft, smooth foods and drinking "lots" of fluids by mouth. She had bowl of oatmeal with apples and cinnamon this morning. Patient is drinking Fairlife nutrition shake daily, says they are delicious. Yesterday she had scalloped potatoes and some filling from a burrito. Patient reports constipation on Friday. She took milk of magnesia for this, says she had diarrhea for half the day on Saturday. She reports periods of feeling "queasy" and is taking compazine twice daily for this. She is going to reduce to once daily due to constipation. Patient is taking a stool softener, reports she will increase to twice daily as prescribed.   Medications: xylocaine, miralax, zofran, compazine, decadron  Labs: 9/13 - Na 134  Anthropometrics: Weight 158 lb today decreased from 161 lb 7 oz on 9/7 and 164 lb on 8/30. Patient has lost 2% (3 lbs) in the last 7 days and 3.7% (6 lbs) in 2 weeks. This is significant for time frame.    Estimated Energy Needs  Kcals: 5217-4715 Protein: 112-134 Fluid: 2.3 L  NUTRITION DIAGNOSIS: Inadequate oral intake ongoing, patient relying on tube feedings   INTERVENTION:  Encouraged consuming soft moist high protein foods as tolerated - handout with recipes provided Increase tube feeding to 5 cartons Osmolite 1.5 split over 4 feedings/day Continue drinking Fairlife Shake daily (150 kcal, 30 grams protein) Encourage baking soda, salt water rinses several times daily - handout with recipes for sore mouth provided Continue Lidocaine mouth rinse  Continue stool softener  MONITORING, EVALUATION, GOAL: weight trends,  intake, tube feeding   NEXT VISIT: Wednesday September 21 during infusion

## 2021-03-25 ENCOUNTER — Ambulatory Visit
Admission: RE | Admit: 2021-03-25 | Discharge: 2021-03-25 | Disposition: A | Discharge status: Home / Self Care | Payer: Medicare Other | Source: Ambulatory Visit | Attending: Radiation Oncology | Admitting: Radiation Oncology

## 2021-03-25 DIAGNOSIS — C060 Malignant neoplasm of cheek mucosa: Secondary | ICD-10-CM | POA: Diagnosis not present

## 2021-03-25 DIAGNOSIS — C411 Malignant neoplasm of mandible: Secondary | ICD-10-CM | POA: Diagnosis not present

## 2021-03-26 ENCOUNTER — Ambulatory Visit
Admission: RE | Admit: 2021-03-26 | Discharge: 2021-03-26 | Disposition: A | Discharge status: Home / Self Care | Payer: Medicare Other | Source: Ambulatory Visit | Attending: Radiation Oncology | Admitting: Radiation Oncology

## 2021-03-26 DIAGNOSIS — C060 Malignant neoplasm of cheek mucosa: Secondary | ICD-10-CM | POA: Diagnosis not present

## 2021-03-26 DIAGNOSIS — C411 Malignant neoplasm of mandible: Secondary | ICD-10-CM | POA: Diagnosis not present

## 2021-03-29 ENCOUNTER — Ambulatory Visit
Admission: RE | Admit: 2021-03-29 | Discharge: 2021-03-29 | Disposition: A | Discharge status: Home / Self Care | Payer: Medicare Other | Source: Ambulatory Visit | Attending: Radiation Oncology | Admitting: Radiation Oncology

## 2021-03-29 ENCOUNTER — Other Ambulatory Visit: Payer: Self-pay

## 2021-03-29 ENCOUNTER — Other Ambulatory Visit: Payer: Self-pay | Admitting: Radiation Oncology

## 2021-03-29 DIAGNOSIS — C031 Malignant neoplasm of lower gum: Secondary | ICD-10-CM

## 2021-03-29 DIAGNOSIS — C060 Malignant neoplasm of cheek mucosa: Secondary | ICD-10-CM | POA: Diagnosis not present

## 2021-03-29 DIAGNOSIS — C411 Malignant neoplasm of mandible: Secondary | ICD-10-CM | POA: Diagnosis not present

## 2021-03-29 MED ORDER — SONAFINE EX EMUL
1.0000 "application " | Freq: Two times a day (BID) | CUTANEOUS | Status: DC
  Administered 2021-03-29: 1 via TOPICAL

## 2021-03-29 MED ORDER — OMEPRAZOLE 20 MG PO CPDR: 20.0000 mg | DELAYED_RELEASE_CAPSULE | Freq: Every day | ORAL | 1 refills | Status: DC

## 2021-03-30 ENCOUNTER — Ambulatory Visit: Payer: Medicare Other | Admitting: Physical Therapy

## 2021-03-30 ENCOUNTER — Ambulatory Visit
Admission: RE | Admit: 2021-03-30 | Discharge: 2021-03-30 | Disposition: A | Discharge status: Home / Self Care | Payer: Medicare Other | Source: Ambulatory Visit | Attending: Radiation Oncology | Admitting: Radiation Oncology

## 2021-03-30 ENCOUNTER — Inpatient Hospital Stay: Payer: Medicare Other

## 2021-03-30 ENCOUNTER — Encounter: Payer: Self-pay | Admitting: Physical Therapy

## 2021-03-30 DIAGNOSIS — M25611 Stiffness of right shoulder, not elsewhere classified: Secondary | ICD-10-CM

## 2021-03-30 DIAGNOSIS — M25612 Stiffness of left shoulder, not elsewhere classified: Secondary | ICD-10-CM

## 2021-03-30 DIAGNOSIS — K1231 Oral mucositis (ulcerative) due to antineoplastic therapy: Secondary | ICD-10-CM | POA: Diagnosis not present

## 2021-03-30 DIAGNOSIS — M6281 Muscle weakness (generalized): Secondary | ICD-10-CM

## 2021-03-30 DIAGNOSIS — C411 Malignant neoplasm of mandible: Secondary | ICD-10-CM | POA: Diagnosis not present

## 2021-03-30 DIAGNOSIS — Z483 Aftercare following surgery for neoplasm: Secondary | ICD-10-CM | POA: Diagnosis not present

## 2021-03-30 DIAGNOSIS — R112 Nausea with vomiting, unspecified: Secondary | ICD-10-CM | POA: Diagnosis not present

## 2021-03-30 DIAGNOSIS — K5903 Drug induced constipation: Secondary | ICD-10-CM | POA: Diagnosis not present

## 2021-03-30 DIAGNOSIS — C060 Malignant neoplasm of cheek mucosa: Secondary | ICD-10-CM | POA: Diagnosis not present

## 2021-03-30 DIAGNOSIS — D696 Thrombocytopenia, unspecified: Secondary | ICD-10-CM | POA: Diagnosis not present

## 2021-03-30 DIAGNOSIS — I890 Lymphedema, not elsewhere classified: Secondary | ICD-10-CM | POA: Diagnosis not present

## 2021-03-30 DIAGNOSIS — R471 Dysarthria and anarthria: Secondary | ICD-10-CM | POA: Diagnosis not present

## 2021-03-30 DIAGNOSIS — L599 Disorder of the skin and subcutaneous tissue related to radiation, unspecified: Secondary | ICD-10-CM

## 2021-03-30 DIAGNOSIS — R262 Difficulty in walking, not elsewhere classified: Secondary | ICD-10-CM

## 2021-03-30 DIAGNOSIS — R634 Abnormal weight loss: Secondary | ICD-10-CM | POA: Diagnosis not present

## 2021-03-30 DIAGNOSIS — R1311 Dysphagia, oral phase: Secondary | ICD-10-CM | POA: Diagnosis not present

## 2021-03-30 DIAGNOSIS — D701 Agranulocytosis secondary to cancer chemotherapy: Secondary | ICD-10-CM | POA: Diagnosis not present

## 2021-03-30 DIAGNOSIS — T451X5A Adverse effect of antineoplastic and immunosuppressive drugs, initial encounter: Secondary | ICD-10-CM | POA: Diagnosis not present

## 2021-03-30 DIAGNOSIS — Z5111 Encounter for antineoplastic chemotherapy: Secondary | ICD-10-CM | POA: Diagnosis not present

## 2021-03-30 DIAGNOSIS — D649 Anemia, unspecified: Secondary | ICD-10-CM | POA: Diagnosis not present

## 2021-03-30 DIAGNOSIS — Z87891 Personal history of nicotine dependence: Secondary | ICD-10-CM | POA: Diagnosis not present

## 2021-03-30 LAB — BASIC METABOLIC PANEL - CANCER CENTER ONLY
Anion gap: 10 (ref 5–15)
BUN: 22 mg/dL (ref 8–23)
CO2: 27 mmol/L (ref 22–32)
Calcium: 9.3 mg/dL (ref 8.9–10.3)
Chloride: 96 mmol/L — ABNORMAL LOW (ref 98–111)
Creatinine: 0.75 mg/dL (ref 0.44–1.00)
GFR, Estimated: >60 mL/min (ref >60)
Glucose, Bld: 103 mg/dL — ABNORMAL HIGH (ref 70–99)
Potassium: 4.1 mmol/L (ref 3.5–5.1)
Sodium: 133 mmol/L — ABNORMAL LOW (ref 135–145)

## 2021-03-30 LAB — CBC WITH DIFFERENTIAL (CANCER CENTER ONLY)
Abs Immature Granulocytes: 0.01 10*3/uL (ref 0.00–0.07)
Basophils Absolute: 0 10*3/uL (ref 0.0–0.1)
Basophils Relative: 0 %
Eosinophils Absolute: 0 10*3/uL (ref 0.0–0.5)
Eosinophils Relative: 1 %
HCT: 35.4 % — ABNORMAL LOW (ref 36.0–46.0)
Hemoglobin: 11.9 g/dL — ABNORMAL LOW (ref 12.0–15.0)
Immature Granulocytes: 0 %
Lymphocytes Relative: 13 %
Lymphs Abs: 0.7 10*3/uL (ref 0.7–4.0)
MCH: 28.9 pg (ref 26.0–34.0)
MCHC: 33.6 g/dL (ref 30.0–36.0)
MCV: 85.9 fL (ref 80.0–100.0)
Monocytes Absolute: 0.4 10*3/uL (ref 0.1–1.0)
Monocytes Relative: 7 %
Neutro Abs: 4.3 10*3/uL (ref 1.7–7.7)
Neutrophils Relative %: 79 %
Platelet Count: 130 10*3/uL — ABNORMAL LOW (ref 150–400)
RBC: 4.12 MIL/uL (ref 3.87–5.11)
RDW: 15 % (ref 11.5–15.5)
WBC Count: 5.5 10*3/uL (ref 4.0–10.5)
nRBC: 0 % (ref 0.0–0.2)

## 2021-03-30 LAB — MAGNESIUM: Magnesium: 1.7 mg/dL (ref 1.7–2.4)

## 2021-03-30 NOTE — Therapy (Signed)
[place], [place], [address] Phone: [phone]   Fax:  [phone]  Physical Therapy Treatment  Patient Details  Name: Tammy Brady MRN: [id number] Date of Birth: Dec 19, 1950 Referring Provider (PT): Isidore Moos   Encounter Date: 03/30/2021   PT End of Session - 03/30/21 1201     Visit Number 2    Number of Visits 13    Date for PT Re-Evaluation 04/22/21    PT Start Time 1105    PT Stop Time 1154    PT Time Calculation (min) 49 min    Activity Tolerance Patient tolerated treatment well    Behavior During Therapy [facility] for tasks assessed/performed             Past Medical History:  Diagnosis Date   CAD (coronary artery disease)    Mild non-obstructive disease by cath January 2011   Carotid artery disease ([place])    Followed by Dr Kellie Simmering   GERD (gastroesophageal reflux disease)    Hyperlipidemia    Hypertension    Hypothyroidism     Past Surgical History:  Procedure Laterality Date   APPENDECTOMY     IR IMAGING GUIDED PORT INSERTION  03/02/2021    There were no vitals filed for this visit.   Subjective Assessment - 03/30/21 1107     Subjective I have been trying to do the exercises. I have a question about one of them. The radiation is getting rough.    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 CT neck same day revealed an enhancing soft tissue lesion along the buccal margin of the left mandible; with lucency in the adjacent bone consistent with known SCC. Also found were necrotic left level IA/B and II/IIIB lymph nodes, noted to be concerning for nodal metastasis. During physical exam performed at this visit, Dr. Amada Jupiter visualized the exophytic mass in and around the left mandibular canine, premolar, and first molar, along with an enlarged level 1A lymph node. The patient opted to proceed with  multiple oral biopsies, mandibular resectioning, and lymphadenectomies, 01/18/21 Resection performed on 01/18/21 revealed the following: 3.3cm tumor, Gr 2 Squamous cell carcinoma of alveolar ridge extending to buccal gingival junction and lingual sulcus, DOI 46mm, neg margins, 6/49 LN+ bilaterally with ECE; no PNI or LVSI; She also underwent Left fibula osteocutaneous free flap and ORIF mandible, will receive 30 fractions of radiation to her oral cavity and bilateral neck and 6 weeks of cisplatin chemotherapy. She started on 8/29 and will complete 04/19/21, PEG placed at [facility] on 01/18/21 and exchanged on 8/5 due to leakage. She uses PEG for most nutrition and medication. She is able to tolerate pureed foods but her reconstructive flap limits her swallowing, PAC placed at [facility] on 03/02/21, reports a 40 pack year smoking history, quitting off and on over the years, stopped smoking in May 2022.    Patient Stated Goals to gain info from providers    Currently in Pain? Yes    Pain Score 5     Pain Location Mouth    Pain Orientation Right;Left    Pain Descriptors / Indicators Sore    Pain Type Acute pain    Pain Onset 1 to 4 weeks ago    Pain Frequency Constant    Aggravating Factors  something touching sore areas    Pain Relieving Factors lidocaine rinse but doesnt last long, ibuprophen and  tylenol    Effect of Pain on Daily Activities unable to eat                               [facility] Adult PT Treatment/Exercise - 03/30/21 0001       Manual Therapy   Manual Therapy Manual Lymphatic Drainage (MLD);Edema management    Edema Management created foam chip pack in thin stockinette for pt to wear around head for compression at anterior neck    Manual Lymphatic Drainage (MLD) in supine with head of bed elevated: short neck, 5 diaphragmatic breaths, bilateral axillary nodes, bilateral pectoral nodes, anterior chest, short neck, posterior, lateral and anterior neck moving fluid towards  pathways, submental and submandibular areas moving fluid towards pathways then retracing all steps                          PT Long Term Goals - 03/11/21 0959       PT LONG TERM GOAL #1   Title Pt will be independent in self MLD for long term management of lymphedema.    Time 6    Period Weeks    Status New    Target Date 04/22/21      PT LONG TERM GOAL #2   Title Pt will obtain appropriate compression garments for long term management of lymphedema.    Time 6    Period Weeks    Status New    Target Date 04/22/21      PT LONG TERM GOAL #3   Title Pt will report she is able to descend steps with a step through gait pattern and a decreased fear of falling.    Baseline currently step to    Time 6    Period Weeks    Status New    Target Date 04/22/21      PT LONG TERM GOAL #4   Title Pt will be independent in a home exercise program for continued stretching and strengthening.    Time 6    Period Weeks    Status New    Target Date 04/22/21      PT LONG TERM GOAL #5   Title Pt will report her shoulder ROM has improved and she is no longer having trouble getting dressed or reaching overhead.    Time 6    Period Weeks    Status New    Target Date 04/22/21      Additional Long Term Goals   Additional Long Term Goals Yes      PT LONG TERM GOAL #6   Title Pt will be able to complete 13 sit to stands in 30 seconds without use of UEs to decrease fall risk.    Baseline 7    Time 6    Period Weeks    Status New    Target Date 04/22/21                   Plan - 03/30/21 1202     Clinical Impression Statement Began MLD to anterior neck today and educated pt and spouse on basic principles of self MLD using Norton anterior approach. Created foam chip pack for pt to wear at home for several hours a day to help manage anterior neck swelling. Pt is half way through radiation and is beginning to have increased pain in her mouth. Next session will issue  handout  and have pt and spouse practice and will also issue left ankle strengthening exercises since pt has had 3 recent falls when walking.    PT Frequency 2x / week    PT Duration 6 weeks    PT Treatment/Interventions ADLs/Self Care Home Management;Therapeutic exercise;Balance training;Neuromuscular re-education;Stair training;Gait training;Patient/family education;Orthotic Fit/Training;Manual techniques;Manual lymph drainage;Compression bandaging;Scar mobilization;Passive range of motion;Taping;Vasopneumatic Device;Joint Manipulations    PT Next Visit Plan measure bilat shoulder ROM and update last goal to include measurements- cont MLD and instruct pt and spouse/ issue handout, give L ankle strengthening exercises with theraband b/c pt has had 3 recent falls, scar mobilization, PROM to neck, give HEP for LE strengthening, once lymphedema is managed begin bilateral shoulder ROM and LE strengthening and balance    PT Home Exercise Plan head and neck ROM exercises    Consulted and Agree with Plan of Care Patient;Family member/caregiver    Family Member Consulted spouse             Patient will benefit from skilled therapeutic intervention in order to improve the following deficits and impairments:  Postural dysfunction, Impaired UE functional use, Impaired flexibility, Increased fascial restricitons, Decreased strength, Decreased knowledge of use of DME, Decreased knowledge of precautions, Decreased range of motion, Decreased scar mobility, Increased edema, Difficulty walking, Decreased balance  Visit Diagnosis: Lymphedema, not elsewhere classified  Disorder of the skin and subcutaneous tissue related to radiation, unspecified  Aftercare following surgery for neoplasm  Muscle weakness (generalized)  Difficulty in walking, not elsewhere classified  Stiffness of left shoulder, not elsewhere classified  Stiffness of right shoulder, not elsewhere classified  Malignant neoplasm of cheek  mucosa ([employer])     Problem List Patient Active Problem List   Diagnosis Date Noted   Port-A-Cath in place 03/09/2021   Carcinoma of lower gum ([place]) 02/20/2021   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[place]) 12/08/2020   Gastritis 12/08/2020   Anxiety 12/08/2020   Need for prophylactic vaccination and inoculation against influenza 07/30/2020   Depression 11/26/2015   GERD (gastroesophageal reflux disease) 11/26/2015   Migraine headache 11/26/2015   Screening for diabetes mellitus 09/03/2015   Hypothyroidism 07/22/2009   Mixed hyperlipidemia 07/22/2009   Tobacco user 07/22/2009   Hypertension, essential 07/22/2009   CAD, NATIVE VESSEL 07/22/2009   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   Coronary artery spasm [facility]) 07/22/2009    Allyson Sabal [place], PT 03/30/2021, 12:04 PM  [place], [place], [address] Phone: [phone]   Fax:  [phone]  Name: Tammy Brady MRN: [id number] Date of Birth: 23-Mar-1951   Manus Gunning, PT 03/30/21 12:04 PM

## 2021-03-30 NOTE — Progress Notes (Signed)
[place] NOTE  Patient Care Team: Marge Duncans, Hershal Coria as PCP - General (Physician Assistant)  CHIEF COMPLAINTS/PURPOSE OF CONSULTATION:  SCC Oral mucosa  ASSESSMENT & PLAN:  Chemotherapy-induced nausea Mild without vomiting. She takes nausea medication PRN,  We will continue to monitor.  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  Her pathologic staging is pT3 N3B. Given positive margins and presence of extracapsular extension, we agreed with adjuvant chemoradiation. She completed 3 weekly cycles of cisplatin concurrent with radiation. Okay to proceed with labs today  Mucositis due to antineoplastic therapy She was quite reluctant to proceed with pain management and offered during her last visit.  She is however having severe pain from the mouth sores and she is having difficulty even using her straw to drink water.  She would like to try the oxycodone pills and this has been prescribed to her today.  We will continue to monitor the mucositis.  Drug induced constipation Drug-induced constipation, uses Colace as needed.   HISTORY OF PRESENTING ILLNESS:  MELIAH APPLEMAN 70 y.o. female is here because of  SCC oral mucosa  This is a pleasant 70 year old female patient who first presented to her dentist with a painful lesion to her gums noticed more like at the end of 2021.  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  Subsequently she was referred to Dr. Amada Jupiter on December 14, 2020 who discussed surgical  intervention with patient.  She had neck CT which showed an enhancing soft tissue lesion along the buccal margin of left mandible with a lucency in the adjacent bone consistent with known SCC.  She was also found to have necrotic left level 1A/PN2/3B lymph nodes noted to be concerning for nodal metastasis.  She underwent resection on January 18, 2021 which revealed 3.3 cm tumor, grade 2 squamous cell carcinoma of the alveolar ridge extending to buccal gingival junction and lingual sulcus, depth of invasion 11 mm, negative margins, 6 out of 49 lymph nodes positive bilaterally with extracapsular extension, no perineural invasion or lymphovascular invasion.  She is now referred to medical oncology for consideration of adjuvant chemoradiation.  PATHOLOGY REPORT: Diagnosis     A: Mouth, RMT mucosal margin, excision - Benign squamous mucosa and submucosa with no tumor seen.   B: Mouth, left buccal margin, excision - Benign squamous mucosa and submucosa with no tumor seen.   C: Mouth, labial mucosal margin, excision - Benign squamous mucosa and submucosa with no tumor seen.   D: Mouth, anterior gingival mucosal margin, excision - Benign squamous mucosa and submucosa with no tumor seen.   E: Mouth, floor of mouth, biopsy - Benign squamous mucosa and submucosa with no tumor seen.   F: Tongue, ventral tongue, biopsy - Benign squamous mucosa and submucosa with no tumor seen.   G: Bone marrow, anterior mandible, biopsy - Fragment of blood clot, no tumor seen, on original frozen section only, tissue has been cut through on permanent sections.   H: Bone marrow, posterior mandible, biopsy - Fragment of blood clot, no tumor seen, on  original frozen section only, tissue has been cut through on permanent sections.   I: Lymph node, left neck level 1B and level 1A, lymphadenectomy - Metastatic squamous cell carcinoma with cystic change, involving 2 of 4 lymph nodes (2/4), size of largest metastasis 2.2 cm. -  Benign submandibular gland is also present.   J: Lymph node, right level 1B, lymphadenectomy - Metastatic squamous cell carcinoma involving 2 of 4 lymph nodes (2/4), with cystic change, size of largest metastasis 2.0 cm in greatest dimension, with extranodal extension of tumor identified. - Submandibular gland present with no tumor seen.   K: Lymph node, left EJ, excision - No tumor seen in three lymph nodes (0/3).   L: Lymph node, left neck, level 2, lymphadenectomy - No tumor seen in 14 lymph nodes (0/14).   M: Lymph node, left neck, level 3, lymphadenectomy - Metastatic squamous cell carcinoma involving 1 of 7 lymph nodes (1/7), size of metastasis 2.2 cm, with extranodal extension of tumor identified.   N: Lymph node, left neck, level 4, lymphadenectomy - Metastatic squamous cell carcinoma involving 1 of 2 lymph nodes (1/2), size of metastasis 1 mm.   O: Lymph node, right neck, level 2, lymphadenectomy - No tumor seen in eight lymph nodes (0/8).   P: Lymph node, right neck, level 3, lymphadenectomy - No tumor seen in four lymph nodes (0/4).   Q: Lymph node, right neck, level 4, lymphadenectomy - No tumor seen in three lymph nodes (0/3).   R: Left composite mandibular resection (segmental mandibulectomy and resection of submental gland) - Squamous cell carcinoma, moderately differentiated, of alveolar ridge extending to buccal gingival junction and lingual sulcus. - Tumor size 3.3 cm in greatest lateral dimension, depth of invasion 1.1 cm. - Tumor involves the alveolar bone and does not exceed the depth of the roots of the teeth, and does not extend into the body of the mandible.  - Surgical margins free of tumor (soft tissue and bone margins all negative for tumor) - Submental gland present with no tumor seen.   S: Teeth, extraction - Multiple intact and fragmented teeth (gross examination only).     Given positive margin and extracapsular extension, we have discussed about  adjuvant chemoradiation with weekly cisplatin.  Started weekly cisplatin on 03/10/2021. Cycle 2-day 1 on 03/17/2021 Cycle 3-day 1 on 03/24/2021.  Interval history  She is here for follow-up prior to cycle 4 of chemotherapy.   She is having severe pain since her last visit.  She would like to try the oxycodone as needed for pain management.  She is having difficulty drinking water with a straw given the mouth sores.  Besides that she has some mild nausea without vomiting, uses Zofran as needed.  She is maintaining her nourishment through her G-tube, no significant weight loss since last visit. Constipation, uses as needed Colace. No change in breathing, hearing changes, tinnitus, neuropathy reported. No change in urination. Rest of the pertinent 10 point ROS reviewed and negative.   MEDICAL HISTORY:  Past Medical History:  Diagnosis Date   CAD (coronary artery disease)    Mild non-obstructive disease by cath January 2011   Carotid artery disease ([place])    Followed by Dr Kellie Simmering   GERD (gastroesophageal reflux disease)    Hyperlipidemia    Hypertension    Hypothyroidism     SURGICAL HISTORY: Past Surgical History:  Procedure Laterality Date   APPENDECTOMY     IR IMAGING GUIDED PORT INSERTION  03/02/2021  SOCIAL HISTORY: Social History   Socioeconomic History   Marital status: Married    Spouse name: Not on file   Number of children: Not on file   Years of education: Not on file   Highest education level: Not on file  Occupational History   Occupation: Dealer    Employer: [employer]  Tobacco Use   Smoking status: Former    Packs/day: 1.00    Years: 40.00    Pack years: 40.00    Types: Cigarettes    Quit date: 05/11/2009    Years since quitting: 11.8   Smokeless tobacco: Never  Substance and Sexual Activity   Alcohol use: Not Currently   Drug use: No   Sexual activity: Not Currently  Other Topics Concern   Not on file  Social History Narrative   Not on  file   Social Determinants of Health   Financial Resource Strain: Low Risk    Difficulty of Paying Living Expenses: Not hard at all  Food Insecurity: No Food Insecurity   Worried About Charity fundraiser in the Last Year: Never true   [place] in the Last Year: Never true  Transportation Needs: No Transportation Needs   Lack of Transportation (Medical): No   Lack of Transportation (Non-Medical): No  Physical Activity: Not on file  Stress: No Stress Concern Present   Feeling of Stress : Only a little  Social Connections: Engineer, building services of Communication with Friends and Family: More than three times a week   Frequency of Social Gatherings with Friends and Family: More than three times a week   Attends Religious Services: More than 4 times per year   Active Member of [employer] or Organizations: Yes   Attends Music therapist: More than 4 times per year   Marital Status: Married  Human resources officer Violence: Not on file    FAMILY HISTORY: Family History  Problem Relation Age of Onset   Cancer Mother    Coronary artery disease Father    Coronary artery disease Maternal Grandmother     ALLERGIES:  is allergic to codeine.  MEDICATIONS:  Current Outpatient Medications  Medication Sig Dispense Refill   gabapentin (NEURONTIN) 300 MG capsule Take 1 capsule by mouth 3 (three) times daily.     ibuprofen (ADVIL) 600 MG tablet Take 1 tablet by mouth every 6 (six) hours as needed.     acetaminophen (TYLENOL) 500 MG tablet Take 500 mg by mouth every 6 (six) hours as needed.     aspirin 325 MG tablet Take 650 mg by mouth as needed for mild pain or moderate pain.     calcium carbonate (OS-CAL) 600 MG TABS Take 600 mg by mouth daily.     dexamethasone (DECADRON) 4 MG tablet Take 2 tablets (8 mg total) by mouth daily. Take daily x 3 days starting the day after cisplatin chemotherapy. Take with food. 30 tablet 1   diltiazem (CARDIZEM CD) 360 MG 24 hr capsule TAKE  1 CAPSULE(360 MG) BY MOUTH DAILY 90 capsule 0   levothyroxine (SYNTHROID) 112 MCG tablet TAKE 1 TABLET BY MOUTH DAILY 90 tablet 0   lidocaine (XYLOCAINE) 2 % solution Patient: Mix 1part 2% viscous lidocaine, 1part H20. Swish & swallow 68mL of diluted mixture, 8min before meals and at bedtime, up to QID 200 mL 4   lidocaine-prilocaine (EMLA) cream Apply to affected area once 30 g 3   Multiple Vitamin (MULTIVITAMIN) tablet Take 1 tablet by  mouth daily.     nitroGLYCERIN (NITROSTAT) 0.4 MG SL tablet Place 1 tablet (0.4 mg total) under the tongue every 5 (five) minutes as needed. 25 tablet 6   Nutritional Supplements (FEEDING SUPPLEMENT, OSMOLITE 1.5 CAL,) LIQD Place into feeding tube daily. 4-5 cartons daily     omeprazole (PRILOSEC) 20 MG capsule Take 1 capsule (20 mg total) by mouth daily. 30 capsule 1   ondansetron (ZOFRAN) 8 MG tablet Take 1 tablet (8 mg total) by mouth 2 (two) times daily as needed. Start on the third day after cisplatin chemotherapy. 30 tablet 1   Polyethylene Glycol 3350 (MIRALAX PO) Take by mouth as needed.     prochlorperazine (COMPAZINE) 10 MG tablet Take 1 tablet (10 mg total) by mouth every 6 (six) hours as needed (Nausea or vomiting). 30 tablet 1   simvastatin (ZOCOR) 40 MG tablet TAKE 1 TABLET(40 MG) BY MOUTH AT BEDTIME 90 tablet 0   traZODone (DESYREL) 50 MG tablet Take 1 tablet (50 mg total) by mouth at bedtime. 90 tablet 1   No current facility-administered medications for this visit.   Facility-Administered Medications Ordered in Other Visits  Medication Dose Route Frequency Provider Last Rate Last Admin   0.9 %  sodium chloride infusion   Intravenous Once Kynzee Devinney, MD       0.9 % NaCl with KCl 20 mEq/ L  infusion   Intravenous Continuous Samiel Peel, MD       CISplatin (PLATINOL) 74 mg in sodium chloride 0.9 % 250 mL chemo infusion  40 mg/m2 (Treatment Plan Recorded) Intravenous Once Blease Capaldi, Arletha Pili, MD       dexamethasone (DECADRON) 10 mg in sodium  chloride 0.9 % 50 mL IVPB  10 mg Intravenous Once Keithan Dileonardo, MD       fosaprepitant (EMEND) 150 mg in sodium chloride 0.9 % 145 mL IVPB  150 mg Intravenous Once Arlanda Shiplett, MD       magnesium sulfate IVPB 2 g 50 mL  2 g Intravenous Once Berneice Zettlemoyer, MD       palonosetron (ALOXI) injection 0.25 mg  0.25 mg Intravenous Once Markan Cazarez, Arletha Pili, MD        PHYSICAL EXAMINATION:  ECOG PERFORMANCE STATUS: 0 - Asymptomatic  Vitals:   03/31/21 0843  BP: 128/62  Pulse: 67  Resp: 17  Temp: (!) 97.5 F (36.4 C)  SpO2: 95%   Physical Exam Constitutional:      Appearance: Normal appearance.  HENT:     Head: Normocephalic and atraumatic.     Mouth/Throat:     Comments: Mouth ulcers noted. Cardiovascular:     Rate and Rhythm: Normal rate and regular rhythm.     Pulses: Normal pulses.     Heart sounds: Normal heart sounds.  Pulmonary:     Effort: Pulmonary effort is normal.     Breath sounds: Normal breath sounds.  Abdominal:     General: Abdomen is flat.     Palpations: Abdomen is soft.     Comments: G-tube appears well.  Musculoskeletal:        General: No swelling or tenderness.     Cervical back: Normal range of motion and neck supple. No rigidity.  Lymphadenopathy:     Cervical: No cervical adenopathy.  Skin:    General: Skin is warm and dry.  Neurological:     General: No focal deficit present.     Mental Status: She is alert.  Psychiatric:        Mood  and Affect: Mood normal.    LABORATORY DATA:  I have reviewed the data as listed Lab Results  Component Value Date   WBC 5.5 03/30/2021   HGB 11.9 (L) 03/30/2021   HCT 35.4 (L) 03/30/2021   MCV 85.9 03/30/2021   PLT 130 (L) 03/30/2021     Chemistry      Component Value Date/Time   NA 133 (L) 03/30/2021 0957   NA 142 12/08/2020 1356   K 4.1 03/30/2021 0957   CL 96 (L) 03/30/2021 0957   CO2 27 03/30/2021 0957   BUN 22 03/30/2021 0957   BUN 15 12/08/2020 1356   CREATININE 0.75 03/30/2021 0957       Component Value Date/Time   CALCIUM 9.3 03/30/2021 0957   ALKPHOS 84 12/08/2020 1356   AST 39 12/08/2020 1356   ALT 24 12/08/2020 1356   BILITOT <0.2 12/08/2020 1356    Have reviewed pertinent labs, mild anemia, mild thrombocytopenia with a platelet count of 1 30,000. BMP is normal, magnesium is normal.  RADIOGRAPHIC STUDIES: I have personally reviewed the radiological images as listed and agreed with the findings in the report. 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 TECHNIQUE: The procedure, risks, benefits, and alternatives were explained to the patient. Questions regarding the procedure were encouraged and answered. The patient understands and consents to the procedure. Patency of the right IJ vein was confirmed with ultrasound with image documentation. An appropriate skin site was determined. Skin site was marked. Region was prepped using maximum barrier technique including cap and mask, sterile gown, sterile gloves, large sterile sheet, and Chlorhexidine as cutaneous antisepsis. The region was infiltrated locally with 1% lidocaine. Under real-time ultrasound guidance, the right IJ vein was accessed with a 21 gauge micropuncture needle; the needle tip within the vein was confirmed with ultrasound image documentation. Needle was exchanged over a 018 guidewire for transitional dilator, and vascular measurement was performed. A small incision was made on the right anterior chest wall and a subcutaneous pocket fashioned.  The power-injectable port was positioned and its catheter tunneled to the right IJ dermatotomy site. The transitional dilator was exchanged over an Amplatz wire for a peel-away sheath, through which the port catheter, which had been trimmed to the appropriate length, was advanced and positioned under fluoroscopy with its tip at the cavoatrial junction. Spot chest radiograph confirms good catheter position and no pneumothorax. The port was flushed per protocol. The pocket was closed with deep interrupted and subcuticular continuous 3-0 Monocryl sutures. The incisions were covered with Dermabond then covered with a sterile dressing. The patient tolerated the procedure well. COMPLICATIONS: COMPLICATIONS None immediate IMPRESSION: Technically successful right IJ power-injectable port catheter placement. Ready for routine use. Electronically Signed   By: Lucrezia Europe M.D.   On: 03/02/2021 15:09    All questions were answered. The patient knows to call the clinic with any problems, questions or concerns.     Benay Pike, MD 03/31/2021 9:21 AM

## 2021-03-31 ENCOUNTER — Ambulatory Visit
Admission: RE | Admit: 2021-03-31 | Discharge: 2021-03-31 | Disposition: A | Discharge status: Home / Self Care | Payer: Medicare Other | Source: Ambulatory Visit | Attending: Radiation Oncology | Admitting: Radiation Oncology

## 2021-03-31 ENCOUNTER — Inpatient Hospital Stay: Payer: Medicare Other

## 2021-03-31 ENCOUNTER — Encounter: Payer: Self-pay | Admitting: Hematology and Oncology

## 2021-03-31 ENCOUNTER — Other Ambulatory Visit: Payer: Self-pay

## 2021-03-31 ENCOUNTER — Inpatient Hospital Stay: Payer: Medicare Other | Admitting: Hematology and Oncology

## 2021-03-31 ENCOUNTER — Other Ambulatory Visit: Payer: Medicare Other

## 2021-03-31 ENCOUNTER — Inpatient Hospital Stay: Payer: Medicare Other | Admitting: Dietician

## 2021-03-31 VITALS — BP 128/62 | HR 67 | Temp 97.5°F | Resp 17 | Wt 157.0 lb

## 2021-03-31 DIAGNOSIS — C060 Malignant neoplasm of cheek mucosa: Secondary | ICD-10-CM

## 2021-03-31 DIAGNOSIS — C411 Malignant neoplasm of mandible: Secondary | ICD-10-CM | POA: Diagnosis not present

## 2021-03-31 DIAGNOSIS — D701 Agranulocytosis secondary to cancer chemotherapy: Secondary | ICD-10-CM | POA: Diagnosis not present

## 2021-03-31 DIAGNOSIS — R634 Abnormal weight loss: Secondary | ICD-10-CM | POA: Diagnosis not present

## 2021-03-31 DIAGNOSIS — Z87891 Personal history of nicotine dependence: Secondary | ICD-10-CM

## 2021-03-31 DIAGNOSIS — R110 Nausea: Secondary | ICD-10-CM | POA: Diagnosis not present

## 2021-03-31 DIAGNOSIS — D649 Anemia, unspecified: Secondary | ICD-10-CM | POA: Diagnosis not present

## 2021-03-31 DIAGNOSIS — K1231 Oral mucositis (ulcerative) due to antineoplastic therapy: Secondary | ICD-10-CM | POA: Insufficient documentation

## 2021-03-31 DIAGNOSIS — T451X5D Adverse effect of antineoplastic and immunosuppressive drugs, subsequent encounter: Secondary | ICD-10-CM | POA: Diagnosis not present

## 2021-03-31 DIAGNOSIS — T451X5A Adverse effect of antineoplastic and immunosuppressive drugs, initial encounter: Secondary | ICD-10-CM | POA: Diagnosis not present

## 2021-03-31 DIAGNOSIS — Z5111 Encounter for antineoplastic chemotherapy: Secondary | ICD-10-CM | POA: Diagnosis not present

## 2021-03-31 DIAGNOSIS — R112 Nausea with vomiting, unspecified: Secondary | ICD-10-CM | POA: Diagnosis not present

## 2021-03-31 DIAGNOSIS — K5903 Drug induced constipation: Secondary | ICD-10-CM

## 2021-03-31 DIAGNOSIS — D696 Thrombocytopenia, unspecified: Secondary | ICD-10-CM | POA: Diagnosis not present

## 2021-03-31 MED ORDER — OXYCODONE HCL 5 MG PO TABS: 5.0000 mg | ORAL_TABLET | Freq: Four times a day (QID) | ORAL | 0 refills | Status: DC | PRN

## 2021-03-31 MED ORDER — POTASSIUM CHLORIDE IN NACL 20-0.9 MEQ/L-% IV SOLN
INTRAVENOUS | Status: DC
  Filled 2021-03-31 (×2): qty 1000

## 2021-03-31 MED ORDER — SODIUM CHLORIDE 0.9 % IV SOLN
10.0000 mg | Freq: Once | INTRAVENOUS | Status: AC
  Administered 2021-03-31: 10 mg via INTRAVENOUS
  Filled 2021-03-31: qty 10

## 2021-03-31 MED ORDER — PALONOSETRON HCL INJECTION 0.25 MG/5ML
0.2500 mg | Freq: Once | INTRAVENOUS | Status: AC
  Administered 2021-03-31: 0.25 mg via INTRAVENOUS
  Filled 2021-03-31: qty 5

## 2021-03-31 MED ORDER — MAGNESIUM SULFATE 2 GM/50ML IV SOLN
2.0000 g | Freq: Once | INTRAVENOUS | Status: AC
  Administered 2021-03-31: 2 g via INTRAVENOUS
  Filled 2021-03-31: qty 50

## 2021-03-31 MED ORDER — SODIUM CHLORIDE 0.9 % IV SOLN: Freq: Once | INTRAVENOUS | Status: AC

## 2021-03-31 MED ORDER — SODIUM CHLORIDE 0.9 % IV SOLN
150.0000 mg | Freq: Once | INTRAVENOUS | Status: AC
  Administered 2021-03-31: 150 mg via INTRAVENOUS
  Filled 2021-03-31: qty 150

## 2021-03-31 MED ORDER — SODIUM CHLORIDE 0.9 % IV SOLN
40.0000 mg/m2 | Freq: Once | INTRAVENOUS | Status: AC
  Administered 2021-03-31: 74 mg via INTRAVENOUS
  Filled 2021-03-31: qty 74

## 2021-03-31 NOTE — Addendum Note (Signed)
Addended by: Adaline Sill on: 03/31/2021 10:59 AM   Modules accepted: Orders

## 2021-03-31 NOTE — Assessment & Plan Note (Signed)
Mild without vomiting. She takes nausea medication PRN,  We will continue to monitor.

## 2021-03-31 NOTE — Assessment & Plan Note (Signed)
Drug-induced constipation, uses Colace as needed.

## 2021-03-31 NOTE — Assessment & Plan Note (Signed)
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  Her pathologic staging is pT3 N3B. Given positive margins and presence of extracapsular extension, we agreed with adjuvant chemoradiation. She completed 3 weekly cycles of cisplatin concurrent with radiation. Okay to proceed with labs today

## 2021-03-31 NOTE — Assessment & Plan Note (Signed)
She was quite reluctant to proceed with pain management and offered during her last visit.  She is however having severe pain from the mouth sores and she is having difficulty even using her straw to drink water.  She would like to try the oxycodone pills and this has been prescribed to her today.  We will continue to monitor the mucositis.

## 2021-03-31 NOTE — Progress Notes (Signed)
Nutrition Follow-up:  Patient receiving concurrent adjuvant chemoradiation with weekly cisplatin for SCC of oral cavity. S/p G[facility]tube replacement at [facility] 8/5.  Met with patient during infusion. Patient reports worsening sore mouth and mucositis. She is using Lidocaine and baking soda, salt water rinses several times daily. Patient reports trying potato soup recipe last week, says it was delicious. She has not eaten soft foods due to pain since Sunday. She is unable to use straw for liquids, but continues to take sips of water and [employer], Sprite tastes "so good" to her, but unable to tolerate unless it is flat. Patient reports taking zofran for nausea as needed. She denies vomiting, reports nausea when riding in the car and triggered by smells. Patient reports she increased tube feedings to 5 cartons/day and is tolerating this well.   Medications: Oxycodone, Decadron, Zofran  Labs: Glucose 103, Na 133  Anthropometrics: Weight today 157 lb today decreased from 158 lb on 9/14 and 161 lb 7 oz on 9/7 and 164 lb on 8/30   Estimated Energy Needs  Kcals: 2230-2455 Protein: 112-134 Fluid: 2.3 L  NUTRITION DIAGNOSIS: Inadequate oral intake ongoing, patient relying on tube feedings   INTERVENTION:  Patient will increase tube feedings to  6 cartons Osmolite 1.5 split over 4 feedings/day. Flush with 90 ml water before and after each feeding.Drink by mouth or give via tube additional 2 cups fluids daily. This will provide 2130 kcal, 89 grams protein, 2286 ml total water Continue drinking [employer] as tolerated for added calories and protein (150 kcal, 30 grams protein each) Discussed strategies for nausea, continue taking Zofran as needed Continue baking soda, salt water rinses several times daily Continue Lidocaine rinses Continue stool softener Provided support and encouragement Patient has contact information    MONITORING, EVALUATION, GOAL: weight trends, intake   NEXT VISIT:  Wednesday September 28 during infusion

## 2021-04-01 ENCOUNTER — Encounter: Payer: Self-pay | Admitting: Rehabilitation

## 2021-04-01 ENCOUNTER — Ambulatory Visit: Payer: Medicare Other | Admitting: Rehabilitation

## 2021-04-01 ENCOUNTER — Ambulatory Visit
Admission: RE | Admit: 2021-04-01 | Discharge: 2021-04-01 | Disposition: A | Discharge status: Home / Self Care | Payer: Medicare Other | Source: Ambulatory Visit | Attending: Radiation Oncology | Admitting: Radiation Oncology

## 2021-04-01 DIAGNOSIS — Z483 Aftercare following surgery for neoplasm: Secondary | ICD-10-CM

## 2021-04-01 DIAGNOSIS — R471 Dysarthria and anarthria: Secondary | ICD-10-CM | POA: Diagnosis not present

## 2021-04-01 DIAGNOSIS — I890 Lymphedema, not elsewhere classified: Secondary | ICD-10-CM | POA: Diagnosis not present

## 2021-04-01 DIAGNOSIS — C411 Malignant neoplasm of mandible: Secondary | ICD-10-CM | POA: Diagnosis not present

## 2021-04-01 DIAGNOSIS — M25612 Stiffness of left shoulder, not elsewhere classified: Secondary | ICD-10-CM | POA: Diagnosis not present

## 2021-04-01 DIAGNOSIS — R262 Difficulty in walking, not elsewhere classified: Secondary | ICD-10-CM | POA: Diagnosis not present

## 2021-04-01 DIAGNOSIS — M6281 Muscle weakness (generalized): Secondary | ICD-10-CM

## 2021-04-01 DIAGNOSIS — M25611 Stiffness of right shoulder, not elsewhere classified: Secondary | ICD-10-CM | POA: Diagnosis not present

## 2021-04-01 DIAGNOSIS — C060 Malignant neoplasm of cheek mucosa: Secondary | ICD-10-CM | POA: Diagnosis not present

## 2021-04-01 DIAGNOSIS — R1311 Dysphagia, oral phase: Secondary | ICD-10-CM | POA: Diagnosis not present

## 2021-04-01 DIAGNOSIS — L599 Disorder of the skin and subcutaneous tissue related to radiation, unspecified: Secondary | ICD-10-CM | POA: Diagnosis not present

## 2021-04-01 NOTE — Therapy (Signed)
[place], [place], [address] Phone: [phone]   Fax:  [phone]  Physical Therapy Treatment  Patient Details  Name: Tammy Brady MRN: [id number] Date of Birth: [birth year] Referring Provider (PT): Isidore Moos   Encounter Date: 04/01/2021   PT End of Session - 04/01/21 1056     Visit Number 3    Number of Visits 13    Date for PT Re-Evaluation 04/22/21    PT Start Time 1000    PT Stop Time 1054    PT Time Calculation (min) 54 min    Activity Tolerance Patient tolerated treatment well    Behavior During Therapy [facility] for tasks assessed/performed             Past Medical History:  Diagnosis Date   CAD (coronary artery disease)    Mild non-obstructive disease by cath January 2011   Carotid artery disease ([place])    Followed by Dr Kellie Simmering   GERD (gastroesophageal reflux disease)    Hyperlipidemia    Hypertension    Hypothyroidism     Past Surgical History:  Procedure Laterality Date   APPENDECTOMY     IR IMAGING GUIDED PORT INSERTION  03/02/2021    There were no vitals filed for this visit.   Subjective Assessment - 04/01/21 1003     Subjective I started taking the pain and oxy and it is helping the pain    Pertinent History Left Mandibular Squamous Cell Carcinoma, stage IVB (T3, N3b, M0), biopsy completed on 11/26/20 revealed SCC of the left mandible. CT neck same day revealed an enhancing soft tissue lesion along the buccal margin of the left mandible; with lucency in the adjacent bone consistent with known SCC. Also found were necrotic left level IA/B and II/IIIB lymph nodes, noted to be concerning for nodal metastasis. During physical exam performed at this visit, Dr. Amada Jupiter visualized the exophytic mass in and around the left mandibular canine, premolar, and first molar, along with an enlarged level 1A lymph node. The patient opted to proceed with multiple oral biopsies, mandibular resectioning,  and lymphadenectomies, 01/18/21 Resection performed on 01/18/21 revealed the following: 3.3cm tumor, Gr 2 Squamous cell carcinoma of alveolar ridge extending to buccal gingival junction and lingual sulcus, DOI 56mm, neg margins, 6/49 LN+ bilaterally with ECE; no PNI or LVSI; She also underwent Left fibula osteocutaneous free flap and ORIF mandible, will receive 30 fractions of radiation to her oral cavity and bilateral neck and 6 weeks of cisplatin chemotherapy. She started on 8/29 and will complete 04/19/21, PEG placed at [facility] on 01/18/21 and exchanged on 8/5 due to leakage. She uses PEG for most nutrition and medication. She is able to tolerate pureed foods but her reconstructive flap limits her swallowing, PAC [facility] at [facility] on 03/02/21, reports a 40 pack year smoking history, quitting off and on over the years, stopped smoking in May 2022.    Currently in Pain? Yes    Pain Score 4     Pain Location --   tongue and mouth                              [facility] Adult PT Treatment/Exercise - 04/01/21 0001       Manual Therapy   Manual Lymphatic Drainage (MLD) started with instruction in spouse and self MLD in seated following anterior norton handout omitting step 7 due to chest size. Then PT performed in  supine with head of bed elevated: short neck, 5 diaphragmatic breaths, bilateral axillary nodes, bilateral pectoral nodes, anterior chest, short neck, posterior, lateral and anterior neck moving fluid towards pathways, submental and submandibular areas moving fluid towards pathways then retracing all steps                     PT Education - 04/01/21 1055     Education Details self MLD    Person(s) Educated Patient;Spouse    Methods Explanation;Demonstration;Tactile cues;Verbal cues;Handout    Comprehension Verbalized understanding;Returned demonstration;Verbal cues required;Tactile cues required                 PT Long Term Goals - 03/11/21 0959       PT LONG  TERM GOAL #1   Title Pt will be independent in self MLD for long term management of lymphedema.    Time 6    Period Weeks    Status New    Target Date 04/22/21      PT LONG TERM GOAL #2   Title Pt will obtain appropriate compression garments for long term management of lymphedema.    Time 6    Period Weeks    Status New    Target Date 04/22/21      PT LONG TERM GOAL #3   Title Pt will report she is able to descend steps with a step through gait pattern and a decreased fear of falling.    Baseline currently step to    Time 6    Period Weeks    Status New    Target Date 04/22/21      PT LONG TERM GOAL #4   Title Pt will be independent in a home exercise program for continued stretching and strengthening.    Time 6    Period Weeks    Status New    Target Date 04/22/21      PT LONG TERM GOAL #5   Title Pt will report her shoulder ROM has improved and she is no longer having trouble getting dressed or reaching overhead.    Time 6    Period Weeks    Status New    Target Date 04/22/21      Additional Long Term Goals   Additional Long Term Goals Yes      PT LONG TERM GOAL #6   Title Pt will be able to complete 13 sit to stands in 30 seconds without use of UEs to decrease fall risk.    Baseline 7    Time 6    Period Weeks    Status New    Target Date 04/22/21                   Plan - 04/01/21 1056     Clinical Impression Statement MLD education focus for husband and pt with handout given.  Pts anterior neck is starting to get red despite no pain due to overall numbness with nerve damage.  instructed that it will most likely be soon where they need to skip neck work and focus on breathing and chest clearance.  Will focus on MLD review and left ankle HEP next visit.    PT Frequency 2x / week    PT Duration 6 weeks    PT Treatment/Interventions ADLs/Self Care Home Management;Therapeutic exercise;Balance training;Neuromuscular re-education;Stair training;Gait  training;Patient/family education;Orthotic Fit/Training;Manual techniques;Manual lymph drainage;Compression bandaging;Scar mobilization;Passive range of motion;Taping;Vasopneumatic Device;Joint Manipulations    PT Next Visit Plan measure  bilat shoulder ROM and update last goal to include measurements- cont MLD monitoring skin, give L ankle strengthening exercises with theraband b/c pt has had 3 recent falls, scar mobilization, PROM to neck, give HEP for LE strengthening, once lymphedema is managed begin bilateral shoulder ROM and LE strengthening and balance             Patient will benefit from skilled therapeutic intervention in order to improve the following deficits and impairments:     Visit Diagnosis: Lymphedema, not elsewhere classified  Disorder of the skin and subcutaneous tissue related to radiation, unspecified  Aftercare following surgery for neoplasm  Muscle weakness (generalized)  Difficulty in walking, not elsewhere classified     Problem List Patient Active Problem List   Diagnosis Date Noted   Mucositis due to antineoplastic therapy 03/31/2021   Drug induced constipation 03/31/2021   Chemotherapy-induced nausea 03/31/2021   Port-A-Cath in place 03/09/2021   Carcinoma of lower gum ([place]) 02/20/2021   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[place]) 12/08/2020   Gastritis 12/08/2020   Anxiety 12/08/2020   Need for prophylactic vaccination and inoculation against influenza 07/30/2020   Depression 11/26/2015   GERD (gastroesophageal reflux disease) 11/26/2015   Migraine headache 11/26/2015   Screening for diabetes mellitus 09/03/2015   Hypothyroidism 07/22/2009   Mixed hyperlipidemia 07/22/2009   Tobacco user 07/22/2009   Hypertension, essential 07/22/2009   CAD, NATIVE VESSEL 07/22/2009   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   Coronary artery spasm ([place]) 07/22/2009    [name], PT 04/01/2021, 10:58 AM  [place], [place], [address] Phone: [phone]   Fax:  [phone]  Name: Tammy Brady MRN: [id number] Date of Birth: [birth year]

## 2021-04-02 ENCOUNTER — Ambulatory Visit
Admission: RE | Admit: 2021-04-02 | Discharge: 2021-04-02 | Disposition: A | Discharge status: Home / Self Care | Payer: Medicare Other | Source: Ambulatory Visit | Attending: Radiation Oncology | Admitting: Radiation Oncology

## 2021-04-02 ENCOUNTER — Other Ambulatory Visit: Payer: Self-pay

## 2021-04-02 ENCOUNTER — Encounter: Payer: Self-pay | Admitting: Hematology and Oncology

## 2021-04-02 DIAGNOSIS — C411 Malignant neoplasm of mandible: Secondary | ICD-10-CM | POA: Diagnosis not present

## 2021-04-02 DIAGNOSIS — C060 Malignant neoplasm of cheek mucosa: Secondary | ICD-10-CM | POA: Diagnosis not present

## 2021-04-02 NOTE — Progress Notes (Signed)
Received voicemail from patient regarding [employer].  Advised what is needed to apply. She will bring on Tues 9/27 and call me after her lab to complete grant paperwork.  She has my card for any additional financial questions or concerns.

## 2021-04-05 ENCOUNTER — Encounter: Payer: Self-pay | Admitting: Physical Therapy

## 2021-04-05 ENCOUNTER — Ambulatory Visit
Admission: RE | Admit: 2021-04-05 | Discharge: 2021-04-05 | Disposition: A | Discharge status: Home / Self Care | Payer: Medicare Other | Source: Ambulatory Visit | Attending: Radiation Oncology | Admitting: Radiation Oncology

## 2021-04-05 ENCOUNTER — Other Ambulatory Visit: Payer: Self-pay

## 2021-04-05 ENCOUNTER — Ambulatory Visit: Payer: Medicare Other | Admitting: Physical Therapy

## 2021-04-05 DIAGNOSIS — C411 Malignant neoplasm of mandible: Secondary | ICD-10-CM | POA: Diagnosis not present

## 2021-04-05 DIAGNOSIS — M25612 Stiffness of left shoulder, not elsewhere classified: Secondary | ICD-10-CM

## 2021-04-05 DIAGNOSIS — R471 Dysarthria and anarthria: Secondary | ICD-10-CM | POA: Diagnosis not present

## 2021-04-05 DIAGNOSIS — M25611 Stiffness of right shoulder, not elsewhere classified: Secondary | ICD-10-CM | POA: Diagnosis not present

## 2021-04-05 DIAGNOSIS — R262 Difficulty in walking, not elsewhere classified: Secondary | ICD-10-CM | POA: Diagnosis not present

## 2021-04-05 DIAGNOSIS — C060 Malignant neoplasm of cheek mucosa: Secondary | ICD-10-CM

## 2021-04-05 DIAGNOSIS — Z483 Aftercare following surgery for neoplasm: Secondary | ICD-10-CM

## 2021-04-05 DIAGNOSIS — M6281 Muscle weakness (generalized): Secondary | ICD-10-CM

## 2021-04-05 DIAGNOSIS — I890 Lymphedema, not elsewhere classified: Secondary | ICD-10-CM

## 2021-04-05 DIAGNOSIS — R1311 Dysphagia, oral phase: Secondary | ICD-10-CM | POA: Diagnosis not present

## 2021-04-05 DIAGNOSIS — L599 Disorder of the skin and subcutaneous tissue related to radiation, unspecified: Secondary | ICD-10-CM

## 2021-04-05 NOTE — Therapy (Signed)
[place], [place], [address] Phone: [phone]   Fax:  [phone]  Physical Therapy Treatment  Patient Details  Name: Tammy Brady MRN: [id number] Date of Birth: Jun 28, 1951 Referring Provider (PT): Isidore Moos   Encounter Date: 04/05/2021   PT End of Session - 04/05/21 1058     Visit Number 4    Number of Visits 13    Date for PT Re-Evaluation 04/22/21    PT Start Time 1013   pt arrived late   PT Stop Time 1055    PT Time Calculation (min) 42 min    Activity Tolerance Patient tolerated treatment well    Behavior During Therapy [facility] for tasks assessed/performed             Past Medical History:  Diagnosis Date   CAD (coronary artery disease)    Mild non-obstructive disease by cath January 2011   Carotid artery disease ([place])    Followed by Dr Kellie Simmering   GERD (gastroesophageal reflux disease)    Hyperlipidemia    Hypertension    Hypothyroidism     Past Surgical History:  Procedure Laterality Date   APPENDECTOMY     IR IMAGING GUIDED PORT INSERTION  03/02/2021    There were no vitals filed for this visit.   Subjective Assessment - 04/05/21 1014     Subjective Pt reports she has had 2 recent falls. One she tripped and hit head and got a black eye and the other one she tripped over the dog. I feel unsteady on my feet and I have been using a cane since then.    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 CT neck same day revealed an enhancing soft tissue lesion along the buccal margin of the left mandible; with lucency in the adjacent bone consistent with known SCC. Also found were necrotic left level IA/B and II/IIIB lymph nodes, noted to be concerning for nodal metastasis. During physical exam performed at this visit, Dr. Amada Jupiter visualized the exophytic mass in and around the left mandibular  canine, premolar, and first molar, along with an enlarged level 1A lymph node. The patient opted to proceed with multiple oral biopsies, mandibular resectioning, and lymphadenectomies, 01/18/21 Resection performed on 01/18/21 revealed the following: 3.3cm tumor, Gr 2 Squamous cell carcinoma of alveolar ridge extending to buccal gingival junction and lingual sulcus, DOI 36mm, neg margins, 6/49 LN+ bilaterally with ECE; no PNI or LVSI; She also underwent Left fibula osteocutaneous free flap and ORIF mandible, will receive 30 fractions of radiation to her oral cavity and bilateral neck and 6 weeks of cisplatin chemotherapy. She started on 8/29 and will complete 04/19/21, PEG placed at [facility] on 01/18/21 and exchanged on 8/5 due to leakage. She uses PEG for most nutrition and medication. She is able to tolerate pureed foods but her reconstructive flap limits her swallowing, PAC placed at [facility] on 03/02/21, reports a 40 pack year smoking history, quitting off and on over the years, stopped smoking in May 2022.    Patient Stated Goals to gain info from providers    Currently in Pain? Yes    Pain Score 4     Pain Location Mouth    Pain Descriptors / Indicators Constant    Pain Type Acute pain    Pain Onset 1 to 4 weeks ago    Pain Frequency Constant    Aggravating  Factors  eating    Pain Relieving Factors lidocaine, oxycodone    Effect of Pain on Daily Activities having difficulty swallowing                               [facility] Adult PT Treatment/Exercise - 04/05/21 0001       Manual Therapy   Edema Management cut large TG soft for pt to slide over chip pack so pt is able to launder it      Ankle Exercises: Seated   ABC's 1 rep   on L side only   ABC's Limitations v/c to go slowly    Other Seated Ankle Exercises anlke circles bilaterally x 10 each direction; then with red band the following x 10 reps each, pt returned therapist demo: dorsiflexion, plantar flexion, inversion, eversion                      PT Education - 04/05/21 1100     Education Details FlexiTouch head and neck compression pump - sent demographics and issued handout    Person(s) Educated Patient;Spouse    Methods Explanation;Handout    Comprehension Verbalized understanding                 PT Long Term Goals - 03/11/21 0959       PT LONG TERM GOAL #1   Title Pt will be independent in self MLD for long term management of lymphedema.    Time 6    Period Weeks    Status New    Target Date 04/22/21      PT LONG TERM GOAL #2   Title Pt will obtain appropriate compression garments for long term management of lymphedema.    Time 6    Period Weeks    Status New    Target Date 04/22/21      PT LONG TERM GOAL #3   Title Pt will report she is able to descend steps with a step through gait pattern and a decreased fear of falling.    Baseline currently step to    Time 6    Period Weeks    Status New    Target Date 04/22/21      PT LONG TERM GOAL #4   Title Pt will be independent in a home exercise program for continued stretching and strengthening.    Time 6    Period Weeks    Status New    Target Date 04/22/21      PT LONG TERM GOAL #5   Title Pt will report her shoulder ROM has improved and she is no longer having trouble getting dressed or reaching overhead.    Time 6    Period Weeks    Status New    Target Date 04/22/21      Additional Long Term Goals   Additional Long Term Goals Yes      PT LONG TERM GOAL #6   Title Pt will be able to complete 13 sit to stands in 30 seconds without use of UEs to decrease fall risk.    Baseline 7    Time 6    Period Weeks    Status New    Target Date 04/22/21                   Plan - 04/05/21 1100     Clinical Impression Statement Pt returns to  PT and has had two more recent falls, one in which she fell and hit her head brusing her eye. Pt reports she is feeling more unsteady on her feet and is now using a  straight cane for ambulation. Focused today on creating a home exercise program for ankle strengthening and instructing pt in ankle strengthening exercises to help decrease fall risk. Pt also educated about the FlexiTouch compression pump and pt is very interested in pursuing this.    PT Frequency 2x / week    PT Duration 6 weeks    PT Treatment/Interventions ADLs/Self Care Home Management;Therapeutic exercise;Balance training;Neuromuscular re-education;Stair training;Gait training;Patient/family education;Orthotic Fit/Training;Manual techniques;Manual lymph drainage;Compression bandaging;Scar mobilization;Passive range of motion;Taping;Vasopneumatic Device;Joint Manipulations    PT Next Visit Plan hear from flexi? measure bilat shoulder ROM and update last goal to include measurements- cont MLD monitoring skin, how are ankle exercises?, PROM to neck, give HEP for LE strengthening, once lymphedema is managed begin bilateral shoulder ROM and LE strengthening and balance    PT Home Exercise Plan head and neck ROM exercises,Access Code [email or web address]    Consulted and Agree with Plan of Care Patient;Family member/caregiver             Patient will benefit from skilled therapeutic intervention in order to improve the following deficits and impairments:  Postural dysfunction, Impaired UE functional use, Impaired flexibility, Increased fascial restricitons, Decreased strength, Decreased knowledge of use of DME, Decreased knowledge of precautions, Decreased range of motion, Decreased scar mobility, Increased edema, Difficulty walking, Decreased balance  Visit Diagnosis: Muscle weakness (generalized)  Difficulty in walking, not elsewhere classified  Lymphedema, not elsewhere classified  Stiffness of left shoulder, not elsewhere classified  Stiffness of right shoulder, not elsewhere classified  Disorder of the skin and subcutaneous tissue related to radiation, unspecified  Aftercare following surgery  for neoplasm  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[place])     Problem List Patient Active Problem List   Diagnosis Date Noted   Mucositis due to antineoplastic therapy 03/31/2021   Drug induced constipation 03/31/2021   Chemotherapy-induced nausea 03/31/2021   Port-A-Cath in place 03/09/2021   Carcinoma of lower gum ([place]) 02/20/2021   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[place]) 12/08/2020   Gastritis 12/08/2020   Anxiety 12/08/2020   Need for prophylactic vaccination and inoculation against influenza 07/30/2020   Depression 11/26/2015   GERD (gastroesophageal reflux disease) 11/26/2015   Migraine headache 11/26/2015   Screening for diabetes mellitus 09/03/2015   Hypothyroidism 07/22/2009   Mixed hyperlipidemia 07/22/2009   Tobacco user 07/22/2009   Hypertension, essential 07/22/2009   CAD, NATIVE VESSEL 07/22/2009   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   Coronary artery spasm [facility]) 07/22/2009    Allyson Sabal Swifton, PT 04/05/2021, 11:03 AM  [place], [place], [address] Phone: [phone]   Fax:  [phone]  Name: Tammy Brady MRN: [id number] Date of Birth: 05-13-1951   Manus Gunning, PT 04/05/21 11:03 AM

## 2021-04-05 NOTE — Patient Instructions (Signed)
Access Code: [email or web address] URL: [email or web address] Date: 04/05/2021 Prepared by: Manus Gunning  Exercises Seated Ankle Circles - 1 x daily - 7 x weekly - 1 sets - 10 reps Seated Ankle Alphabet - 1 x daily - 7 x weekly - 1 sets - 1 reps Seated Ankle Plantarflexion with Resistance - 1 x daily - 7 x weekly - 1 sets - 10 reps Seated Ankle Inversion with Anchored Resistance - 1 x daily - 7 x weekly - 1 sets - 10 reps Seated Ankle Eversion with Anchored Resistance - 1 x daily - 7 x weekly - 1 sets - 10 reps Seated Ankle Dorsiflexion with Resistance - 1 x daily - 7 x weekly - 1 sets - 10 reps

## 2021-04-06 ENCOUNTER — Other Ambulatory Visit: Payer: Self-pay | Admitting: Physician Assistant

## 2021-04-06 ENCOUNTER — Ambulatory Visit
Admission: RE | Admit: 2021-04-06 | Discharge: 2021-04-06 | Disposition: A | Discharge status: Home / Self Care | Payer: Medicare Other | Source: Ambulatory Visit | Attending: Radiation Oncology | Admitting: Radiation Oncology

## 2021-04-06 ENCOUNTER — Encounter: Payer: Self-pay | Admitting: Hematology and Oncology

## 2021-04-06 ENCOUNTER — Inpatient Hospital Stay: Payer: Medicare Other

## 2021-04-06 DIAGNOSIS — C411 Malignant neoplasm of mandible: Secondary | ICD-10-CM | POA: Diagnosis not present

## 2021-04-06 DIAGNOSIS — C060 Malignant neoplasm of cheek mucosa: Secondary | ICD-10-CM

## 2021-04-06 DIAGNOSIS — D649 Anemia, unspecified: Secondary | ICD-10-CM | POA: Diagnosis not present

## 2021-04-06 DIAGNOSIS — D701 Agranulocytosis secondary to cancer chemotherapy: Secondary | ICD-10-CM | POA: Diagnosis not present

## 2021-04-06 DIAGNOSIS — K5903 Drug induced constipation: Secondary | ICD-10-CM | POA: Diagnosis not present

## 2021-04-06 DIAGNOSIS — E782 Mixed hyperlipidemia: Secondary | ICD-10-CM

## 2021-04-06 DIAGNOSIS — K1231 Oral mucositis (ulcerative) due to antineoplastic therapy: Secondary | ICD-10-CM | POA: Diagnosis not present

## 2021-04-06 DIAGNOSIS — R634 Abnormal weight loss: Secondary | ICD-10-CM | POA: Diagnosis not present

## 2021-04-06 DIAGNOSIS — D696 Thrombocytopenia, unspecified: Secondary | ICD-10-CM | POA: Diagnosis not present

## 2021-04-06 DIAGNOSIS — Z87891 Personal history of nicotine dependence: Secondary | ICD-10-CM | POA: Diagnosis not present

## 2021-04-06 DIAGNOSIS — T451X5A Adverse effect of antineoplastic and immunosuppressive drugs, initial encounter: Secondary | ICD-10-CM | POA: Diagnosis not present

## 2021-04-06 DIAGNOSIS — R112 Nausea with vomiting, unspecified: Secondary | ICD-10-CM | POA: Diagnosis not present

## 2021-04-06 DIAGNOSIS — Z5111 Encounter for antineoplastic chemotherapy: Secondary | ICD-10-CM | POA: Diagnosis not present

## 2021-04-06 DIAGNOSIS — I10 Essential (primary) hypertension: Secondary | ICD-10-CM

## 2021-04-06 LAB — BASIC METABOLIC PANEL - CANCER CENTER ONLY
Anion gap: 9 (ref 5–15)
BUN: 23 mg/dL (ref 8–23)
CO2: 26 mmol/L (ref 22–32)
Calcium: 8.9 mg/dL (ref 8.9–10.3)
Chloride: 97 mmol/L — ABNORMAL LOW (ref 98–111)
Creatinine: 0.73 mg/dL (ref 0.44–1.00)
GFR, Estimated: >60 mL/min (ref >60)
Glucose, Bld: 104 mg/dL — ABNORMAL HIGH (ref 70–99)
Potassium: 4.3 mmol/L (ref 3.5–5.1)
Sodium: 132 mmol/L — ABNORMAL LOW (ref 135–145)

## 2021-04-06 LAB — CBC WITH DIFFERENTIAL (CANCER CENTER ONLY)
Abs Immature Granulocytes: 0.01 10*3/uL (ref 0.00–0.07)
Basophils Absolute: 0 10*3/uL (ref 0.0–0.1)
Basophils Relative: 1 %
Eosinophils Absolute: 0 10*3/uL (ref 0.0–0.5)
Eosinophils Relative: 0 %
HCT: 33 % — ABNORMAL LOW (ref 36.0–46.0)
Hemoglobin: 10.9 g/dL — ABNORMAL LOW (ref 12.0–15.0)
Immature Granulocytes: 1 %
Lymphocytes Relative: 19 %
Lymphs Abs: 0.3 10*3/uL — ABNORMAL LOW (ref 0.7–4.0)
MCH: 28.5 pg (ref 26.0–34.0)
MCHC: 33 g/dL (ref 30.0–36.0)
MCV: 86.2 fL (ref 80.0–100.0)
Monocytes Absolute: 0.2 10*3/uL (ref 0.1–1.0)
Monocytes Relative: 15 %
Neutro Abs: 1 10*3/uL — ABNORMAL LOW (ref 1.7–7.7)
Neutrophils Relative %: 64 %
Platelet Count: 113 10*3/uL — ABNORMAL LOW (ref 150–400)
RBC: 3.83 MIL/uL — ABNORMAL LOW (ref 3.87–5.11)
RDW: 15.3 % (ref 11.5–15.5)
WBC Count: 1.5 10*3/uL — ABNORMAL LOW (ref 4.0–10.5)
nRBC: 0 % (ref 0.0–0.2)

## 2021-04-06 LAB — MAGNESIUM: Magnesium: 1.7 mg/dL (ref 1.7–2.4)

## 2021-04-06 NOTE — Progress Notes (Signed)
Patient forgot to bring income for [employer].  Advised she may bring next visit which is Wed 04/07/21. She verbalized understanding.

## 2021-04-07 ENCOUNTER — Ambulatory Visit
Admission: RE | Admit: 2021-04-07 | Discharge: 2021-04-07 | Disposition: A | Discharge status: Home / Self Care | Payer: Medicare Other | Source: Ambulatory Visit | Attending: Radiation Oncology | Admitting: Radiation Oncology

## 2021-04-07 ENCOUNTER — Inpatient Hospital Stay: Payer: Medicare Other | Admitting: Dietician

## 2021-04-07 ENCOUNTER — Encounter: Payer: Self-pay | Admitting: Hematology and Oncology

## 2021-04-07 ENCOUNTER — Inpatient Hospital Stay: Payer: Medicare Other | Admitting: Hematology and Oncology

## 2021-04-07 ENCOUNTER — Other Ambulatory Visit: Payer: Medicare Other

## 2021-04-07 ENCOUNTER — Other Ambulatory Visit: Payer: Self-pay

## 2021-04-07 ENCOUNTER — Inpatient Hospital Stay: Payer: Medicare Other

## 2021-04-07 DIAGNOSIS — K5903 Drug induced constipation: Secondary | ICD-10-CM | POA: Diagnosis not present

## 2021-04-07 DIAGNOSIS — D649 Anemia, unspecified: Secondary | ICD-10-CM | POA: Diagnosis not present

## 2021-04-07 DIAGNOSIS — D696 Thrombocytopenia, unspecified: Secondary | ICD-10-CM | POA: Diagnosis not present

## 2021-04-07 DIAGNOSIS — R079 Chest pain, unspecified: Secondary | ICD-10-CM | POA: Diagnosis not present

## 2021-04-07 DIAGNOSIS — K1231 Oral mucositis (ulcerative) due to antineoplastic therapy: Secondary | ICD-10-CM | POA: Diagnosis not present

## 2021-04-07 DIAGNOSIS — Z5111 Encounter for antineoplastic chemotherapy: Secondary | ICD-10-CM | POA: Diagnosis not present

## 2021-04-07 DIAGNOSIS — D701 Agranulocytosis secondary to cancer chemotherapy: Secondary | ICD-10-CM | POA: Insufficient documentation

## 2021-04-07 DIAGNOSIS — Z87891 Personal history of nicotine dependence: Secondary | ICD-10-CM | POA: Diagnosis not present

## 2021-04-07 DIAGNOSIS — R112 Nausea with vomiting, unspecified: Secondary | ICD-10-CM | POA: Diagnosis not present

## 2021-04-07 DIAGNOSIS — T451X5A Adverse effect of antineoplastic and immunosuppressive drugs, initial encounter: Secondary | ICD-10-CM

## 2021-04-07 DIAGNOSIS — C060 Malignant neoplasm of cheek mucosa: Secondary | ICD-10-CM

## 2021-04-07 DIAGNOSIS — R634 Abnormal weight loss: Secondary | ICD-10-CM | POA: Diagnosis not present

## 2021-04-07 DIAGNOSIS — C411 Malignant neoplasm of mandible: Secondary | ICD-10-CM | POA: Diagnosis not present

## 2021-04-07 MED ORDER — SODIUM CHLORIDE 0.9 % IV SOLN
40.0000 mg/m2 | Freq: Once | INTRAVENOUS | Status: AC
  Administered 2021-04-07: 74 mg via INTRAVENOUS
  Filled 2021-04-07: qty 74

## 2021-04-07 MED ORDER — SODIUM CHLORIDE 0.9 % IV SOLN
150.0000 mg | Freq: Once | INTRAVENOUS | Status: AC
  Administered 2021-04-07: 150 mg via INTRAVENOUS
  Filled 2021-04-07: qty 150

## 2021-04-07 MED ORDER — SODIUM CHLORIDE 0.9% FLUSH
10.0000 mL | INTRAVENOUS | Status: DC | PRN
  Administered 2021-04-07: 10 mL

## 2021-04-07 MED ORDER — PALONOSETRON HCL INJECTION 0.25 MG/5ML
0.2500 mg | Freq: Once | INTRAVENOUS | Status: AC
  Administered 2021-04-07: 0.25 mg via INTRAVENOUS
  Filled 2021-04-07: qty 5

## 2021-04-07 MED ORDER — HEPARIN SOD (PORK) LOCK FLUSH 100 UNIT/ML IV SOLN
500.0000 [IU] | Freq: Once | INTRAVENOUS | Status: AC | PRN
  Administered 2021-04-07: 500 [IU]

## 2021-04-07 MED ORDER — MAGNESIUM SULFATE 2 GM/50ML IV SOLN
2.0000 g | Freq: Once | INTRAVENOUS | Status: AC
  Administered 2021-04-07: 2 g via INTRAVENOUS
  Filled 2021-04-07: qty 50

## 2021-04-07 MED ORDER — SODIUM CHLORIDE 0.9 % IV SOLN: Freq: Once | INTRAVENOUS | Status: AC

## 2021-04-07 MED ORDER — SODIUM CHLORIDE 0.9 % IV SOLN
10.0000 mg | Freq: Once | INTRAVENOUS | Status: AC
  Administered 2021-04-07: 10 mg via INTRAVENOUS
  Filled 2021-04-07: qty 10

## 2021-04-07 MED ORDER — POTASSIUM CHLORIDE IN NACL 20-0.9 MEQ/L-% IV SOLN
INTRAVENOUS | Status: DC
  Filled 2021-04-07 (×2): qty 1000

## 2021-04-07 NOTE — Assessment & Plan Note (Signed)
Continue magic mouth wash, baking soda rinses and oxycodone as needed. We will continue to monitor.

## 2021-04-07 NOTE — Research (Signed)
Per Dr Chryl Heck, ok to treat with ANC 1.0

## 2021-04-07 NOTE — Progress Notes (Signed)
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  Her pathologic staging is pT3 N3B. Given positive margins and presence of extracapsular extension, we agreed with adjuvant chemoradiation. She completed 4 weekly cycles of cisplatin concurrent with radiation. ROS pertinent for mucositis, otherwise no dose limiting toxicity noted. Okay to proceed with labs today  Chest pain of uncertain etiology She describes chest pain and pain between her shoulder blades. No typical symptoms concerning for cardiovascular etiology However given women can have atypical symptoms, I have asked her to contact her cardiologist right away about these symptoms. I dont believe these are necessarily related to ongoing treatment. She expressed understanding of the plan.  Mucositis due to antineoplastic therapy Continue magic mouth wash, baking soda rinses and oxycodone as needed. We will continue to monitor.  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 chemotherapy (HCC) Leukopenia and neutropenia noted today ANC of 1000, ok to proceed with chemotherapy Discussed neutropenic precautions and to contact us immediately with any signs or concerns  of infection.   HISTORY OF PRESENTING ILLNESS:   Tammy Brady 70 y.o. female is here because of  SCC oral mucosa  This is a pleasant 70 year old female patient who first presented to her dentist with a painful lesion to her gums noticed more like at the end of 2021.  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  Subsequently she was referred to Dr. Amada Jupiter on December 14, 2020 who discussed surgical intervention with patient.  She had neck CT which showed an enhancing soft tissue lesion along the buccal margin of left mandible with a lucency in the adjacent bone consistent with known SCC.  She was also found to have necrotic left level 1A/PN2/3B lymph nodes noted to be concerning for nodal metastasis.  She underwent resection on January 18, 2021 which revealed 3.3 cm tumor, grade 2 squamous cell carcinoma of the alveolar ridge extending to buccal gingival junction and lingual sulcus, depth of invasion 11 mm, negative margins, 6 out of 49 lymph nodes positive bilaterally with extracapsular extension, no perineural invasion or lymphovascular invasion.  She is now referred to medical oncology for consideration of adjuvant chemoradiation.  PATHOLOGY REPORT: Diagnosis     A: Mouth, RMT mucosal margin, excision - Benign squamous mucosa and submucosa with no tumor seen.   B: Mouth, left buccal margin, excision - Benign squamous mucosa and submucosa with no tumor seen.   C: Mouth, labial mucosal margin, excision - Benign squamous mucosa and submucosa with no tumor seen.   D: Mouth, anterior gingival mucosal margin, excision - Benign squamous mucosa and submucosa with no tumor seen.   E: Mouth, floor of mouth, biopsy - Benign squamous mucosa and submucosa with no tumor seen.   F: Tongue, ventral tongue,  biopsy - Benign squamous mucosa and submucosa with no tumor seen.   G: Bone marrow, anterior mandible, biopsy - Fragment of blood clot, no tumor  seen, on original frozen section only, tissue has been cut through on permanent sections.   H: Bone marrow, posterior mandible, biopsy - Fragment of blood clot, no tumor seen, on original frozen section only, tissue has been cut through on permanent sections.   I: Lymph node, left neck level 1B and level 1A, lymphadenectomy - Metastatic squamous cell carcinoma with cystic change, involving 2 of 4 lymph nodes (2/4), size of largest metastasis 2.2 cm. - Benign submandibular gland is also present.   J: Lymph node, right level 1B, lymphadenectomy - Metastatic squamous cell carcinoma involving 2 of 4 lymph nodes (2/4), with cystic change, size of largest metastasis 2.0 cm in greatest dimension, with extranodal extension of tumor identified. - Submandibular gland present with no tumor seen.   K: Lymph node, left EJ, excision - No tumor seen in three lymph nodes (0/3).   L: Lymph node, left neck, level 2, lymphadenectomy - No tumor seen in 14 lymph nodes (0/14).   M: Lymph node, left neck, level 3, lymphadenectomy - Metastatic squamous cell carcinoma involving 1 of 7 lymph nodes (1/7), size of metastasis 2.2 cm, with extranodal extension of tumor identified.   N: Lymph node, left neck, level 4, lymphadenectomy - Metastatic squamous cell carcinoma involving 1 of 2 lymph nodes (1/2), size of metastasis 1 mm.   O: Lymph node, right neck, level 2, lymphadenectomy - No tumor seen in eight lymph nodes (0/8).   P: Lymph node, right neck, level 3, lymphadenectomy - No tumor seen in four lymph nodes (0/4).   Q: Lymph node, right neck, level 4, lymphadenectomy - No tumor seen in three lymph nodes (0/3).   R: Left composite mandibular resection (segmental mandibulectomy and resection of submental gland) - Squamous cell carcinoma, moderately differentiated, of alveolar ridge extending to buccal gingival junction and lingual sulcus. - Tumor size 3.3 cm in greatest lateral dimension, depth of invasion  1.1 cm. - Tumor involves the alveolar bone and does not exceed the depth of the roots of the teeth, and does not extend into the body of the mandible.  - Surgical margins free of tumor (soft tissue and bone margins all negative for tumor) - Submental gland present with no tumor seen.   S: Teeth, extraction - Multiple intact and fragmented teeth (gross examination only).     Given positive margin and extracapsular extension, we have discussed about adjuvant chemoradiation with weekly cisplatin.  Started weekly cisplatin on 03/10/2021. Cycle 2-day 1 on 03/17/2021 Cycle 3-day 1 on 03/24/2021. Cycle 4 day 1 on 03/31/2021  Interval history   Patient is here for follow-up prior to cycle 5 of cisplatin. She fell twice, tripped on her dog once and tripped on her sewing machine the second time,  She complained of chest pain at night, happened couple times a week, not associated with chest pressure. She tripped on her sewing machine and noticed chest pain after her fall. Chest pain not associated with exertion or heavy meal.  She has history of vasospastic angina. She also felt back pain in between shoulder blades especially when she stands or walk more than 5 minutes. She feels a bit unsteady on feet, using a walker. No nausea or vomiting.  Mouth pain is tolerable with lidocaine, baking soda, and oxycodone at night. All feeding through G tube as well as hydration through  G tube. No change in bowel habits or urinary habits No new neurological complaints. No change in hearing or neuropathy. Rest of the pertinent 10 point ROS reviewed and negative.   MEDICAL HISTORY:  Past Medical History:  Diagnosis Date   CAD (coronary artery disease)    Mild non-obstructive disease by cath January 2011   Carotid artery disease (HCC)    Followed by Dr Kellie Simmering   GERD (gastroesophageal reflux disease)    Hyperlipidemia    Hypertension    Hypothyroidism     SURGICAL HISTORY: Past Surgical History:   Procedure Laterality Date   APPENDECTOMY     IR IMAGING GUIDED PORT INSERTION  03/02/2021    SOCIAL HISTORY: Social History   Socioeconomic History   Marital status: Married    Spouse name: Not on file   Number of children: Not on file   Years of education: Not on file   Highest education level: Not on file  Occupational History   Occupation: Field seismologist: [employer]  Tobacco Use   Smoking status: Former    Packs/day: 1.00    Years: 40.00    Pack years: 40.00    Types: Cigarettes    Quit date: 05/11/2009    Years since quitting: 11.9   Smokeless tobacco: Never  Substance and Sexual Activity   Alcohol use: Not Currently   Drug use: No   Sexual activity: Not Currently  Other Topics Concern   Not on file  Social History Narrative   Not on file   Social Determinants of Health   Financial Resource Strain: Low Risk    Difficulty of Paying Living Expenses: Not hard at all  Food Insecurity: No Food Insecurity   Worried About Charity fundraiser in the Last Year: Never true   [place] in the Last Year: Never true  Transportation Needs: No Transportation Needs   Lack of Transportation (Medical): No   Lack of Transportation (Non-Medical): No  Physical Activity: Not on file  Stress: No Stress Concern Present   Feeling of Stress : Only a little  Social Connections: Engineer, building services of Communication with Friends and Family: More than three times a week   Frequency of Social Gatherings with Friends and Family: More than three times a week   Attends Religious Services: More than 4 times per year   Active Member of [employer] or Organizations: Yes   Attends Music therapist: More than 4 times per year   Marital Status: Married  Human resources officer Violence: Not on file    FAMILY HISTORY: Family History  Problem Relation Age of Onset   Cancer Mother    Coronary artery disease Father    Coronary artery disease Maternal Grandmother      ALLERGIES:  is allergic to codeine.  MEDICATIONS:  Current Outpatient Medications  Medication Sig Dispense Refill   acetaminophen (TYLENOL) 500 MG tablet Take 500 mg by mouth every 6 (six) hours as needed.     aspirin 325 MG tablet Take 650 mg by mouth as needed for mild pain or moderate pain.     calcium carbonate (OS-CAL) 600 MG TABS Take 600 mg by mouth daily.     dexamethasone (DECADRON) 4 MG tablet Take 2 tablets (8 mg total) by mouth daily. Take daily x 3 days starting the day after cisplatin chemotherapy. Take with food. 30 tablet 1   diltiazem (CARDIZEM CD) 360 MG 24 hr capsule TAKE 1 CAPSULE(360  MG) BY MOUTH DAILY 90 capsule 0   gabapentin (NEURONTIN) 300 MG capsule Take 1 capsule by mouth 3 (three) times daily.     ibuprofen (ADVIL) 600 MG tablet Take 1 tablet by mouth every 6 (six) hours as needed.     levothyroxine (SYNTHROID) 112 MCG tablet TAKE 1 TABLET BY MOUTH DAILY 90 tablet 0   lidocaine (XYLOCAINE) 2 % solution Patient: Mix 1part 2% viscous lidocaine, 1part H20. Swish & swallow 53mL of diluted mixture, 67min before meals and at bedtime, up to QID 200 mL 4   lidocaine-prilocaine (EMLA) cream Apply to affected area once 30 g 3   Multiple Vitamin (MULTIVITAMIN) tablet Take 1 tablet by mouth daily.     nitroGLYCERIN (NITROSTAT) 0.4 MG SL tablet Place 1 tablet (0.4 mg total) under the tongue every 5 (five) minutes as needed. 25 tablet 6   Nutritional Supplements (FEEDING SUPPLEMENT, OSMOLITE 1.5 CAL,) LIQD Place into feeding tube daily. 4-5 cartons daily     omeprazole (PRILOSEC) 20 MG capsule Take 1 capsule (20 mg total) by mouth daily. 30 capsule 1   ondansetron (ZOFRAN) 8 MG tablet Take 1 tablet (8 mg total) by mouth 2 (two) times daily as needed. Start on the third day after cisplatin chemotherapy. 30 tablet 1   oxyCODONE (OXY IR/ROXICODONE) 5 MG immediate release tablet Take 1 tablet (5 mg total) by mouth every 6 (six) hours as needed for severe pain. 30 tablet 0    Polyethylene Glycol 3350 (MIRALAX PO) Take by mouth as needed.     prochlorperazine (COMPAZINE) 10 MG tablet Take 1 tablet (10 mg total) by mouth every 6 (six) hours as needed (Nausea or vomiting). 30 tablet 1   simvastatin (ZOCOR) 40 MG tablet TAKE 1 TABLET(40 MG) BY MOUTH AT BEDTIME 90 tablet 0   traZODone (DESYREL) 50 MG tablet Take 1 tablet (50 mg total) by mouth at bedtime. 90 tablet 1   No current facility-administered medications for this visit.   Facility-Administered Medications Ordered in Other Visits  Medication Dose Route Frequency Provider Last Rate Last Admin   0.9 % NaCl with KCl 20 mEq/ L  infusion   Intravenous Continuous Hardy Harcum, MD 250 mL/hr at 04/07/21 0946 New Bag at 04/07/21 0946   CISplatin (PLATINOL) 74 mg in sodium chloride 0.9 % 250 mL chemo infusion  40 mg/m2 (Treatment Plan Recorded) Intravenous Once Estil Vallee, Arletha Pili, MD       dexamethasone (DECADRON) 10 mg in sodium chloride 0.9 % 50 mL IVPB  10 mg Intravenous Once Dayelin Balducci, MD       fosaprepitant (EMEND) 150 mg in sodium chloride 0.9 % 145 mL IVPB  150 mg Intravenous Once Kuper Rennels, MD       heparin lock flush 100 unit/mL  500 Units Intracatheter Once PRN Dakwan Pridgen, MD       magnesium sulfate IVPB 2 g 50 mL  2 g Intravenous Once Andrena Margerum, Arletha Pili, MD 50 mL/hr at 04/07/21 0950 2 g at 04/07/21 0950   palonosetron (ALOXI) injection 0.25 mg  0.25 mg Intravenous Once Lulu Hirschmann, MD       sodium chloride flush (NS) 0.9 % injection 10 mL  10 mL Intracatheter PRN Trevione Wert, Arletha Pili, MD        PHYSICAL EXAMINATION:  ECOG PERFORMANCE STATUS: 0 - Asymptomatic  Vitals:   04/07/21 0839  BP: (!) 128/44  Pulse: 73  Resp: 19  Temp: 98 F (36.7 C)  SpO2: 98%   Physical Exam Constitutional:  Appearance: Normal appearance.  HENT:     Head: Normocephalic and atraumatic.     Mouth/Throat:     Comments: Mouth ulcers noted. Cardiovascular:     Rate and Rhythm: Normal rate and regular  rhythm.     Pulses: Normal pulses.     Heart sounds: Normal heart sounds.  Pulmonary:     Effort: Pulmonary effort is normal.     Breath sounds: Normal breath sounds.  Abdominal:     General: Abdomen is flat.     Palpations: Abdomen is soft.     Comments: G-tube appears well. There is discharge around G tube, not purulent, some skin irritation noted.   Musculoskeletal:        General: No swelling or tenderness.     Cervical back: Normal range of motion and neck supple. No rigidity.  Lymphadenopathy:     Cervical: No cervical adenopathy.  Skin:    General: Skin is warm and dry.  Neurological:     General: No focal deficit present.     Mental Status: She is alert.  Psychiatric:        Mood and Affect: Mood normal.    LABORATORY DATA:  I have reviewed the data as listed Lab Results  Component Value Date   WBC 1.5 (L) 04/06/2021   HGB 10.9 (L) 04/06/2021   HCT 33.0 (L) 04/06/2021   MCV 86.2 04/06/2021   PLT 113 (L) 04/06/2021     Chemistry      Component Value Date/Time   NA 132 (L) 04/06/2021 1036   NA 142 12/08/2020 1356   K 4.3 04/06/2021 1036   CL 97 (L) 04/06/2021 1036   CO2 26 04/06/2021 1036   BUN 23 04/06/2021 1036   BUN 15 12/08/2020 1356   CREATININE 0.73 04/06/2021 1036      Component Value Date/Time   CALCIUM 8.9 04/06/2021 1036   ALKPHOS 84 12/08/2020 1356   AST 39 12/08/2020 1356   ALT 24 12/08/2020 1356   BILITOT <0.2 12/08/2020 1356    Have reviewed pertinent labs, Leukopenia, ANC of 1000 Ok to proceed.  RADIOGRAPHIC STUDIES: I have personally reviewed the radiological images as listed and agreed with the findings in the report. No results found.  All questions were answered. The patient knows to call the clinic with any problems, questions or concerns. I spent 30 minutes in the care of this patient including H and P, review of records, counseling and coordination of care.    Benay Pike, MD 04/07/2021 9:52 AM

## 2021-04-07 NOTE — Assessment & Plan Note (Signed)
She describes chest pain and pain between her shoulder blades. No typical symptoms concerning for cardiovascular etiology However given women can have atypical symptoms, I have asked her to contact her cardiologist right away about these symptoms. I dont believe these are necessarily related to ongoing treatment. She expressed understanding of the plan.

## 2021-04-07 NOTE — Assessment & Plan Note (Signed)
Leukopenia and neutropenia noted today ANC of 1000, ok to proceed with chemotherapy Discussed neutropenic precautions and to contact us immediately with any signs or concerns of infection.

## 2021-04-07 NOTE — Assessment & Plan Note (Signed)
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  Her pathologic staging is pT3 N3B. Given positive margins and presence of extracapsular extension, we agreed with adjuvant chemoradiation. She completed 4 weekly cycles of cisplatin concurrent with radiation. ROS pertinent for mucositis, otherwise no dose limiting toxicity noted. Okay to proceed with labs today

## 2021-04-07 NOTE — Progress Notes (Signed)
Nutrition Follow-up:  Patient receiving concurrent adjuvant chemoradiation with weekly cisplatin for SCC of oral cavity. S/p G-tube replacement at [facility] 8/5.  Met with patient during infusion. She reports tripping over her sewing machine the other day, landing on the hardwood floor. She is bruised around her left eye. Patient reports ongoing mucositis, she is taking sips of water and sprite by mouth. Patient is using lidocaine mouth rinse as well as baking soda salt water rinses several times daily. Patient has increased tube feeding to 6 cartons daily. She is giving 1 1/2 cartons QID. Patient reports flushing tube with 120 ml before and 180 ml after each feeding. Patient reports sometimes feeling nauseas after feeding, says she has to remain still to avoid vomiting. Patient reports daily bowel movements, she is taking 1-2 stool softeners. Patient has an appointment at [facility] on Friday to look at her tube. She reports soreness around tube and increased drainage. She is changing her bandage twice/day, noted slight redness around bumper.   Medications: reviewed  Labs: 9/27 - Na 132, Glucose 104  Anthropometrics: Weight 160 lb today increased from 157 lb on 9/21 and 158 lb on 9/14  9/7 - 161 lb 7 oz 8/30 - 164 lb   NUTRITION DIAGNOSIS: Inadequate oral intake ongoing, pt relying on tube feedings   INTERVENTION:  Continue 6 cartons Osmolite 1.5 via tube split over 4 feedings daily with 90 ml water flush before and after feedings Patient will give additional 2 cups water flushes in between feeds to aid with abdominal fullness Continue Fairlife Shakes as tolerated  Continue baking soda, salt water rinses several times daily Continue Lidocaine mouth rinse Discussed strategies for dry mouth  Continue daily stool softener for constipation Continue nausea medication as needed Provided 3 packs of split guaze to patient as she forgot to bring these with her today Provided pt pair of mesh underwear to aid  with holding tube in place without use of tape Patient has contact information    MONITORING, EVALUATION, GOAL: weight trends, intake, tube feedings   NEXT VISIT: Wednesday, October 5 during infusion

## 2021-04-08 ENCOUNTER — Other Ambulatory Visit: Payer: Self-pay

## 2021-04-08 ENCOUNTER — Ambulatory Visit
Admission: RE | Admit: 2021-04-08 | Discharge: 2021-04-08 | Disposition: A | Discharge status: Home / Self Care | Payer: Medicare Other | Source: Ambulatory Visit | Attending: Radiation Oncology | Admitting: Radiation Oncology

## 2021-04-08 ENCOUNTER — Telehealth: Payer: Self-pay | Admitting: Cardiology

## 2021-04-08 ENCOUNTER — Ambulatory Visit: Payer: Medicare Other | Attending: Radiation Oncology | Admitting: Physical Therapy

## 2021-04-08 ENCOUNTER — Encounter: Payer: Self-pay | Admitting: Physical Therapy

## 2021-04-08 DIAGNOSIS — Z483 Aftercare following surgery for neoplasm: Secondary | ICD-10-CM | POA: Insufficient documentation

## 2021-04-08 DIAGNOSIS — R262 Difficulty in walking, not elsewhere classified: Secondary | ICD-10-CM | POA: Diagnosis not present

## 2021-04-08 DIAGNOSIS — M6281 Muscle weakness (generalized): Secondary | ICD-10-CM | POA: Insufficient documentation

## 2021-04-08 DIAGNOSIS — C060 Malignant neoplasm of cheek mucosa: Secondary | ICD-10-CM | POA: Diagnosis not present

## 2021-04-08 DIAGNOSIS — M25612 Stiffness of left shoulder, not elsewhere classified: Secondary | ICD-10-CM | POA: Insufficient documentation

## 2021-04-08 DIAGNOSIS — L599 Disorder of the skin and subcutaneous tissue related to radiation, unspecified: Secondary | ICD-10-CM | POA: Diagnosis not present

## 2021-04-08 DIAGNOSIS — M25611 Stiffness of right shoulder, not elsewhere classified: Secondary | ICD-10-CM | POA: Diagnosis not present

## 2021-04-08 DIAGNOSIS — C411 Malignant neoplasm of mandible: Secondary | ICD-10-CM | POA: Diagnosis not present

## 2021-04-08 DIAGNOSIS — I890 Lymphedema, not elsewhere classified: Secondary | ICD-10-CM | POA: Diagnosis not present

## 2021-04-08 DIAGNOSIS — R072 Precordial pain: Secondary | ICD-10-CM

## 2021-04-08 NOTE — Therapy (Signed)
[place], [place], [address] Phone: [phone]   Fax:  [phone]  Physical Therapy Treatment  Patient Details  Name: Tammy Brady MRN: [id number] Date of Birth: 04/25/1951 Referring Provider (PT): Reita May Date: 04/08/2021   PT End of Session - 04/08/21 0854     Visit Number 5    Number of Visits 13    Date for PT Re-Evaluation 04/22/21    PT Start Time 0803    PT Stop Time 0853    PT Time Calculation (min) 50 min    Activity Tolerance Patient tolerated treatment well    Behavior During Therapy [facility] for tasks assessed/performed             Past Medical History:  Diagnosis Date   CAD (coronary artery disease)    Mild non-obstructive disease by cath January 2011   Carotid artery disease ([place])    Followed by Dr Kellie Simmering   GERD (gastroesophageal reflux disease)    Hyperlipidemia    Hypertension    Hypothyroidism     Past Surgical History:  Procedure Laterality Date   APPENDECTOMY     IR IMAGING GUIDED PORT INSERTION  03/02/2021    There were no vitals filed for this visit.   Subjective Assessment - 04/08/21 0805     Subjective I have been trying to do the ankle exercises. Today I would like to focus on the head and neck massage.    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 CT neck same day revealed an enhancing soft tissue lesion along the buccal margin of the left mandible; with lucency in the adjacent bone consistent with known SCC. Also found were necrotic left level IA/B and II/IIIB lymph nodes, noted to be concerning for nodal metastasis. During physical exam performed at this visit, Dr. Amada Jupiter visualized the exophytic mass in and around the left mandibular canine, premolar, and first molar, along with an enlarged level 1A lymph node. The patient opted to proceed with  multiple oral biopsies, mandibular resectioning, and lymphadenectomies, 01/18/21 Resection performed on 01/18/21 revealed the following: 3.3cm tumor, Gr 2 Squamous cell carcinoma of alveolar ridge extending to buccal gingival junction and lingual sulcus, DOI 40mm, neg margins, 6/49 LN+ bilaterally with ECE; no PNI or LVSI; She also underwent Left fibula osteocutaneous free flap and ORIF mandible, will receive 30 fractions of radiation to her oral cavity and bilateral neck and 6 weeks of cisplatin chemotherapy. She started on 8/29 and will complete 04/19/21, PEG placed at [facility] on 01/18/21 and exchanged on 8/5 due to leakage. She uses PEG for most nutrition and medication. She is able to tolerate pureed foods but her reconstructive flap limits her swallowing, PAC [facility] at [facility] on 03/02/21, reports a 40 pack year smoking history, quitting off and on over the years, stopped smoking in May 2022.    Patient Stated Goals to gain info from providers    Currently in Pain? No/denies    Pain Score 0-No pain                               [facility] Adult PT Treatment/Exercise - 04/08/21 0001       Manual Therapy   Manual Lymphatic Drainage (MLD) in supine with head of bed elevated: short neck, 5 diaphragmatic breaths, bilateral axillary nodes, bilateral  pectoral nodes, anterior chest, short neck, posterior, lateral and anterior neck moving fluid towards pathways, submental and submandibular areas moving fluid towards pathways then retracing all steps while instructing pt in correct skin stretch and sequence                          PT Long Term Goals - 03/11/21 0959       PT LONG TERM GOAL #1   Title Pt will be independent in self MLD for long term management of lymphedema.    Time 6    Period Weeks    Status New    Target Date 04/22/21      PT LONG TERM GOAL #2   Title Pt will obtain appropriate compression garments for long term management of lymphedema.    Time 6     Period Weeks    Status New    Target Date 04/22/21      PT LONG TERM GOAL #3   Title Pt will report she is able to descend steps with a step through gait pattern and a decreased fear of falling.    Baseline currently step to    Time 6    Period Weeks    Status New    Target Date 04/22/21      PT LONG TERM GOAL #4   Title Pt will be independent in a home exercise program for continued stretching and strengthening.    Time 6    Period Weeks    Status New    Target Date 04/22/21      PT LONG TERM GOAL #5   Title Pt will report her shoulder ROM has improved and she is no longer having trouble getting dressed or reaching overhead.    Time 6    Period Weeks    Status New    Target Date 04/22/21      Additional Long Term Goals   Additional Long Term Goals Yes      PT LONG TERM GOAL #6   Title Pt will be able to complete 13 sit to stands in 30 seconds without use of UEs to decrease fall risk.    Baseline 7    Time 6    Period Weeks    Status New    Target Date 04/22/21                   Plan - 04/08/21 0855     Clinical Impression Statement Continued with MLD to neck to help decrease edema and fibrosis. Pt has 8 more radiation sessions remaining. Pt is eager to get the FlexiTouch compression pump to begin using at home. She has been attempting self MLD. Her anterior neck was notably more soft following MLD today. Educated pt throughout on correct skin stretch technique and sequence.    PT Frequency 2x / week    PT Duration 6 weeks    PT Treatment/Interventions ADLs/Self Care Home Management;Therapeutic exercise;Balance training;Neuromuscular re-education;Stair training;Gait training;Patient/family education;Orthotic Fit/Training;Manual techniques;Manual lymph drainage;Compression bandaging;Scar mobilization;Passive range of motion;Taping;Vasopneumatic Device;Joint Manipulations    PT Next Visit Plan hear from flexi? measure bilat shoulder ROM and update last goal to  include measurements- cont MLD monitoring skin, how are ankle exercises?, PROM to neck, give HEP for LE strengthening, once lymphedema is managed begin bilateral shoulder ROM and LE strengthening and balance    PT Home Exercise Plan head and neck ROM exercises,Access Code [email or web address]    Consulted  and Agree with Plan of Care Patient;Family member/caregiver             Patient will benefit from skilled therapeutic intervention in order to improve the following deficits and impairments:  Postural dysfunction, Impaired UE functional use, Impaired flexibility, Increased fascial restricitons, Decreased strength, Decreased knowledge of use of DME, Decreased knowledge of precautions, Decreased range of motion, Decreased scar mobility, Increased edema, Difficulty walking, Decreased balance  Visit Diagnosis: Lymphedema, not elsewhere classified  Disorder of the skin and subcutaneous tissue related to radiation, unspecified  Aftercare following surgery for neoplasm  Muscle weakness (generalized)  Difficulty in walking, not elsewhere classified  Stiffness of left shoulder, not elsewhere classified  Stiffness of right shoulder, not elsewhere classified  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[place])     Problem List Patient Active Problem List   Diagnosis Date Noted   Chest pain of uncertain etiology 16/55/3748   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 chemotherapy ([place]) 04/07/2021   Mucositis due to antineoplastic therapy 03/31/2021   Drug induced constipation 03/31/2021   Chemotherapy-induced nausea 03/31/2021   Port-A-Cath in place 03/09/2021   Carcinoma of lower gum ([place]) 02/20/2021   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[place]) 12/08/2020   Gastritis 12/08/2020   Anxiety 12/08/2020   Need for prophylactic vaccination and inoculation against influenza 07/30/2020   Depression 11/26/2015   GERD (gastroesophageal reflux disease) 11/26/2015   Migraine headache 11/26/2015   Screening for diabetes  mellitus 09/03/2015   Hypothyroidism 07/22/2009   Mixed hyperlipidemia 07/22/2009   Tobacco user 07/22/2009   Hypertension, essential 07/22/2009   CAD, NATIVE VESSEL 07/22/2009   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   Coronary artery spasm [facility]) 07/22/2009    Allyson Sabal [place], PT 04/08/2021, 8:57 AM  [place], [place], [address] Phone: [phone]   Fax:  [phone]  Name: ANAYA BOVEE MRN: [id number] Date of Birth: [birth year]   Manus Gunning, PT 04/08/21 8:57 AM

## 2021-04-08 NOTE — Telephone Encounter (Signed)
Spoke with the patient who reports that over the past week she has had two episodes of chest pain. She states that pain occurred at rest and resolved on its own after 15 minutes. She has not taken any nitroglycerin as she thinks it has expired. She also had recent surgery for cancer in her jaw so she does not think that she would be able to but nitro under her tongue. Advised that they make a nitro spray that we may need to get for her.  Patient reports that pain feels like a squeezing in her chest. She does have some pain that goes into her jaw as well. She has no associated symptoms such as SOB, dizziness, N/V, or diaphoresis. Patient has been undergoing chemo and radiation for the past couple of months and has been under a lot of stress.  She reports that her blood pressure has been good around 120s/50s.  She also reports that she has been having some back pain that occurs after standing for long periods and is not associated with her episodes of chest pain. Reports recent falls - advised to follow up with PCP in case she has injured her back due to falls.  Patient has been advised to go to the ER if chest pain returns.

## 2021-04-08 NOTE — Telephone Encounter (Signed)
Pt c/o of Chest Pain: STAT if CP now or developed within 24 hours  1. Are you having CP right now?  No   2. Are you experiencing any other symptoms (ex. SOB, nausea, vomiting, sweating)?  Back pain   3. How long have you been experiencing CP?  About 1 week  4. Is your CP continuous or coming and going?  Coming and going, occurred a few times over the past week. Lasts about 15 minutes  5. Have you taken Nitroglycerin?  Yes  ?

## 2021-04-09 ENCOUNTER — Ambulatory Visit
Admission: RE | Admit: 2021-04-09 | Discharge: 2021-04-09 | Disposition: A | Discharge status: Home / Self Care | Payer: Medicare Other | Source: Ambulatory Visit | Attending: Radiation Oncology | Admitting: Radiation Oncology

## 2021-04-09 DIAGNOSIS — C411 Malignant neoplasm of mandible: Secondary | ICD-10-CM | POA: Diagnosis not present

## 2021-04-09 DIAGNOSIS — C060 Malignant neoplasm of cheek mucosa: Secondary | ICD-10-CM | POA: Diagnosis not present

## 2021-04-09 DIAGNOSIS — Z4659 Encounter for fitting and adjustment of other gastrointestinal appliance and device: Secondary | ICD-10-CM | POA: Diagnosis not present

## 2021-04-09 DIAGNOSIS — Z09 Encounter for follow-up examination after completed treatment for conditions other than malignant neoplasm: Secondary | ICD-10-CM | POA: Diagnosis not present

## 2021-04-09 MED ORDER — METOPROLOL TARTRATE 50 MG PO TABS: 50.0000 mg | ORAL_TABLET | Freq: Once | ORAL | 0 refills | Status: DC

## 2021-04-09 NOTE — Telephone Encounter (Signed)
Spoke with the patient and advised her on recommendations from Dr. Radford Pax for her to have a coronary CTA scan done. Patient verbalized understanding. Will send instructions through [place].

## 2021-04-12 ENCOUNTER — Ambulatory Visit
Admission: RE | Admit: 2021-04-12 | Discharge: 2021-04-12 | Disposition: A | Discharge status: Home / Self Care | Payer: Medicare Other | Source: Ambulatory Visit | Attending: Radiation Oncology | Admitting: Radiation Oncology

## 2021-04-12 ENCOUNTER — Other Ambulatory Visit: Payer: Self-pay

## 2021-04-12 DIAGNOSIS — E039 Hypothyroidism, unspecified: Secondary | ICD-10-CM | POA: Diagnosis not present

## 2021-04-12 DIAGNOSIS — T451X5D Adverse effect of antineoplastic and immunosuppressive drugs, subsequent encounter: Secondary | ICD-10-CM | POA: Diagnosis not present

## 2021-04-12 DIAGNOSIS — Z87891 Personal history of nicotine dependence: Secondary | ICD-10-CM | POA: Diagnosis not present

## 2021-04-12 DIAGNOSIS — C411 Malignant neoplasm of mandible: Secondary | ICD-10-CM | POA: Diagnosis not present

## 2021-04-12 DIAGNOSIS — C060 Malignant neoplasm of cheek mucosa: Secondary | ICD-10-CM | POA: Insufficient documentation

## 2021-04-12 DIAGNOSIS — D701 Agranulocytosis secondary to cancer chemotherapy: Secondary | ICD-10-CM | POA: Diagnosis not present

## 2021-04-12 DIAGNOSIS — Z5111 Encounter for antineoplastic chemotherapy: Secondary | ICD-10-CM | POA: Diagnosis not present

## 2021-04-12 DIAGNOSIS — K1231 Oral mucositis (ulcerative) due to antineoplastic therapy: Secondary | ICD-10-CM | POA: Diagnosis not present

## 2021-04-13 ENCOUNTER — Ambulatory Visit
Admission: RE | Admit: 2021-04-13 | Discharge: 2021-04-13 | Disposition: A | Discharge status: Home / Self Care | Payer: Medicare Other | Source: Ambulatory Visit | Attending: Radiation Oncology | Admitting: Radiation Oncology

## 2021-04-13 ENCOUNTER — Ambulatory Visit: Payer: Medicare Other | Attending: Radiation Oncology | Admitting: Physical Therapy

## 2021-04-13 ENCOUNTER — Encounter: Payer: Self-pay | Admitting: Physical Therapy

## 2021-04-13 DIAGNOSIS — Z87891 Personal history of nicotine dependence: Secondary | ICD-10-CM | POA: Diagnosis not present

## 2021-04-13 DIAGNOSIS — K1231 Oral mucositis (ulcerative) due to antineoplastic therapy: Secondary | ICD-10-CM | POA: Diagnosis not present

## 2021-04-13 DIAGNOSIS — M6281 Muscle weakness (generalized): Secondary | ICD-10-CM | POA: Insufficient documentation

## 2021-04-13 DIAGNOSIS — C060 Malignant neoplasm of cheek mucosa: Secondary | ICD-10-CM | POA: Insufficient documentation

## 2021-04-13 DIAGNOSIS — D701 Agranulocytosis secondary to cancer chemotherapy: Secondary | ICD-10-CM | POA: Diagnosis not present

## 2021-04-13 DIAGNOSIS — L599 Disorder of the skin and subcutaneous tissue related to radiation, unspecified: Secondary | ICD-10-CM | POA: Insufficient documentation

## 2021-04-13 DIAGNOSIS — R262 Difficulty in walking, not elsewhere classified: Secondary | ICD-10-CM | POA: Insufficient documentation

## 2021-04-13 DIAGNOSIS — M25611 Stiffness of right shoulder, not elsewhere classified: Secondary | ICD-10-CM | POA: Insufficient documentation

## 2021-04-13 DIAGNOSIS — M25612 Stiffness of left shoulder, not elsewhere classified: Secondary | ICD-10-CM | POA: Insufficient documentation

## 2021-04-13 DIAGNOSIS — Z5111 Encounter for antineoplastic chemotherapy: Secondary | ICD-10-CM | POA: Diagnosis not present

## 2021-04-13 DIAGNOSIS — C411 Malignant neoplasm of mandible: Secondary | ICD-10-CM | POA: Diagnosis not present

## 2021-04-13 DIAGNOSIS — I890 Lymphedema, not elsewhere classified: Secondary | ICD-10-CM | POA: Insufficient documentation

## 2021-04-13 DIAGNOSIS — E039 Hypothyroidism, unspecified: Secondary | ICD-10-CM | POA: Diagnosis not present

## 2021-04-13 DIAGNOSIS — Z483 Aftercare following surgery for neoplasm: Secondary | ICD-10-CM | POA: Insufficient documentation

## 2021-04-13 DIAGNOSIS — T451X5D Adverse effect of antineoplastic and immunosuppressive drugs, subsequent encounter: Secondary | ICD-10-CM | POA: Diagnosis not present

## 2021-04-13 NOTE — Therapy (Signed)
[place] @ [place], [place], [address] Phone:     Fax:     Physical Therapy Treatment  Patient Details  Name: Tammy Brady MRN: [id number] Date of Birth: 08/27/1950 Referring Provider (PT): Reita May Date: 04/13/2021   PT End of Session - 04/13/21 1330     Visit Number 6    Number of Visits 13    Date for PT Re-Evaluation 04/22/21    PT Start Time 3710    PT Stop Time 1325    PT Time Calculation (min) 19 min    Activity Tolerance Patient tolerated treatment well    Behavior During Therapy [facility] for tasks assessed/performed             Past Medical History:  Diagnosis Date   CAD (coronary artery disease)    Mild non-obstructive disease by cath January 2011   Carotid artery disease ([place])    Followed by Dr Kellie Simmering   GERD (gastroesophageal reflux disease)    Hyperlipidemia    Hypertension    Hypothyroidism     Past Surgical History:  Procedure Laterality Date   APPENDECTOMY     IR IMAGING GUIDED PORT INSERTION  03/02/2021    There were no vitals filed for this visit.   Subjective Assessment - 04/13/21 1308     Subjective I have 4 more radiation treatments but I don't think I will get anymore chemo.    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 CT neck same day revealed an enhancing soft tissue lesion along the buccal margin of the left mandible; with lucency in the adjacent bone consistent with known SCC. Also found were necrotic left level IA/B and II/IIIB lymph nodes, noted to be concerning for nodal metastasis. During physical exam performed at this visit, Dr. Amada Jupiter visualized the exophytic mass in and around the left mandibular canine, premolar, and first molar, along with an enlarged level 1A lymph node. The patient opted to proceed with multiple oral biopsies, mandibular  resectioning, and lymphadenectomies, 01/18/21 Resection performed on 01/18/21 revealed the following: 3.3cm tumor, Gr 2 Squamous cell carcinoma of alveolar ridge extending to buccal gingival junction and lingual sulcus, DOI 39mm, neg margins, 6/49 LN+ bilaterally with ECE; no PNI or LVSI; She also underwent Left fibula osteocutaneous free flap and ORIF mandible, will receive 30 fractions of radiation to her oral cavity and bilateral neck and 6 weeks of cisplatin chemotherapy. She started on 8/29 and will complete 04/19/21, PEG placed at [facility] on 01/18/21 and exchanged on 8/5 due to leakage. She uses PEG for most nutrition and medication. She is able to tolerate pureed foods but her reconstructive flap limits her swallowing, PAC placed at [facility] on 03/02/21, reports a 40 pack year smoking history, quitting off and on over the years, stopped smoking in May 2022.    Patient Stated Goals to gain info from providers    Currently in Pain? Yes    Pain Score 5     Pain Location Mouth    Pain Orientation Right;Left    Pain Descriptors / Indicators Constant    Pain Type Acute pain    Pain Onset 1 to 4 weeks ago    Pain Frequency Constant    Aggravating Factors  eating, swallowing    Pain Relieving Factors lidocaine, oxycodone    Effect of Pain on Daily  Activities having difficult swallowing                                             PT Long Term Goals - 03/11/21 0959       PT LONG TERM GOAL #1   Title Pt will be independent in self MLD for long term management of lymphedema.    Time 6    Period Weeks    Status New    Target Date 04/22/21      PT LONG TERM GOAL #2   Title Pt will obtain appropriate compression garments for long term management of lymphedema.    Time 6    Period Weeks    Status New    Target Date 04/22/21      PT LONG TERM GOAL #3   Title Pt will report she is able to descend steps with a step through gait pattern and a decreased fear of falling.     Baseline currently step to    Time 6    Period Weeks    Status New    Target Date 04/22/21      PT LONG TERM GOAL #4   Title Pt will be independent in a home exercise program for continued stretching and strengthening.    Time 6    Period Weeks    Status New    Target Date 04/22/21      PT LONG TERM GOAL #5   Title Pt will report her shoulder ROM has improved and she is no longer having trouble getting dressed or reaching overhead.    Time 6    Period Weeks    Status New    Target Date 04/22/21      Additional Long Term Goals   Additional Long Term Goals Yes      PT LONG TERM GOAL #6   Title Pt will be able to complete 13 sit to stands in 30 seconds without use of UEs to decrease fall risk.    Baseline 7    Time 6    Period Weeks    Status New    Target Date 04/22/21                   Plan - 04/13/21 1330     Clinical Impression Statement No treatment performed today due to fragile radiated and painful skin along front of neck. Pt also feeling too fatigued to do LE exercises today. Placed pt on hold until two weeks after completion of therapy to allow her skin to heal.    PT Frequency 2x / week    PT Duration 6 weeks    PT Treatment/Interventions ADLs/Self Care Home Management;Therapeutic exercise;Balance training;Neuromuscular re-education;Stair training;Gait training;Patient/family education;Orthotic Fit/Training;Manual techniques;Manual lymph drainage;Compression bandaging;Scar mobilization;Passive range of motion;Taping;Vasopneumatic Device;Joint Manipulations    PT Next Visit Plan write 4 wk flexi note and fax to Vicente Males, remeasure neck, measure bilat shoulder ROM and update last goal to include measurements- cont MLD monitoring skin, how are ankle exercises?, PROM to neck, give HEP for LE strengthening, once lymphedema is managed begin bilateral shoulder ROM and LE strengthening and balance    PT Home Exercise Plan head and neck ROM exercises,Access Code  [email or web address]    Consulted and Agree with Plan of Care Patient;Family member/caregiver             Patient  will benefit from skilled therapeutic intervention in order to improve the following deficits and impairments:  Postural dysfunction, Impaired UE functional use, Impaired flexibility, Increased fascial restricitons, Decreased strength, Decreased knowledge of use of DME, Decreased knowledge of precautions, Decreased range of motion, Decreased scar mobility, Increased edema, Difficulty walking, Decreased balance  Visit Diagnosis: Lymphedema, not elsewhere classified  Disorder of the skin and subcutaneous tissue related to radiation, unspecified  Aftercare following surgery for neoplasm  Muscle weakness (generalized)  Difficulty in walking, not elsewhere classified  Stiffness of left shoulder, not elsewhere classified  Stiffness of right shoulder, not elsewhere classified  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[place])     Problem List Patient Active Problem List   Diagnosis Date Noted   Chest pain of uncertain etiology 54/49/2010   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 chemotherapy ([place]) 04/07/2021   Mucositis due to antineoplastic therapy 03/31/2021   Drug induced constipation 03/31/2021   Chemotherapy-induced nausea 03/31/2021   Port-A-Cath in place 03/09/2021   Carcinoma of lower gum ([place]) 02/20/2021   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[place]) 12/08/2020   Gastritis 12/08/2020   Anxiety 12/08/2020   Need for prophylactic vaccination and inoculation against influenza 07/30/2020   Depression 11/26/2015   GERD (gastroesophageal reflux disease) 11/26/2015   Migraine headache 11/26/2015   Screening for diabetes mellitus 09/03/2015   Hypothyroidism 07/22/2009   Mixed hyperlipidemia 07/22/2009   Tobacco user 07/22/2009   Hypertension, essential 07/22/2009   CAD, NATIVE VESSEL 07/22/2009   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   Coronary artery spasm ([place]) 07/22/2009     Allyson Sabal [place], PT 04/13/2021, 1:32 PM  [place] @ [place], [place], [address] Phone:     Fax:     Name: Tammy Brady MRN: [id number] Date of Birth: 28-Aug-1950   Manus Gunning, PT 04/13/21 1:33 PM

## 2021-04-14 ENCOUNTER — Encounter: Payer: Self-pay | Admitting: Physician Assistant

## 2021-04-14 ENCOUNTER — Other Ambulatory Visit: Payer: Self-pay

## 2021-04-14 ENCOUNTER — Ambulatory Visit
Admission: RE | Admit: 2021-04-14 | Discharge: 2021-04-14 | Disposition: A | Discharge status: Home / Self Care | Payer: Medicare Other | Source: Ambulatory Visit | Attending: Radiation Oncology | Admitting: Radiation Oncology

## 2021-04-14 ENCOUNTER — Other Ambulatory Visit: Payer: Self-pay | Admitting: Physician Assistant

## 2021-04-14 ENCOUNTER — Inpatient Hospital Stay: Payer: Medicare Other | Admitting: Hematology and Oncology

## 2021-04-14 ENCOUNTER — Inpatient Hospital Stay: Payer: Medicare Other | Attending: Hematology and Oncology

## 2021-04-14 ENCOUNTER — Encounter: Payer: Self-pay | Admitting: Hematology and Oncology

## 2021-04-14 ENCOUNTER — Inpatient Hospital Stay: Payer: Medicare Other

## 2021-04-14 ENCOUNTER — Inpatient Hospital Stay: Payer: Medicare Other | Admitting: Dietician

## 2021-04-14 DIAGNOSIS — Z5111 Encounter for antineoplastic chemotherapy: Secondary | ICD-10-CM | POA: Insufficient documentation

## 2021-04-14 DIAGNOSIS — C060 Malignant neoplasm of cheek mucosa: Secondary | ICD-10-CM

## 2021-04-14 DIAGNOSIS — D701 Agranulocytosis secondary to cancer chemotherapy: Secondary | ICD-10-CM

## 2021-04-14 DIAGNOSIS — C411 Malignant neoplasm of mandible: Secondary | ICD-10-CM | POA: Diagnosis not present

## 2021-04-14 DIAGNOSIS — Z87891 Personal history of nicotine dependence: Secondary | ICD-10-CM | POA: Diagnosis not present

## 2021-04-14 DIAGNOSIS — R079 Chest pain, unspecified: Secondary | ICD-10-CM

## 2021-04-14 DIAGNOSIS — T451X5A Adverse effect of antineoplastic and immunosuppressive drugs, initial encounter: Secondary | ICD-10-CM | POA: Diagnosis not present

## 2021-04-14 DIAGNOSIS — K1231 Oral mucositis (ulcerative) due to antineoplastic therapy: Secondary | ICD-10-CM | POA: Insufficient documentation

## 2021-04-14 DIAGNOSIS — T451X5D Adverse effect of antineoplastic and immunosuppressive drugs, subsequent encounter: Secondary | ICD-10-CM | POA: Insufficient documentation

## 2021-04-14 DIAGNOSIS — E039 Hypothyroidism, unspecified: Secondary | ICD-10-CM | POA: Insufficient documentation

## 2021-04-14 LAB — CBC WITH DIFFERENTIAL (CANCER CENTER ONLY)
Abs Immature Granulocytes: 0.01 10*3/uL (ref 0.00–0.07)
Basophils Absolute: 0 10*3/uL (ref 0.0–0.1)
Basophils Relative: 0 %
Eosinophils Absolute: 0 10*3/uL (ref 0.0–0.5)
Eosinophils Relative: 0 %
HCT: 31.5 % — ABNORMAL LOW (ref 36.0–46.0)
Hemoglobin: 10.5 g/dL — ABNORMAL LOW (ref 12.0–15.0)
Immature Granulocytes: 1 %
Lymphocytes Relative: 17 %
Lymphs Abs: 0.2 10*3/uL — ABNORMAL LOW (ref 0.7–4.0)
MCH: 28.3 pg (ref 26.0–34.0)
MCHC: 33.3 g/dL (ref 30.0–36.0)
MCV: 84.9 fL (ref 80.0–100.0)
Monocytes Absolute: 0.2 10*3/uL (ref 0.1–1.0)
Monocytes Relative: 20 %
Neutro Abs: 0.6 10*3/uL — ABNORMAL LOW (ref 1.7–7.7)
Neutrophils Relative %: 62 %
Platelet Count: 110 10*3/uL — ABNORMAL LOW (ref 150–400)
RBC: 3.71 MIL/uL — ABNORMAL LOW (ref 3.87–5.11)
RDW: 15.4 % (ref 11.5–15.5)
WBC Count: 1 10*3/uL — ABNORMAL LOW (ref 4.0–10.5)
nRBC: 0 % (ref 0.0–0.2)

## 2021-04-14 LAB — BASIC METABOLIC PANEL - CANCER CENTER ONLY
Anion gap: 10 (ref 5–15)
BUN: 21 mg/dL (ref 8–23)
CO2: 28 mmol/L (ref 22–32)
Calcium: 9.4 mg/dL (ref 8.9–10.3)
Chloride: 95 mmol/L — ABNORMAL LOW (ref 98–111)
Creatinine: 0.72 mg/dL (ref 0.44–1.00)
GFR, Estimated: >60 mL/min (ref >60)
Glucose, Bld: 126 mg/dL — ABNORMAL HIGH (ref 70–99)
Potassium: 4.4 mmol/L (ref 3.5–5.1)
Sodium: 133 mmol/L — ABNORMAL LOW (ref 135–145)

## 2021-04-14 LAB — MAGNESIUM: Magnesium: 2.8 mg/dL — ABNORMAL HIGH (ref 1.7–2.4)

## 2021-04-14 MED ORDER — DILTIAZEM HCL ER COATED BEADS 360 MG PO TB24: 360.0000 mg | ORAL_TABLET | Freq: Every day | ORAL | 3 refills | Status: DC

## 2021-04-14 NOTE — Progress Notes (Signed)
Nutrition Follow-up:  Patient receiving concurrent adjuvant chemoradiation with weekly cisplatin for SCC of oral cavity. S/p G-tube replacement at [facility] 8/5.   Patient did not receive cycle 6 of cisplatin today secondary to severe leukopenia. Nutrition follow-up completed via telephone with patient and husband Delfino Lovett). Patient having difficult time speaking today secondary to severe mucositis. Husband reports ulcers have spread to her lip and significant pain with swallowing. Patient is not drinking water or fairlife shakes orally. Husband reports patient unable to tolerate medication in pill form, MD has called in liquid narcotic pain medication. Patient is tolerating 6 cartons Osmolite 1.5 via tube. Patient was seen at [facility] 9/30 to evaluate leaking around PEG. Husband reports no indication of infections, balloon properly inflated and bumper was adjusted to fit more snug against skin. Husband reports tube continues to leak, but this has improved some. Husband reports he is frustrated with current DME [facility]) set up by [facility] and requesting to be switched to a different company. Husband reports long weight times to connect with department to request shipment. He reports company has not been sending supplies and when this was last requested, he was told it was not a part of his order.   Medications: reviewed  Labs: Na 133, Glucose 126, Mg 2.8  Anthropometrics: Weight 157 lb 1.6 oz today decreased 3 lbs (1.9%) in 7 days. This is significant.   9/21 - 157 lb 9/14 - 158 lb  9/7 - 161 lb 7 oz 8/31 - 164 lb 3.2 oz   Estimated Energy Needs:   Kcals: 2140-2350 Protein: 107-121 Fluid: 2.3 L  NUTRITION DIAGNOSIS: Inadequate oral intake ongoing, patient relying on tube feedings   INTERVENTION:  Continue 6 cartons Osmolite 1.5 via tube split over 4 feedings/day. Flush with 90 ml water before and after each feeding. Drink by mouth or give via tube additional 2 cups fluids daily. This provides  2130 kcal, 89 grams protein, 2286 ml total water  Contacted [employer] for future formula and tube feeding supply needs Bag with split guaze and tape left at registration desk for pick up on 10/6 - husband aware and appreciative Continue lidocaine mouth rinses  Continue Fairlife Shake for added calories and protein as tolerated  Provided support and encouragement Patient has contact information    MONITORING, EVALUATION, GOAL: weight trends, intake, tube feedings   NEXT VISIT: Wednesday October 12 during infusion

## 2021-04-14 NOTE — Assessment & Plan Note (Signed)
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  Her pathologic staging is pT3 N3B. Given positive margins and presence of extracapsular extension, we agreed with adjuvant chemoradiation. She completed 5 weekly cycles of cisplatin concurrent with radiation. She is here for follow-up.  Review of systems pertinent for severe fatigue, mucositis, tinnitus. Physical examination, she appears very weak today, severe erythema on her neck, drooling saliva, cannot open her mouth quite wide.  Have reviewed the labs today, severe neutropenia, will defer cycle 6 of planned chemotherapy.  She will return to clinic in 1 week for anticipated cycle 7.

## 2021-04-14 NOTE — Assessment & Plan Note (Signed)
She is scheduled for CT angiogram with her cardiologist.

## 2021-04-14 NOTE — Assessment & Plan Note (Signed)
Severe leukopenia secondary to antineoplastic chemotherapy.  We will have to defer cycle 6 of cisplatin that has been planned for today.

## 2021-04-14 NOTE — Assessment & Plan Note (Signed)
Severe mucositis.  She was able to swallow a pill and this was prescribed but she now prefers liquid narcotic pain medication.  This will be sent to her pharmacy of choice.

## 2021-04-14 NOTE — Progress Notes (Signed)
[facility] CONSULT NOTE  Patient Care Team: Marge Duncans, PA-C as PCP - General (Physician Assistant) Malmfelt, Stephani Police, RN as Oncology Nurse Navigator Eppie Gibson, MD as Consulting Physician (Radiation Oncology) Benay Pike, MD as Consulting Physician (Hematology and Oncology)  CHIEF COMPLAINTS/PURPOSE OF CONSULTATION:  SCC Oral mucosa  ASSESSMENT & PLAN:   Squamous cell carcinoma of oral mucosa Tammy Brady) This is a very pleasant 70 year old female patient with new diagnosis of squamous cell carcinoma of the oral cavity status post surgery on January 18, 2021 with pathology showing a 3.3 cm tumor, grade 2 squamous cell carcinoma of the alveolar ridge, negative margins, 6 out of 49 lymph nodes positive bilaterally with extracapsular extension referred to medical oncology for consideration of adjuvant chemotherapy.  Her pathologic staging is pT3 N3B. Given positive margins and presence of extracapsular extension, we agreed with adjuvant chemoradiation. She completed 5 weekly cycles of cisplatin concurrent with radiation. She is here for follow-up.  Review of systems pertinent for severe fatigue, mucositis, tinnitus. Physical examination, she appears very weak today, severe erythema on her neck, drooling saliva, cannot open her mouth quite wide.  Have reviewed the labs today, severe neutropenia, will defer cycle 6 of planned chemotherapy.  She will return to clinic in 1 week for anticipated cycle 7.  Mucositis due to antineoplastic therapy Severe mucositis.  She was able to swallow a pill and this was prescribed but she now prefers liquid narcotic pain medication.  This will be sent to her pharmacy of choice.  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 chemotherapy (HCC) Severe leukopenia secondary to antineoplastic chemotherapy.  We will have to defer cycle 6 of cisplatin that has been planned for today.  Chest pain of uncertain etiology She is scheduled for CT angiogram with her  cardiologist.   HISTORY OF PRESENTING ILLNESS:   Tammy Brady 70 y.o. female is here because of  SCC oral mucosa  This is a pleasant 70 year old female patient who first presented to her dentist with a painful lesion to her gums noticed more like at the end of 2021.  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  Subsequently she was referred to Dr. Amada Jupiter on December 14, 2020 who discussed surgical intervention with patient.  She had neck CT which showed an enhancing soft tissue lesion along the buccal margin of left mandible with a lucency in the adjacent bone consistent with known SCC.  She was also found to have necrotic left level 1A/PN2/3B lymph nodes noted to be concerning for nodal metastasis.  She underwent resection on January 18, 2021 which revealed 3.3 cm tumor, grade 2 squamous cell carcinoma of the alveolar ridge extending to buccal gingival junction and lingual sulcus, depth of invasion 11 mm, negative margins, 6 out of 49 lymph nodes positive bilaterally with extracapsular extension, no perineural invasion or lymphovascular invasion.  She is now referred to medical oncology for consideration of adjuvant chemoradiation.  PATHOLOGY REPORT: Diagnosis     A: Mouth, RMT mucosal margin, excision - Benign squamous mucosa and submucosa with no tumor seen.   B: Mouth, left buccal margin, excision - Benign squamous mucosa and submucosa with no tumor seen.   C: Mouth, labial mucosal margin, excision - Benign squamous mucosa and submucosa with no tumor seen.   D: Mouth, anterior gingival mucosal margin, excision - Benign squamous mucosa and submucosa with no tumor seen.   E: Mouth, floor of mouth, biopsy - Benign squamous  mucosa and submucosa with no tumor seen.   F: Tongue, ventral tongue, biopsy - Benign squamous mucosa and submucosa with no tumor seen.   G: Bone marrow, anterior mandible, biopsy - Fragment of blood clot, no tumor  seen, on original frozen section only, tissue has been cut through on permanent sections.   H: Bone marrow, posterior mandible, biopsy - Fragment of blood clot, no tumor seen, on original frozen section only, tissue has been cut through on permanent sections.   I: Lymph node, left neck level 1B and level 1A, lymphadenectomy - Metastatic squamous cell carcinoma with cystic change, involving 2 of 4 lymph nodes (2/4), size of largest metastasis 2.2 cm. - Benign submandibular gland is also present.   J: Lymph node, right level 1B, lymphadenectomy - Metastatic squamous cell carcinoma involving 2 of 4 lymph nodes (2/4), with cystic change, size of largest metastasis 2.0 cm in greatest dimension, with extranodal extension of tumor identified. - Submandibular gland present with no tumor seen.   K: Lymph node, left EJ, excision - No tumor seen in three lymph nodes (0/3).   L: Lymph node, left neck, level 2, lymphadenectomy - No tumor seen in 14 lymph nodes (0/14).   M: Lymph node, left neck, level 3, lymphadenectomy - Metastatic squamous cell carcinoma involving 1 of 7 lymph nodes (1/7), size of metastasis 2.2 cm, with extranodal extension of tumor identified.   N: Lymph node, left neck, level 4, lymphadenectomy - Metastatic squamous cell carcinoma involving 1 of 2 lymph nodes (1/2), size of metastasis 1 mm.   O: Lymph node, right neck, level 2, lymphadenectomy - No tumor seen in eight lymph nodes (0/8).   P: Lymph node, right neck, level 3, lymphadenectomy - No tumor seen in four lymph nodes (0/4).   Q: Lymph node, right neck, level 4, lymphadenectomy - No tumor seen in three lymph nodes (0/3).   R: Left composite mandibular resection (segmental mandibulectomy and resection of submental gland) - Squamous cell carcinoma, moderately differentiated, of alveolar ridge extending to buccal gingival junction and lingual sulcus. - Tumor size 3.3 cm in greatest lateral dimension, depth of invasion  1.1 cm. - Tumor involves the alveolar bone and does not exceed the depth of the roots of the teeth, and does not extend into the body of the mandible.  - Surgical margins free of tumor (soft tissue and bone margins all negative for tumor) - Submental gland present with no tumor seen.   S: Teeth, extraction - Multiple intact and fragmented teeth (gross examination only).     Given positive margin and extracapsular extension, we have discussed about adjuvant chemoradiation with weekly cisplatin.  Started weekly cisplatin on 03/10/2021. Cycle 2-day 1 on 03/17/2021 Cycle 3-day 1 on 03/24/2021. Cycle 4 day 1 on 03/31/2021 Cycle 5 day 1 on 04/07/2021  Interval history   Patient is here for follow-up prior to cycle 6 of cisplatin.     Rest of the pertinent 10 point ROS reviewed and negative.   MEDICAL HISTORY:  Past Medical History:  Diagnosis Date   CAD (coronary artery disease)    Mild non-obstructive disease by cath January 2011   Carotid artery disease ([place])    Followed by Dr Kellie Simmering   GERD (gastroesophageal reflux disease)    Hyperlipidemia    Hypertension    Hypothyroidism     SURGICAL HISTORY: Past Surgical History:  Procedure Laterality Date   APPENDECTOMY     IR IMAGING GUIDED PORT INSERTION  03/02/2021  SOCIAL HISTORY: Social History   Socioeconomic History   Marital status: Married    Spouse name: Not on file   Number of children: Not on file   Years of education: Not on file   Highest education level: Not on file  Occupational History   Occupation: Dealer    Employer: [employer]  Tobacco Use   Smoking status: Former    Packs/day: 1.00    Years: 40.00    Pack years: 40.00    Types: Cigarettes    Quit date: 05/11/2009    Years since quitting: 11.9   Smokeless tobacco: Never  Substance and Sexual Activity   Alcohol use: Not Currently   Drug use: No   Sexual activity: Not Currently  Other Topics Concern   Not on file  Social History Narrative    Not on file   Social Determinants of Health   Financial Resource Strain: Low Risk    Difficulty of Paying Living Expenses: Not hard at all  Food Insecurity: No Food Insecurity   Worried About Charity fundraiser in the Last Year: Never true   [place] in the Last Year: Never true  Transportation Needs: No Transportation Needs   Lack of Transportation (Medical): No   Lack of Transportation (Non-Medical): No  Physical Activity: Not on file  Stress: No Stress Concern Present   Feeling of Stress : Only a little  Social Connections: Engineer, building services of Communication with Friends and Family: More than three times a week   Frequency of Social Gatherings with Friends and Family: More than three times a week   Attends Religious Services: More than 4 times per year   Active Member of [employer] or Organizations: Yes   Attends Music therapist: More than 4 times per year   Marital Status: Married  Human resources officer Violence: Not on file    FAMILY HISTORY: Family History  Problem Relation Age of Onset   Cancer Mother    Coronary artery disease Father    Coronary artery disease Maternal Grandmother     ALLERGIES:  is allergic to codeine.  MEDICATIONS:  Current Outpatient Medications  Medication Sig Dispense Refill   acetaminophen (TYLENOL) 500 MG tablet Take 500 mg by mouth every 6 (six) hours as needed.     aspirin 325 MG tablet Take 650 mg by mouth as needed for mild pain or moderate pain.     calcium carbonate (OS-CAL) 600 MG TABS Take 600 mg by mouth daily.     dexamethasone (DECADRON) 4 MG tablet Take 2 tablets (8 mg total) by mouth daily. Take daily x 3 days starting the day after cisplatin chemotherapy. Take with food. 30 tablet 1   diltiazem (CARDIZEM CD) 360 MG 24 hr capsule TAKE 1 CAPSULE BY MOUTH  DAILY 90 capsule 0   gabapentin (NEURONTIN) 300 MG capsule Take 1 capsule by mouth 3 (three) times daily.     ibuprofen (ADVIL) 600 MG tablet Take 1  tablet by mouth every 6 (six) hours as needed.     levothyroxine (SYNTHROID) 112 MCG tablet TAKE 1 TABLET BY MOUTH  DAILY 90 tablet 0   lidocaine (XYLOCAINE) 2 % solution Patient: Mix 1part 2% viscous lidocaine, 1part H20. Swish & swallow 54mL of diluted mixture, 47min before meals and at bedtime, up to QID 200 mL 4   lidocaine-prilocaine (EMLA) cream Apply to affected area once 30 g 3   Multiple Vitamin (MULTIVITAMIN) tablet Take 1 tablet  by mouth daily.     nitroGLYCERIN (NITROSTAT) 0.4 MG SL tablet Place 1 tablet (0.4 mg total) under the tongue every 5 (five) minutes as needed. 25 tablet 6   Nutritional Supplements (FEEDING SUPPLEMENT, OSMOLITE 1.5 CAL,) LIQD Place into feeding tube daily. 4-5 cartons daily     omeprazole (PRILOSEC) 20 MG capsule Take 1 capsule (20 mg total) by mouth daily. 30 capsule 1   ondansetron (ZOFRAN) 8 MG tablet Take 1 tablet (8 mg total) by mouth 2 (two) times daily as needed. Start on the third day after cisplatin chemotherapy. 30 tablet 1   oxyCODONE (OXY IR/ROXICODONE) 5 MG immediate release tablet Take 1 tablet (5 mg total) by mouth every 6 (six) hours as needed for severe pain. 30 tablet 0   Polyethylene Glycol 3350 (MIRALAX PO) Take by mouth as needed.     prochlorperazine (COMPAZINE) 10 MG tablet Take 1 tablet (10 mg total) by mouth every 6 (six) hours as needed (Nausea or vomiting). 30 tablet 1   simvastatin (ZOCOR) 40 MG tablet TAKE 1 TABLET BY MOUTH AT  BEDTIME 90 tablet 0   traZODone (DESYREL) 50 MG tablet Take 1 tablet (50 mg total) by mouth at bedtime. 90 tablet 1   metoprolol tartrate (LOPRESSOR) 50 MG tablet Take 1 tablet (50 mg total) by mouth once for 1 dose. Take 1 tablet two hours prior to CT scan. 1 tablet 0   No current facility-administered medications for this visit.    PHYSICAL EXAMINATION:  ECOG PERFORMANCE STATUS: 0 - Asymptomatic  Vitals:   04/14/21 0842  BP: (!) 133/54  Pulse: 85  Resp: 17  Temp: 97.9 F (36.6 C)  SpO2: 96%    Focused exam  Genera appearance: She appears very tired without any acute respiratory distress. Head and neck exam: Skin over the head and neck appears very erythematous. She was drooling saliva.  Patient cannot swallow anything. Lower extremities: No lower extremity edema.  LABORATORY DATA:  I have reviewed the data as listed Lab Results  Component Value Date   WBC 1.0 (L) 04/14/2021   HGB 10.5 (L) 04/14/2021   HCT 31.5 (L) 04/14/2021   MCV 84.9 04/14/2021   PLT 110 (L) 04/14/2021     Chemistry      Component Value Date/Time   NA 133 (L) 04/14/2021 0827   NA 142 12/08/2020 1356   K 4.4 04/14/2021 0827   CL 95 (L) 04/14/2021 0827   CO2 28 04/14/2021 0827   BUN 21 04/14/2021 0827   BUN 15 12/08/2020 1356   CREATININE 0.72 04/14/2021 0827      Component Value Date/Time   CALCIUM 9.4 04/14/2021 0827   ALKPHOS 84 12/08/2020 1356   AST 39 12/08/2020 1356   ALT 24 12/08/2020 1356   BILITOT <0.2 12/08/2020 1356    Have reviewed pertinent labs, Leukopenia, ANC of 1000 Cycle 6 of cisplatin chemotherapy will have to be deferred given severe neutropenia.  RADIOGRAPHIC STUDIES: I have personally reviewed the radiological images as listed and agreed with the findings in the report. No results found.  All questions were answered. The patient knows to call the clinic with any problems, questions or concerns. I spent 30 minutes in the care of this patient including H and P, review of records, counseling and coordination of care.    Benay Pike, MD 04/14/2021 9:22 AM

## 2021-04-15 ENCOUNTER — Ambulatory Visit
Admission: RE | Admit: 2021-04-15 | Discharge: 2021-04-15 | Disposition: A | Discharge status: Home / Self Care | Payer: Medicare Other | Source: Ambulatory Visit | Attending: Radiation Oncology | Admitting: Radiation Oncology

## 2021-04-15 ENCOUNTER — Encounter: Payer: Self-pay | Admitting: Radiation Oncology

## 2021-04-15 ENCOUNTER — Encounter: Payer: Medicare Other | Admitting: Rehabilitation

## 2021-04-15 DIAGNOSIS — D701 Agranulocytosis secondary to cancer chemotherapy: Secondary | ICD-10-CM | POA: Diagnosis not present

## 2021-04-15 DIAGNOSIS — E039 Hypothyroidism, unspecified: Secondary | ICD-10-CM | POA: Diagnosis not present

## 2021-04-15 DIAGNOSIS — R1310 Dysphagia, unspecified: Secondary | ICD-10-CM | POA: Diagnosis not present

## 2021-04-15 DIAGNOSIS — T451X5D Adverse effect of antineoplastic and immunosuppressive drugs, subsequent encounter: Secondary | ICD-10-CM | POA: Diagnosis not present

## 2021-04-15 DIAGNOSIS — Z5111 Encounter for antineoplastic chemotherapy: Secondary | ICD-10-CM | POA: Diagnosis not present

## 2021-04-15 DIAGNOSIS — J620 Pneumoconiosis due to talc dust: Secondary | ICD-10-CM | POA: Diagnosis not present

## 2021-04-15 DIAGNOSIS — C060 Malignant neoplasm of cheek mucosa: Secondary | ICD-10-CM | POA: Diagnosis not present

## 2021-04-15 DIAGNOSIS — R2689 Other abnormalities of gait and mobility: Secondary | ICD-10-CM | POA: Diagnosis not present

## 2021-04-15 DIAGNOSIS — K1231 Oral mucositis (ulcerative) due to antineoplastic therapy: Secondary | ICD-10-CM | POA: Diagnosis not present

## 2021-04-15 DIAGNOSIS — Z87891 Personal history of nicotine dependence: Secondary | ICD-10-CM | POA: Diagnosis not present

## 2021-04-15 DIAGNOSIS — C411 Malignant neoplasm of mandible: Secondary | ICD-10-CM | POA: Diagnosis not present

## 2021-04-16 ENCOUNTER — Ambulatory Visit
Admission: RE | Admit: 2021-04-16 | Discharge: 2021-04-16 | Disposition: A | Discharge status: Home / Self Care | Payer: Medicare Other | Source: Ambulatory Visit | Attending: Radiation Oncology | Admitting: Radiation Oncology

## 2021-04-16 ENCOUNTER — Other Ambulatory Visit: Payer: Self-pay | Admitting: Hematology

## 2021-04-16 ENCOUNTER — Other Ambulatory Visit: Payer: Self-pay

## 2021-04-16 DIAGNOSIS — T451X5D Adverse effect of antineoplastic and immunosuppressive drugs, subsequent encounter: Secondary | ICD-10-CM | POA: Diagnosis not present

## 2021-04-16 DIAGNOSIS — K1231 Oral mucositis (ulcerative) due to antineoplastic therapy: Secondary | ICD-10-CM | POA: Diagnosis not present

## 2021-04-16 DIAGNOSIS — D701 Agranulocytosis secondary to cancer chemotherapy: Secondary | ICD-10-CM | POA: Diagnosis not present

## 2021-04-16 DIAGNOSIS — E039 Hypothyroidism, unspecified: Secondary | ICD-10-CM | POA: Diagnosis not present

## 2021-04-16 DIAGNOSIS — C060 Malignant neoplasm of cheek mucosa: Secondary | ICD-10-CM | POA: Diagnosis not present

## 2021-04-16 DIAGNOSIS — C411 Malignant neoplasm of mandible: Secondary | ICD-10-CM | POA: Diagnosis not present

## 2021-04-16 DIAGNOSIS — Z87891 Personal history of nicotine dependence: Secondary | ICD-10-CM | POA: Diagnosis not present

## 2021-04-16 DIAGNOSIS — Z5111 Encounter for antineoplastic chemotherapy: Secondary | ICD-10-CM | POA: Diagnosis not present

## 2021-04-16 MED ORDER — MORPHINE SULFATE (CONCENTRATE) 10 MG /0.5 ML PO SOLN: 5.0000 mg | Freq: Four times a day (QID) | ORAL | 0 refills | Status: DC | PRN

## 2021-04-16 MED ORDER — SONAFINE EX EMUL
1.0000 "application " | Freq: Two times a day (BID) | CUTANEOUS | Status: DC
  Administered 2021-04-16: 1 via TOPICAL

## 2021-04-19 ENCOUNTER — Ambulatory Visit
Admission: RE | Admit: 2021-04-19 | Discharge: 2021-04-19 | Disposition: A | Discharge status: Home / Self Care | Payer: Medicare Other | Source: Ambulatory Visit | Attending: Radiation Oncology | Admitting: Radiation Oncology

## 2021-04-19 ENCOUNTER — Ambulatory Visit: Payer: Medicare Other | Admitting: Physician Assistant

## 2021-04-19 ENCOUNTER — Encounter: Payer: Self-pay | Admitting: Radiation Oncology

## 2021-04-19 ENCOUNTER — Other Ambulatory Visit: Payer: Self-pay

## 2021-04-19 ENCOUNTER — Encounter: Payer: Medicare Other | Admitting: Physical Therapy

## 2021-04-19 ENCOUNTER — Other Ambulatory Visit: Payer: Self-pay | Admitting: Hematology and Oncology

## 2021-04-19 DIAGNOSIS — E039 Hypothyroidism, unspecified: Secondary | ICD-10-CM | POA: Diagnosis not present

## 2021-04-19 DIAGNOSIS — D701 Agranulocytosis secondary to cancer chemotherapy: Secondary | ICD-10-CM | POA: Diagnosis not present

## 2021-04-19 DIAGNOSIS — Z87891 Personal history of nicotine dependence: Secondary | ICD-10-CM | POA: Diagnosis not present

## 2021-04-19 DIAGNOSIS — T451X5D Adverse effect of antineoplastic and immunosuppressive drugs, subsequent encounter: Secondary | ICD-10-CM | POA: Diagnosis not present

## 2021-04-19 DIAGNOSIS — Z5111 Encounter for antineoplastic chemotherapy: Secondary | ICD-10-CM | POA: Diagnosis not present

## 2021-04-19 DIAGNOSIS — C411 Malignant neoplasm of mandible: Secondary | ICD-10-CM | POA: Diagnosis not present

## 2021-04-19 DIAGNOSIS — C060 Malignant neoplasm of cheek mucosa: Secondary | ICD-10-CM | POA: Diagnosis not present

## 2021-04-19 DIAGNOSIS — K1231 Oral mucositis (ulcerative) due to antineoplastic therapy: Secondary | ICD-10-CM | POA: Diagnosis not present

## 2021-04-19 NOTE — Progress Notes (Signed)
Oncology Nurse Navigator Documentation   Met with Ms. Westman and her husband after her final RT to offer support and to celebrate end of radiation treatment.   Provided verbal/written post-RT guidance: Importance of keeping all follow-up appts, especially those with Nutrition and SLP. Importance of protecting treatment area from sun. Continuation of Sonafine application 2-3 times daily, application of antibiotic ointment to areas of raw skin; when supply of Sonafine exhausted transition to OTC lotion with vitamin E. Ms. Mckendree is aware of all upcoming appointments using MyChart.  Explained my role as navigator will continue for several more months, encouraged her to call me with needs/concerns.    Harlow Asa RN, BSN, OCN Head & Neck Oncology Nurse [place] at [facility] Phone # [phone]  Fax # [phone]

## 2021-04-20 ENCOUNTER — Encounter: Payer: Self-pay | Admitting: Physician Assistant

## 2021-04-20 ENCOUNTER — Ambulatory Visit (INDEPENDENT_AMBULATORY_CARE_PROVIDER_SITE_OTHER): Payer: Medicare Other | Admitting: Physician Assistant

## 2021-04-20 VITALS — BP 132/86 | HR 90 | Temp 97.3°F | Ht 66.0 in | Wt 158.0 lb

## 2021-04-20 DIAGNOSIS — F419 Anxiety disorder, unspecified: Secondary | ICD-10-CM

## 2021-04-20 DIAGNOSIS — I10 Essential (primary) hypertension: Secondary | ICD-10-CM

## 2021-04-20 DIAGNOSIS — E782 Mixed hyperlipidemia: Secondary | ICD-10-CM

## 2021-04-20 DIAGNOSIS — E039 Hypothyroidism, unspecified: Secondary | ICD-10-CM | POA: Diagnosis not present

## 2021-04-20 DIAGNOSIS — C060 Malignant neoplasm of cheek mucosa: Secondary | ICD-10-CM | POA: Diagnosis not present

## 2021-04-20 MED ORDER — TRAZODONE HCL 50 MG PO TABS: 50.0000 mg | ORAL_TABLET | Freq: Every day | ORAL | 1 refills | Status: DC

## 2021-04-20 NOTE — Progress Notes (Signed)
Subjective:  Patient ID: Tammy Brady, female    DOB: 1951-06-20  Age: 70 y.o. MRN: [id number]  Chief Complaint  Patient presents with   Hypertension    Hypertension  Pt presents for follow up of hypertension.The patient is tolerating the medication well without side effects. Compliance with treatment has been good; including taking medication as directed , maintains a healthy diet and regular exercise regimen , and following up as directed.pt taking cardizem CD 360mg  qd - bp slightly elevated today but has not taken medication  Mixed hyperlipidemia  Pt presents with hyperlipidemia.  Compliance with treatment has been good; The patient is compliant with medications, maintains a low cholesterol diet , follows up as directed , and maintains an exercise regimen . The patient denies experiencing any hypercholesterolemia related symptoms. Currently taking zocor 40mg  qd - due for labwork  Pt with history of hypothyroidism - on synthroid 169mcg qd - due for labwork  Pt with newly diagnosed squamous cell carcinoma of oral mucosa - she is being followed by Dr Amada Jupiter with [facility] --- and Dr Ihor Austin -- She also follows with Dr Lanell Persons (radiation oncologist) and Dr Chryl Heck - medical oncologist Pt had extensive oral surgery on 01/18/21 - she had her lower mandible, most of her teeth, soft tissue and lymph nodes removed - has had 30 radiation treatments and 5 chemo treatments - Pt does have a Gtube and currently taking in 6 cartons of osmolite daily along with [employer] protein drinks Pt did have a fibula flap done for reconstruction of oral cavity Pt state she is still having some fatigue  Current Outpatient Medications on File Prior to Visit  Medication Sig Dispense Refill   acetaminophen (TYLENOL) 500 MG tablet Take 500 mg by mouth every 6 (six) hours as needed.     aspirin 325 MG tablet Take 650 mg by mouth as needed for mild pain or moderate pain.     calcium carbonate  (OS-CAL) 600 MG TABS Take 600 mg by mouth daily.     dexamethasone (DECADRON) 4 MG tablet Take 2 tablets (8 mg total) by mouth daily. Take daily x 3 days starting the day after cisplatin chemotherapy. Take with food. 30 tablet 1   diltiazem (CARDIZEM LA) 360 MG 24 hr tablet Take 1 tablet (360 mg total) by mouth daily. 30 tablet 3   gabapentin (NEURONTIN) 300 MG capsule Take 1 capsule by mouth 3 (three) times daily.     ibuprofen (ADVIL) 600 MG tablet Take 1 tablet by mouth every 6 (six) hours as needed.     levothyroxine (SYNTHROID) 112 MCG tablet TAKE 1 TABLET BY MOUTH  DAILY 90 tablet 0   lidocaine (XYLOCAINE) 2 % solution Patient: Mix 1part 2% viscous lidocaine, 1part H20. Swish & swallow 16mL of diluted mixture, 59min before meals and at bedtime, up to QID 200 mL 4   lidocaine-prilocaine (EMLA) cream Apply to affected area once 30 g 3   Morphine Sulfate (MORPHINE CONCENTRATE) 10 mg / 0.5 ml concentrated solution Take 0.25-0.5 mLs (5-10 mg total) by mouth every 6 (six) hours as needed for severe pain. 30 mL 0   Multiple Vitamin (MULTIVITAMIN) tablet Take 1 tablet by mouth daily.     nitroGLYCERIN (NITROSTAT) 0.4 MG SL tablet Place 1 tablet (0.4 mg total) under the tongue every 5 (five) minutes as needed. 25 tablet 6   Nutritional Supplements (FEEDING SUPPLEMENT, OSMOLITE 1.5 CAL,) LIQD Place into feeding tube daily. 4-5 cartons daily  omeprazole (PRILOSEC) 20 MG capsule Take 1 capsule (20 mg total) by mouth daily. 30 capsule 1   ondansetron (ZOFRAN) 8 MG tablet Take 1 tablet (8 mg total) by mouth 2 (two) times daily as needed. Start on the third day after cisplatin chemotherapy. 30 tablet 1   Polyethylene Glycol 3350 (MIRALAX PO) Take by mouth as needed.     prochlorperazine (COMPAZINE) 10 MG tablet Take 1 tablet (10 mg total) by mouth every 6 (six) hours as needed (Nausea or vomiting). 30 tablet 1   simvastatin (ZOCOR) 40 MG tablet TAKE 1 TABLET BY MOUTH AT  BEDTIME 90 tablet 0   metoprolol  tartrate (LOPRESSOR) 50 MG tablet Take 1 tablet (50 mg total) by mouth once for 1 dose. Take 1 tablet two hours prior to CT scan. 1 tablet 0   No current facility-administered medications on file prior to visit.   Past Medical History:  Diagnosis Date   CAD (coronary artery disease)    Mild non-obstructive disease by cath January 2011   Carotid artery disease ([place])    Followed by Dr Kellie Simmering   GERD (gastroesophageal reflux disease)    Hyperlipidemia    Hypertension    Hypothyroidism    Past Surgical History:  Procedure Laterality Date   APPENDECTOMY     IR IMAGING GUIDED PORT INSERTION  03/02/2021    Family History  Problem Relation Age of Onset   Cancer Mother    Coronary artery disease Father    Coronary artery disease Maternal Grandmother    Social History   Socioeconomic History   Marital status: Married    Spouse name: Not on file   Number of children: Not on file   Years of education: Not on file   Highest education level: Not on file  Occupational History   Occupation: Field seismologist: [employer]  Tobacco Use   Smoking status: Former    Packs/day: 1.00    Years: 40.00    Pack years: 40.00    Types: Cigarettes    Quit date: 05/11/2009    Years since quitting: 11.9   Smokeless tobacco: Never  Substance and Sexual Activity   Alcohol use: Not Currently   Drug use: No   Sexual activity: Not Currently  Other Topics Concern   Not on file  Social History Narrative   Not on file   Social Determinants of Health   Financial Resource Strain: Low Risk    Difficulty of Paying Living Expenses: Not hard at all  Food Insecurity: No Food Insecurity   Worried About Charity fundraiser in the Last Year: Never true   [place] in the Last Year: Never true  Transportation Needs: No Transportation Needs   Lack of Transportation (Medical): No   Lack of Transportation (Non-Medical): No  Physical Activity: Not on file  Stress: No Stress Concern Present    Feeling of Stress : Only a little  Social Connections: Engineer, building services of Communication with Friends and Family: More than three times a week   Frequency of Social Gatherings with Friends and Family: More than three times a week   Attends Religious Services: More than 4 times per year   Active Member of [employer] or Organizations: Yes   Attends Music therapist: More than 4 times per year   Marital Status: Married    Review of Systems CONSTITUTIONAL: see HPI E/N/T: see HPI CARDIOVASCULAR: Negative for chest pain, dizziness, palpitations and  pedal edema.  RESPIRATORY: Negative for recent cough and dyspnea.  GASTROINTESTINAL: Negative for abdominal pain, acid reflux symptoms, constipation, diarrhea, nausea and vomiting.  INTEGUMENTARY: Negative for rash.  PSYCHIATRIC: Negative for sleep disturbance and to question depression screen.  Negative for depression, negative for anhedonia.       Objective:  BP 132/86 (BP Location: Left Arm, Patient Position: Sitting, Cuff Size: Normal)   Pulse 90   Temp (!) 97.3 F (36.3 C) (Temporal)   Ht 5\' 6"  (1.676 m)   Wt 158 lb (71.7 kg)   SpO2 95%   BMI 25.50 kg/m   BP/Weight 04/20/2021 04/14/2021 7/34/2876  Systolic BP 811 572 620  Diastolic BP 86 54 44  Wt. (Lbs) 158 157.1 160  BMI 25.5 25.36 25.82    Physical Exam PHYSICAL EXAM:   VS: BP 132/86 (BP Location: Left Arm, Patient Position: Sitting, Cuff Size: Normal)   Pulse 90   Temp (!) 97.3 F (36.3 C) (Temporal)   Ht 5\' 6"  (1.676 m)   Wt 158 lb (71.7 kg)   SpO2 95%   BMI 25.50 kg/m   GEN: Well nourished, well developed, in no acute distress  Oropharynx - mucosa with abnormality/growth on lower jaw Neck: no JVD or masses - no thyromegaly Cardiac: RRR; no murmurs, rubs, or gallops,no edema - no significant varicosities Respiratory:  normal respiratory rate and pattern with no distress - normal breath sounds with no rales, rhonchi, wheezes or rubs MS: no  deformity or atrophy  Skin: warm and dry, no rash  Neuro:  Alert and Oriented x 3, Strength and sensation are intact - CN II-Xii grossly intact Psych: euthymic mood, appropriate affect and demeanor   Diabetic Foot Exam - Simple   No data filed      Lab Results  Component Value Date   WBC 1.0 (L) 04/14/2021   HGB 10.5 (L) 04/14/2021   HCT 31.5 (L) 04/14/2021   PLT 110 (L) 04/14/2021   GLUCOSE 126 (H) 04/14/2021   CHOL 132 12/29/2020   TRIG 95 12/29/2020   HDL 59 12/29/2020   LDLCALC 55 12/29/2020   ALT 24 12/08/2020   AST 39 12/08/2020   NA 133 (L) 04/14/2021   K 4.4 04/14/2021   CL 95 (L) 04/14/2021   CREATININE 0.72 04/14/2021   BUN 21 04/14/2021   CO2 28 04/14/2021   TSH 0.655 03/09/2021   INR 0.99 07/16/2009   HGBA1C  07/15/2009    5.1 (NOTE) The ADA recommends the following therapeutic goal for glycemic control related to Hgb A1c measurement: Goal of therapy: <6.5 Hgb A1c  Reference: American Diabetes Association: Clinical Practice Recommendations 2010, Diabetes Care, 2010, 33: (Suppl  1).      Assessment & Plan:   1. Acquired hypothyroidism - TSH Continue meds as directed 2. Mixed hyperlipidemia Continue meds and watch diet Labwork pending 3. Hypertension, essential Continue meds Labwork pending 4. Anxiety Continue meds 5. 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HCC) Continue current follow up with specialists  Meds ordered this encounter  Medications   traZODone (DESYREL) 50 MG tablet    Sig: Take 1 tablet (50 mg total) by mouth at bedtime.    Dispense:  90 tablet    Refill:  1    Order Specific Question:   Supervising Provider    AnswerShelton Silvas     Orders Placed This Encounter  Procedures   CBC with Differential/Platelet   Comprehensive metabolic panel   Lipid panel  TSH       Follow-up: Return in about 3 months (around 07/21/2021) for chronic fasting follow up.  An After Visit Summary was printed and given to the  patient.  Yetta Flock [facility] [phone]

## 2021-04-21 ENCOUNTER — Inpatient Hospital Stay: Payer: Medicare Other

## 2021-04-21 ENCOUNTER — Other Ambulatory Visit: Payer: Self-pay

## 2021-04-21 ENCOUNTER — Encounter: Payer: Self-pay | Admitting: Hematology and Oncology

## 2021-04-21 ENCOUNTER — Telehealth (HOSPITAL_COMMUNITY): Payer: Self-pay | Admitting: *Deleted

## 2021-04-21 ENCOUNTER — Inpatient Hospital Stay: Payer: Medicare Other | Admitting: Dietician

## 2021-04-21 ENCOUNTER — Inpatient Hospital Stay: Payer: Medicare Other | Admitting: Hematology and Oncology

## 2021-04-21 DIAGNOSIS — C060 Malignant neoplasm of cheek mucosa: Secondary | ICD-10-CM

## 2021-04-21 DIAGNOSIS — T451X5D Adverse effect of antineoplastic and immunosuppressive drugs, subsequent encounter: Secondary | ICD-10-CM

## 2021-04-21 DIAGNOSIS — Z87891 Personal history of nicotine dependence: Secondary | ICD-10-CM | POA: Diagnosis not present

## 2021-04-21 DIAGNOSIS — D701 Agranulocytosis secondary to cancer chemotherapy: Secondary | ICD-10-CM

## 2021-04-21 DIAGNOSIS — Z5111 Encounter for antineoplastic chemotherapy: Secondary | ICD-10-CM | POA: Diagnosis not present

## 2021-04-21 DIAGNOSIS — K1231 Oral mucositis (ulcerative) due to antineoplastic therapy: Secondary | ICD-10-CM

## 2021-04-21 DIAGNOSIS — E039 Hypothyroidism, unspecified: Secondary | ICD-10-CM | POA: Diagnosis not present

## 2021-04-21 DIAGNOSIS — T451X5A Adverse effect of antineoplastic and immunosuppressive drugs, initial encounter: Secondary | ICD-10-CM

## 2021-04-21 LAB — COMPREHENSIVE METABOLIC PANEL
ALT: 21 IU/L (ref 0–32)
AST: 47 IU/L — ABNORMAL HIGH (ref 0–40)
Albumin/Globulin Ratio: 1.1 — ABNORMAL LOW (ref 1.2–2.2)
Albumin: 3.4 g/dL — ABNORMAL LOW (ref 3.8–4.8)
Alkaline Phosphatase: 89 IU/L (ref 44–121)
BUN/Creatinine Ratio: 31 — ABNORMAL HIGH (ref 12–28)
BUN: 21 mg/dL (ref 8–27)
Bilirubin Total: <0.2 mg/dL (ref 0.0–1.2)
CO2: 18 mmol/L — ABNORMAL LOW (ref 20–29)
Calcium: 8.6 mg/dL — ABNORMAL LOW (ref 8.7–10.3)
Chloride: 93 mmol/L — ABNORMAL LOW (ref 96–106)
Creatinine, Ser: 0.68 mg/dL (ref 0.57–1.00)
Globulin, Total: 3 g/dL (ref 1.5–4.5)
Glucose: 77 mg/dL (ref 70–99)
Sodium: 134 mmol/L (ref 134–144)
Total Protein: 6.4 g/dL (ref 6.0–8.5)
eGFR: 95 mL/min/{1.73_m2} (ref >59)

## 2021-04-21 LAB — CBC WITH DIFFERENTIAL/PLATELET
Basophils Absolute: 0 10*3/uL (ref 0.0–0.2)
Basos: 1 %
EOS (ABSOLUTE): 0 10*3/uL (ref 0.0–0.4)
Eos: 0 %
Hematocrit: 30.9 % — ABNORMAL LOW (ref 34.0–46.6)
Hemoglobin: 10 g/dL — ABNORMAL LOW (ref 11.1–15.9)
Immature Grans (Abs): 0 10*3/uL (ref 0.0–0.1)
Immature Granulocytes: 1 %
Lymphocytes Absolute: 0.2 10*3/uL — ABNORMAL LOW (ref 0.7–3.1)
Lymphs: 7 %
MCH: 28.1 pg (ref 26.6–33.0)
MCHC: 32.4 g/dL (ref 31.5–35.7)
MCV: 87 fL (ref 79–97)
Monocytes Absolute: 0.7 10*3/uL (ref 0.1–0.9)
Monocytes: 21 %
NRBC: 1 % — ABNORMAL HIGH (ref 0–0)
Neutrophils Absolute: 2.3 10*3/uL (ref 1.4–7.0)
Neutrophils: 70 %
Platelets: 218 10*3/uL (ref 150–450)
RBC: 3.56 x10E6/uL — ABNORMAL LOW (ref 3.77–5.28)
RDW: 16 % — ABNORMAL HIGH (ref 11.7–15.4)
WBC: 3.2 10*3/uL — ABNORMAL LOW (ref 3.4–10.8)

## 2021-04-21 LAB — CBC WITH DIFFERENTIAL (CANCER CENTER ONLY)
Abs Immature Granulocytes: 0.01 10*3/uL (ref 0.00–0.07)
Basophils Absolute: 0 10*3/uL (ref 0.0–0.1)
Basophils Relative: 0 %
Eosinophils Absolute: 0 10*3/uL (ref 0.0–0.5)
Eosinophils Relative: 0 %
HCT: 26.5 % — ABNORMAL LOW (ref 36.0–46.0)
Hemoglobin: 8.7 g/dL — ABNORMAL LOW (ref 12.0–15.0)
Immature Granulocytes: 0 %
Lymphocytes Relative: 9 %
Lymphs Abs: 0.2 10*3/uL — ABNORMAL LOW (ref 0.7–4.0)
MCH: 28.8 pg (ref 26.0–34.0)
MCHC: 32.8 g/dL (ref 30.0–36.0)
MCV: 87.7 fL (ref 80.0–100.0)
Monocytes Absolute: 0.5 10*3/uL (ref 0.1–1.0)
Monocytes Relative: 19 %
Neutro Abs: 2 10*3/uL (ref 1.7–7.7)
Neutrophils Relative %: 72 %
Platelet Count: 225 10*3/uL (ref 150–400)
RBC: 3.02 MIL/uL — ABNORMAL LOW (ref 3.87–5.11)
RDW: 17 % — ABNORMAL HIGH (ref 11.5–15.5)
Smear Review: NORMAL
WBC Count: 2.8 10*3/uL — ABNORMAL LOW (ref 4.0–10.5)
nRBC: 0.7 % — ABNORMAL HIGH (ref 0.0–0.2)

## 2021-04-21 LAB — LIPID PANEL
Chol/HDL Ratio: 2.6 ratio (ref 0.0–4.4)
Cholesterol, Total: 144 mg/dL (ref 100–199)
HDL: 55 mg/dL (ref >39)
LDL Chol Calc (NIH): 68 mg/dL (ref 0–99)
Triglycerides: 119 mg/dL (ref 0–149)
VLDL Cholesterol Cal: 21 mg/dL (ref 5–40)

## 2021-04-21 LAB — BASIC METABOLIC PANEL - CANCER CENTER ONLY
Anion gap: 10 (ref 5–15)
BUN: 23 mg/dL (ref 8–23)
CO2: 27 mmol/L (ref 22–32)
Calcium: 9.2 mg/dL (ref 8.9–10.3)
Chloride: 100 mmol/L (ref 98–111)
Creatinine: 0.65 mg/dL (ref 0.44–1.00)
GFR, Estimated: >60 mL/min (ref >60)
Glucose, Bld: 186 mg/dL — ABNORMAL HIGH (ref 70–99)
Potassium: 4.5 mmol/L (ref 3.5–5.1)
Sodium: 137 mmol/L (ref 135–145)

## 2021-04-21 LAB — CARDIOVASCULAR RISK ASSESSMENT

## 2021-04-21 LAB — TSH: TSH: 1.08 u[IU]/mL (ref 0.450–4.500)

## 2021-04-21 LAB — MAGNESIUM: Magnesium: 1.8 mg/dL (ref 1.7–2.4)

## 2021-04-21 MED ORDER — SODIUM CHLORIDE 0.9 % IV SOLN
150.0000 mg | Freq: Once | INTRAVENOUS | Status: AC
  Administered 2021-04-21: 150 mg via INTRAVENOUS
  Filled 2021-04-21: qty 150

## 2021-04-21 MED ORDER — SODIUM CHLORIDE 0.9 % IV SOLN: Freq: Once | INTRAVENOUS | Status: AC

## 2021-04-21 MED ORDER — SODIUM CHLORIDE 0.9% FLUSH
10.0000 mL | INTRAVENOUS | Status: DC | PRN
  Administered 2021-04-21: 10 mL

## 2021-04-21 MED ORDER — PALONOSETRON HCL INJECTION 0.25 MG/5ML
0.2500 mg | Freq: Once | INTRAVENOUS | Status: AC
  Administered 2021-04-21: 0.25 mg via INTRAVENOUS
  Filled 2021-04-21: qty 5

## 2021-04-21 MED ORDER — SODIUM CHLORIDE 0.9 % IV SOLN
10.0000 mg | Freq: Once | INTRAVENOUS | Status: AC
  Administered 2021-04-21: 10 mg via INTRAVENOUS
  Filled 2021-04-21: qty 10

## 2021-04-21 MED ORDER — SODIUM CHLORIDE 0.9 % IV SOLN
40.0000 mg/m2 | Freq: Once | INTRAVENOUS | Status: AC
  Administered 2021-04-21: 74 mg via INTRAVENOUS
  Filled 2021-04-21: qty 74

## 2021-04-21 MED ORDER — MAGNESIUM SULFATE 2 GM/50ML IV SOLN
2.0000 g | Freq: Once | INTRAVENOUS | Status: AC
  Administered 2021-04-21: 2 g via INTRAVENOUS
  Filled 2021-04-21: qty 50

## 2021-04-21 MED ORDER — POTASSIUM CHLORIDE IN NACL 20-0.9 MEQ/L-% IV SOLN
INTRAVENOUS | Status: DC
  Filled 2021-04-21 (×2): qty 1000

## 2021-04-21 MED ORDER — HEPARIN SOD (PORK) LOCK FLUSH 100 UNIT/ML IV SOLN
500.0000 [IU] | Freq: Once | INTRAVENOUS | Status: AC | PRN
  Administered 2021-04-21: 500 [IU]

## 2021-04-21 NOTE — Progress Notes (Signed)
Nutrition Follow-up:  Patient receiving concurrent adjuvant chemoradiation for SCC of oral cavity. S/p G-tube replacement at [facility] on 8/5. Patient completed radiation therapy on 10/10.  Met with patient during infusion. She reports not expecting to have treatment today and did not bring carton of Osmolite for afternoon feeding. Patient requesting a carton of Osmolite and syringe. Patient reports tolerating tube feedings, she continues to give 6 cartons/day split over four feedings. She is not drinking fluids by mouth, reports taking small sips of water but not enough to quench thirst. She reports thick saliva. Patient using baking soda salt water rinses 4-5 times/day. This has been working well. Patient denies nausea, vomiting, diarrhea. Reports constipation has improved, takes Miralax as needed. Her migraines have resolved after discontinuing oxycodone. Patient appreciative of tube feeding supplies left at registration desk last week. She reports being contacted by new DME ([employer]) and will begin receiving formula/supplies next month. Patient reports having enough formula/supplies at this time.   Medications: Morphine concentrate, xylocaine  Labs: Glucose 186  Anthropometrics: Weight 160 lb 2 oz today increased from 157 lb 1.6 oz on 10/5  9/21 - 157 lb 9/14 - 158 lb 9/7 - 161 lb 7 oz   NUTRITION DIAGNOSIS: Inadequate oral intake ongoing, pt relying on tube feedings   INTERVENTION:  Continue 6 cartons Osmolite 1.5 via tube split over 4 feedings/day. Flush with 90 ml water before and after each feeding. Drink by mouth or give via tube additional 2 cups fluids daily.  Encouraged oral intake as tolerated Continue baking soda, salt water rinses several times daily Continue lidocaine mouth rinses Patient has contact information    MONITORING, EVALUATION, GOAL: Weight trends, intake, tube feedings   NEXT VISIT: Wednesday October 26 in clinic

## 2021-04-21 NOTE — Progress Notes (Signed)
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  Her pathologic staging is pT3 N3B. Given positive margins and presence of extracapsular extension, we agreed with adjuvant chemoradiation. She completed 5 weekly cycles of cisplatin concurrent with radiation. She is here for follow-up.  Review of systems pertinent for ongoing fatigue, mucositis Physical examination, she appears better, erythema of neck with crusting, drooling saliva, cannot open her mouth quite wide.  Have reviewed the labs today, neutropenia improved, anemia worsened but no indication for transfusion, no thrombocytopenia. She is willing to completed planned C6 today. If labs are satisfactory, ok to proceed with chemo today.  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 chemotherapy ([place]) Improved compared to last visit. Ok to proceed with chemo if rest of the pending labs appear satisfactory  Mucositis due to antineoplastic therapy Moderate to severe pain Encouraged to continue morphine as prescribed. This has improved a bit compared to last week's visit.   HISTORY OF PRESENTING ILLNESS:   Tammy Brady 70 y.o. female is here because of  SCC oral  mucosa  This is a pleasant 70 year old female patient who first presented to her dentist with a painful lesion to her gums noticed more like at the end of 2021.  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  Subsequently she was referred to Dr. Amada Jupiter on December 14, 2020 who discussed surgical intervention with patient.  She had neck CT which showed an enhancing soft tissue lesion along the buccal margin of left mandible with a lucency in the adjacent bone consistent with known SCC.  She was also found to have necrotic left level 1A/PN2/3B lymph nodes noted to be concerning for nodal metastasis.  She underwent resection on January 18, 2021 which revealed 3.3 cm tumor, grade 2 squamous cell carcinoma of the alveolar ridge extending to buccal gingival junction and lingual sulcus, depth of invasion 11 mm, negative margins, 6 out of 49 lymph nodes positive bilaterally with extracapsular extension, no perineural invasion or lymphovascular invasion.  She is now referred to medical oncology for consideration of adjuvant chemoradiation.  PATHOLOGY REPORT: Diagnosis     A: Mouth, RMT mucosal margin, excision - Benign squamous mucosa and submucosa with no tumor seen.   B: Mouth, left buccal margin, excision - Benign squamous mucosa and submucosa with no tumor seen.   C: Mouth, labial mucosal margin, excision - Benign squamous mucosa and submucosa with no tumor seen.   D: Mouth, anterior gingival mucosal margin, excision - Benign squamous mucosa and submucosa with no tumor seen.   E: Mouth, floor of mouth, biopsy - Benign squamous mucosa and submucosa with no tumor seen.   F: Tongue, ventral tongue, biopsy - Benign squamous mucosa and submucosa with no tumor seen.   G:  Bone marrow, anterior mandible, biopsy - Fragment of blood clot, no tumor seen, on original frozen section only, tissue has been cut through on permanent sections.   H: Bone marrow,  posterior mandible, biopsy - Fragment of blood clot, no tumor seen, on original frozen section only, tissue has been cut through on permanent sections.   I: Lymph node, left neck level 1B and level 1A, lymphadenectomy - Metastatic squamous cell carcinoma with cystic change, involving 2 of 4 lymph nodes (2/4), size of largest metastasis 2.2 cm. - Benign submandibular gland is also present.   J: Lymph node, right level 1B, lymphadenectomy - Metastatic squamous cell carcinoma involving 2 of 4 lymph nodes (2/4), with cystic change, size of largest metastasis 2.0 cm in greatest dimension, with extranodal extension of tumor identified. - Submandibular gland present with no tumor seen.   K: Lymph node, left EJ, excision - No tumor seen in three lymph nodes (0/3).   L: Lymph node, left neck, level 2, lymphadenectomy - No tumor seen in 14 lymph nodes (0/14).   M: Lymph node, left neck, level 3, lymphadenectomy - Metastatic squamous cell carcinoma involving 1 of 7 lymph nodes (1/7), size of metastasis 2.2 cm, with extranodal extension of tumor identified.   N: Lymph node, left neck, level 4, lymphadenectomy - Metastatic squamous cell carcinoma involving 1 of 2 lymph nodes (1/2), size of metastasis 1 mm.   O: Lymph node, right neck, level 2, lymphadenectomy - No tumor seen in eight lymph nodes (0/8).   P: Lymph node, right neck, level 3, lymphadenectomy - No tumor seen in four lymph nodes (0/4).   Q: Lymph node, right neck, level 4, lymphadenectomy - No tumor seen in three lymph nodes (0/3).   R: Left composite mandibular resection (segmental mandibulectomy and resection of submental gland) - Squamous cell carcinoma, moderately differentiated, of alveolar ridge extending to buccal gingival junction and lingual sulcus. - Tumor size 3.3 cm in greatest lateral dimension, depth of invasion 1.1 cm. - Tumor involves the alveolar bone and does not exceed the depth of the roots of the teeth, and does  not extend into the body of the mandible.  - Surgical margins free of tumor (soft tissue and bone margins all negative for tumor) - Submental gland present with no tumor seen.   S: Teeth, extraction - Multiple intact and fragmented teeth (gross examination only).    Given positive margin and extracapsular extension, we have discussed about adjuvant chemoradiation with weekly cisplatin.  Started weekly cisplatin on 03/10/2021. Cycle 2-day 1 on 03/17/2021 Cycle 3-day 1 on 03/24/2021. Cycle 4 day 1 on 03/31/2021 Cycle 5 day 1 on 04/07/2021  Interval history   Patient is here for follow-up prior to cycle 6 of cisplatin.  She is doing better than last week but still feels very tired. Mucositis improving, pain better with morphine. She seems to be tolerating morphine better, no headaches on morphine She has some ear pain, popping but no hearing loss No neuropathy No change in breathing, bowel habits or urinary habits No neurological complaints. All feeding through G tube. Rest of the pertinent 10 point ROS reviewed and negative.   MEDICAL HISTORY:  Past Medical History:  Diagnosis Date   CAD (coronary artery disease)    Mild non-obstructive disease by cath January 2011   Carotid artery disease ([place])    Followed by Dr Kellie Simmering   GERD (gastroesophageal reflux disease)    Hyperlipidemia    Hypertension    Hypothyroidism  SURGICAL HISTORY: Past Surgical History:  Procedure Laterality Date   APPENDECTOMY     IR IMAGING GUIDED PORT INSERTION  03/02/2021    SOCIAL HISTORY: Social History   Socioeconomic History   Marital status: Married    Spouse name: Not on file   Number of children: Not on file   Years of education: Not on file   Highest education level: Not on file  Occupational History   Occupation: Dealer    Employer: [employer]  Tobacco Use   Smoking status: Former    Packs/day: 1.00    Years: 40.00    Pack years: 40.00    Types: Cigarettes    Quit date:  05/11/2009    Years since quitting: 11.9   Smokeless tobacco: Never  Substance and Sexual Activity   Alcohol use: Not Currently   Drug use: No   Sexual activity: Not Currently  Other Topics Concern   Not on file  Social History Narrative   Not on file   Social Determinants of Health   Financial Resource Strain: Low Risk    Difficulty of Paying Living Expenses: Not hard at all  Food Insecurity: No Food Insecurity   Worried About Charity fundraiser in the Last Year: Never true   [employer] in the Last Year: Never true  Transportation Needs: No Transportation Needs   Lack of Transportation (Medical): No   Lack of Transportation (Non-Medical): No  Physical Activity: Not on file  Stress: No Stress Concern Present   Feeling of Stress : Only a little  Social Connections: Engineer, building services of Communication with Friends and Family: More than three times a week   Frequency of Social Gatherings with Friends and Family: More than three times a week   Attends Religious Services: More than 4 times per year   Active Member of [employer] or Organizations: Yes   Attends Music therapist: More than 4 times per year   Marital Status: Married  Human resources officer Violence: Not on file    FAMILY HISTORY: Family History  Problem Relation Age of Onset   Cancer Mother    Coronary artery disease Father    Coronary artery disease Maternal Grandmother     ALLERGIES:  is allergic to codeine.  MEDICATIONS:  Current Outpatient Medications  Medication Sig Dispense Refill   acetaminophen (TYLENOL) 500 MG tablet Take 500 mg by mouth every 6 (six) hours as needed.     aspirin 325 MG tablet Take 650 mg by mouth as needed for mild pain or moderate pain.     calcium carbonate (OS-CAL) 600 MG TABS Take 600 mg by mouth daily.     dexamethasone (DECADRON) 4 MG tablet Take 2 tablets (8 mg total) by mouth daily. Take daily x 3 days starting the day after cisplatin chemotherapy. Take  with food. 30 tablet 1   diltiazem (CARDIZEM LA) 360 MG 24 hr tablet Take 1 tablet (360 mg total) by mouth daily. 30 tablet 3   gabapentin (NEURONTIN) 300 MG capsule Take 1 capsule by mouth 3 (three) times daily.     ibuprofen (ADVIL) 600 MG tablet Take 1 tablet by mouth every 6 (six) hours as needed.     levothyroxine (SYNTHROID) 112 MCG tablet TAKE 1 TABLET BY MOUTH  DAILY 90 tablet 0   lidocaine (XYLOCAINE) 2 % solution Patient: Mix 1part 2% viscous lidocaine, 1part H20. Swish & swallow 81mL of diluted mixture, 50min before meals and at bedtime,  up to QID 200 mL 4   lidocaine-prilocaine (EMLA) cream Apply to affected area once 30 g 3   Morphine Sulfate (MORPHINE CONCENTRATE) 10 mg / 0.5 ml concentrated solution Take 0.25-0.5 mLs (5-10 mg total) by mouth every 6 (six) hours as needed for severe pain. 30 mL 0   Multiple Vitamin (MULTIVITAMIN) tablet Take 1 tablet by mouth daily.     nitroGLYCERIN (NITROSTAT) 0.4 MG SL tablet Place 1 tablet (0.4 mg total) under the tongue every 5 (five) minutes as needed. 25 tablet 6   Nutritional Supplements (FEEDING SUPPLEMENT, OSMOLITE 1.5 CAL,) LIQD Place into feeding tube daily. 4-5 cartons daily     omeprazole (PRILOSEC) 20 MG capsule Take 1 capsule (20 mg total) by mouth daily. 30 capsule 1   ondansetron (ZOFRAN) 8 MG tablet Take 1 tablet (8 mg total) by mouth 2 (two) times daily as needed. Start on the third day after cisplatin chemotherapy. 30 tablet 1   Polyethylene Glycol 3350 (MIRALAX PO) Take by mouth as needed.     prochlorperazine (COMPAZINE) 10 MG tablet Take 1 tablet (10 mg total) by mouth every 6 (six) hours as needed (Nausea or vomiting). 30 tablet 1   simvastatin (ZOCOR) 40 MG tablet TAKE 1 TABLET BY MOUTH AT  BEDTIME 90 tablet 0   traZODone (DESYREL) 50 MG tablet Take 1 tablet (50 mg total) by mouth at bedtime. 90 tablet 1   metoprolol tartrate (LOPRESSOR) 50 MG tablet Take 1 tablet (50 mg total) by mouth once for 1 dose. Take 1 tablet two hours  prior to CT scan. 1 tablet 0   No current facility-administered medications for this visit.   Facility-Administered Medications Ordered in Other Visits  Medication Dose Route Frequency Provider Last Rate Last Admin   0.9 %  sodium chloride infusion   Intravenous Once Trinidy Masterson, MD       0.9 % NaCl with KCl 20 mEq/ L  infusion   Intravenous Continuous Madalyn Legner, MD       CISplatin (PLATINOL) 74 mg in sodium chloride 0.9 % 250 mL chemo infusion  40 mg/m2 (Treatment Plan Recorded) Intravenous Once Raymond Azure, Arletha Pili, MD       dexamethasone (DECADRON) 10 mg in sodium chloride 0.9 % 50 mL IVPB  10 mg Intravenous Once Dajha Urquilla, MD       fosaprepitant (EMEND) 150 mg in sodium chloride 0.9 % 145 mL IVPB  150 mg Intravenous Once Harlem Thresher, MD       heparin lock flush 100 unit/mL  500 Units Intracatheter Once PRN Lurie Mullane, MD       magnesium sulfate IVPB 2 g 50 mL  2 g Intravenous Once Vearl Aitken, MD       palonosetron (ALOXI) injection 0.25 mg  0.25 mg Intravenous Once Lacye Mccarn, MD       sodium chloride flush (NS) 0.9 % injection 10 mL  10 mL Intracatheter PRN Mishaal Lansdale, MD        PHYSICAL EXAMINATION:  ECOG PERFORMANCE STATUS: 0 - Asymptomatic  Vitals:   04/21/21 0849  BP: (!) 122/57  Pulse: 82  Resp: 18   Physical exam  Genera appearance: She appears tired without any acute respiratory distress. Head and neck exam: Skin over the head and neck appears very erythematous with some crusting, expected from radiation She was drooling saliva.  Patient cannot swallow anything. Mucositis noted, she cant open her mouth wide, no large ulcers. Chest: CTA bilaterally Heart: S1, S2, RRR Abdomen: G  tube site intact Extremities: No LE edema.   LABORATORY DATA:  I have reviewed the data as listed Lab Results  Component Value Date   WBC 2.8 (L) 04/21/2021   HGB 8.7 (L) 04/21/2021   HCT 26.5 (L) 04/21/2021   MCV 87.7 04/21/2021   PLT 225 04/21/2021      Chemistry      Component Value Date/Time   NA 137 04/21/2021 0833   NA 142 12/08/2020 1356   K 4.5 04/21/2021 0833   CL 100 04/21/2021 0833   CO2 27 04/21/2021 0833   BUN 23 04/21/2021 0833   BUN 15 12/08/2020 1356   CREATININE 0.65 04/21/2021 0833      Component Value Date/Time   CALCIUM 9.2 04/21/2021 0833   ALKPHOS 84 12/08/2020 1356   AST 39 12/08/2020 1356   ALT 24 12/08/2020 1356   BILITOT <0.2 12/08/2020 1356     Have reviewed pertinent labs, CBC satisfactory CMP and mag pending at the time of my visit.  RADIOGRAPHIC STUDIES: I have personally reviewed the radiological images as listed and agreed with the findings in the report. No results found.  All questions were answered. The patient knows to call the clinic with any problems, questions or concerns. I spent 30 minutes in the care of this patient including H and P, review of records, counseling and coordination of care.    Benay Pike, MD 04/21/2021 9:33 AM

## 2021-04-21 NOTE — Assessment & Plan Note (Signed)
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  Her pathologic staging is pT3 N3B. Given positive margins and presence of extracapsular extension, we agreed with adjuvant chemoradiation. She completed 5 weekly cycles of cisplatin concurrent with radiation. She is here for follow-up.  Review of systems pertinent for ongoing fatigue, mucositis Physical examination, she appears better, erythema of neck with crusting, drooling saliva, cannot open her mouth quite wide.  Have reviewed the labs today, neutropenia improved, anemia worsened but no indication for transfusion, no thrombocytopenia. She is willing to completed planned C6 today. If labs are satisfactory, ok to proceed with chemo today.

## 2021-04-21 NOTE — Telephone Encounter (Signed)
Attempted to call patient regarding upcoming cardiac CT appointment. °Left message on voicemail with name and callback number ° °Chrissie Dacquisto RN Navigator Cardiac Imaging °[facility] °[phone] Office °[phone] Cell ° °

## 2021-04-21 NOTE — Patient Instructions (Signed)
[facility] MEDICAL [facility]COLOGY  Discharge Instructions: Thank you for choosing [facility] to provide your oncology and hematology care.   If you have a lab appointment with the Cancer Center, please go directly to the Cancer Center and check in at the registration area.   Wear comfortable clothing and clothing appropriate for easy access to any Portacath or PICC line.   We strive to give you quality time with your provider. You may need to reschedule your appointment if you arrive late (15 or more minutes).  Arriving late affects you and other patients whose appointments are after yours.  Also, if you miss three or more appointments without notifying the office, you may be dismissed from the clinic at the provider's discretion.      For prescription refill requests, have your pharmacy contact our office and allow 72 hours for refills to be completed.    Today you received the following chemotherapy and/or immunotherapy agents : Cisplatin    To help prevent nausea and vomiting after your treatment, we encourage you to take your nausea medication as directed.  BELOW ARE SYMPTOMS THAT SHOULD BE REPORTED IMMEDIATELY: *FEVER GREATER THAN 100.4 F (38 C) OR HIGHER *CHILLS OR SWEATING *NAUSEA AND VOMITING THAT IS NOT CONTROLLED WITH YOUR NAUSEA MEDICATION *UNUSUAL SHORTNESS OF BREATH *UNUSUAL BRUISING OR BLEEDING *URINARY PROBLEMS (pain or burning when urinating, or frequent urination) *BOWEL PROBLEMS (unusual diarrhea, constipation, pain near the anus) TENDERNESS IN MOUTH AND THROAT WITH OR WITHOUT PRESENCE OF ULCERS (sore throat, sores in mouth, or a toothache) UNUSUAL RASH, SWELLING OR PAIN  UNUSUAL VAGINAL DISCHARGE OR ITCHING   Items with * indicate a potential emergency and should be followed up as soon as possible or go to the Emergency Department if any problems should occur.  Please show the CHEMOTHERAPY ALERT CARD or IMMUNOTHERAPY ALERT CARD at check-in to  the Emergency Department and triage nurse.  Should you have questions after your visit or need to cancel or reschedule your appointment, please contact [facility]  Dept: [phone]  and follow the prompts.  Office hours are 8:00 a.m. to 4:30 p.m. Monday - Friday. Please note that voicemails left after 4:00 p.m. may not be returned until the following business day.  We are closed weekends and major holidays. You have access to a nurse at all times for urgent questions. Please call the main number to the clinic Dept: [phone] and follow the prompts.   For any non-urgent questions, you may also contact your provider using MyChart. We now offer e-Visits for anyone 18 and older to request care online for non-urgent symptoms. For details visit [email or web address].   Also download the MyChart app! Go to the app store, search "MyChart", open the app, select , and log in with your MyChart username and password.  Due to Covid, a mask is required upon entering the hospital/clinic. If you do not have a mask, one will be given to you upon arrival. For doctor visits, patients may have 1 support person aged 18 or older with them. For treatment visits, patients cannot have anyone with them due to current Covid guidelines and our immunocompromised population.   

## 2021-04-21 NOTE — Assessment & Plan Note (Signed)
Improved compared to last visit. Ok to proceed with chemo if rest of the pending labs appear satisfactory

## 2021-04-21 NOTE — Assessment & Plan Note (Signed)
Moderate to severe pain Encouraged to continue morphine as prescribed. This has improved a bit compared to last week's visit.

## 2021-04-22 ENCOUNTER — Encounter: Payer: Medicare Other | Admitting: Rehabilitation

## 2021-04-22 ENCOUNTER — Ambulatory Visit (HOSPITAL_COMMUNITY)
Admission: RE | Admit: 2021-04-22 | Discharge: 2021-04-22 | Disposition: A | Discharge status: Home / Self Care | Payer: Medicare Other | Source: Ambulatory Visit | Attending: Cardiology | Admitting: Cardiology

## 2021-04-22 DIAGNOSIS — R072 Precordial pain: Secondary | ICD-10-CM | POA: Insufficient documentation

## 2021-04-22 MED ORDER — HEPARIN SOD (PORK) LOCK FLUSH 100 UNIT/ML IV SOLN
500.0000 [IU] | INTRAVENOUS | Status: AC | PRN
  Administered 2021-04-22: 500 [IU]
  Filled 2021-04-22: qty 5

## 2021-04-22 MED ORDER — IOHEXOL 350 MG/ML SOLN
95.0000 mL | Freq: Once | INTRAVENOUS | Status: AC | PRN
  Administered 2021-04-22: 95 mL via INTRAVENOUS

## 2021-04-22 MED ORDER — NITROGLYCERIN 0.4 MG SL SUBL: 0.8000 mg | SUBLINGUAL_TABLET | Freq: Once | SUBLINGUAL | Status: AC

## 2021-04-22 MED ORDER — NITROGLYCERIN 0.4 MG SL SUBL
SUBLINGUAL_TABLET | SUBLINGUAL | Status: AC
  Administered 2021-04-22: 0.8 mg via SUBLINGUAL
  Filled 2021-04-22: qty 2

## 2021-04-23 ENCOUNTER — Ambulatory Visit: Payer: Medicare Other | Attending: Radiation Oncology

## 2021-04-23 ENCOUNTER — Other Ambulatory Visit: Payer: Self-pay

## 2021-04-23 DIAGNOSIS — R471 Dysarthria and anarthria: Secondary | ICD-10-CM | POA: Insufficient documentation

## 2021-04-23 DIAGNOSIS — R1311 Dysphagia, oral phase: Secondary | ICD-10-CM | POA: Insufficient documentation

## 2021-04-23 NOTE — Therapy (Signed)
[facility] [address] [place], [place], [address] Phone: [phone]   Fax:  [phone]  Speech Language Pathology Treatment  Patient Details  Name: Tammy Brady MRN: [id number] Date of Birth: 23-Apr-1951 Referring Provider (SLP): Eppie Gibson, MD   Encounter Date: 04/23/2021   End of Session - 04/23/21 1717     Visit Number 2    Number of Visits 4    Date for SLP Re-Evaluation 06/09/21    SLP Start Time 73    SLP Stop Time  71    SLP Time Calculation (min) 36 min    Activity Tolerance Patient tolerated treatment well;Patient limited by pain             Past Medical History:  Diagnosis Date   CAD (coronary artery disease)    Mild non-obstructive disease by cath January 2011   Carotid artery disease ([place])    Followed by Dr Kellie Simmering   GERD (gastroesophageal reflux disease)    Hyperlipidemia    Hypertension    Hypothyroidism     Past Surgical History:  Procedure Laterality Date   APPENDECTOMY     IR IMAGING GUIDED PORT INSERTION  03/02/2021    There were no vitals filed for this visit.   Subjective Assessment - 04/23/21 1326     Subjective Haven't had pudding or other solids; has tried water in the last two days.    Currently in Pain? Yes    Pain Score 3     Pain Location Mouth    Pain Descriptors / Indicators Sore;Burning    Pain Type Acute pain    Pain Onset 1 to 4 weeks ago    Pain Frequency Constant    Aggravating Factors  swallowing, regurgitation back into pharyx, clear throat or cough    Pain Relieving Factors meds                   ADULT SLP TREATMENT - 04/23/21 1343       Treatment Provided   Treatment provided Dysphagia      Dysphagia Treatment   Temperature Spikes Noted No    Respiratory Status Room air    Oral Cavity - Dentition Missing dentition    Treatment Methods Skilled observation;Therapeutic exercise;Compensation strategy training;Patient/caregiver  education    Patient observed directly with PO's Yes    Type of PO's observed Thin liquids    Oral Phase Signs & Symptoms --   slurping   Pharyngeal Phase Signs & Symptoms Immediate cough    Other treatment/comments Pt has been unable to complete HEP due to pain for last 3 weeks. SLP gave pt example of each HEP exercise and pt told SLP if he had done exercise correctly or incorrectly - pt 100% accurate. SLP then gave pt example of correct rep of exercise. SLP told pt to cycle through HEP instead of trying 10 reps of exercise number one then moving to exercise #2. With a sip of water pt had immediate semi-strong cough. with smaller (SLP suggested) sip "Half of the last size" pt had mild cough immediately. SLP then provided pt 1/3 teaspoon bolus and she did not cough x5 (pt gave herself last 3 boluses). She tried one 1/2-2/3  teaspoon size with immediate mild cough. SLP reiterated 1/3 teaspoon size and pt had one more with this size without cough. see pt instructions for more details.      Assessment / Recommendations / Plan   Plan Continue with current  plan of care      Dysphagia Recommendations   Diet recommendations Thin liquid   5-7 1/3 teaspooons, 10-15 times a day     Progression Toward Goals   Progression toward goals Progressing toward goals              SLP Education - 04/23/21 1716     Education Details rationale for HEP, SMALL SIPS (1/3 teaspoon sizes), see pt instructions    Person(s) Educated Patient;Spouse    Methods Explanation;Demonstration;Verbal cues;Handout    Comprehension Verbalized understanding;Returned demonstration;Verbal cues required;Need further instruction              SLP Short Term Goals - 04/23/21 1718       SLP SHORT TERM GOAL #1   Title pt will complete HEP with rare min A    Time 1    Period --   visit, for all STGs   Status Not Met   andcontinued     SLP SHORT TERM GOAL #2   Title pt will tell SLP why pt is completing HEP with modified  independence    Time 1    Status Partially Met      SLP SHORT TERM GOAL #3   Title pt will describe 3 overt s/s aspiration PNA with modified independence    Time 1    Status On-going      SLP SHORT TERM GOAL #4   Title pt will tell SLP how a food journal could hasten return to a more normalized diet    Time 2    Status On-going              SLP Long Term Goals - 04/23/21 1719       SLP LONG TERM GOAL #1   Title pt will complete HEP with modified independence over two visits    Time 3    Period --   visits, for all LTGs   Status On-going      SLP LONG TERM GOAL #2   Title pt will describe how to modify HEP over time, and the timeline associated with reduction in HEP frequency with modified independence over two sessions    Time 4    Status On-going              Plan - 04/23/21 1717     Clinical Impression Statement At this time pt exhibits at least cont'd oral stage dysphagia due mainly to surgical changes with mandible. Vanna's swallowing is deemed WNL/WFL with 1/3 teaspoon bolus sizes of water. SLP reviewed pt's individualized HEP for dysphagia as described in "skilled intervention".  Data indicate that pt's swallow ability will likely decrease over the course of radiation therapy and could very well decline over time following conclusion of their radiation therapy due to muscle disuse atrophy and/or muscle fibrosis. Pt will cont to need to be seen by SLP in order to assess safety of PO intake, assess the need for recommending any objective swallow assessment, and ensuring pt correctly completes the individualized HEP.    Speech Therapy Frequency --   once approx every 4 weeks   Duration --   90 days   Treatment/Interventions Aspiration precaution training;Pharyngeal strengthening exercises;Diet toleration management by SLP;Trials of upgraded texture/liquids;Internal/external aids;Patient/family education;Compensatory strategies;SLP instruction and feedback    Potential  to Achieve Goals Good    SLP Home Exercise Plan provided    Consulted and Agree with Plan of Care Patient  Patient will benefit from skilled therapeutic intervention in order to improve the following deficits and impairments:   Dysphagia, oral phase  Dysarthria and anarthria    Problem List Patient Active Problem List   Diagnosis Date Noted   Chest pain of uncertain etiology 89/16/9450   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 chemotherapy ([place]) 04/07/2021   Mucositis due to antineoplastic therapy 03/31/2021   Drug induced constipation 03/31/2021   Chemotherapy-induced nausea 03/31/2021   Port-A-Cath in place 03/09/2021   Carcinoma of lower gum ([facility]) 02/20/2021   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   Gastritis 12/08/2020   Anxiety 12/08/2020   Need for prophylactic vaccination and inoculation against influenza 07/30/2020   Depression 11/26/2015   GERD (gastroesophageal reflux disease) 11/26/2015   Migraine headache 11/26/2015   Screening for diabetes mellitus 09/03/2015   Hypothyroidism 07/22/2009   Mixed hyperlipidemia 07/22/2009   Tobacco user 07/22/2009   Hypertension, essential 07/22/2009   CAD, NATIVE VESSEL 07/22/2009   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   Coronary artery spasm ([place]) 07/22/2009    [place] ,[place], [place]  04/23/2021, 5:19 PM  [facility] [address] [place], [place], [address] Phone: [phone]   Fax:  [phone]   Name: KODEE DRURY MRN: [id number] Date of Birth: 1951/04/05

## 2021-04-23 NOTE — Patient Instructions (Signed)
  Do 5-7 swallows of 1/3 teaspoon water with an additional dry swallow, 10-15 times a day; you can do more times a day if you desire.  If you start to cough, stop and try again 10-15 minutes later  CYCLE THROUGH the exercises until you can't do any more

## 2021-04-25 ENCOUNTER — Encounter: Payer: Self-pay | Admitting: Cardiology

## 2021-04-25 DIAGNOSIS — I700 Atherosclerosis of aorta: Secondary | ICD-10-CM | POA: Insufficient documentation

## 2021-04-26 ENCOUNTER — Telehealth: Payer: Self-pay

## 2021-04-26 ENCOUNTER — Encounter: Payer: Medicare Other | Admitting: Rehabilitation

## 2021-04-26 MED ORDER — ASPIRIN EC 81 MG PO TBEC
81.0000 mg | DELAYED_RELEASE_TABLET | Freq: Every day | ORAL | 3 refills | Status: AC
Start: 2021-04-26 — End: ?

## 2021-04-26 NOTE — Telephone Encounter (Signed)
-----   Message from Sueanne Margarita, MD sent at 04/25/2021 10:10 PM EDT ----- Coronary CTA showed mild (25-49%) plaque in the RCA and minimal (<25%) plaque in the LAD with coronary Ca score 257.  There was also aortic atherosclerosis and emphysema on CT.

## 2021-04-26 NOTE — Telephone Encounter (Signed)
The patient has been notified of the result and verbalized understanding.  All questions (if any) were answered. Antonieta Iba, RN 04/26/2021 2:20 PM

## 2021-04-28 ENCOUNTER — Encounter: Payer: Medicare Other | Admitting: Physical Therapy

## 2021-04-29 ENCOUNTER — Inpatient Hospital Stay: Payer: Medicare Other

## 2021-04-29 ENCOUNTER — Other Ambulatory Visit: Payer: Medicare Other

## 2021-04-29 ENCOUNTER — Encounter: Payer: Self-pay | Admitting: Hematology and Oncology

## 2021-04-29 ENCOUNTER — Other Ambulatory Visit: Payer: Self-pay

## 2021-04-29 ENCOUNTER — Inpatient Hospital Stay: Payer: Medicare Other | Admitting: Hematology and Oncology

## 2021-04-29 DIAGNOSIS — E039 Hypothyroidism, unspecified: Secondary | ICD-10-CM | POA: Diagnosis not present

## 2021-04-29 DIAGNOSIS — C060 Malignant neoplasm of cheek mucosa: Secondary | ICD-10-CM | POA: Diagnosis not present

## 2021-04-29 DIAGNOSIS — D701 Agranulocytosis secondary to cancer chemotherapy: Secondary | ICD-10-CM

## 2021-04-29 DIAGNOSIS — K1231 Oral mucositis (ulcerative) due to antineoplastic therapy: Secondary | ICD-10-CM

## 2021-04-29 DIAGNOSIS — Z5111 Encounter for antineoplastic chemotherapy: Secondary | ICD-10-CM | POA: Diagnosis not present

## 2021-04-29 DIAGNOSIS — T451X5D Adverse effect of antineoplastic and immunosuppressive drugs, subsequent encounter: Secondary | ICD-10-CM

## 2021-04-29 DIAGNOSIS — Z87891 Personal history of nicotine dependence: Secondary | ICD-10-CM | POA: Diagnosis not present

## 2021-04-29 DIAGNOSIS — Z931 Gastrostomy status: Secondary | ICD-10-CM | POA: Diagnosis not present

## 2021-04-29 DIAGNOSIS — T451X5A Adverse effect of antineoplastic and immunosuppressive drugs, initial encounter: Secondary | ICD-10-CM

## 2021-04-29 LAB — CBC WITH DIFFERENTIAL (CANCER CENTER ONLY)
Abs Immature Granulocytes: 0.07 10*3/uL (ref 0.00–0.07)
Basophils Absolute: 0 10*3/uL (ref 0.0–0.1)
Basophils Relative: 0 %
Eosinophils Absolute: 0 10*3/uL (ref 0.0–0.5)
Eosinophils Relative: 0 %
HCT: 28.7 % — ABNORMAL LOW (ref 36.0–46.0)
Hemoglobin: 9.3 g/dL — ABNORMAL LOW (ref 12.0–15.0)
Immature Granulocytes: 1 %
Lymphocytes Relative: 3 %
Lymphs Abs: 0.3 10*3/uL — ABNORMAL LOW (ref 0.7–4.0)
MCH: 28 pg (ref 26.0–34.0)
MCHC: 32.4 g/dL (ref 30.0–36.0)
MCV: 86.4 fL (ref 80.0–100.0)
Monocytes Absolute: 1 10*3/uL (ref 0.1–1.0)
Monocytes Relative: 11 %
Neutro Abs: 7.9 10*3/uL — ABNORMAL HIGH (ref 1.7–7.7)
Neutrophils Relative %: 85 %
Platelet Count: 297 10*3/uL (ref 150–400)
RBC: 3.32 MIL/uL — ABNORMAL LOW (ref 3.87–5.11)
RDW: 17.6 % — ABNORMAL HIGH (ref 11.5–15.5)
WBC Count: 9.2 10*3/uL (ref 4.0–10.5)
nRBC: 0.3 % — ABNORMAL HIGH (ref 0.0–0.2)

## 2021-04-29 LAB — BASIC METABOLIC PANEL - CANCER CENTER ONLY
Anion gap: 12 (ref 5–15)
BUN: 23 mg/dL (ref 8–23)
CO2: 27 mmol/L (ref 22–32)
Calcium: 9.2 mg/dL (ref 8.9–10.3)
Chloride: 96 mmol/L — ABNORMAL LOW (ref 98–111)
Creatinine: 0.67 mg/dL (ref 0.44–1.00)
GFR, Estimated: >60 mL/min (ref >60)
Glucose, Bld: 139 mg/dL — ABNORMAL HIGH (ref 70–99)
Potassium: 4.2 mmol/L (ref 3.5–5.1)
Sodium: 135 mmol/L (ref 135–145)

## 2021-04-29 LAB — MAGNESIUM: Magnesium: 1.7 mg/dL (ref 1.7–2.4)

## 2021-04-29 MED ORDER — SODIUM CHLORIDE 0.9% FLUSH
10.0000 mL | INTRAVENOUS | Status: DC | PRN
  Administered 2021-04-29: 10 mL via INTRAVENOUS

## 2021-04-29 MED ORDER — HEPARIN SOD (PORK) LOCK FLUSH 100 UNIT/ML IV SOLN
500.0000 [IU] | Freq: Once | INTRAVENOUS | Status: AC
  Administered 2021-04-29: 500 [IU] via INTRAVENOUS

## 2021-04-29 NOTE — Patient Instructions (Signed)
Central Line, Adult A central line is a long, thin tube (catheter) that can be used to collect blood for testing or to give medicine through a vein. The tip of the central line ends in a large vein just above the heart (vena cava). A central line may be placed because: You need to get medicines or fluids through an IV for a long period of time. You need nutrition but cannot eat or absorb nutrients. The veins in your hands or arms are difficult to use for IV access. You need a blood transfusion. You need chemotherapy or dialysis. Types of central lines There are four main types of central lines: Peripherally inserted central catheter (PICC) line. This type is used for access of one week or longer. It can be used to draw blood and give fluids or medicines. A PICC looks like an IV tube, but it goes up the arm to the heart. It is usually inserted in the upper arm and taped in place on the arm. Tunneled central line. This type is used for long-term therapy and dialysis. It is placed in a large vein in the neck, chest, or groin. It is inserted through a small incision made over the vein, and then it is advanced to the heart. It is tunneled under the skin and brought out through a second incision. Non-tunneled central line. This type is used for short-term access, usually for a maximum of 7 days. It is often used in the emergency department. It is inserted in the neck, chest, or groin. Implanted port. This type is used for long-term therapy. It can stay in place longer than other types of central lines. It is normally inserted in the upper chest, but it can also be placed in the upper arm or the abdomen. It is inserted and removed with surgery, and it is accessed using a needle. The type of central line that you receive depends on how long you will need it, your medical condition, and the condition of your veins. Tell a health care provider about: Any allergies you have. All medicines you are taking,  including vitamins, herbs, eye drops, creams, and over-the-counter medicines. Any problems you or family members have had with anesthetic medicines. Any blood disorders you have. Any surgeries you have had. Any medical conditions you have. Whether you are pregnant or may be pregnant. What are the risks? Generally, placement and use of a central line is safe. However, problems may occur, including: Infection. A blood clot that blocks the central line or forms in the vein and travels to the heart. Bleeding from the place where the central line was inserted. Developing a hole or crack within the central line. If this happens, the central line will need to be replaced. Central line failure. The catheter moving or coming out of place. What happens before the procedure? Medicines Ask your health care provider about: Changing or stopping your regular medicines. This is especially important if you are taking diabetes medicines or blood thinners. Taking medicines such as aspirin and ibuprofen. These medicines can thin your blood. Do not take these medicines unless your health care provider tells you to take them. Taking over-the-counter medicines, vitamins, herbs, and supplements. General instructions Follow instructions from your health care provider about eating or drinking restrictions. Ask your health care provider: How your procedure site will be marked. What steps will be taken to help prevent infection. These steps may include: Removing hair at the procedure site. Washing skin with a germ-killing soap.   Plan to have a responsible adult take you home from the hospital or clinic. If you will be going home right after the procedure, plan to have a responsible adult care for you for the time you are told. This is important. What happens during the procedure? The procedure will vary depending on the type of central line being placed. In general: An IV will be inserted into one of your  veins. You will be given one or more of the following: A medicine to help you relax (sedative). A medicine to numb the area (local anesthetic). Your skin will be cleaned with a germ-killing (antiseptic) solution, and you may be covered with sterile drapes. Your blood pressure, heart rate, breathing rate, and blood oxygen level will be monitored during the procedure. The central line catheter will be inserted into the vein and advanced to the correct spot. The health care provider may use X-ray equipment to help guide the catheter to the right place. A bandage (dressing) will be placed over the insertion area. The procedure may vary among health care providers and hospitals. What can I expect after the procedure? Your blood pressure, heart rate, breathing rate, and blood oxygen level will be monitored until you leave the hospital or clinic. Antiseptic caps may be placed on the ends of the central line tubing. If you were given a sedative during the procedure, it can affect you for several hours. Do not drive or operate machinery until your health care provider says that it is safe. Follow these instructions at home: Flushing and cleaning the central line  Follow instructions from your health care provider about flushing and cleaning the central line and the area around it. Only use sterile supplies to flush the central line. Use supplies from your health care provider, a pharmacy, or another source that is recommended by your health care provider. Before you flush the central line or clean the central line or the area around it: Wash your hands with soap and water for at least 20 seconds. If soap and water are not available, use alcohol-based hand sanitizer. Clean the central line hub with rubbing alcohol. Unless directed otherwise by the manufacturer's instructions, scrub using a twisting motion and rub for 10 to 15 seconds or for 30 twists. Be sure you scrub the top of the hub, not just the  sides. Never reuse alcohol pads. Let the hub dry before use. Prevent it from touching anything while drying. Caring for the incision or central line site Check your incision or central line site every day for signs of infection. Check for: Redness, swelling, or pain. Fluid or blood. Warmth. Pus or a bad smell. Keep the insertion site of your central line clean and dry at all times. Change your dressing only as told by your health care provider. Keep your dressing dry. If it gets wet, have it changed as soon as possible. General instructions Follow instructions from your health care provider for the type of device that you have. Keep the tube clamped, unless it is being used. If the central line accidentally gets pulled on, make sure: The dressing is okay. There is no bleeding. The line has not been pulled out. Do not use scissors or sharp objects near the tube. Do not take baths, swim, or use a hot tub until your health care provider approves. Ask your health care provider if you may take showers. You may only be allowed to take sponge baths. Ask your health care provider what activities are   safe for you. You may be restricted from lifting or making repetitive arm movements on the side of your central line. Take over-the-counter and prescription medicines only as told by your health care provider. Keep all follow-up visits. This is important. Storage and disposal of supplies Keep your supplies in a clean, dry location. Throw away any used syringes in a disposal container that is meant for sharp items (sharps container). You can buy a sharps container from a pharmacy, or you can make one by using an empty hard plastic bottle with a cover. Place any used dressings or infusion bags into a plastic bag. Throw that bag in the trash. Contact a health care provider if: You have redness, swelling, or pain around your insertion site. You have fluid or blood coming from your insertion site. Your  insertion site feels warm to the touch. You have pus or a bad smell coming from your insertion site. Get help right away if: You have: A fever or chills. Shortness of breath. Chest pain or a racing heartbeat. Swelling in your neck, face, chest, or arm on the side of your central line. You feel dizzy or you faint. Your incision or central line site has red streaks spreading away from the area. Your incision or central line site is bleeding and does not stop. Your central line is difficult to flush or will not flush. You do not get a blood return from the central line. Your central line gets loose or damaged or comes out. Your catheter leaks when flushed or when fluids are infused into it. Summary A central line is a long, thin tube (catheter) that can be used to give medicine through a vein. Follow specific instructions from your health care provider for the type of device that you have. Keep the insertion site of your central line clean and dry at all times. Keep the tube clamped, unless it is being used. This information is not intended to replace advice given to you by your health care provider. Make sure you discuss any questions you have with your health care provider. Document Revised: 02/27/2020 Document Reviewed: 02/27/2020 Elsevier Patient Education  2022 [employer].

## 2021-04-29 NOTE — Assessment & Plan Note (Signed)
Moderate to severe pain Encouraged to continue morphine as prescribed.

## 2021-04-29 NOTE — Assessment & Plan Note (Signed)
Leukopenia improved. WBC normal today.

## 2021-04-29 NOTE — Assessment & Plan Note (Signed)
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  Her pathologic staging is pT3 N3B. Given positive margins and presence of extracapsular extension, we agreed with adjuvant chemoradiation. She completed 6 weekly cycles of cisplatin concurrent with radiation. She is here for follow-up.  Review of systems pertinent for ongoing fatigue, mucositis Physical examination,she appears tired, erythema of oral mucosa, skin changes over the neck from recent radiation. Port site appears well, slight erythema around the port with some discharge, doesn't look purulent.   I have reviewed the labs today, change in WBC noted, no overt source of infection, Will continue to monitor. BMP and Mg pending. Will omit C7 since she completed radiation more than a week ago RTC in 2 weeks for FU.

## 2021-04-29 NOTE — Assessment & Plan Note (Signed)
Weight has been stable since last visit Can sip some water. Will continue to monitor She can try clear liquids in a week or two hopefully once the soreness improves.

## 2021-04-29 NOTE — Progress Notes (Signed)
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  Her pathologic staging is pT3 N3B. Given positive margins and presence of extracapsular extension, we agreed with adjuvant chemoradiation. She completed 6 weekly cycles of cisplatin concurrent with radiation. She is here for follow-up.  Review of systems pertinent for ongoing fatigue, mucositis Physical examination,she appears tired, erythema of oral mucosa, skin changes over the neck from recent radiation. Port site appears well, slight erythema around the port with some discharge, doesn't look purulent.   I have reviewed the labs today, change in WBC noted, no overt source of infection, Will continue to monitor. BMP and Mg pending. Will omit C7 since she completed radiation more than a week ago RTC in 2 weeks for FU.  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 chemotherapy (HCC) Leukopenia improved. WBC normal today.   Mucositis due to antineoplastic therapy Moderate to severe pain Encouraged to continue morphine as prescribed.   G tube feedings ([place]) Weight has been stable since last visit Can sip some water. Will continue to monitor She can try clear liquids  in a week or two hopefully once the soreness improves.   HISTORY OF PRESENTING ILLNESS:   Tammy Brady 70 y.o. female is here because of  SCC oral mucosa  This is a pleasant 70 year old female patient who first presented to her dentist with a painful lesion to her gums noticed more like at the end of 2021.  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  Subsequently she was referred to Dr. Amada Jupiter on December 14, 2020 who discussed surgical intervention with patient.  She had neck CT which showed an enhancing soft tissue lesion along the buccal margin of left mandible with a lucency in the adjacent bone consistent with known SCC.  She was also found to have necrotic left level 1A/PN2/3B lymph nodes noted to be concerning for nodal metastasis.  She underwent resection on January 18, 2021 which revealed 3.3 cm tumor, grade 2 squamous cell carcinoma of the alveolar ridge extending to buccal gingival junction and lingual sulcus, depth of invasion 11 mm, negative margins, 6 out of 49 lymph nodes positive bilaterally with extracapsular extension, no perineural invasion or lymphovascular invasion.  She is now referred to medical oncology for consideration of adjuvant chemoradiation.  PATHOLOGY REPORT: Diagnosis     A: Mouth, RMT mucosal margin, excision - Benign squamous mucosa and submucosa with no tumor seen.   B: Mouth, left buccal margin, excision - Benign squamous mucosa and submucosa with no tumor seen.   C: Mouth, labial mucosal margin, excision - Benign squamous mucosa and submucosa with no tumor seen.   D: Mouth, anterior gingival mucosal margin, excision - Benign squamous mucosa and submucosa with no tumor seen.   E:  Mouth, floor of mouth, biopsy - Benign squamous mucosa and submucosa with no tumor seen.   F: Tongue, ventral tongue, biopsy - Benign squamous mucosa and submucosa with no tumor seen.   G: Bone marrow, anterior mandible,  biopsy - Fragment of blood clot, no tumor seen, on original frozen section only, tissue has been cut through on permanent sections.   H: Bone marrow, posterior mandible, biopsy - Fragment of blood clot, no tumor seen, on original frozen section only, tissue has been cut through on permanent sections.   I: Lymph node, left neck level 1B and level 1A, lymphadenectomy - Metastatic squamous cell carcinoma with cystic change, involving 2 of 4 lymph nodes (2/4), size of largest metastasis 2.2 cm. - Benign submandibular gland is also present.   J: Lymph node, right level 1B, lymphadenectomy - Metastatic squamous cell carcinoma involving 2 of 4 lymph nodes (2/4), with cystic change, size of largest metastasis 2.0 cm in greatest dimension, with extranodal extension of tumor identified. - Submandibular gland present with no tumor seen.   K: Lymph node, left EJ, excision - No tumor seen in three lymph nodes (0/3).   L: Lymph node, left neck, level 2, lymphadenectomy - No tumor seen in 14 lymph nodes (0/14).   M: Lymph node, left neck, level 3, lymphadenectomy - Metastatic squamous cell carcinoma involving 1 of 7 lymph nodes (1/7), size of metastasis 2.2 cm, with extranodal extension of tumor identified.   N: Lymph node, left neck, level 4, lymphadenectomy - Metastatic squamous cell carcinoma involving 1 of 2 lymph nodes (1/2), size of metastasis 1 mm.   O: Lymph node, right neck, level 2, lymphadenectomy - No tumor seen in eight lymph nodes (0/8).   P: Lymph node, right neck, level 3, lymphadenectomy - No tumor seen in four lymph nodes (0/4).   Q: Lymph node, right neck, level 4, lymphadenectomy - No tumor seen in three lymph nodes (0/3).   R: Left composite mandibular resection (segmental mandibulectomy and resection of submental gland) - Squamous cell carcinoma, moderately differentiated, of alveolar ridge extending to buccal gingival junction and lingual sulcus. - Tumor size 3.3 cm in  greatest lateral dimension, depth of invasion 1.1 cm. - Tumor involves the alveolar bone and does not exceed the depth of the roots of the teeth, and does not extend into the body of the mandible.  - Surgical margins free of tumor (soft tissue and bone margins all negative for tumor) - Submental gland present with no tumor seen.   S: Teeth, extraction - Multiple intact and fragmented teeth (gross examination only).    Given positive margin and extracapsular extension, we have discussed about adjuvant chemoradiation with weekly cisplatin.  Started weekly cisplatin on 03/10/2021. Cycle 2-day 1 on 03/17/2021 Cycle 3-day 1 on 03/24/2021. Cycle 4 day 1 on 03/31/2021 Cycle 5 day 1 on 04/07/2021 Cycle 6 day 1 on 04/21/2021  Interval history  Patient is here for follow-up after cycle 6 of cisplatin. Since last visit, she is tired, sleeping more. Otherwise she denies any nausea or vomiting, Feeding is exclusively through G tube. No change in breathing Tinnitus continues, no hearing changes. No change in bowel habits Urinating well.  Rest of the pertinent 10 point ROS reviewed and negative.   MEDICAL HISTORY:  Past Medical History:  Diagnosis Date   Aortic atherosclerosis ([place])    CAD (coronary artery disease)    Mild non-obstructive disease by cath January 2011. Coronary CTA showed  mild (25-49%) plaque in the  RCA and minimal (<25%) coronary CTA 04/2021 with coronary Ca score 257   Carotid artery disease (HCC)    Followed by Dr Kellie Simmering   GERD (gastroesophageal reflux disease)    Hyperlipidemia    Hypertension    Hypothyroidism     SURGICAL HISTORY: Past Surgical History:  Procedure Laterality Date   APPENDECTOMY     IR IMAGING GUIDED PORT INSERTION  03/02/2021    SOCIAL HISTORY: Social History   Socioeconomic History   Marital status: Married    Spouse name: Not on file   Number of children: Not on file   Years of education: Not on file   Highest education level: Not on  file  Occupational History   Occupation: Field seismologist: [employer]  Tobacco Use   Smoking status: Former    Packs/day: 1.00    Years: 40.00    Pack years: 40.00    Types: Cigarettes    Quit date: 05/11/2009    Years since quitting: 11.9   Smokeless tobacco: Never  Substance and Sexual Activity   Alcohol use: Not Currently   Drug use: No   Sexual activity: Not Currently  Other Topics Concern   Not on file  Social History Narrative   Not on file   Social Determinants of Health   Financial Resource Strain: Low Risk    Difficulty of Paying Living Expenses: Not hard at all  Food Insecurity: No Food Insecurity   Worried About Charity fundraiser in the Last Year: Never true   [place] in the Last Year: Never true  Transportation Needs: No Transportation Needs   Lack of Transportation (Medical): No   Lack of Transportation (Non-Medical): No  Physical Activity: Not on file  Stress: No Stress Concern Present   Feeling of Stress : Only a little  Social Connections: Engineer, building services of Communication with Friends and Family: More than three times a week   Frequency of Social Gatherings with Friends and Family: More than three times a week   Attends Religious Services: More than 4 times per year   Active Member of [employer] or Organizations: Yes   Attends Music therapist: More than 4 times per year   Marital Status: Married  Human resources officer Violence: Not on file    FAMILY HISTORY: Family History  Problem Relation Age of Onset   Cancer Mother    Coronary artery disease Father    Coronary artery disease Maternal Grandmother     ALLERGIES:  is allergic to codeine.  MEDICATIONS:  Current Outpatient Medications  Medication Sig Dispense Refill   acetaminophen (TYLENOL) 500 MG tablet Take 500 mg by mouth every 6 (six) hours as needed.     aspirin EC 81 MG tablet Take 1 tablet (81 mg total) by mouth daily. Swallow whole. 90 tablet 3    calcium carbonate (OS-CAL) 600 MG TABS Take 600 mg by mouth daily.     dexamethasone (DECADRON) 4 MG tablet Take 2 tablets (8 mg total) by mouth daily. Take daily x 3 days starting the day after cisplatin chemotherapy. Take with food. 30 tablet 1   diltiazem (CARDIZEM LA) 360 MG 24 hr tablet Take 1 tablet (360 mg total) by mouth daily. 30 tablet 3   gabapentin (NEURONTIN) 300 MG capsule Take 1 capsule by mouth 3 (three) times daily.     levothyroxine (SYNTHROID) 112 MCG tablet TAKE 1 TABLET BY MOUTH  DAILY 90 tablet 0  lidocaine (XYLOCAINE) 2 % solution Patient: Mix 1part 2% viscous lidocaine, 1part H20. Swish & swallow 28mL of diluted mixture, 72min before meals and at bedtime, up to QID 200 mL 4   lidocaine-prilocaine (EMLA) cream Apply to affected area once 30 g 3   Morphine Sulfate (MORPHINE CONCENTRATE) 10 mg / 0.5 ml concentrated solution Take 0.25-0.5 mLs (5-10 mg total) by mouth every 6 (six) hours as needed for severe pain. 30 mL 0   Multiple Vitamin (MULTIVITAMIN) tablet Take 1 tablet by mouth daily.     nitroGLYCERIN (NITROSTAT) 0.4 MG SL tablet Place 1 tablet (0.4 mg total) under the tongue every 5 (five) minutes as needed. 25 tablet 6   Nutritional Supplements (FEEDING SUPPLEMENT, OSMOLITE 1.5 CAL,) LIQD Place into feeding tube daily. 4-5 cartons daily     omeprazole (PRILOSEC) 20 MG capsule Take 1 capsule (20 mg total) by mouth daily. 30 capsule 1   ondansetron (ZOFRAN) 8 MG tablet Take 1 tablet (8 mg total) by mouth 2 (two) times daily as needed. Start on the third day after cisplatin chemotherapy. 30 tablet 1   Polyethylene Glycol 3350 (MIRALAX PO) Take by mouth as needed.     prochlorperazine (COMPAZINE) 10 MG tablet Take 1 tablet (10 mg total) by mouth every 6 (six) hours as needed (Nausea or vomiting). 30 tablet 1   simvastatin (ZOCOR) 40 MG tablet TAKE 1 TABLET BY MOUTH AT  BEDTIME 90 tablet 0   traZODone (DESYREL) 50 MG tablet Take 1 tablet (50 mg total) by mouth at bedtime. 90  tablet 1   No current facility-administered medications for this visit.   Facility-Administered Medications Ordered in Other Visits  Medication Dose Route Frequency Provider Last Rate Last Admin   sodium chloride flush (NS) 0.9 % injection 10 mL  10 mL Intravenous PRN Elton Catalano, Arletha Pili, MD   10 mL at 04/29/21 0918    PHYSICAL EXAMINATION:  ECOG PERFORMANCE STATUS: 0 - Asymptomatic  Vitals:   04/29/21 0852  BP: (!) 131/59  Pulse: 80  Resp: 18  Temp: 98.7 F (37.1 C)  SpO2: 96%    Physical Exam Constitutional:      Appearance: Normal appearance.  HENT:     Head: Normocephalic and atraumatic.  Cardiovascular:     Rate and Rhythm: Normal rate and regular rhythm.     Pulses: Normal pulses.     Heart sounds: Normal heart sounds.  Pulmonary:     Effort: Pulmonary effort is normal.     Breath sounds: Normal breath sounds.  Abdominal:     General: Abdomen is flat.     Palpations: Abdomen is soft.  Musculoskeletal:        General: Swelling (LLE greater than right, this has been a chronic change according to patient) present. No tenderness.     Cervical back: Normal range of motion and neck supple. No rigidity.  Lymphadenopathy:     Cervical: No cervical adenopathy.  Skin:    General: Skin is warm and dry.     Findings: Erythema and rash present.  Neurological:     General: No focal deficit present.     Mental Status: She is alert.  Psychiatric:        Mood and Affect: Mood normal.     LABORATORY DATA:  I have reviewed the data as listed Lab Results  Component Value Date   WBC 9.2 04/29/2021   HGB 9.3 (L) 04/29/2021   HCT 28.7 (L) 04/29/2021   MCV 86.4 04/29/2021  PLT 297 04/29/2021     Chemistry      Component Value Date/Time   NA 135 04/29/2021 0837   NA 134 04/20/2021 1356   K 4.2 04/29/2021 0837   CL 96 (L) 04/29/2021 0837   CO2 27 04/29/2021 0837   BUN 23 04/29/2021 0837   BUN 21 04/20/2021 1356   CREATININE 0.67 04/29/2021 0837      Component  Value Date/Time   CALCIUM 9.2 04/29/2021 0837   ALKPHOS 89 04/20/2021 1356   AST 47 (H) 04/20/2021 1356   ALT 21 04/20/2021 1356   BILITOT <0.2 04/20/2021 1356     Have reviewed pertinent labs, CBC satisfactory CMP and mag pending at the time of my visit.  RADIOGRAPHIC STUDIES: I have personally reviewed the radiological images as listed and agreed with the findings in the report. CT CORONARY MORPH W/CTA COR W/SCORE W/CA W/CM &/OR WO/CM  Addendum Date: 04/22/2021   ADDENDUM REPORT: 04/22/2021 15:15 CLINICAL DATA:  54F with CAD, hypertension, hyperlipidemia, carotid stenosis and back pain. EXAM: Cardiac/Coronary  CT TECHNIQUE: The patient was scanned on a [employer]. FINDINGS: A 120 kV prospective scan was triggered in the descending thoracic aorta at 111 HU's. Axial non-contrast 3 mm slices were carried out through the heart. The data set was analyzed on a dedicated work station and scored using the [place]. Gantry rotation speed was 250 msecs and collimation was .6 mm. No beta blockade and 0.8 mg of sl NTG was given. The 3D data set was reconstructed in 5% intervals of the 67-82 % of the R-R cycle. Diastolic phases were analyzed on a dedicated work station using MPR, MIP and VRT modes. The patient received 80 cc of contrast. Aorta: Normal size. Ascending aorta 3.1 cm. Diffuse aortic atherosclerosis. No dissection. Aortic Valve:  Trileaflet.  No calcifications. Coronary Arteries:  Normal coronary origin.  Right dominance. RCA is a large dominant artery that gives rise to PDA and PLVB. There is mild (25-49%) calcified plaque in the mid RCA and minimal (<25%) calcified plaque in the distal RCA. Left main is a large artery that gives rise to LAD, RI, and LCX arteries. LAD is a large vessel that has minimal (<25%) calcified plaque in the proximal and mid LAD. LCX is a non-dominant artery that gives rise to one large OM1 branch. There is no plaque. RI is a small vessel without plaque.  Coronary Calcium Score: Left main: 0 Left anterior descending artery: 53.8 Left circumflex artery: 0 Right coronary artery: 203 Total: 257 Percentile: 87th Other findings: Normal pulmonary vein drainage into the left atrium. There are three pulmonary veins on the right and two on the left. Normal let atrial appendage without a thrombus. Normal size of the pulmonary artery. IMPRESSION: 1. Coronary calcium score of 257. This was 87th percentile for age-, race-, and sex-matched controls. 2. Normal coronary origin with right dominance. 3. There is mild (25-49%) plaque in the RCA and minimal (<25%) plaque in the LAD. 4. Recommend aggressive risk factor modification, including LDL goal <70. Skeet Latch, MD Electronically Signed   By: Skeet Latch M.D.   On: 04/22/2021 15:15   Result Date: 04/22/2021 EXAM: OVER-READ INTERPRETATION  CT CHEST The following report is an over-read performed by radiologist Dr. Vinnie Langton of [facility], [place] on 04/22/2021. This over-read does not include interpretation of cardiac or coronary anatomy or pathology. The coronary calcium score/coronary CTA interpretation by the cardiologist is attached. COMPARISON:  None. FINDINGS: Central venous catheter terminating  at the superior cavoatrial junction. Atherosclerotic calcifications in the thoracic aorta. Mild emphysematous changes are noted throughout the visualize lungs. Within the visualized portions of the thorax there are no suspicious appearing pulmonary nodules or masses, there is no acute consolidative airspace disease, no pleural effusions, no pneumothorax and no lymphadenopathy. Visualized portions of the upper abdomen are unremarkable. There are no aggressive appearing lytic or blastic lesions noted in the visualized portions of the skeleton. IMPRESSION: 1.  Aortic Atherosclerosis (ICD10-I70.0). 2.  Emphysema (ICD10-J43.9). Electronically Signed: By: Vinnie Langton M.D. On: 04/22/2021 14:02    All questions  were answered. The patient knows to call the clinic with any problems, questions or concerns. I spent 30 minutes in the care of this patient including H and P, review of records, counseling and coordination of care.    Benay Pike, MD 04/29/2021 9:33 AM

## 2021-04-29 NOTE — Patient Instructions (Signed)

## 2021-05-03 ENCOUNTER — Ambulatory Visit: Payer: Medicare Other

## 2021-05-03 ENCOUNTER — Inpatient Hospital Stay (HOSPITAL_BASED_OUTPATIENT_CLINIC_OR_DEPARTMENT_OTHER): Payer: Medicare Other | Admitting: Physician Assistant

## 2021-05-03 ENCOUNTER — Other Ambulatory Visit: Payer: Self-pay | Admitting: *Deleted

## 2021-05-03 ENCOUNTER — Other Ambulatory Visit: Payer: Self-pay

## 2021-05-03 ENCOUNTER — Encounter: Payer: Medicare Other | Admitting: Physical Therapy

## 2021-05-03 ENCOUNTER — Inpatient Hospital Stay: Payer: Medicare Other

## 2021-05-03 ENCOUNTER — Ambulatory Visit (HOSPITAL_COMMUNITY)
Admission: RE | Admit: 2021-05-03 | Discharge: 2021-05-03 | Disposition: A | Discharge status: Home / Self Care | Payer: Medicare Other | Source: Ambulatory Visit | Attending: Physician Assistant | Admitting: Physician Assistant

## 2021-05-03 VITALS — BP 127/63 | HR 93 | Temp 97.7°F | Resp 18

## 2021-05-03 DIAGNOSIS — J984 Other disorders of lung: Secondary | ICD-10-CM | POA: Diagnosis not present

## 2021-05-03 DIAGNOSIS — J0100 Acute maxillary sinusitis, unspecified: Secondary | ICD-10-CM | POA: Diagnosis not present

## 2021-05-03 DIAGNOSIS — W19XXXA Unspecified fall, initial encounter: Secondary | ICD-10-CM | POA: Diagnosis not present

## 2021-05-03 DIAGNOSIS — Z043 Encounter for examination and observation following other accident: Secondary | ICD-10-CM | POA: Diagnosis not present

## 2021-05-03 DIAGNOSIS — S299XXA Unspecified injury of thorax, initial encounter: Secondary | ICD-10-CM | POA: Diagnosis present

## 2021-05-03 DIAGNOSIS — B370 Candidal stomatitis: Secondary | ICD-10-CM | POA: Diagnosis not present

## 2021-05-03 DIAGNOSIS — R079 Chest pain, unspecified: Secondary | ICD-10-CM | POA: Insufficient documentation

## 2021-05-03 DIAGNOSIS — C060 Malignant neoplasm of cheek mucosa: Secondary | ICD-10-CM | POA: Diagnosis not present

## 2021-05-03 LAB — URINALYSIS, COMPLETE (UACMP) WITH MICROSCOPIC
Bilirubin Urine: NEGATIVE
Glucose, UA: NEGATIVE mg/dL
Hgb urine dipstick: NEGATIVE
Ketones, ur: NEGATIVE mg/dL
Leukocytes,Ua: NEGATIVE
Nitrite: NEGATIVE
Protein, ur: 30 mg/dL — AB
Specific Gravity, Urine: 1.014 (ref 1.005–1.030)
pH: 7 (ref 5.0–8.0)

## 2021-05-03 LAB — CMP (CANCER CENTER ONLY)
ALT: 15 U/L (ref 0–44)
AST: 20 U/L (ref 15–41)
Albumin: 2.7 g/dL — ABNORMAL LOW (ref 3.5–5.0)
Alkaline Phosphatase: 68 U/L (ref 38–126)
Anion gap: 12 (ref 5–15)
BUN: 21 mg/dL (ref 8–23)
CO2: 28 mmol/L (ref 22–32)
Calcium: 9 mg/dL (ref 8.9–10.3)
Chloride: 94 mmol/L — ABNORMAL LOW (ref 98–111)
Creatinine: 0.59 mg/dL (ref 0.44–1.00)
GFR, Estimated: >60 mL/min (ref >60)
Glucose, Bld: 107 mg/dL — ABNORMAL HIGH (ref 70–99)
Potassium: 3.9 mmol/L (ref 3.5–5.1)
Sodium: 134 mmol/L — ABNORMAL LOW (ref 135–145)
Total Bilirubin: 0.2 mg/dL — ABNORMAL LOW (ref 0.3–1.2)
Total Protein: 7.2 g/dL (ref 6.5–8.1)

## 2021-05-03 LAB — CBC WITH DIFFERENTIAL (CANCER CENTER ONLY)
Abs Immature Granulocytes: 0.03 10*3/uL (ref 0.00–0.07)
Basophils Absolute: 0 10*3/uL (ref 0.0–0.1)
Basophils Relative: 0 %
Eosinophils Absolute: 0 10*3/uL (ref 0.0–0.5)
Eosinophils Relative: 0 %
HCT: 26.7 % — ABNORMAL LOW (ref 36.0–46.0)
Hemoglobin: 8.5 g/dL — ABNORMAL LOW (ref 12.0–15.0)
Immature Granulocytes: 0 %
Lymphocytes Relative: 4 %
Lymphs Abs: 0.3 10*3/uL — ABNORMAL LOW (ref 0.7–4.0)
MCH: 28.2 pg (ref 26.0–34.0)
MCHC: 31.8 g/dL (ref 30.0–36.0)
MCV: 88.7 fL (ref 80.0–100.0)
Monocytes Absolute: 0.9 10*3/uL (ref 0.1–1.0)
Monocytes Relative: 12 %
Neutro Abs: 6 10*3/uL (ref 1.7–7.7)
Neutrophils Relative %: 84 %
Platelet Count: 213 10*3/uL (ref 150–400)
RBC: 3.01 MIL/uL — ABNORMAL LOW (ref 3.87–5.11)
RDW: 18.3 % — ABNORMAL HIGH (ref 11.5–15.5)
WBC Count: 7.2 10*3/uL (ref 4.0–10.5)
nRBC: 0 % (ref 0.0–0.2)

## 2021-05-03 LAB — MAGNESIUM: Magnesium: 2 mg/dL (ref 1.7–2.4)

## 2021-05-03 MED ORDER — AMOXICILLIN-POT CLAVULANATE 250-125 MG PO TABS: 1.0000 | ORAL_TABLET | Freq: Three times a day (TID) | ORAL | 0 refills | Status: AC

## 2021-05-03 MED ORDER — NYSTATIN 100000 UNIT/ML MT SUSP: 5.0000 mL | Freq: Four times a day (QID) | OROMUCOSAL | 0 refills | Status: AC

## 2021-05-03 NOTE — Progress Notes (Signed)
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 Most recent appointment with oncologist Dr. Chryl Heck 04/29/21.  Interval history- Veona A. Covington is a 70 yo female with above mentioned history presenting to [facility] clinic today with chief complaint of fever x 1 day, increased sinus pressure and left ear pain x 5 days. Took covid home test today that was negative, had covid vaccine and no known sick contacts.  Patient describes the pain as a throbbing.  She states pain is worse when leaning forward and she feels like she has chest pressure as well as fluid in her ears.  No drainage from the ears.  No rash.  Taking Tylenol, last dose was at 7 AM this morning.  She admits to having chills and yesterday was the first day of her fever.  She reports axillary temp of 101 yesterday.  Patient feels that chills improved after Tylenol.  She reports increased yellow sputum production that is worsening over the last  week.  She is still tolerating feeds and fluids in her G tube and does not feel as if she is dehydrated.  Patient with mechanical fall x2 days ago and landed on her left side.  She denies hitting her head or any loss of consciousness.  Patient takes daily aspirin, otherwise no anticoagulation.  She states since the fall she has had pain in her left ribs and it hurts to take a deep breath although she is able to.  She has been able to ambulate without difficulty.    REVIEW OF SYSTEMS- Review of Systems  Constitutional:  Positive for chills and fever.  HENT:  Positive for ear pain and sinus pain. Negative for congestion, ear discharge, hearing loss, nosebleeds, sore throat and tinnitus.   Eyes:  Negative for pain.  Respiratory:  Positive for sputum production. Negative for cough, hemoptysis, shortness of breath and wheezing.   Cardiovascular:  Positive for chest pain (left chest wall pain). Negative for leg swelling.  Gastrointestinal:  Negative for nausea and vomiting.  Genitourinary:  Negative for dysuria, flank pain, frequency and hematuria.  Musculoskeletal:  Positive for falls. Negative for back pain, joint pain and myalgias.  Skin:  Negative for rash.  Neurological:  Positive for headaches. Negative for dizziness, focal weakness and weakness.  Endo/Heme/Allergies:  Does not bruise/bleed easily.  Psychiatric/Behavioral:  Negative for substance abuse. The patient is not nervous/anxious.       Allergies  Allergen Reactions   Codeine Other (See  Comments)    Headache  HEADACHE     Past Medical History:  Diagnosis Date   Aortic atherosclerosis (HCC)    CAD (coronary artery disease)    Mild non-obstructive disease by cath January 2011. Coronary CTA showed  mild (25-49%) plaque in the RCA and minimal (<25%) coronary CTA 04/2021 with coronary Ca score 257   Carotid artery disease (HCC)    Followed by Dr Kellie Simmering   GERD (gastroesophageal reflux disease)    Hyperlipidemia     Hypertension    Hypothyroidism      Past Surgical History:  Procedure Laterality Date   APPENDECTOMY     IR IMAGING GUIDED PORT INSERTION  03/02/2021    Social History   Socioeconomic History   Marital status: Married    Spouse name: Not on file   Number of children: Not on file   Years of education: Not on file   Highest education level: Not on file  Occupational History   Occupation: Field seismologist: [employer]  Tobacco Use   Smoking status: Former    Packs/day: 1.00    Years: 40.00    Pack years: 40.00    Types: Cigarettes    Quit date: 05/11/2009    Years since quitting: 11.9   Smokeless tobacco: Never  Substance and Sexual Activity   Alcohol use: Not Currently   Drug use: No   Sexual activity: Not Currently  Other Topics Concern   Not on file  Social History Narrative   Not on file   Social Determinants of Health   Financial Resource Strain: Low Risk    Difficulty of Paying Living Expenses: Not hard at all  Food Insecurity: No Food Insecurity   Worried About Charity fundraiser in the Last Year: Never true   [place] in the Last Year: Never true  Transportation Needs: No Transportation Needs   Lack of Transportation (Medical): No   Lack of Transportation (Non-Medical): No  Physical Activity: Not on file  Stress: No Stress Concern Present   Feeling of Stress : Only a little  Social Connections: Engineer, building services of Communication with Friends and Family: More than three times a week   Frequency of Social Gatherings with Friends and Family: More than three times a week   Attends Religious Services: More than 4 times per year   Active Member of [employer] or Organizations: Yes   Attends Music therapist: More than 4 times per year   Marital Status: Married  Human resources officer Violence: Not on file    Family History  Problem Relation Age of Onset   Cancer Mother    Coronary artery disease Father    Coronary artery  disease Maternal Grandmother      Current Outpatient Medications:    amoxicillin-clavulanate (AUGMENTIN) 250-125 MG tablet, Take 1 tablet by mouth 3 (three) times daily for 7 days., Disp: 21 tablet, Rfl: 0   nystatin (MYCOSTATIN) 100000 UNIT/ML suspension, Take 5 mLs (500,000 Units total) by mouth 4 (four) times daily for 7 days., Disp: 60 mL, Rfl: 0   acetaminophen (TYLENOL) 500 MG tablet, Take 500 mg by mouth every 6 (six) hours as needed., Disp: , Rfl:    aspirin EC 81 MG tablet, Take 1 tablet (81 mg total) by mouth daily. Swallow whole., Disp: 90 tablet, Rfl: 3   calcium carbonate (OS-CAL) 600 MG TABS, Take 600 mg by mouth daily., Disp: , Rfl:    dexamethasone (  DECADRON) 4 MG tablet, Take 2 tablets (8 mg total) by mouth daily. Take daily x 3 days starting the day after cisplatin chemotherapy. Take with food., Disp: 30 tablet, Rfl: 1   diltiazem (CARDIZEM LA) 360 MG 24 hr tablet, Take 1 tablet (360 mg total) by mouth daily., Disp: 30 tablet, Rfl: 3   gabapentin (NEURONTIN) 300 MG capsule, Take 1 capsule by mouth 3 (three) times daily., Disp: , Rfl:    levothyroxine (SYNTHROID) 112 MCG tablet, TAKE 1 TABLET BY MOUTH  DAILY, Disp: 90 tablet, Rfl: 0   lidocaine (XYLOCAINE) 2 % solution, Patient: Mix 1part 2% viscous lidocaine, 1part H20. Swish & swallow 83mL of diluted mixture, 68min before meals and at bedtime, up to QID, Disp: 200 mL, Rfl: 4   lidocaine-prilocaine (EMLA) cream, Apply to affected area once, Disp: 30 g, Rfl: 3   Morphine Sulfate (MORPHINE CONCENTRATE) 10 mg / 0.5 ml concentrated solution, Take 0.25-0.5 mLs (5-10 mg total) by mouth every 6 (six) hours as needed for severe pain., Disp: 30 mL, Rfl: 0   Multiple Vitamin (MULTIVITAMIN) tablet, Take 1 tablet by mouth daily., Disp: , Rfl:    nitroGLYCERIN (NITROSTAT) 0.4 MG SL tablet, Place 1 tablet (0.4 mg total) under the tongue every 5 (five) minutes as needed., Disp: 25 tablet, Rfl: 6   Nutritional Supplements (FEEDING SUPPLEMENT,  OSMOLITE 1.5 CAL,) LIQD, Place into feeding tube daily. 4-5 cartons daily, Disp: , Rfl:    omeprazole (PRILOSEC) 20 MG capsule, Take 1 capsule (20 mg total) by mouth daily., Disp: 30 capsule, Rfl: 1   ondansetron (ZOFRAN) 8 MG tablet, Take 1 tablet (8 mg total) by mouth 2 (two) times daily as needed. Start on the third day after cisplatin chemotherapy., Disp: 30 tablet, Rfl: 1   Polyethylene Glycol 3350 (MIRALAX PO), Take by mouth as needed., Disp: , Rfl:    prochlorperazine (COMPAZINE) 10 MG tablet, Take 1 tablet (10 mg total) by mouth every 6 (six) hours as needed (Nausea or vomiting)., Disp: 30 tablet, Rfl: 1   simvastatin (ZOCOR) 40 MG tablet, TAKE 1 TABLET BY MOUTH AT  BEDTIME, Disp: 90 tablet, Rfl: 0   traZODone (DESYREL) 50 MG tablet, Take 1 tablet (50 mg total) by mouth at bedtime., Disp: 90 tablet, Rfl: 1  PHYSICAL EXAM: ECOG FS:1 - Symptomatic but completely ambulatory    Vitals:   05/03/21 1351  BP: 127/63  Pulse: 93  Resp: 18  Temp: 97.7 F (36.5 C)  TempSrc: Oral  SpO2: 99%   Physical Exam Vitals and nursing note reviewed.  Constitutional:      General: She is not in acute distress.    Appearance: She is not ill-appearing.  HENT:     Head: Normocephalic and atraumatic.     Right Ear: Tympanic membrane and external ear normal.     Left Ear: External ear normal. No decreased hearing noted. No drainage, swelling or tenderness. A middle ear effusion is present. There is no impacted cerumen. No mastoid tenderness. Tympanic membrane is not erythematous or bulging.     Nose: Nose normal.     Mouth/Throat:     Mouth: Mucous membranes are moist.     Pharynx: Oropharynx is clear.     Comments: Thrush on anterior tongue surface only. No open sores. No bleeding. Patient drooling, not able to handle secretions which she tells me is baseline after chemo, unchanged today. Eyes:     General: No scleral icterus.       Right eye:  No discharge.        Left eye: No discharge.      Extraocular Movements: Extraocular movements intact.     Conjunctiva/sclera: Conjunctivae normal.     Pupils: Pupils are equal, round, and reactive to light.  Neck:     Vascular: No JVD.     Comments: Full ROM intact without spinous process TTP. No bony stepoffs or deformities, no paraspinous muscle TTP or muscle spasms. No rigidity or meningeal signs. No bruising, erythema, or swelling.   Cardiovascular:     Rate and Rhythm: Normal rate and regular rhythm.     Pulses: Normal pulses.          Radial pulses are 2+ on the right side and 2+ on the left side.     Heart sounds: Normal heart sounds.  Pulmonary:     Comments: Lungs clear to auscultation in all fields. Symmetric chest rise. No wheezing, rales, or rhonchi. Chest:     Comments: Port in left upper chest without signs of infection. Non tender to palpation.  Left chest wall tender to palpation.  No crepitus, no deformity, no flail chest, no ecchymosis. Abdominal:     Comments: Abdomen is soft, non-distended, and non-tender in all quadrants. No rigidity, no guarding. No peritoneal signs.  G tube without signs of surrounding infection. No drainage.  Musculoskeletal:        General: Normal range of motion.     Cervical back: Normal range of motion.  Skin:    General: Skin is warm and dry.     Capillary Refill: Capillary refill takes less than 2 seconds.     Findings: No rash.  Neurological:     Mental Status: She is oriented to person, place, and time.     GCS: GCS eye subscore is 4. GCS verbal subscore is 5. GCS motor subscore is 6.     Comments: Fluent speech, no facial droop.  Psychiatric:        Behavior: Behavior normal.     GENERAL:alert, no distress and comfortable SKIN: skin color, texture, turgor are normal, no rashes or significant lesions EYES: normal, Conjunctiva are pink and non-injected, sclera clear OROPHARYNX:no exudate, no erythema and lips, buccal mucosa, and tongue normal  NECK: supple, thyroid normal  size, non-tender, without nodularity LYMPH:  no palpable lymphadenopathy in the cervical, axillary or inguinal CHEST: port in left upper chest without si  LUNGS: clear to auscultation and percussion with normal breathing effort HEART: regular rate & rhythm and no murmurs and no lower extremity edema ABDOMEN:abdomen soft, non-tender and normal bowel sounds Musculoskeletal:no cyanosis of digits and no clubbing  NEURO: alert & oriented x 3 with fluent speech, no focal motor/sensory deficits   LABORATORY DATA: I have reviewed the data as listed CBC Latest Ref Rng & Units 05/03/2021 04/29/2021 04/21/2021  WBC 4.0 - 10.5 K/uL 7.2 9.2 2.8(L)  Hemoglobin 12.0 - 15.0 g/dL 8.5(L) 9.3(L) 8.7(L)  Hematocrit 36.0 - 46.0 % 26.7(L) 28.7(L) 26.5(L)  Platelets 150 - 400 K/uL 213 297 225     CMP Latest Ref Rng & Units 05/03/2021 04/29/2021 04/21/2021  Glucose 70 - 99 mg/dL 107(H) 139(H) 186(H)  BUN 8 - 23 mg/dL 21 23 23   Creatinine 0.44 - 1.00 mg/dL 0.59 0.67 0.65  Sodium 135 - 145 mmol/L 134(L) 135 137  Potassium 3.5 - 5.1 mmol/L 3.9 4.2 4.5  Chloride 98 - 111 mmol/L 94(L) 96(L) 100  CO2 22 - 32 mmol/L 28 27 27   Calcium 8.9 -  10.3 mg/dL 9.0 9.2 9.2  Total Protein 6.5 - 8.1 g/dL 7.2 - -  Total Bilirubin 0.3 - 1.2 mg/dL 0.2(L) - -  Alkaline Phos 38 - 126 U/L 68 - -  AST 15 - 41 U/L 20 - -  ALT 0 - 44 U/L 15 - -       RADIOGRAPHIC STUDIES: I have personally reviewed the radiological images as listed and agreed with the findings in the report. No images are attached to the encounter. CT CORONARY MORPH W/CTA COR W/SCORE W/CA W/CM &/OR WO/CM  Addendum Date: 04/22/2021   ADDENDUM REPORT: 04/22/2021 15:15 CLINICAL DATA:  62F with CAD, hypertension, hyperlipidemia, carotid stenosis and back pain. EXAM: Cardiac/Coronary  CT TECHNIQUE: The patient was scanned on a [employer]. FINDINGS: A 120 kV prospective scan was triggered in the descending thoracic aorta at 111 HU's. Axial  non-contrast 3 mm slices were carried out through the heart. The data set was analyzed on a dedicated work station and scored using the [employer]. Gantry rotation speed was 250 msecs and collimation was .6 mm. No beta blockade and 0.8 mg of sl NTG was given. The 3D data set was reconstructed in 5% intervals of the 67-82 % of the R-R cycle. Diastolic phases were analyzed on a dedicated work station using MPR, MIP and VRT modes. The patient received 80 cc of contrast. Aorta: Normal size. Ascending aorta 3.1 cm. Diffuse aortic atherosclerosis. No dissection. Aortic Valve:  Trileaflet.  No calcifications. Coronary Arteries:  Normal coronary origin.  Right dominance. RCA is a large dominant artery that gives rise to PDA and PLVB. There is mild (25-49%) calcified plaque in the mid RCA and minimal (<25%) calcified plaque in the distal RCA. Left main is a large artery that gives rise to LAD, RI, and LCX arteries. LAD is a large vessel that has minimal (<25%) calcified plaque in the proximal and mid LAD. LCX is a non-dominant artery that gives rise to one large OM1 branch. There is no plaque. RI is a small vessel without plaque. Coronary Calcium Score: Left main: 0 Left anterior descending artery: 53.8 Left circumflex artery: 0 Right coronary artery: 203 Total: 257 Percentile: 87th Other findings: Normal pulmonary vein drainage into the left atrium. There are three pulmonary veins on the right and two on the left. Normal let atrial appendage without a thrombus. Normal size of the pulmonary artery. IMPRESSION: 1. Coronary calcium score of 257. This was 87th percentile for age-, race-, and sex-matched controls. 2. Normal coronary origin with right dominance. 3. There is mild (25-49%) plaque in the RCA and minimal (<25%) plaque in the LAD. 4. Recommend aggressive risk factor modification, including LDL goal <70. Skeet Latch, MD Electronically Signed   By: Skeet Latch M.D.   On: 04/22/2021 15:15   Result Date:  04/22/2021 EXAM: OVER-READ INTERPRETATION  CT CHEST The following report is an over-read performed by radiologist Dr. Vinnie Langton of [facility], [place] on 04/22/2021. This over-read does not include interpretation of cardiac or coronary anatomy or pathology. The coronary calcium score/coronary CTA interpretation by the cardiologist is attached. COMPARISON:  None. FINDINGS: Central venous catheter terminating at the superior cavoatrial junction. Atherosclerotic calcifications in the thoracic aorta. Mild emphysematous changes are noted throughout the visualize lungs. Within the visualized portions of the thorax there are no suspicious appearing pulmonary nodules or masses, there is no acute consolidative airspace disease, no pleural effusions, no pneumothorax and no lymphadenopathy. Visualized portions of the upper abdomen are unremarkable.  There are no aggressive appearing lytic or blastic lesions noted in the visualized portions of the skeleton. IMPRESSION: 1.  Aortic Atherosclerosis (ICD10-I70.0). 2.  Emphysema (ICD10-J43.9). Electronically Signed: By: Vinnie Langton M.D. On: 04/22/2021 14:02     ASSESSMENT & PLAN: Patient is a 69 y.o. 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  Her pathologic staging is pT3 N3B. Has completed 6 weekly cycles of cisplatin concurrent with radiation.   1)Fever-patient is afebrile here, last dose of antipyretic was 7 hours prior to arrival.  As she reported fevers at home labs. Labs today: CBC without leukocytosis, ANC 6,000, platelets normal. Hemoglobin is 8.5 which is slight drop from previous x 4 days ago although similar to recent 8.7 x 12 days ago. Patient is not  symptomatic from anemia and does not require emergent transfusion. CMP without severe electrolyte derangement, albumin low at 2.7 likely related to poor nutrition, patient working on improving this. UA without signs of infection. Blood cultures collected. Will cover for possible sinus infection with augmentin. This would also cover for pneumonia, otitis media, UTI should she be developing those. Low suspicion for these at this time as exam reassuring.   2) Left rib pain after mechanical fall- no signs of trauma on exam. No hypoxia and she has normal work of breathing. Xray of left rib and unilateral chest ordered. Discussed supportive care, if imaging is negative can follow up with pcp for ongoing pain.  3) Thrush- signs of early thrush on anterior tongue without severe disease. No mucositis. Patient at baseline handling secretions. Will recommend she continue baking soda rinses and prescription sent for Nystatin.   4) Squamous cell carcinoma of oral cavity- Continue per treatment plan by primary oncologist and  radiation oncology. Next scheduled appointments with rad onc Dr. Isidore Moos in x 2 days (05/05/21) and has lymphedema treatment scheduled that same day.  Patient discussed with oncologist Dr. Burr Medico as she is covering Dr. Rob Hickman patients at this time. She agrees with plan of care.   Visit Diagnosis: 1. Thrush   2. Fall, initial encounter   3. Acute non-recurrent maxillary sinusitis   4. 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HCC)      Orders Placed This Encounter  Procedures   DG Ribs Unilateral W/Chest Left    Standing Status:   Future    Number of Occurrences:   1    Standing Expiration Date:   05/03/2022    Order Specific Question:   Reason for Exam (SYMPTOM  OR DIAGNOSIS REQUIRED)    Answer:   cp after fall x 2 days ago    Order Specific Question:   Preferred imaging location?    Answer:   [facility]    All questions were answered. The patient knows to call the clinic  with any problems, questions or concerns. No barriers to learning was detected.  I spent 30 minutes counseling the patient face to face. The total time spent in the appointment was 30 minutes and more than 50% was on counseling and review of test results  Thank you for allowing me to participate in the care of this patient.    [name], PA-C 05/03/2021 4:55 PM

## 2021-05-04 ENCOUNTER — Telehealth: Payer: Self-pay | Admitting: Physician Assistant

## 2021-05-04 ENCOUNTER — Other Ambulatory Visit: Payer: Self-pay | Admitting: Physician Assistant

## 2021-05-04 DIAGNOSIS — W19XXXA Unspecified fall, initial encounter: Secondary | ICD-10-CM

## 2021-05-04 NOTE — Progress Notes (Signed)
Patient seen yesterday in [facility] clinic with left sided chest pain after mechanical fall x 2 days ago when she landed on her left side. I received call from raduiologist this AM with impression concerning for angular denisty in left upper lobe to suggest possible pneumonia vs pulmonary contusion vs underlying malignancy. There are no rib fractures of pneumothorax. Patient was started on Augmentin to cover for possible pneumonia. I discussed results with oncologist Dr. Burr Medico (covering for Dr. Chryl Heck) who agrees with plan for follow up in 3 weeks after repeat film. Patient can follow up with Wilber Bihari NP as she is already scheduled to see her for follow up in the coming weeks. Xray has been ordered and scheduling request made. Patient and spouse agreeable with plan of care. I discussed strict return precautions as well as ER precautions should pain or symptoms worsen.  CLINICAL DATA:  Fall 2 days ago   EXAM: LEFT RIBS AND CHEST - 3+ VIEW   COMPARISON:  None.   FINDINGS: Port in the anterior chest wall with tip in distal SVC. Percutaneous gastrostomy tube noted.   3.0 x 1.7 cm angular density in the LEFT upper lobe.   Dedicated views of the LEFT ribs demonstrate no displaced fracture. No pneumothorax.   IMPRESSION: 1. Angular density in the LEFT upper lobe suggest small focus of pneumonia or potentially pulmonary contusion history of trauma. Followup PA and lateral chest X-ray is recommended in 3-4 weeks following trial of antibiotic therapy to ensure resolution and exclude underlying malignancy. 2. No evidence of LEFT rib fracture. 3. No pneumothorax.   These results will be called to the ordering clinician or representative by the Radiologist Assistant, and communication documented in the PACS or [employer].     Electronically Signed   By: Suzy Bouchard M.D.   On: 05/04/2021 09:40

## 2021-05-04 NOTE — Telephone Encounter (Signed)
Opened in error. Please disregard.

## 2021-05-05 ENCOUNTER — Encounter: Payer: Medicare Other | Admitting: Physical Therapy

## 2021-05-05 ENCOUNTER — Inpatient Hospital Stay: Payer: Medicare Other | Admitting: Dietician

## 2021-05-05 ENCOUNTER — Other Ambulatory Visit: Payer: Self-pay

## 2021-05-05 ENCOUNTER — Ambulatory Visit
Admission: RE | Admit: 2021-05-05 | Discharge: 2021-05-05 | Disposition: A | Discharge status: Home / Self Care | Payer: Medicare Other | Source: Ambulatory Visit | Attending: Radiation Oncology | Admitting: Radiation Oncology

## 2021-05-05 VITALS — BP 113/45 | HR 82 | Temp 97.9°F | Resp 20 | Ht 66.0 in | Wt 160.8 lb

## 2021-05-05 DIAGNOSIS — I10 Essential (primary) hypertension: Secondary | ICD-10-CM | POA: Insufficient documentation

## 2021-05-05 DIAGNOSIS — Z7982 Long term (current) use of aspirin: Secondary | ICD-10-CM | POA: Diagnosis not present

## 2021-05-05 DIAGNOSIS — C031 Malignant neoplasm of lower gum: Secondary | ICD-10-CM | POA: Insufficient documentation

## 2021-05-05 DIAGNOSIS — E785 Hyperlipidemia, unspecified: Secondary | ICD-10-CM | POA: Insufficient documentation

## 2021-05-05 DIAGNOSIS — R1110 Vomiting, unspecified: Secondary | ICD-10-CM | POA: Insufficient documentation

## 2021-05-05 DIAGNOSIS — I2510 Atherosclerotic heart disease of native coronary artery without angina pectoris: Secondary | ICD-10-CM | POA: Diagnosis not present

## 2021-05-05 DIAGNOSIS — R6883 Chills (without fever): Secondary | ICD-10-CM | POA: Diagnosis not present

## 2021-05-05 DIAGNOSIS — R51 Headache with orthostatic component, not elsewhere classified: Secondary | ICD-10-CM | POA: Diagnosis not present

## 2021-05-05 DIAGNOSIS — R251 Tremor, unspecified: Secondary | ICD-10-CM | POA: Insufficient documentation

## 2021-05-05 DIAGNOSIS — M549 Dorsalgia, unspecified: Secondary | ICD-10-CM | POA: Insufficient documentation

## 2021-05-05 DIAGNOSIS — C060 Malignant neoplasm of cheek mucosa: Secondary | ICD-10-CM

## 2021-05-05 DIAGNOSIS — Z931 Gastrostomy status: Secondary | ICD-10-CM | POA: Diagnosis not present

## 2021-05-05 DIAGNOSIS — Z7952 Long term (current) use of systemic steroids: Secondary | ICD-10-CM | POA: Insufficient documentation

## 2021-05-05 NOTE — Progress Notes (Signed)
Nutrition Follow-up:  Patient has completed concurrent chemoradiation with weekly cisplatin. Final radiation therapy on 10/10.  Met with patient and husband in clinic. Patient reports she has not been feeling well, having intermittent chills and is fatigued. Patient had a fall at home last weekend. Noted CXR with no rib fractures or pneumothorax, concerning angular density in left upper lobe suggesting possible pneumonia vs pulmonary contusion, vs underlying malignancy; repeat chest x-ray planned in 3 weeks. Patient reports mouth sores have improved, she continues to have sore throat, dry mouth, thick saliva. She is using baking soda salt water rinses several times/day. Patient is not eating or drinking orally. Patient previously drinking [employer] for added calories and protein, but after an episode of nausea with vomiting she is no longer drinking. Patient recommended for thin liquids per SLP. Patient reports she has not felt well, has not tried taking in liquids orally or completing HEP in the last couple of weeks. Patient is tolerating tube feedings well, reports 6 cartons Osmolite 1.5 via tube split over 4 feedings/day.   Medications: Augmentin, Nystatin  Labs: 10/24 labs reviewed  Anthropometrics: Weight 160 lb 12.8 oz today stable. Patient weighed 159 lb 12.8 oz on 10/20 and 160 lb 2 oz on 10/12   NUTRITION DIAGNOSIS: Inadequate oral intake ongoing, pt relying on tube feedings   INTERVENTION:  Continue 6 cartons Osmolite 1.5 via tube split over four feedings/day Encouraged taking small sips of thin liquids through out the day, diet advancement per SLP  Suggested pt try Premier Protein clear once daily by mouth for added protein (90 kcal, 20 grams protein) Patient provided sample of Ensure Clear to try Encouraged patient to complete daily HEP per SLP Continue baking soda, salt water rinses several times/day Provided support and encouragement Patient has contact  information   MONITORING, EVALUATION, GOAL: weight trends, intake, tube feedings   NEXT VISIT: via telephone ~2 weeks

## 2021-05-05 NOTE — Progress Notes (Signed)
Mr. Tammy Brady presents today for follow-up after completing radiation to her mandible on 04/19/2021  Pain issues, if any: Reports persistent left ear pain, and left sided headaches. She describes the headaches as stabbing. States pain subsides some with OTC Tylenol, but returns as medication wears off Using a feeding tube?: Yes--receiving all nutrition/fluids via PEG Weight changes, if any:  Wt Readings from Last 3 Encounters:  05/05/21 160 lb 12.8 oz (72.9 kg)  04/29/21 159 lb 12.8 oz (72.5 kg)  04/21/21 160 lb 2 oz (72.6 kg)   Swallowing issues, if any: Yes--unable to take anything by mouth Smoking or chewing tobacco? None Using fluoride trays daily? N/A Last ENT visit was on: Not since diagnosis  Other notable issues, if any: Sustained a fall last Saturday. Was evaluated in [facility] yesterday (had lab work and chest x-ray performed; will have repeat chest x-ray in 3 weeks). Reports on-going chills and body shakes, as well as constant unpleasant taste in her mouth. Continues to deal with thick/copious saliva. Reports vomiting x4 last night (denies any precipatory nausea; states she burped and then immediately vomited). Described emesis as watery with white/grainy pellets. Denies any bowel or bladder issues. Agrees to be seen again in [facility] this Friday for repeat lab work and possible IVF

## 2021-05-05 NOTE — Progress Notes (Signed)
Oncology Nurse Navigator Documentation   I met with Tammy Brady and her husband while they were here to see Dr. Isidore Moos for follow up today. She is feeling better than a few days ago but will come for IVF on 10/28 in symptom management. She is drinking water and using her feeding tube for all other nutrition. She is seeing her surgeon at [facility] on November 18th for follow up after surgery and treatment. She will see Dr. Isidore Moos the 2nd week of January 2023 for CT scan results. They know to call me if she has any concerns or questions.   Harlow Asa RN, BSN, OCN Head & Neck Oncology Nurse [place] at [facility] Phone # [phone]  Fax # [phone]

## 2021-05-06 DIAGNOSIS — R1310 Dysphagia, unspecified: Secondary | ICD-10-CM | POA: Diagnosis not present

## 2021-05-06 DIAGNOSIS — C060 Malignant neoplasm of cheek mucosa: Secondary | ICD-10-CM | POA: Diagnosis not present

## 2021-05-07 ENCOUNTER — Inpatient Hospital Stay: Payer: Medicare Other

## 2021-05-07 ENCOUNTER — Other Ambulatory Visit: Payer: Self-pay

## 2021-05-07 ENCOUNTER — Inpatient Hospital Stay (HOSPITAL_BASED_OUTPATIENT_CLINIC_OR_DEPARTMENT_OTHER): Payer: Medicare Other | Admitting: Physician Assistant

## 2021-05-07 VITALS — BP 133/64 | HR 72 | Temp 98.3°F | Resp 18 | Wt 161.2 lb

## 2021-05-07 DIAGNOSIS — Z95828 Presence of other vascular implants and grafts: Secondary | ICD-10-CM

## 2021-05-07 DIAGNOSIS — Z87891 Personal history of nicotine dependence: Secondary | ICD-10-CM | POA: Diagnosis not present

## 2021-05-07 DIAGNOSIS — T451X5D Adverse effect of antineoplastic and immunosuppressive drugs, subsequent encounter: Secondary | ICD-10-CM | POA: Diagnosis not present

## 2021-05-07 DIAGNOSIS — R638 Other symptoms and signs concerning food and fluid intake: Secondary | ICD-10-CM

## 2021-05-07 DIAGNOSIS — C060 Malignant neoplasm of cheek mucosa: Secondary | ICD-10-CM | POA: Diagnosis not present

## 2021-05-07 DIAGNOSIS — K1231 Oral mucositis (ulcerative) due to antineoplastic therapy: Secondary | ICD-10-CM | POA: Diagnosis not present

## 2021-05-07 DIAGNOSIS — D701 Agranulocytosis secondary to cancer chemotherapy: Secondary | ICD-10-CM | POA: Diagnosis not present

## 2021-05-07 DIAGNOSIS — E039 Hypothyroidism, unspecified: Secondary | ICD-10-CM | POA: Diagnosis not present

## 2021-05-07 DIAGNOSIS — Z5111 Encounter for antineoplastic chemotherapy: Secondary | ICD-10-CM | POA: Diagnosis not present

## 2021-05-07 LAB — CMP (CANCER CENTER ONLY)
ALT: 17 U/L (ref 0–44)
AST: 19 U/L (ref 15–41)
Albumin: 2.1 g/dL — ABNORMAL LOW (ref 3.5–5.0)
Alkaline Phosphatase: 92 U/L (ref 38–126)
Anion gap: 10 (ref 5–15)
BUN: 17 mg/dL (ref 8–23)
CO2: 29 mmol/L (ref 22–32)
Calcium: 9.8 mg/dL (ref 8.9–10.3)
Chloride: 98 mmol/L (ref 98–111)
Creatinine: 0.68 mg/dL (ref 0.44–1.00)
GFR, Estimated: >60 mL/min (ref >60)
Glucose, Bld: 155 mg/dL — ABNORMAL HIGH (ref 70–99)
Potassium: 4.9 mmol/L (ref 3.5–5.1)
Sodium: 137 mmol/L (ref 135–145)
Total Bilirubin: 0.3 mg/dL (ref 0.3–1.2)
Total Protein: 6.9 g/dL (ref 6.5–8.1)

## 2021-05-07 LAB — CBC WITH DIFFERENTIAL (CANCER CENTER ONLY)
Abs Immature Granulocytes: 0.02 10*3/uL (ref 0.00–0.07)
Basophils Absolute: 0 10*3/uL (ref 0.0–0.1)
Basophils Relative: 0 %
Eosinophils Absolute: 0 10*3/uL (ref 0.0–0.5)
Eosinophils Relative: 0 %
HCT: 25 % — ABNORMAL LOW (ref 36.0–46.0)
Hemoglobin: 8.3 g/dL — ABNORMAL LOW (ref 12.0–15.0)
Immature Granulocytes: 0 %
Lymphocytes Relative: 6 %
Lymphs Abs: 0.3 10*3/uL — ABNORMAL LOW (ref 0.7–4.0)
MCH: 29 pg (ref 26.0–34.0)
MCHC: 33.2 g/dL (ref 30.0–36.0)
MCV: 87.4 fL (ref 80.0–100.0)
Monocytes Absolute: 0.5 10*3/uL (ref 0.1–1.0)
Monocytes Relative: 10 %
Neutro Abs: 4.2 10*3/uL (ref 1.7–7.7)
Neutrophils Relative %: 84 %
Platelet Count: 221 10*3/uL (ref 150–400)
RBC: 2.86 MIL/uL — ABNORMAL LOW (ref 3.87–5.11)
RDW: 18.7 % — ABNORMAL HIGH (ref 11.5–15.5)
WBC Count: 5 10*3/uL (ref 4.0–10.5)
nRBC: 0 % (ref 0.0–0.2)

## 2021-05-07 LAB — MAGNESIUM: Magnesium: 1.8 mg/dL (ref 1.7–2.4)

## 2021-05-07 MED ORDER — SODIUM CHLORIDE 0.9% FLUSH
10.0000 mL | Freq: Once | INTRAVENOUS | Status: AC
  Administered 2021-05-07: 10 mL

## 2021-05-07 MED ORDER — HEPARIN SOD (PORK) LOCK FLUSH 100 UNIT/ML IV SOLN
500.0000 [IU] | Freq: Once | INTRAVENOUS | Status: AC
  Administered 2021-05-07: 500 [IU]

## 2021-05-07 MED ORDER — SODIUM CHLORIDE 0.9 % IV SOLN: Freq: Once | INTRAVENOUS | Status: AC

## 2021-05-07 NOTE — Progress Notes (Signed)
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[place])  12/08/2020 Initial Diagnosis   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  Patient seen in clinic x4 days ago with fever, sinus pressure and left-sided chest pain after mechanical fall.  She was prescribed Augmentin for possible pneumonia and plan was for repeat chest x-ray in 3 weeks.  Patient states since coming to clinic she still has having chills.  T-max at home has been 100 axillary.  That was last night.  She has been without Tylenol for 24 hours.  She has been trying to increase her fluid intake although does not feel she is getting enough fluids.  She is still having a headache.  She has a history of headaches and they feel similar.  Headache typically resolves with Tylenol.  No sick contacts or COVID exposures.  Patient states she is tolerating Augmentin well, has  not had any further episodes of vomiting besides the first day of taking it.  She is not having any diarrhea or urinary symptoms.   Review of Systems  Constitutional:  Positive for chills. Negative for fever.  HENT:  Negative for congestion, ear pain, nosebleeds, sinus pain and sore throat.   Eyes:  Negative for pain.  Respiratory:  Positive for sputum production. Negative for cough, hemoptysis, shortness of breath and wheezing.   Cardiovascular:  Negative for chest pain.  Gastrointestinal:  Negative for abdominal pain, constipation, diarrhea, nausea and vomiting.  Genitourinary:  Negative for dysuria and frequency.  Musculoskeletal:  Negative for myalgias.  Skin:  Negative for itching and rash.  Neurological:  Positive for headaches. Negative for dizziness, focal weakness, seizures and weakness.  Psychiatric/Behavioral:  The patient is not nervous/anxious.       Allergies  Allergen Reactions   Codeine Other (See Comments)    Headache  HEADACHE     Past Medical History:  Diagnosis Date   Aortic atherosclerosis (HCC)    CAD (coronary artery disease)    Mild non-obstructive disease by cath January 2011. Coronary CTA showed  mild (25-49%) plaque in the RCA and minimal (<25%) coronary CTA 04/2021 with coronary Ca score 257   Carotid artery disease (HCC)    Followed by Dr Kellie Simmering   GERD (gastroesophageal reflux disease)    Hyperlipidemia    Hypertension    Hypothyroidism      Past Surgical History:  Procedure Laterality Date  APPENDECTOMY     IR IMAGING GUIDED PORT INSERTION  03/02/2021    Social History   Socioeconomic History   Marital status: Married    Spouse name: Not on file   Number of children: Not on file   Years of education: Not on file   Highest education level: Not on file  Occupational History   Occupation: Dealer    Employer: [employer]  Tobacco Use   Smoking status: Former    Packs/day: 1.00    Years: 40.00    Pack years: 40.00    Types:  Cigarettes    Quit date: 05/11/2009    Years since quitting: 11.9   Smokeless tobacco: Never  Substance and Sexual Activity   Alcohol use: Not Currently   Drug use: No   Sexual activity: Not Currently  Other Topics Concern   Not on file  Social History Narrative   Not on file   Social Determinants of Health   Financial Resource Strain: Low Risk    Difficulty of Paying Living Expenses: Not hard at all  Food Insecurity: No Food Insecurity   Worried About Charity fundraiser in the Last Year: Never true   [place] in the Last Year: Never true  Transportation Needs: No Transportation Needs   Lack of Transportation (Medical): No   Lack of Transportation (Non-Medical): No  Physical Activity: Not on file  Stress: No Stress Concern Present   Feeling of Stress : Only a little  Social Connections: Engineer, building services of Communication with Friends and Family: More than three times a week   Frequency of Social Gatherings with Friends and Family: More than three times a week   Attends Religious Services: More than 4 times per year   Active Member of [employer] or Organizations: Yes   Attends Music therapist: More than 4 times per year   Marital Status: Married  Human resources officer Violence: Not on file    Family History  Problem Relation Age of Onset   Cancer Mother    Coronary artery disease Father    Coronary artery disease Maternal Grandmother      Current Outpatient Medications:    acetaminophen (TYLENOL) 500 MG tablet, Take 500 mg by mouth every 6 (six) hours as needed., Disp: , Rfl:    amoxicillin-clavulanate (AUGMENTIN) 250-125 MG tablet, Take 1 tablet by mouth 3 (three) times daily for 7 days., Disp: 21 tablet, Rfl: 0   aspirin EC 81 MG tablet, Take 1 tablet (81 mg total) by mouth daily. Swallow whole., Disp: 90 tablet, Rfl: 3   calcium carbonate (OS-CAL) 600 MG TABS, Take 600 mg by mouth daily., Disp: , Rfl:    dexamethasone (DECADRON) 4 MG tablet,  Take 2 tablets (8 mg total) by mouth daily. Take daily x 3 days starting the day after cisplatin chemotherapy. Take with food., Disp: 30 tablet, Rfl: 1   diltiazem (CARDIZEM LA) 360 MG 24 hr tablet, Take 1 tablet (360 mg total) by mouth daily., Disp: 30 tablet, Rfl: 3   gabapentin (NEURONTIN) 300 MG capsule, Take 1 capsule by mouth 3 (three) times daily., Disp: , Rfl:    levothyroxine (SYNTHROID) 112 MCG tablet, TAKE 1 TABLET BY MOUTH  DAILY, Disp: 90 tablet, Rfl: 0   lidocaine (XYLOCAINE) 2 % solution, Patient: Mix 1part 2% viscous lidocaine, 1part H20. Swish & swallow 63mL of diluted mixture, 45min before meals and at bedtime, up to QID, Disp: 200 mL, Rfl: 4  lidocaine-prilocaine (EMLA) cream, Apply to affected area once, Disp: 30 g, Rfl: 3   Morphine Sulfate (MORPHINE CONCENTRATE) 10 mg / 0.5 ml concentrated solution, Take 0.25-0.5 mLs (5-10 mg total) by mouth every 6 (six) hours as needed for severe pain., Disp: 30 mL, Rfl: 0   Multiple Vitamin (MULTIVITAMIN) tablet, Take 1 tablet by mouth daily., Disp: , Rfl:    nitroGLYCERIN (NITROSTAT) 0.4 MG SL tablet, Place 1 tablet (0.4 mg total) under the tongue every 5 (five) minutes as needed., Disp: 25 tablet, Rfl: 6   Nutritional Supplements (FEEDING SUPPLEMENT, OSMOLITE 1.5 CAL,) LIQD, Place into feeding tube daily. 4-5 cartons daily, Disp: , Rfl:    nystatin (MYCOSTATIN) 100000 UNIT/ML suspension, Take 5 mLs (500,000 Units total) by mouth 4 (four) times daily for 7 days., Disp: 60 mL, Rfl: 0   omeprazole (PRILOSEC) 20 MG capsule, Take 1 capsule (20 mg total) by mouth daily., Disp: 30 capsule, Rfl: 1   ondansetron (ZOFRAN) 8 MG tablet, Take 1 tablet (8 mg total) by mouth 2 (two) times daily as needed. Start on the third day after cisplatin chemotherapy., Disp: 30 tablet, Rfl: 1   Polyethylene Glycol 3350 (MIRALAX PO), Take by mouth as needed., Disp: , Rfl:    prochlorperazine (COMPAZINE) 10 MG tablet, Take 1 tablet (10 mg total) by mouth every 6 (six)  hours as needed (Nausea or vomiting)., Disp: 30 tablet, Rfl: 1   simvastatin (ZOCOR) 40 MG tablet, TAKE 1 TABLET BY MOUTH AT  BEDTIME, Disp: 90 tablet, Rfl: 0   traZODone (DESYREL) 50 MG tablet, Take 1 tablet (50 mg total) by mouth at bedtime., Disp: 90 tablet, Rfl: 1  PHYSICAL EXAM: ECOG FS:1 - Symptomatic but completely ambulatory    Vitals:   05/07/21 1210 05/07/21 1420  BP: (!) 140/54 133/64  Pulse: 89 72  Resp: 19 18  Temp: 97.9 F (36.6 C) 98.3 F (36.8 C)  TempSrc: Oral Oral  SpO2: 99% 93%  Weight: 161 lb 3.2 oz (73.1 kg)    Physical Exam Vitals and nursing note reviewed.  Constitutional:      General: She is not in acute distress.    Appearance: She is not ill-appearing.  HENT:     Head: Normocephalic and atraumatic.     Right Ear: Tympanic membrane and external ear normal.     Left Ear: Tympanic membrane and external ear normal.     Nose: Nose normal.     Comments: No sinus pressure    Mouth/Throat:     Mouth: Mucous membranes are moist.     Pharynx: Oropharynx is clear.     Comments: No signs of thrush or mucositis in oral cavity. No open sores. No bleeding. Patient drooling, not able to handle secretions which she tells me is baseline after radiation, unchanged today. Eyes:     General: No scleral icterus.       Right eye: No discharge.        Left eye: No discharge.     Extraocular Movements: Extraocular movements intact.     Conjunctiva/sclera: Conjunctivae normal.     Pupils: Pupils are equal, round, and reactive to light.  Neck:     Vascular: No JVD.  Cardiovascular:     Rate and Rhythm: Normal rate and regular rhythm.     Pulses: Normal pulses.          Radial pulses are 2+ on the right side and 2+ on the left side.     Heart sounds: Normal  heart sounds.  Pulmonary:     Comments: Lungs clear to auscultation in all fields. Symmetric chest rise. No wheezing, rales, or rhonchi. Chest:     Comments: Port in left upper chest without signs of  infection. Abdominal:     Comments: Abdomen is soft, non-distended, and non-tender in all quadrants. No rigidity, no guarding. No peritoneal signs.  G-tube without signs of infection  Musculoskeletal:        General: Normal range of motion.     Cervical back: Normal range of motion.  Skin:    General: Skin is warm and dry.     Capillary Refill: Capillary refill takes less than 2 seconds.  Neurological:     Mental Status: She is oriented to person, place, and time.     GCS: GCS eye subscore is 4. GCS verbal subscore is 5. GCS motor subscore is 6.     Comments: Fluent speech, no facial droop.  Psychiatric:        Behavior: Behavior normal.        LABORATORY DATA: I have reviewed the data as listed CBC Latest Ref Rng & Units 05/07/2021 05/03/2021 04/29/2021  WBC 4.0 - 10.5 K/uL 5.0 7.2 9.2  Hemoglobin 12.0 - 15.0 g/dL 8.3(L) 8.5(L) 9.3(L)  Hematocrit 36.0 - 46.0 % 25.0(L) 26.7(L) 28.7(L)  Platelets 150 - 400 K/uL 221 213 297     CMP Latest Ref Rng & Units 05/07/2021 05/03/2021 04/29/2021  Glucose 70 - 99 mg/dL 155(H) 107(H) 139(H)  BUN 8 - 23 mg/dL 17 21 23   Creatinine 0.44 - 1.00 mg/dL 0.68 0.59 0.67  Sodium 135 - 145 mmol/L 137 134(L) 135  Potassium 3.5 - 5.1 mmol/L 4.9 3.9 4.2  Chloride 98 - 111 mmol/L 98 94(L) 96(L)  CO2 22 - 32 mmol/L 29 28 27   Calcium 8.9 - 10.3 mg/dL 9.8 9.0 9.2  Total Protein 6.5 - 8.1 g/dL 6.9 7.2 -  Total Bilirubin 0.3 - 1.2 mg/dL 0.3 0.2(L) -  Alkaline Phos 38 - 126 U/L 92 68 -  AST 15 - 41 U/L 19 20 -  ALT 0 - 44 U/L 17 15 -       RADIOGRAPHIC STUDIES: I have personally reviewed the radiological images as listed and agreed with the findings in the report. No images are attached to the encounter. DG Ribs Unilateral W/Chest Left  Result Date: 05/04/2021 CLINICAL DATA:  Fall 2 days ago EXAM: LEFT RIBS AND CHEST - 3+ VIEW COMPARISON:  None. FINDINGS: Port in the anterior chest wall with tip in distal SVC. Percutaneous gastrostomy tube  noted. 3.0 x 1.7 cm angular density in the LEFT upper lobe. Dedicated views of the LEFT ribs demonstrate no displaced fracture. No pneumothorax. IMPRESSION: 1. Angular density in the LEFT upper lobe suggest small focus of pneumonia or potentially pulmonary contusion history of trauma. Followup PA and lateral chest X-ray is recommended in 3-4 weeks following trial of antibiotic therapy to ensure resolution and exclude underlying malignancy. 2. No evidence of LEFT rib fracture. 3. No pneumothorax. These results will be called to the ordering clinician or representative by the Radiologist Assistant, and communication documented in the PACS or [employer]. Electronically Signed   By: Suzy Bouchard M.D.   On: 05/04/2021 09:40   CT CORONARY MORPH W/CTA COR W/SCORE W/CA W/CM &/OR WO/CM  Addendum Date: 04/22/2021   ADDENDUM REPORT: 04/22/2021 15:15 CLINICAL DATA:  68F with CAD, hypertension, hyperlipidemia, carotid stenosis and back pain. EXAM: Cardiac/Coronary  CT TECHNIQUE: The  patient was scanned on a [employer]. FINDINGS: A 120 kV prospective scan was triggered in the descending thoracic aorta at 111 HU's. Axial non-contrast 3 mm slices were carried out through the heart. The data set was analyzed on a dedicated work station and scored using the [place]. Gantry rotation speed was 250 msecs and collimation was .6 mm. No beta blockade and 0.8 mg of sl NTG was given. The 3D data set was reconstructed in 5% intervals of the 67-82 % of the R-R cycle. Diastolic phases were analyzed on a dedicated work station using MPR, MIP and VRT modes. The patient received 80 cc of contrast. Aorta: Normal size. Ascending aorta 3.1 cm. Diffuse aortic atherosclerosis. No dissection. Aortic Valve:  Trileaflet.  No calcifications. Coronary Arteries:  Normal coronary origin.  Right dominance. RCA is a large dominant artery that gives rise to PDA and PLVB. There is mild (25-49%) calcified plaque in the mid RCA and  minimal (<25%) calcified plaque in the distal RCA. Left main is a large artery that gives rise to LAD, RI, and LCX arteries. LAD is a large vessel that has minimal (<25%) calcified plaque in the proximal and mid LAD. LCX is a non-dominant artery that gives rise to one large OM1 branch. There is no plaque. RI is a small vessel without plaque. Coronary Calcium Score: Left main: 0 Left anterior descending artery: 53.8 Left circumflex artery: 0 Right coronary artery: 203 Total: 257 Percentile: 87th Other findings: Normal pulmonary vein drainage into the left atrium. There are three pulmonary veins on the right and two on the left. Normal let atrial appendage without a thrombus. Normal size of the pulmonary artery. IMPRESSION: 1. Coronary calcium score of 257. This was 87th percentile for age-, race-, and sex-matched controls. 2. Normal coronary origin with right dominance. 3. There is mild (25-49%) plaque in the RCA and minimal (<25%) plaque in the LAD. 4. Recommend aggressive risk factor modification, including LDL goal <70. Skeet Latch, MD Electronically Signed   By: Skeet Latch M.D.   On: 04/22/2021 15:15   Result Date: 04/22/2021 EXAM: OVER-READ INTERPRETATION  CT CHEST The following report is an over-read performed by radiologist Dr. Vinnie Langton of [facility], [place] on 04/22/2021. This over-read does not include interpretation of cardiac or coronary anatomy or pathology. The coronary calcium score/coronary CTA interpretation by the cardiologist is attached. COMPARISON:  None. FINDINGS: Central venous catheter terminating at the superior cavoatrial junction. Atherosclerotic calcifications in the thoracic aorta. Mild emphysematous changes are noted throughout the visualize lungs. Within the visualized portions of the thorax there are no suspicious appearing pulmonary nodules or masses, there is no acute consolidative airspace disease, no pleural effusions, no pneumothorax and no  lymphadenopathy. Visualized portions of the upper abdomen are unremarkable. There are no aggressive appearing lytic or blastic lesions noted in the visualized portions of the skeleton. IMPRESSION: 1.  Aortic Atherosclerosis (ICD10-I70.0). 2.  Emphysema (ICD10-J43.9). Electronically Signed: By: Vinnie Langton M.D. On: 04/22/2021 14:02     ASSESSMENT & PLAN: Patient is a 70 y.o. 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  Her pathologic staging is pT3 N3B. Has completed 6 weekly cycles of cisplatin concurrent with radiation.  #)Poor PO intake-patient has been trying to  increase fluid intake at home however continues to feel dehydrated.  Labs today show no leukocytosis, hemoglobin 8.3, only slightly dropped from 4 days prior when it was 8.5.  No significant electrolyte derangement, no renal insufficiency, no transaminitis.  Patient given liter of IV fluids and is feeling improved.  Encouraged her to continue to increase fluid intake at home via p.o. and her G-tube as dehydration could be causing her headaches.  Patient knows to continue Tylenol for chills and pain at home as well.  She has 3 days of Augmentin remaining and plans to finish antibiotic course. Patient is scheduled for repeat chest x-ray in 3 weeks and will follow-up with medical oncology.  Discussed strict return precautions.  #) Squamous cell carcinoma of oral cavity-continue treatment per primary oncologist and radiation oncology.   Visit Diagnosis: 1. Poor fluid intake   2. Port-A-Cath in place      No orders of the defined types were placed in this encounter.   All questions were answered. The patient knows to call the clinic with any  problems, questions or concerns. No barriers to learning was detected.  I have spent a total of 20 minutes minutes of face-to-face and non-face-to-face time, preparing to see the patient, obtaining and/or reviewing separately obtained history, performing a medically appropriate examination, counseling and educating the patient, ordering tests,  documenting clinical information in the electronic health record, and care coordination.     Thank you for allowing me to participate in the care of this patient.    [name], PA-C Department of Hematology/Oncology [facility] Phone: [phone]   05/07/2021 5:44 PM

## 2021-05-07 NOTE — Progress Notes (Signed)
Pt tolerated fluids well. Advised to call office with any concerns and to f/u as scheduled. Pt verbalized understanding. Pt ambulated out of facility.

## 2021-05-08 LAB — CULTURE, BLOOD (SINGLE): Culture: NO GROWTH

## 2021-05-10 ENCOUNTER — Encounter: Payer: Self-pay | Admitting: Radiation Oncology

## 2021-05-10 NOTE — Progress Notes (Signed)
Radiation Oncology         [phone] ________________________________  Name: Tammy Brady MRN: [id number]  Date: 05/05/2021  DOB: [birth year]  Follow-Up Visit Note  Outpatient  CC: Tammy Chimes, PA-C  Diagnosis and Prior Radiotherapy:    ICD-10-CM   1. 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  Tammy Brady presents today for follow-up after completing radiation to her mandible on 04/19/2021  Pain issues, if any: Reports persistent left ear pain, and left sided headaches. She describes the headaches as stabbing. States pain subsides some with OTC Tylenol, but returns as medication wears off Using a feeding tube?: Yes--receiving all nutrition/fluids via PEG Weight changes, if any:  Wt Readings from Last 3 Encounters:  05/07/21 161 lb 3.2 oz (73.1 kg)  05/05/21 160 lb 12.8 oz (72.9 kg)  04/29/21 159 lb 12.8 oz (72.5 kg)   Swallowing issues, if any: Yes--unable to take anything by mouth Smoking or chewing tobacco? None Using fluoride trays daily? N/A Last ENT visit was on: Not since diagnosis  Other notable issues, if any: Sustained a fall last Saturday. Was evaluated in [facility] yesterday (had lab work and chest x-ray performed; will have repeat chest x-ray in 3 weeks). Reports on-going chills and body shakes, as well as constant unpleasant taste in her mouth. Continues to deal with thick/copious saliva. Reports vomiting x4 last night (denies any precipatory nausea; states she burped and then immediately vomited). Described emesis as watery with white/grainy pellets. Denies any bowel or bladder issues. Agrees to be seen again in [facility] this Friday for repeat lab work and possible IVF                               ALLERGIES:  is allergic to codeine.  Meds: Current Outpatient  Medications  Medication Sig Dispense Refill   acetaminophen (TYLENOL) 500 MG tablet Take 500 mg by mouth every 6 (six) hours as needed.     amoxicillin-clavulanate (AUGMENTIN) 250-125 MG tablet Take 1 tablet by mouth 3 (three) times daily for 7 days. 21 tablet 0   aspirin EC 81 MG tablet Take 1 tablet (81 mg total) by mouth daily. Swallow whole. 90 tablet 3   calcium carbonate (OS-CAL) 600 MG TABS Take 600 mg by mouth daily.     dexamethasone (DECADRON) 4 MG tablet Take 2 tablets (8 mg total) by mouth daily. Take daily x 3 days starting the day after cisplatin chemotherapy. Take with food. 30 tablet 1   diltiazem (CARDIZEM LA) 360 MG 24 hr tablet Take 1 tablet (360 mg total) by mouth daily. 30 tablet 3   gabapentin (NEURONTIN) 300 MG capsule Take 1 capsule by mouth 3 (three) times daily.     levothyroxine (SYNTHROID) 112 MCG tablet TAKE 1 TABLET BY MOUTH  DAILY 90 tablet 0   lidocaine (XYLOCAINE) 2 % solution Patient: Mix 1part 2% viscous lidocaine, 1part H20. Swish & swallow 72mL of diluted mixture, 40min before meals and at bedtime, up to QID 200 mL 4   lidocaine-prilocaine (EMLA) cream Apply to affected area once 30 g 3   Morphine Sulfate (MORPHINE CONCENTRATE) 10 mg / 0.5 ml concentrated solution Take 0.25-0.5 mLs (5-10 mg  total) by mouth every 6 (six) hours as needed for severe pain. 30 mL 0   Multiple Vitamin (MULTIVITAMIN) tablet Take 1 tablet by mouth daily.     nitroGLYCERIN (NITROSTAT) 0.4 MG SL tablet Place 1 tablet (0.4 mg total) under the tongue every 5 (five) minutes as needed. 25 tablet 6   Nutritional Supplements (FEEDING SUPPLEMENT, OSMOLITE 1.5 CAL,) LIQD Place into feeding tube daily. 4-5 cartons daily     nystatin (MYCOSTATIN) 100000 UNIT/ML suspension Take 5 mLs (500,000 Units total) by mouth 4 (four) times daily for 7 days. 60 mL 0   omeprazole (PRILOSEC) 20 MG capsule Take 1 capsule (20 mg total) by mouth daily. 30 capsule 1   ondansetron (ZOFRAN) 8 MG tablet Take 1 tablet (8  mg total) by mouth 2 (two) times daily as needed. Start on the third day after cisplatin chemotherapy. 30 tablet 1   Polyethylene Glycol 3350 (MIRALAX PO) Take by mouth as needed.     prochlorperazine (COMPAZINE) 10 MG tablet Take 1 tablet (10 mg total) by mouth every 6 (six) hours as needed (Nausea or vomiting). 30 tablet 1   simvastatin (ZOCOR) 40 MG tablet TAKE 1 TABLET BY MOUTH AT  BEDTIME 90 tablet 0   traZODone (DESYREL) 50 MG tablet Take 1 tablet (50 mg total) by mouth at bedtime. 90 tablet 1   No current facility-administered medications for this encounter.    Physical Findings: The patient is in no acute distress. Patient is alert and oriented.  height is 5\' 6"  (1.676 m) and weight is 160 lb 12.8 oz (72.9 kg). Her temperature is 97.9 F (36.6 C). Her blood pressure is 113/45 (abnormal) and her pulse is 82. Her respiration is 20 and oxygen saturation is 96%. .    General: Alert and oriented, in no acute distress, +emesis bag present. HEENT: Head is normocephalic. Extraocular movements are intact. Oropharynx is dry, no visible tumor or thrush. Healing desquamation over lower lips Neck: healing skin in RT fields. Psychiatric: Judgment and insight are intact. Affect is appropriate.    Lab Findings: Lab Results  Component Value Date   WBC 5.0 05/07/2021   HGB 8.3 (L) 05/07/2021   HCT 25.0 (L) 05/07/2021   MCV 87.4 05/07/2021   PLT 221 05/07/2021    Radiographic Findings: DG Ribs Unilateral W/Chest Left  Result Date: 05/04/2021 CLINICAL DATA:  Fall 2 days ago EXAM: LEFT RIBS AND CHEST - 3+ VIEW COMPARISON:  None. FINDINGS: Port in the anterior chest wall with tip in distal SVC. Percutaneous gastrostomy tube noted. 3.0 x 1.7 cm angular density in the LEFT upper lobe. Dedicated views of the LEFT ribs demonstrate no displaced fracture. No pneumothorax. IMPRESSION: 1. Angular density in the LEFT upper lobe suggest small focus of pneumonia or potentially pulmonary contusion history of  trauma. Followup PA and lateral chest X-ray is recommended in 3-4 weeks following trial of antibiotic therapy to ensure resolution and exclude underlying malignancy. 2. No evidence of LEFT rib fracture. 3. No pneumothorax. These results will be called to the ordering clinician or representative by the Radiologist Assistant, and communication documented in the PACS or [employer]. Electronically Signed   By: Suzy Bouchard M.D.   On: 05/04/2021 09:40   CT CORONARY MORPH W/CTA COR W/SCORE W/CA W/CM &/OR WO/CM  Addendum Date: 04/22/2021   ADDENDUM REPORT: 04/22/2021 15:15 CLINICAL DATA:  16F with CAD, hypertension, hyperlipidemia, carotid stenosis and back pain. EXAM: Cardiac/Coronary  CT TECHNIQUE: The patient was scanned on a [employer]  Force scanner. FINDINGS: A 120 kV prospective scan was triggered in the descending thoracic aorta at 111 HU's. Axial non-contrast 3 mm slices were carried out through the heart. The data set was analyzed on a dedicated work station and scored using the [place]. Gantry rotation speed was 250 msecs and collimation was .6 mm. No beta blockade and 0.8 mg of sl NTG was given. The 3D data set was reconstructed in 5% intervals of the 67-82 % of the R-R cycle. Diastolic phases were analyzed on a dedicated work station using MPR, MIP and VRT modes. The patient received 80 cc of contrast. Aorta: Normal size. Ascending aorta 3.1 cm. Diffuse aortic atherosclerosis. No dissection. Aortic Valve:  Trileaflet.  No calcifications. Coronary Arteries:  Normal coronary origin.  Right dominance. RCA is a large dominant artery that gives rise to PDA and PLVB. There is mild (25-49%) calcified plaque in the mid RCA and minimal (<25%) calcified plaque in the distal RCA. Left main is a large artery that gives rise to LAD, RI, and LCX arteries. LAD is a large vessel that has minimal (<25%) calcified plaque in the proximal and mid LAD. LCX is a non-dominant artery that gives rise to one large  OM1 branch. There is no plaque. RI is a small vessel without plaque. Coronary Calcium Score: Left main: 0 Left anterior descending artery: 53.8 Left circumflex artery: 0 Right coronary artery: 203 Total: 257 Percentile: 87th Other findings: Normal pulmonary vein drainage into the left atrium. There are three pulmonary veins on the right and two on the left. Normal let atrial appendage without a thrombus. Normal size of the pulmonary artery. IMPRESSION: 1. Coronary calcium score of 257. This was 87th percentile for age-, race-, and sex-matched controls. 2. Normal coronary origin with right dominance. 3. There is mild (25-49%) plaque in the RCA and minimal (<25%) plaque in the LAD. 4. Recommend aggressive risk factor modification, including LDL goal <70. Skeet Latch, MD Electronically Signed   By: Skeet Latch M.D.   On: 04/22/2021 15:15   Result Date: 04/22/2021 EXAM: OVER-READ INTERPRETATION  CT CHEST The following report is an over-read performed by radiologist Dr. Vinnie Langton of [facility] [place] on 04/22/2021. This over-read does not include interpretation of cardiac or coronary anatomy or pathology. The coronary calcium score/coronary CTA interpretation by the cardiologist is attached. COMPARISON:  None. FINDINGS: Central venous catheter terminating at the superior cavoatrial junction. Atherosclerotic calcifications in the thoracic aorta. Mild emphysematous changes are noted throughout the visualize lungs. Within the visualized portions of the thorax there are no suspicious appearing pulmonary nodules or masses, there is no acute consolidative airspace disease, no pleural effusions, no pneumothorax and no lymphadenopathy. Visualized portions of the upper abdomen are unremarkable. There are no aggressive appearing lytic or blastic lesions noted in the visualized portions of the skeleton. IMPRESSION: 1.  Aortic Atherosclerosis (ICD10-I70.0). 2.  Emphysema (ICD10-J43.9). Electronically  Signed: By: Vinnie Langton M.D. On: 04/22/2021 14:02    Impression/Plan:  Oncology Nurse Navigator Documentation   Ms. Katzman is recovering from treatment with persistent symptoms warranting additional support in [facility].  She will come for IVF on 10/28 in symptom management. She is drinking water and using her feeding tube for all other nutrition. She is seeing her surgeon at [facility] on November 18th. She will see me the 2nd week of January 2023 for restaging CT scan (neck and chest) results. They know to call if she has any concerns or questions.    On date  of service, in total, I spent 20+ minutes on this encounter. Patient was seen in person.  _____________________________________   Eppie Gibson, MD

## 2021-05-11 ENCOUNTER — Other Ambulatory Visit: Payer: Self-pay

## 2021-05-11 ENCOUNTER — Ambulatory Visit: Payer: Medicare Other | Admitting: Physical Therapy

## 2021-05-11 ENCOUNTER — Encounter: Payer: Self-pay | Admitting: Physical Therapy

## 2021-05-11 DIAGNOSIS — R262 Difficulty in walking, not elsewhere classified: Secondary | ICD-10-CM | POA: Insufficient documentation

## 2021-05-11 DIAGNOSIS — I890 Lymphedema, not elsewhere classified: Secondary | ICD-10-CM | POA: Insufficient documentation

## 2021-05-11 DIAGNOSIS — T451X5D Adverse effect of antineoplastic and immunosuppressive drugs, subsequent encounter: Secondary | ICD-10-CM | POA: Diagnosis not present

## 2021-05-11 DIAGNOSIS — R471 Dysarthria and anarthria: Secondary | ICD-10-CM | POA: Insufficient documentation

## 2021-05-11 DIAGNOSIS — L599 Disorder of the skin and subcutaneous tissue related to radiation, unspecified: Secondary | ICD-10-CM | POA: Insufficient documentation

## 2021-05-11 DIAGNOSIS — C060 Malignant neoplasm of cheek mucosa: Secondary | ICD-10-CM | POA: Insufficient documentation

## 2021-05-11 DIAGNOSIS — E039 Hypothyroidism, unspecified: Secondary | ICD-10-CM | POA: Diagnosis not present

## 2021-05-11 DIAGNOSIS — M25612 Stiffness of left shoulder, not elsewhere classified: Secondary | ICD-10-CM | POA: Insufficient documentation

## 2021-05-11 DIAGNOSIS — M6281 Muscle weakness (generalized): Secondary | ICD-10-CM | POA: Insufficient documentation

## 2021-05-11 DIAGNOSIS — K1231 Oral mucositis (ulcerative) due to antineoplastic therapy: Secondary | ICD-10-CM | POA: Diagnosis not present

## 2021-05-11 DIAGNOSIS — Z87891 Personal history of nicotine dependence: Secondary | ICD-10-CM | POA: Diagnosis not present

## 2021-05-11 DIAGNOSIS — M25611 Stiffness of right shoulder, not elsewhere classified: Secondary | ICD-10-CM | POA: Insufficient documentation

## 2021-05-11 DIAGNOSIS — R1311 Dysphagia, oral phase: Secondary | ICD-10-CM | POA: Insufficient documentation

## 2021-05-11 DIAGNOSIS — Z483 Aftercare following surgery for neoplasm: Secondary | ICD-10-CM | POA: Insufficient documentation

## 2021-05-11 DIAGNOSIS — D649 Anemia, unspecified: Secondary | ICD-10-CM | POA: Diagnosis not present

## 2021-05-11 NOTE — Therapy (Signed)
[place] @ [address] [place], [place], [address] Phone: [phone]   Fax:  [phone]  Physical Therapy Treatment  Patient Details  Name: Tammy Brady MRN: [id number] Date of Birth: [birth year] Referring Provider (PT): Reita May Date: 05/11/2021   PT End of Session - 05/11/21 1059     Visit Number 7    Number of Visits 13    Date for PT Re-Evaluation 06/08/21    PT Start Time 1003    PT Stop Time 1055    PT Time Calculation (min) 52 min    Activity Tolerance Patient tolerated treatment well    Behavior During Therapy [facility] for tasks assessed/performed             Past Medical History:  Diagnosis Date   Aortic atherosclerosis ([place])    CAD (coronary artery disease)    Mild non-obstructive disease by cath January 2011. Coronary CTA showed  mild (25-49%) plaque in the RCA and minimal (<25%) coronary CTA 04/2021 with coronary Ca score 257   Carotid artery disease (HCC)    Followed by Dr Kellie Simmering   GERD (gastroesophageal reflux disease)    Hyperlipidemia    Hypertension    Hypothyroidism     Past Surgical History:  Procedure Laterality Date   APPENDECTOMY     IR IMAGING GUIDED PORT INSERTION  03/02/2021    There were no vitals filed for this visit.   Subjective Assessment - 05/11/21 1003     Subjective My neck is healing well. I fell one more time since I have been here. I got up to go to the bathroom and it looked like the floor was moving and I just fell. The next day I had pain in my chest but it only hurt when I took a deep breath. They took an xray and they want to repeat it to see if there is an infection.    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 CT neck same day revealed an enhancing soft tissue lesion along the buccal margin of the left mandible; with lucency in the adjacent bone consistent  with known SCC. Also found were necrotic left level IA/B and II/IIIB lymph nodes, noted to be concerning for nodal metastasis. During physical exam performed at this visit, Dr. Amada Jupiter visualized the exophytic mass in and around the left mandibular canine, premolar, and first molar, along with an enlarged level 1A lymph node. The patient opted to proceed with multiple oral biopsies, mandibular resectioning, and lymphadenectomies, 01/18/21 Resection performed on 01/18/21 revealed the following: 3.3cm tumor, Gr 2 Squamous cell carcinoma of alveolar ridge extending to buccal gingival junction and lingual sulcus, DOI 67mm, neg margins, 6/49 LN+ bilaterally with ECE; no PNI or LVSI; She also underwent Left fibula osteocutaneous free flap and ORIF mandible, will receive 30 fractions of radiation to her oral cavity and bilateral neck and 6 weeks of cisplatin chemotherapy. She started on 8/29 and will complete 04/19/21, PEG placed at [facility] on 01/18/21 and exchanged on 8/5 due to leakage. She uses PEG for most nutrition and medication. She is able to tolerate pureed foods but her reconstructive flap limits her swallowing, PAC placed at [facility] on 03/02/21, reports a 40 pack year smoking history, quitting off and on over the years, stopped smoking in May 2022.    Patient Stated Goals to gain info from providers  Currently in Pain? No/denies    Pain Score 0-No pain                               [facility] Adult PT Treatment/Exercise - 05/11/21 0001       Manual Therapy   Manual Lymphatic Drainage (MLD) in supine with head of bed elevated: short neck, 5 diaphragmatic breaths, bilateral axillary nodes, bilateral pectoral nodes, anterior chest, short neck, posterior, lateral and anterior neck moving fluid towards pathways, submental and submandibular areas moving fluid towards pathways then retracing all steps                          PT Long Term Goals - 05/11/21 1006       PT LONG TERM  GOAL #1   Title Pt will be independent in self MLD for long term management of lymphedema.    Baseline 05/11/21- pt has not been able to try this due to increased fatigue and recent illness    Time 6    Period Weeks    Status On-going      PT LONG TERM GOAL #2   Title Pt will obtain appropriate compression garments for long term management of lymphedema.    Time 6    Period Weeks    Status On-going      PT LONG TERM GOAL #3   Title Pt will report she is able to descend steps with a step through gait pattern and a decreased fear of falling.    Time 6    Period Weeks    Status On-going      PT LONG TERM GOAL #4   Title Pt will be independent in a home exercise program for continued stretching and strengthening.    Time 6    Period Weeks    Status On-going      PT LONG TERM GOAL #5   Title Pt will report her shoulder ROM has improved and she is no longer having trouble getting dressed or reaching overhead.    Baseline 05/11/21- pt still has difficulty getting things overhead    Time 6    Period Weeks    Status On-going      PT LONG TERM GOAL #6   Title Pt will be able to complete 13 sit to stands in 30 seconds without use of UEs to decrease fall risk.    Time 6    Period Weeks    Status On-going                   Plan - 05/11/21 1102     Clinical Impression Statement Pt returns to PT today after completing chemo and radiation. Her skin on her neck has healed well. Her lip swelling has decreased some but she has fullness and tenderness in her chin and anterior neck. Continued with focus to MLD in these areas with improvements noted by end of session. Assessed pt's progress towards goals in therapy. Progress has been minimal since PT had to be stopped for a while because pt was so fatigued from her treatments and also sick with a fever and chills. She would still benefit from skilled PT services to decrease fall risk as pt has had another recent fall, improve strength,  improve ambulation/gait, improve shoulder ROM and decrease neck lymphedema.    PT Frequency 2x / week    PT Duration  6 weeks    PT Treatment/Interventions ADLs/Self Care Home Management;Therapeutic exercise;Balance training;Neuromuscular re-education;Stair training;Gait training;Patient/family education;Orthotic Fit/Training;Manual techniques;Manual lymph drainage;Compression bandaging;Scar mobilization;Passive range of motion;Taping;Vasopneumatic Device;Joint Manipulations    PT Next Visit Plan write 4 wk flexi note and fax to Vicente Males, remeasure neck, measure bilat shoulder ROM and update last goal to include measurements- cont MLD monitoring skin, how are ankle exercises?, PROM to neck, give HEP for LE strengthening, once lymphedema is managed begin bilateral shoulder ROM and LE strengthening and balance    PT Home Exercise Plan head and neck ROM exercises,Access Code [email or web address]    Consulted and Agree with Plan of Care Patient;Family member/caregiver             Patient will benefit from skilled therapeutic intervention in order to improve the following deficits and impairments:  Postural dysfunction, Impaired UE functional use, Impaired flexibility, Increased fascial restricitons, Decreased strength, Decreased knowledge of use of DME, Decreased knowledge of precautions, Decreased range of motion, Decreased scar mobility, Increased edema, Difficulty walking, Decreased balance  Visit Diagnosis: Lymphedema, not elsewhere classified  Disorder of the skin and subcutaneous tissue related to radiation, unspecified  Aftercare following surgery for neoplasm  Muscle weakness (generalized)  Difficulty in walking, not elsewhere classified  Stiffness of left shoulder, not elsewhere classified  Stiffness of right shoulder, not elsewhere classified  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[place])     Problem List Patient Active Problem List   Diagnosis Date Noted   G tube feedings ([place]) 04/29/2021    Aortic atherosclerosis ([place]) 04/25/2021   Chest pain of uncertain etiology 38/04/1750   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 chemotherapy ([place]) 04/07/2021   Mucositis due to antineoplastic therapy 03/31/2021   Drug induced constipation 03/31/2021   Chemotherapy-induced nausea 03/31/2021   Port-A-Cath in place 03/09/2021   Carcinoma of lower gum ([place]) 02/20/2021   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[place]) 12/08/2020   Gastritis 12/08/2020   Anxiety 12/08/2020   Need for prophylactic vaccination and inoculation against influenza 07/30/2020   Depression 11/26/2015   GERD (gastroesophageal reflux disease) 11/26/2015   Migraine headache 11/26/2015   Screening for diabetes mellitus 09/03/2015   Hypothyroidism 07/22/2009   Mixed hyperlipidemia 07/22/2009   Tobacco user 07/22/2009   Hypertension, essential 07/22/2009   CAD, NATIVE VESSEL 07/22/2009   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   Coronary artery spasm ([place]) 07/22/2009    Allyson Sabal Ariton, PT 05/11/2021, 11:07 AM  [place] @ [address] [place], [place], [address] Phone: [phone]   Fax:  [phone]  Name: Tammy Brady MRN: [id number] Date of Birth: [birth year]   Manus Gunning, PT 05/11/21 11:07 AM

## 2021-05-13 ENCOUNTER — Other Ambulatory Visit: Payer: Self-pay

## 2021-05-13 ENCOUNTER — Ambulatory Visit: Payer: Medicare Other | Admitting: Rehabilitation

## 2021-05-13 DIAGNOSIS — I890 Lymphedema, not elsewhere classified: Secondary | ICD-10-CM

## 2021-05-13 DIAGNOSIS — E039 Hypothyroidism, unspecified: Secondary | ICD-10-CM | POA: Diagnosis not present

## 2021-05-13 DIAGNOSIS — K1231 Oral mucositis (ulcerative) due to antineoplastic therapy: Secondary | ICD-10-CM | POA: Diagnosis not present

## 2021-05-13 DIAGNOSIS — L599 Disorder of the skin and subcutaneous tissue related to radiation, unspecified: Secondary | ICD-10-CM

## 2021-05-13 DIAGNOSIS — D649 Anemia, unspecified: Secondary | ICD-10-CM | POA: Diagnosis not present

## 2021-05-13 DIAGNOSIS — Z87891 Personal history of nicotine dependence: Secondary | ICD-10-CM | POA: Diagnosis not present

## 2021-05-13 DIAGNOSIS — C060 Malignant neoplasm of cheek mucosa: Secondary | ICD-10-CM | POA: Diagnosis not present

## 2021-05-13 DIAGNOSIS — T451X5D Adverse effect of antineoplastic and immunosuppressive drugs, subsequent encounter: Secondary | ICD-10-CM | POA: Diagnosis not present

## 2021-05-13 DIAGNOSIS — Z483 Aftercare following surgery for neoplasm: Secondary | ICD-10-CM

## 2021-05-13 NOTE — Therapy (Addendum)
[facility] @ [address] [address] [place], [place], [address] Phone: [phone]   Fax:  [phone]  Physical Therapy Treatment  Patient Details  Name: Tammy Brady MRN: [id number] Date of Birth: 05/21/1951 Referring Provider (PT): Reita May Date: 05/13/2021   PT End of Session - 05/13/21 2039     Visit Number 8    Number of Visits 13    Date for PT Re-Evaluation 06/08/21    PT Start Time 1000    PT Stop Time 1050    PT Time Calculation (min) 50 min    Activity Tolerance Patient tolerated treatment well    Behavior During Therapy [employer] for tasks assessed/performed             Past Medical History:  Diagnosis Date   Aortic atherosclerosis ([place])    CAD (coronary artery disease)    Mild non-obstructive disease by cath January 2011. Coronary CTA showed  mild (25-49%) plaque in the RCA and minimal (<25%) coronary CTA 04/2021 with coronary Ca score 257   Carotid artery disease ([facility])    Followed by Dr Kellie Simmering   GERD (gastroesophageal reflux disease)    Hyperlipidemia    Hypertension    Hypothyroidism     Past Surgical History:  Procedure Laterality Date   APPENDECTOMY     IR IMAGING GUIDED PORT INSERTION  03/02/2021    There were no vitals filed for this visit.   Subjective Assessment - 05/13/21 2036     Subjective Nothing new since last time.    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 CT neck same day revealed an enhancing soft tissue lesion along the buccal margin of the left mandible; with lucency in the adjacent bone consistent with known SCC. Also found were necrotic left level IA/B and II/IIIB lymph nodes, noted to be concerning for nodal metastasis. During physical exam performed at this visit, Dr. Amada Jupiter visualized the exophytic mass in and around the left mandibular canine, premolar, and first molar,  along with an enlarged level 1A lymph node. The patient opted to proceed with multiple oral biopsies, mandibular resectioning, and lymphadenectomies, 01/18/21 Resection performed on 01/18/21 revealed the following: 3.3cm tumor, Gr 2 Squamous cell carcinoma of alveolar ridge extending to buccal gingival junction and lingual sulcus, DOI 60mm, neg margins, 6/49 LN+ bilaterally with ECE; no PNI or LVSI; She also underwent Left fibula osteocutaneous free flap and ORIF mandible, will receive 30 fractions of radiation to her oral cavity and bilateral neck and 6 weeks of cisplatin chemotherapy. She started on 8/29 and will complete 04/19/21, PEG placed at [facility] on 01/18/21 and exchanged on 8/5 due to leakage. She uses PEG for most nutrition and medication. She is able to tolerate pureed foods but her reconstructive flap limits her swallowing, PAC placed at [facility] on 03/02/21, reports a 40 pack year smoking history, quitting off and on over the years, stopped smoking in May 2022.    Currently in Pain? No/denies                   LYMPHEDEMA/ONCOLOGY QUESTIONNAIRE - 05/13/21 0001       Head and Neck   4 cm superior to sternal notch around neck 35 cm (P)     6 cm superior to sternal notch around neck 37 cm (P)     8 cm superior to sternal notch around  neck 39 cm (P)     Other 20.2 (P)    chin strap   Other 12.2 (P)    chin from jowl line to jowl line                       [facility] Adult PT Treatment/Exercise - 05/13/21 0001       Manual Therapy   Manual Therapy Passive ROM    Edema Management remeasured neck    Manual Lymphatic Drainage (MLD) in supine with head of bed elevated: short neck, 5 diaphragmatic breaths, bilateral axillary nodes, bilateral pectoral nodes, anterior chest, short neck, posterior, lateral and anterior neck moving fluid towards pathways, submental and submandibular areas moving fluid towards pathways then retracing all steps.  Reviewing steps with patient and  rehighlighting "milking" step of MLD as pt had not been able to do this due to skin tenderness    Passive ROM to the cspine into rotation and sidebending bil                          PT Long Term Goals - 05/11/21 1006       PT LONG TERM GOAL #1   Title Pt will be independent in self MLD for long term management of lymphedema.    Baseline 05/11/21- pt has not been able to try this due to increased fatigue and recent illness    Time 6    Period Weeks    Status On-going      PT LONG TERM GOAL #2   Title Pt will obtain appropriate compression garments for long term management of lymphedema.    Time 6    Period Weeks    Status On-going      PT LONG TERM GOAL #3   Title Pt will report she is able to descend steps with a step through gait pattern and a decreased fear of falling.    Time 6    Period Weeks    Status On-going      PT LONG TERM GOAL #4   Title Pt will be independent in a home exercise program for continued stretching and strengthening.    Time 6    Period Weeks    Status On-going      PT LONG TERM GOAL #5   Title Pt will report her shoulder ROM has improved and she is no longer having trouble getting dressed or reaching overhead.    Baseline 05/11/21- pt still has difficulty getting things overhead    Time 6    Period Weeks    Status On-going      PT LONG TERM GOAL #6   Title Pt will be able to complete 13 sit to stands in 30 seconds without use of UEs to decrease fall risk.    Time 6    Period Weeks    Status On-going             Pt continues with chronic lymphedema after at least 4 weeks of compression and exercises provided with PT treatment and self management.   Pt demonstrates fibrosis and increased scar tissue with increased size and is at risk for worsening lymphedema   Pt require flexitouch Head and neck garment advanced due to no basic pump on the market to improve difficulty swallowing, impaired speech, edema, and to progress  healing.      Plan - 05/13/21 2039     Clinical Impression  Statement Pt continues to have mainly anterior lip, chin, and anterior neck edema post mandibular resection and lymphadenectomies.  Pt has some decreased measurements at the anterior neck but is still troubled by tightness and fluctuating edema in the anterior neck and lip area which is improved with MLD by PT.  Pt and husband have a difficult time performing MLD at home and would benefit from a vasopneumatic compression pump to decreased fibrosis, anterior neck congestion, and with lip and mouth graft healing.    PT Frequency 2x / week    PT Duration 6 weeks    PT Treatment/Interventions ADLs/Self Care Home Management;Therapeutic exercise;Balance training;Neuromuscular re-education;Stair training;Gait training;Patient/family education;Orthotic Fit/Training;Manual techniques;Manual lymph drainage;Compression bandaging;Scar mobilization;Passive range of motion;Taping;Vasopneumatic Device;Joint Manipulations    PT Next Visit Plan cont MLD,  PROM to neck, once lymphedema is managed with garments and pump begin bilateral shoulder ROM and LE strengthening and balance             Patient will benefit from skilled therapeutic intervention in order to improve the following deficits and impairments:  Postural dysfunction, Impaired UE functional use, Impaired flexibility, Increased fascial restricitons, Decreased strength, Decreased knowledge of use of DME, Decreased knowledge of precautions, Decreased range of motion, Decreased scar mobility, Increased edema, Difficulty walking, Decreased balance  Visit Diagnosis: Disorder of the skin and subcutaneous tissue related to radiation, unspecified  Lymphedema, not elsewhere classified  Aftercare following surgery for neoplasm     Problem List Patient Active Problem List   Diagnosis Date Noted   G tube feedings ([place]) 04/29/2021   Aortic atherosclerosis ([place]) 04/25/2021   Chest pain of  uncertain etiology 32/99/2426   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 chemotherapy ([place]) 04/07/2021   Mucositis due to antineoplastic therapy 03/31/2021   Drug induced constipation 03/31/2021   Chemotherapy-induced nausea 03/31/2021   Port-A-Cath in place 03/09/2021   Carcinoma of lower gum ([employer]) 02/20/2021   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[place]) 12/08/2020   Gastritis 12/08/2020   Anxiety 12/08/2020   Need for prophylactic vaccination and inoculation against influenza 07/30/2020   Depression 11/26/2015   GERD (gastroesophageal reflux disease) 11/26/2015   Migraine headache 11/26/2015   Screening for diabetes mellitus 09/03/2015   Hypothyroidism 07/22/2009   Mixed hyperlipidemia 07/22/2009   Tobacco user 07/22/2009   Hypertension, essential 07/22/2009   CAD, NATIVE VESSEL 07/22/2009   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   Coronary artery spasm ([place]) 07/22/2009    [name], PT 05/13/2021, 8:46 PM  [facility] @ [address] [place], [place], [address] Phone: [phone]   Fax:  [phone]  Name: Tammy Brady MRN: [id number] Date of Birth: [birth year]

## 2021-05-17 ENCOUNTER — Other Ambulatory Visit: Payer: Self-pay

## 2021-05-17 ENCOUNTER — Emergency Department (HOSPITAL_COMMUNITY): Payer: Medicare Other

## 2021-05-17 ENCOUNTER — Encounter (HOSPITAL_COMMUNITY): Payer: Self-pay

## 2021-05-17 ENCOUNTER — Observation Stay (HOSPITAL_COMMUNITY)
Admission: EM | Admit: 2021-05-17 | Discharge: 2021-05-18 | Disposition: A | Discharge status: Home / Self Care | Payer: Medicare Other | Attending: Internal Medicine | Admitting: Internal Medicine

## 2021-05-17 DIAGNOSIS — I2510 Atherosclerotic heart disease of native coronary artery without angina pectoris: Secondary | ICD-10-CM | POA: Diagnosis not present

## 2021-05-17 DIAGNOSIS — D649 Anemia, unspecified: Secondary | ICD-10-CM | POA: Diagnosis present

## 2021-05-17 DIAGNOSIS — R0902 Hypoxemia: Secondary | ICD-10-CM

## 2021-05-17 DIAGNOSIS — Z79899 Other long term (current) drug therapy: Secondary | ICD-10-CM | POA: Diagnosis not present

## 2021-05-17 DIAGNOSIS — I6529 Occlusion and stenosis of unspecified carotid artery: Secondary | ICD-10-CM | POA: Diagnosis present

## 2021-05-17 DIAGNOSIS — R531 Weakness: Secondary | ICD-10-CM | POA: Diagnosis not present

## 2021-05-17 DIAGNOSIS — R0602 Shortness of breath: Secondary | ICD-10-CM | POA: Diagnosis not present

## 2021-05-17 DIAGNOSIS — J189 Pneumonia, unspecified organism: Principal | ICD-10-CM | POA: Diagnosis present

## 2021-05-17 DIAGNOSIS — R6889 Other general symptoms and signs: Secondary | ICD-10-CM | POA: Diagnosis not present

## 2021-05-17 DIAGNOSIS — Z7982 Long term (current) use of aspirin: Secondary | ICD-10-CM | POA: Diagnosis not present

## 2021-05-17 DIAGNOSIS — Z87891 Personal history of nicotine dependence: Secondary | ICD-10-CM | POA: Diagnosis not present

## 2021-05-17 DIAGNOSIS — I10 Essential (primary) hypertension: Secondary | ICD-10-CM | POA: Diagnosis not present

## 2021-05-17 DIAGNOSIS — E440 Moderate protein-calorie malnutrition: Secondary | ICD-10-CM | POA: Diagnosis present

## 2021-05-17 DIAGNOSIS — C060 Malignant neoplasm of cheek mucosa: Secondary | ICD-10-CM | POA: Diagnosis present

## 2021-05-17 DIAGNOSIS — E039 Hypothyroidism, unspecified: Secondary | ICD-10-CM | POA: Diagnosis not present

## 2021-05-17 DIAGNOSIS — R404 Transient alteration of awareness: Secondary | ICD-10-CM | POA: Diagnosis not present

## 2021-05-17 DIAGNOSIS — F32A Depression, unspecified: Secondary | ICD-10-CM | POA: Diagnosis present

## 2021-05-17 DIAGNOSIS — E782 Mixed hyperlipidemia: Secondary | ICD-10-CM | POA: Diagnosis present

## 2021-05-17 DIAGNOSIS — Z20822 Contact with and (suspected) exposure to covid-19: Secondary | ICD-10-CM | POA: Insufficient documentation

## 2021-05-17 DIAGNOSIS — G459 Transient cerebral ischemic attack, unspecified: Secondary | ICD-10-CM | POA: Diagnosis not present

## 2021-05-17 DIAGNOSIS — E871 Hypo-osmolality and hyponatremia: Secondary | ICD-10-CM | POA: Diagnosis present

## 2021-05-17 DIAGNOSIS — J9811 Atelectasis: Secondary | ICD-10-CM | POA: Diagnosis not present

## 2021-05-17 DIAGNOSIS — K219 Gastro-esophageal reflux disease without esophagitis: Secondary | ICD-10-CM | POA: Diagnosis present

## 2021-05-17 DIAGNOSIS — Z743 Need for continuous supervision: Secondary | ICD-10-CM | POA: Diagnosis not present

## 2021-05-17 DIAGNOSIS — I700 Atherosclerosis of aorta: Secondary | ICD-10-CM | POA: Insufficient documentation

## 2021-05-17 DIAGNOSIS — C069 Malignant neoplasm of mouth, unspecified: Secondary | ICD-10-CM | POA: Diagnosis present

## 2021-05-17 LAB — POCT I-STAT, CHEM 8
BUN: 14 mg/dL (ref 8–23)
Calcium, Ion: 1.15 mmol/L (ref 1.15–1.40)
Chloride: 98 mmol/L (ref 98–111)
Creatinine, Ser: 0.5 mg/dL (ref 0.44–1.00)
Glucose, Bld: 96 mg/dL (ref 70–99)
HCT: 29 % — ABNORMAL LOW (ref 36.0–46.0)
Hemoglobin: 9.9 g/dL — ABNORMAL LOW (ref 12.0–15.0)
Potassium: 3.9 mmol/L (ref 3.5–5.1)
Sodium: 133 mmol/L — ABNORMAL LOW (ref 135–145)
TCO2: 27 mmol/L (ref 22–32)

## 2021-05-17 LAB — CBC WITH DIFFERENTIAL/PLATELET
Abs Immature Granulocytes: 0.03 10*3/uL (ref 0.00–0.07)
Basophils Absolute: 0 10*3/uL (ref 0.0–0.1)
Basophils Relative: 0 %
Eosinophils Absolute: 0 10*3/uL (ref 0.0–0.5)
Eosinophils Relative: 0 %
HCT: 26.6 % — ABNORMAL LOW (ref 36.0–46.0)
Hemoglobin: 8.6 g/dL — ABNORMAL LOW (ref 12.0–15.0)
Immature Granulocytes: 1 %
Lymphocytes Relative: 5 %
Lymphs Abs: 0.3 10*3/uL — ABNORMAL LOW (ref 0.7–4.0)
MCH: 28.5 pg (ref 26.0–34.0)
MCHC: 32.3 g/dL (ref 30.0–36.0)
MCV: 88.1 fL (ref 80.0–100.0)
Monocytes Absolute: 0.8 10*3/uL (ref 0.1–1.0)
Monocytes Relative: 14 %
Neutro Abs: 4.8 10*3/uL (ref 1.7–7.7)
Neutrophils Relative %: 80 %
Platelets: 230 10*3/uL (ref 150–400)
RBC: 3.02 MIL/uL — ABNORMAL LOW (ref 3.87–5.11)
RDW: 20.5 % — ABNORMAL HIGH (ref 11.5–15.5)
WBC: 6 10*3/uL (ref 4.0–10.5)
nRBC: 0.3 % — ABNORMAL HIGH (ref 0.0–0.2)

## 2021-05-17 LAB — COMPREHENSIVE METABOLIC PANEL
ALT: 14 U/L (ref 0–44)
AST: 26 U/L (ref 15–41)
Albumin: 2.7 g/dL — ABNORMAL LOW (ref 3.5–5.0)
Alkaline Phosphatase: 71 U/L (ref 38–126)
Anion gap: 11 (ref 5–15)
BUN: 23 mg/dL (ref 8–23)
CO2: 25 mmol/L (ref 22–32)
Calcium: 8.6 mg/dL — ABNORMAL LOW (ref 8.9–10.3)
Chloride: 92 mmol/L — ABNORMAL LOW (ref 98–111)
Creatinine, Ser: 0.75 mg/dL (ref 0.44–1.00)
GFR, Estimated: >60 mL/min (ref >60)
Glucose, Bld: 127 mg/dL — ABNORMAL HIGH (ref 70–99)
Potassium: 4.1 mmol/L (ref 3.5–5.1)
Sodium: 128 mmol/L — ABNORMAL LOW (ref 135–145)
Total Bilirubin: 0.4 mg/dL (ref 0.3–1.2)
Total Protein: 6.9 g/dL (ref 6.5–8.1)

## 2021-05-17 LAB — URINALYSIS, ROUTINE W REFLEX MICROSCOPIC
Bacteria, UA: NONE SEEN
Bilirubin Urine: NEGATIVE
Glucose, UA: NEGATIVE mg/dL
Ketones, ur: NEGATIVE mg/dL
Leukocytes,Ua: NEGATIVE
Nitrite: NEGATIVE
Protein, ur: NEGATIVE mg/dL
Specific Gravity, Urine: 1.013 (ref 1.005–1.030)
pH: 7 (ref 5.0–8.0)

## 2021-05-17 LAB — BLOOD GAS, VENOUS
Acid-Base Excess: 2.7 mmol/L — ABNORMAL HIGH (ref 0.0–2.0)
Bicarbonate: 25.5 mmol/L (ref 20.0–28.0)
O2 Saturation: 99.9 %
Patient temperature: 98.6
pCO2, Ven: 32.9 mmHg — ABNORMAL LOW (ref 44.0–60.0)
pH, Ven: 7.501 — ABNORMAL HIGH (ref 7.250–7.430)
pO2, Ven: 144 mmHg — ABNORMAL HIGH (ref 32.0–45.0)

## 2021-05-17 LAB — RESP PANEL BY RT-PCR (FLU A&B, COVID) ARPGX2
Influenza A by PCR: NEGATIVE
Influenza B by PCR: NEGATIVE
SARS Coronavirus 2 by RT PCR: NEGATIVE

## 2021-05-17 LAB — LACTIC ACID, PLASMA
Lactic Acid, Venous: 1.3 mmol/L (ref 0.5–1.9)
Lactic Acid, Venous: 1 mmol/L (ref 0.5–1.9)

## 2021-05-17 LAB — CBG MONITORING, ED: Glucose-Capillary: 132 mg/dL — ABNORMAL HIGH (ref 70–99)

## 2021-05-17 LAB — LIPASE, BLOOD: Lipase: 21 U/L (ref 11–51)

## 2021-05-17 MED ORDER — ASPIRIN EC 81 MG PO TBEC
81.0000 mg | DELAYED_RELEASE_TABLET | Freq: Every day | ORAL | Status: DC
  Administered 2021-05-17 – 2021-05-18 (×2): 81 mg via ORAL
  Filled 2021-05-17 (×2): qty 1

## 2021-05-17 MED ORDER — TRAZODONE HCL 100 MG PO TABS
50.0000 mg | ORAL_TABLET | Freq: Every day | ORAL | Status: DC
  Administered 2021-05-17: 50 mg via ORAL
  Filled 2021-05-17 (×2): qty 0.5

## 2021-05-17 MED ORDER — DILTIAZEM HCL ER COATED BEADS 180 MG PO CP24
360.0000 mg | ORAL_CAPSULE | Freq: Every day | ORAL | Status: DC
  Administered 2021-05-17 – 2021-05-18 (×2): 360 mg via ORAL
  Filled 2021-05-17 (×2): qty 2

## 2021-05-17 MED ORDER — POLYETHYLENE GLYCOL 3350 17 G PO PACK: 17.0000 g | PACK | Freq: Every day | ORAL | Status: DC | PRN

## 2021-05-17 MED ORDER — CHLORHEXIDINE GLUCONATE CLOTH 2 % EX PADS
6.0000 | MEDICATED_PAD | Freq: Every day | CUTANEOUS | Status: DC
  Administered 2021-05-18: 6 via TOPICAL

## 2021-05-17 MED ORDER — LIDOCAINE VISCOUS HCL 2 % MT SOLN
15.0000 mL | OROMUCOSAL | Status: DC | PRN
  Filled 2021-05-17: qty 15

## 2021-05-17 MED ORDER — NITROGLYCERIN 0.4 MG SL SUBL: 0.4000 mg | SUBLINGUAL_TABLET | SUBLINGUAL | Status: DC | PRN

## 2021-05-17 MED ORDER — SODIUM CHLORIDE 0.9 % IV BOLUS
1000.0000 mL | Freq: Once | INTRAVENOUS | Status: AC
  Administered 2021-05-17: 1000 mL via INTRAVENOUS

## 2021-05-17 MED ORDER — SODIUM CHLORIDE 0.9 % IV SOLN
500.0000 mg | Freq: Once | INTRAVENOUS | Status: AC
  Administered 2021-05-17: 500 mg via INTRAVENOUS
  Filled 2021-05-17: qty 500

## 2021-05-17 MED ORDER — SIMVASTATIN 20 MG PO TABS
40.0000 mg | ORAL_TABLET | Freq: Every day | ORAL | Status: DC
  Administered 2021-05-17: 40 mg via ORAL
  Filled 2021-05-17: qty 2

## 2021-05-17 MED ORDER — IOHEXOL 350 MG/ML SOLN: 75.0000 mL | Freq: Once | INTRAVENOUS | Status: DC | PRN

## 2021-05-17 MED ORDER — LEVOTHYROXINE SODIUM 112 MCG PO TABS
112.0000 ug | ORAL_TABLET | Freq: Every day | ORAL | Status: DC
Start: 2021-05-18 — End: 2021-05-18
  Administered 2021-05-18: 112 ug via ORAL
  Filled 2021-05-17: qty 1

## 2021-05-17 MED ORDER — LIDOCAINE-PRILOCAINE 2.5-2.5 % EX CREA: 1.0000 "application " | TOPICAL_CREAM | CUTANEOUS | Status: DC | PRN

## 2021-05-17 MED ORDER — SODIUM CHLORIDE 0.9 % IV SOLN
2.0000 g | INTRAVENOUS | Status: DC
  Administered 2021-05-18: 2 g via INTRAVENOUS
  Filled 2021-05-17: qty 20

## 2021-05-17 MED ORDER — PANTOPRAZOLE SODIUM 40 MG PO TBEC
40.0000 mg | DELAYED_RELEASE_TABLET | Freq: Every day | ORAL | Status: DC
  Administered 2021-05-17 – 2021-05-18 (×2): 40 mg via ORAL
  Filled 2021-05-17 (×2): qty 1

## 2021-05-17 MED ORDER — ALBUTEROL SULFATE (2.5 MG/3ML) 0.083% IN NEBU: 2.5000 mg | INHALATION_SOLUTION | Freq: Four times a day (QID) | RESPIRATORY_TRACT | Status: DC | PRN

## 2021-05-17 MED ORDER — OSMOLITE 1.5 CAL PO LIQD
237.0000 mL | Freq: Four times a day (QID) | ORAL | Status: DC
  Administered 2021-05-17 – 2021-05-18 (×3): 237 mL
  Filled 2021-05-17 (×8): qty 237

## 2021-05-17 MED ORDER — SODIUM CHLORIDE 0.9 % IV SOLN
500.0000 mg | INTRAVENOUS | Status: DC
  Administered 2021-05-18: 500 mg via INTRAVENOUS
  Filled 2021-05-17: qty 500

## 2021-05-17 MED ORDER — ONDANSETRON HCL 4 MG PO TABS: 4.0000 mg | ORAL_TABLET | Freq: Four times a day (QID) | ORAL | Status: DC | PRN

## 2021-05-17 MED ORDER — CALCIUM CARBONATE 1250 (500 CA) MG PO TABS
600.0000 mg | ORAL_TABLET | Freq: Every day | ORAL | Status: DC
  Administered 2021-05-17: 625 mg via ORAL
  Filled 2021-05-17: qty 1

## 2021-05-17 MED ORDER — IOHEXOL 350 MG/ML SOLN
75.0000 mL | Freq: Once | INTRAVENOUS | Status: AC | PRN
  Administered 2021-05-17: 75 mL via INTRAVENOUS

## 2021-05-17 MED ORDER — LACTATED RINGERS IV SOLN: INTRAVENOUS | Status: AC

## 2021-05-17 MED ORDER — IPRATROPIUM BROMIDE 0.02 % IN SOLN: 0.5000 mg | Freq: Four times a day (QID) | RESPIRATORY_TRACT | Status: DC | PRN

## 2021-05-17 MED ORDER — ACETAMINOPHEN 650 MG RE SUPP: 650.0000 mg | Freq: Four times a day (QID) | RECTAL | Status: DC | PRN

## 2021-05-17 MED ORDER — ONDANSETRON HCL 4 MG/2ML IJ SOLN: 4.0000 mg | Freq: Four times a day (QID) | INTRAMUSCULAR | Status: DC | PRN

## 2021-05-17 MED ORDER — GABAPENTIN 300 MG PO CAPS
300.0000 mg | ORAL_CAPSULE | Freq: Three times a day (TID) | ORAL | Status: DC
  Administered 2021-05-17 – 2021-05-18 (×4): 300 mg via ORAL
  Filled 2021-05-17 (×4): qty 1

## 2021-05-17 MED ORDER — ACETAMINOPHEN 325 MG PO TABS
650.0000 mg | ORAL_TABLET | Freq: Four times a day (QID) | ORAL | Status: DC | PRN
  Administered 2021-05-17 – 2021-05-18 (×2): 650 mg via ORAL
  Filled 2021-05-17 (×3): qty 2

## 2021-05-17 MED ORDER — SODIUM CHLORIDE 0.9 % IV SOLN
1.0000 g | Freq: Once | INTRAVENOUS | Status: AC
  Administered 2021-05-17: 1 g via INTRAVENOUS
  Filled 2021-05-17: qty 10

## 2021-05-17 NOTE — H&P (Signed)
History and Physical    Tammy Brady:[id number] DOB: May 10, 1951 DOA: 05/17/2021  PCP: Marge Duncans, PA-C . Patient coming from: Home.  I have personally briefly reviewed patient's old medical records in [place]  Chief Complaint: Generalized weakness and vomiting  HPI: Tammy Brady is a 70 y.o. female with medical history significant of aortic atherosclerosis, nonobstructive CAD, carotid artery disease, aortic atherosclerosis, GERD, hyperlipidemia, hypertension, hypothyroidism, GERD, squamous carcinoma of the oral mucosa who is coming to the emergency department with 2-week history of progressively worse weakness which has Included following that she was unable to walk early in the morning but was already feeling very weak since yesterday.  She also stated at times she has felt confused.  She has had mild dyspnea, chills and subjective fever, but no night sweats, rhinorrhea, sore throat or wheezing.  Denies sputum production.  She has had an occipital headache, has had difficulty sleeping and her appetite is decreased.  She threw up about 6 times last night after going upstairs for a shower.  She had a similar episode 2 weeks ago.  She has been treated with Augmentin for several days and only had 3 days left at the moment.  She denied chest pain, palpitations, diaphoresis, PND, orthopnea or pitting edema of the lower extremities.  No abdominal pain, diarrhea, melena or hematochezia.  She occasionally gets constipated.  No dysuria, frequency or hematuria.  No polyuria, polydipsia, polyphagia or blurred vision.  ED Course: Initial vital signs were temperature 99.7 F, pulse 67, respirations 22, BP 96/51 mmHg O2 sat 95% on room air.  The patient was started on ceftriaxone and Zithromax after receiving at 1000 mL NS bolus.  Lab work: Urinalysis shows small hemoglobinuria but was otherwise normal.  Negative implant plan coronavirus PCR.  CBC showed a white count of 6.0 with 80% neutrophils,  hemoglobin 8.6 g/dL and platelets 230.  Venous gas showed alkalosis with increased PO2.  Sodium 128 and chloride 92 mmol/L.  Total protein was 2.7 g/dL.  The rest of the CMP results are within normal range when calcium corrected to albumin.  Lipase and lactic acid were normal.  Imaging: 2 view chest radiograph shows stable chest x-ray without significant interval change compared to prior imaging.  With chronic bronchitic changes, aortic atherosclerosis and Port-A-Cath in place.  CTA PE protocol was negative for acute PE but continues to showed a wedge-shaped consolidation in the left upper lobe has not resolved despite treatment with amoxicillin/clavulanate.  Please see images and full radiology report for further detail.  Review of Systems: As per HPI otherwise all other systems reviewed and are negative.  Past Medical History:  Diagnosis Date   Aortic atherosclerosis ([place])    CAD (coronary artery disease)    Mild non-obstructive disease by cath January 2011. Coronary CTA showed  mild (25-49%) plaque in the RCA and minimal (<25%) coronary CTA 04/2021 with coronary Ca score 257   Carotid artery disease (HCC)    Followed by Dr Kellie Simmering   GERD (gastroesophageal reflux disease)    Hyperlipidemia    Hypertension    Hypothyroidism    Past Surgical History:  Procedure Laterality Date   APPENDECTOMY     IR IMAGING GUIDED PORT INSERTION  03/02/2021   Social History  reports that she quit smoking about 12 years ago. Her smoking use included cigarettes. She has a 40.00 pack-year smoking history. She has never used smokeless tobacco. She reports that she does not currently use alcohol. She reports  that she does not use drugs.  Allergies  Allergen Reactions   Codeine Other (See Comments)    Headache  HEADACHE   Family History  Problem Relation Age of Onset   Cancer Mother    Coronary artery disease Father    Coronary artery disease Maternal Grandmother    Prior to Admission medications    Medication Sig Start Date End Date Taking? Authorizing Provider  acetaminophen (TYLENOL) 500 MG tablet Take 500 mg by mouth every 6 (six) hours as needed for mild pain.   Yes [provider]  aspirin EC 81 MG tablet Take 1 tablet (81 mg total) by mouth daily. Swallow whole. 04/26/21  Yes Turner, Eber Hong, MD  calcium carbonate (OS-CAL) 600 MG TABS Take 600 mg by mouth daily.   Yes [provider]  diltiazem (CARDIZEM LA) 360 MG 24 hr tablet Take 1 tablet (360 mg total) by mouth daily. 04/14/21  Yes Marge Duncans, PA-C  gabapentin (NEURONTIN) 300 MG capsule Take 300 mg by mouth 3 (three) times daily. 03/05/21  Yes [provider]  levothyroxine (SYNTHROID) 112 MCG tablet TAKE 1 TABLET BY MOUTH  DAILY Patient taking differently: Take 112 mcg by mouth daily. 04/07/21  Yes Marge Duncans, PA-C  lidocaine (XYLOCAINE) 2 % solution Patient: Mix 1part 2% viscous lidocaine, 1part H20. Swish & swallow 43mL of diluted mixture, 55min before meals and at bedtime, up to QID 03/22/21  Yes Eppie Gibson, MD  lidocaine-prilocaine (EMLA) cream Apply to affected area once Patient taking differently: Apply 1 application topically as needed (port site access). 02/25/21  Yes Benay Pike, MD  Morphine Sulfate (MORPHINE CONCENTRATE) 10 mg / 0.5 ml concentrated solution Take 0.25-0.5 mLs (5-10 mg total) by mouth every 6 (six) hours as needed for severe pain. 04/16/21  Yes Truitt Merle, MD  nitroGLYCERIN (NITROSTAT) 0.4 MG SL tablet Place 1 tablet (0.4 mg total) under the tongue every 5 (five) minutes as needed. Patient taking differently: Place 0.4 mg under the tongue every 5 (five) minutes as needed for chest pain. 06/20/16  Yes Burnell Blanks, MD  Nutritional Supplements (FEEDING SUPPLEMENT, OSMOLITE 1.5 CAL,) LIQD Place 360 mLs into feeding tube in the morning, at noon, in the evening, and at bedtime.   Yes [provider]  Polyethylene Glycol 3350 (MIRALAX PO) Take 17 g by mouth daily  as needed (constipation).   Yes [provider]  simvastatin (ZOCOR) 40 MG tablet TAKE 1 TABLET BY MOUTH AT  BEDTIME Patient taking differently: Take 40 mg by mouth at bedtime. 04/07/21  Yes Marge Duncans, PA-C  traZODone (DESYREL) 50 MG tablet Take 1 tablet (50 mg total) by mouth at bedtime. 04/20/21  Yes Marge Duncans, PA-C  dexamethasone (DECADRON) 4 MG tablet Take 2 tablets (8 mg total) by mouth daily. Take daily x 3 days starting the day after cisplatin chemotherapy. Take with food. Patient not taking: No sig reported 02/25/21   Benay Pike, MD  omeprazole (PRILOSEC) 20 MG capsule Take 1 capsule (20 mg total) by mouth daily. Patient not taking: Reported on 05/17/2021 03/29/21   Eppie Gibson, MD  ondansetron (ZOFRAN) 8 MG tablet Take 1 tablet (8 mg total) by mouth 2 (two) times daily as needed. Start on the third day after cisplatin chemotherapy. Patient not taking: Reported on 05/17/2021 02/25/21   Benay Pike, MD  prochlorperazine (COMPAZINE) 10 MG tablet Take 1 tablet (10 mg total) by mouth every 6 (six) hours as needed (Nausea or vomiting). Patient not taking: Reported on  05/17/2021 02/25/21   Benay Pike, MD    Physical Exam: Vitals:   05/17/21 0423 05/17/21 0545  BP: (!) 96/51 (!) 103/49  Pulse: 67 64  Resp: (!) 22 17  Temp: 99.7 F (37.6 C)   TempSrc: Oral   SpO2: 95% 97%    Constitutional: Chronically ill-appearing.  NAD, calm, comfortable Eyes: PERRL, lids and conjunctivae normal ENMT: Mucous membranes are mildly dry.  There is tongue elevation and macroglossia. Neck: normal, supple, no masses, no thyromegaly Respiratory: clear to auscultation bilaterally, no wheezing, no crackles. Normal respiratory effort. No accessory muscle use.  Cardiovascular: Regular rate and rhythm, no murmurs / rubs / gallops. No extremity edema. 2+ pedal pulses. No carotid bruits.  Abdomen: PEG tube in place.  Bowel sounds positive.No distention.  Soft, no tenderness, no masses  palpated. No hepatosplenomegaly.   Musculoskeletal: Moderate generalized weakness.  No clubbing / cyanosis.  Good ROM, no contractures. Normal muscle tone.  Skin: no rashes, lesions, ulcers on very limited otological examination. Neurologic: CN 2-12 grossly intact. Sensation intact, DTR normal. Strength 5/5 in all 4.  Psychiatric: Normal judgment and insight. Alert and oriented x 3. Normal mood.   Labs on Admission: I have personally reviewed following labs and imaging studies  CBC: Recent Labs  Lab 05/17/21 0520  WBC 6.0  NEUTROABS 4.8  HGB 8.6*  HCT 26.6*  MCV 88.1  PLT 831    Basic Metabolic Panel: Recent Labs  Lab 05/17/21 0520  NA 128*  K 4.1  CL 92*  CO2 25  GLUCOSE 127*  BUN 23  CREATININE 0.75  CALCIUM 8.6*    GFR: Estimated Creatinine Clearance: 68.9 mL/min (by C-G formula based on SCr of 0.75 mg/dL).  Liver Function Tests: Recent Labs  Lab 05/17/21 0520  AST 26  ALT 14  ALKPHOS 71  BILITOT 0.4  PROT 6.9  ALBUMIN 2.7*    Urine analysis:    Component Value Date/Time   COLORURINE YELLOW 05/17/2021 0736   APPEARANCEUR CLEAR 05/17/2021 0736   LABSPEC 1.013 05/17/2021 0736   PHURINE 7.0 05/17/2021 0736   GLUCOSEU NEGATIVE 05/17/2021 0736   HGBUR SMALL (A) 05/17/2021 0736   BILIRUBINUR NEGATIVE 05/17/2021 0736   KETONESUR NEGATIVE 05/17/2021 0736   PROTEINUR NEGATIVE 05/17/2021 0736   NITRITE NEGATIVE 05/17/2021 0736   LEUKOCYTESUR NEGATIVE 05/17/2021 0736    Radiological Exams on Admission: DG Chest 2 View  Result Date: 05/17/2021 CLINICAL DATA:  Shortness of breath, weakness EXAM: CHEST - 2 VIEW COMPARISON:  Prior chest x-ray 05/03/2021 FINDINGS: Right IJ approach single-lumen power injectable port catheter. Catheter tip overlies the upper SVC. Stable cardiac and mediastinal contours. Atherosclerotic calcifications present in the transverse aorta. Chronic bronchitic changes and left mid lung patchy airspace opacity are stable compared to prior.  No new airspace opacity, pulmonary edema, pleural effusion or pneumothorax. No acute osseous abnormality. Atherosclerotic calcifications present in the transverse aorta. IMPRESSION: 1. Stable chest x-ray without significant interval change compared to prior imaging. 2. Chronic bronchitic changes, aortic atherosclerosis, portacatheter in place with the tip overlying the upper SVC. Electronically Signed   By: Jacqulynn Cadet M.D.   On: 05/17/2021 06:01   CT Angio Chest PE W and/or Wo Contrast  Result Date: 05/17/2021 CLINICAL DATA:  70 year old female with weakness. Reportedly undergoing treatment for oral cancer. EXAM: CT ANGIOGRAPHY CHEST WITH CONTRAST TECHNIQUE: Multidetector CT imaging of the chest was performed using the standard protocol during bolus administration of intravenous contrast. Multiplanar CT image reconstructions and MIPs were obtained  to evaluate the vascular anatomy. CONTRAST:  57mL OMNIPAQUE IOHEXOL 350 MG/ML SOLN COMPARISON:  Chest radiographs 0539 hours today. Chest radiographs 05/03/2021. CTA chest 11/21/2007. Outside [facility] CT neck 12/14/2020. FINDINGS: Cardiovascular: Good contrast bolus timing in the pulmonary arterial tree. No focal filling defect identified in the pulmonary arteries to suggest acute pulmonary embolism. Calcified aortic atherosclerosis. No cardiomegaly or pericardial effusion. Calcified coronary artery atherosclerosis. Right chest Port-A-Cath. Mediastinum/Nodes: No mediastinal lymphadenopathy. Lungs/Pleura: Volume loss in the left hemithorax since 2009. Wedge-shaped consolidation in the left upper lobe extending from the hilum to the pleural surface on series 6, image 48. This is not enhancing. Minimal air bronchograms. This is unresolved since the radiographs 05/03/2021. Superimposed centrilobular emphysema. Major airways are patent. Additional dependent atelectasis in both lungs. No pleural effusion. Upper Abdomen: Negative visible liver, spleen, gallbladder,  pancreas, adrenal glands and kidneys. Percutaneous gastrostomy tube with no adverse features. Musculoskeletal: No acute or suspicious osseous lesion identified. Review of the MIP images confirms the above findings. IMPRESSION: 1. Negative for acute pulmonary embolus. 2. Wedge-shaped consolidation in the left upper lobe, unresolved since radiographs on 05/03/2021, but seems to be new from outside Neck CT 12/14/2020. Favor unresolved pneumonia. Bronchogenic carcinoma or confluent pulmonary scarring less likely. Underlying Emphysema (ICD10-J43.9). Recommend CT Chest surveillance in conjunction with cancer imaging. 3. Calcified coronary artery and Aortic Atherosclerosis (ICD10-I70.0). Electronically Signed   By: Genevie Ann M.D.   On: 05/17/2021 07:18    EKG: Independently reviewed. Vent. rate 66 BPM PR interval 144 ms QRS duration 79 ms QT/QTcB 419/439 ms P-R-T axes 56 81 72 Sinus rhythm Borderline right axis deviation  Assessment/Plan Principal Problem:   CAP (community acquired pneumonia) Admit to telemetry/inpatient. Supplemental oxygen as needed. Bronchodilators as needed. Continue ceftriaxone and Zithromax. Check sputum gram stain, culture and sensitivity. Check strep pneumoniae urinary antigen. Hyponatremic, check Legionella urinary antigen. Follow-up blood culture and sensitivity.  Active Problems:   Hyponatremia Likely from vomiting. Continue IV fluids. Follow-up sodium level.    Hypothyroidism Continue Synthroid 112 mcg p.o. daily.    Mixed hyperlipidemia Continue simvastatin 40 mg p.o. daily.    Hypertension, essential Cardizem LA 360 mg every 24 hours. Monitor BP and heart rate.    CAD, NATIVE VESSEL   History of obstruction of carotid artery Continue aspirin and simvastatin.    Depression Continue trazodone 50 mg p.o. bedtime. If still having insomnia, consider increasing to 75 to 100 mg.    GERD (gastroesophageal reflux disease) Resume PPI.    Squamous cell  carcinoma of oral mucosa ([place]) Continue treatment and follow-up per oncology.    Normocytic anemia Has had 3+ g drop in the last month and a half. Check stool occult blood and follow H&H. Transfuse as needed.    Moderate protein-calorie malnutrition (HCC) Continue Osmolite 1.5. Consult nutritional services.    DVT prophylaxis: SCDs. Code Status:   Full code. Family Communication:   Disposition Plan:   Patient is from:  Home.  Anticipated DC to:  Home.  Anticipated DC date:  05/19/2021 or 05/20/2021.  Anticipated DC barriers: Clinical status.  Consults called:  Nutritional services. Admission status:  Inpatient/telemetry.   Severity of Illness: High severity after presenting to the emergency department with progressively worse weakness, multiple episodes of emesis in the setting of community-acquired pneumonia that failed outpatient amoxicillin/clavulanate treatment.  The patient will be admitted for IV antibiotic therapy for 2 to 3 days.  Reubin Milan MD [facility]  How to contact the [facility] Attending or Consulting  provider [place] or covering provider during after hours [place], for this patient?   Check the care team in [facility] and look for a) attending/consulting [facility] provider listed and b) the [facility] team listed Log into [email or web address] and use [place]'s universal password to access. If you do not have the password, please contact the hospital operator. Locate the [facility] provider you are looking for under [facility] and page to a number that you can be directly reached. If you still have difficulty reaching the provider, please page the [facility] (Director on Call) for the Hospitalists listed on amion for assistance.  05/17/2021, 11:09 AM   This document was prepared with Dragon voice recognition software and may contain some unintended transcription errors.

## 2021-05-17 NOTE — ED Triage Notes (Signed)
Pt BIB EMS. Pt reports weakness and unable to walk since last night.

## 2021-05-17 NOTE — ED Provider Notes (Signed)
  Care of patient assumed from [facility] at [place].  Agree with history, physical exam and plan.  See their note for further details. Briefly, 70 year old female with history of squamous cell carcinoma status postchemotherapy and radiation, CAD, hypertension.  Presents emergency department with chief complaint of weakness.  Patient had 6 episodes of emesis.  Recent fever.  Patient has been treated for possible pneumonia, has been taking Augmentin with 3 days left of medication course.  Upon her presentation patient was tachypneic and hypoxic.    Physical Exam  BP (!) 103/49   Pulse 64   Temp 99.7 F (37.6 C) (Oral)   Resp 17   SpO2 97%   Physical Exam Vitals and nursing note reviewed.  Constitutional:      General: She is not in acute distress.    Appearance: She is not ill-appearing, toxic-appearing or diaphoretic.  HENT:     Head: Normocephalic.  Eyes:     General: No scleral icterus.       Right eye: No discharge.        Left eye: No discharge.  Cardiovascular:     Rate and Rhythm: Normal rate.  Pulmonary:     Effort: Pulmonary effort is normal. No tachypnea, bradypnea or respiratory distress.     Breath sounds: Normal breath sounds. No stridor. No wheezing, rhonchi or rales.  Musculoskeletal:     Right lower leg: Normal.     Left lower leg: Normal.  Skin:    General: Skin is warm and dry.  Neurological:     General: No focal deficit present.     Mental Status: She is alert.  Psychiatric:        Behavior: Behavior is cooperative.    ED Course/Procedures   Clinical Course as of 05/17/21 0946  Mon May 17, 2021  0827 Spoke to Dr. Olevia Bowens who will see the patient for admission. [PB]    Clinical Course User Index [PB] Loni Beckwith, PA-C    Procedures  MDM   Patient reports that she finished chemotherapy and radiation October 2022.  At time of handoff CTA pending.  Patient has no signs of acute PE.  WhatsApp consolidation left upper lobe unresolved since  10/24 but seems to be new from outside neck CT.  Favor unresolved pneumonia.  Patient has been taking Augmentin in outpatient setting for pneumonia.  Patient has not completed course of this medication.  We will start patient on ceftriaxone and azithromycin.  We will consult hospitalist to admit for acute hypoxia secondary to pneumonia.  Spoke to Dr. Olevia Bowens who will see the patient for admission.      Loni Beckwith, PA-C 05/17/21 0946    Fatima Blank, MD 05/18/21 [phone]

## 2021-05-17 NOTE — Plan of Care (Signed)

## 2021-05-17 NOTE — ED Provider Notes (Signed)
[facility] DEPT Provider Note   CSN: [id number] Arrival date & time: 05/17/21  7425     History Chief Complaint  Patient presents with   Weakness    Tammy Brady is a 70 y.o. female.  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  Patient states yesterday she felt weak in her legs bilaterally, states that she felt as if she would not be able to hold herself up.  She states that earlier in the day she was feeling okay, she had an episode of vomiting where she states she vomited about 6 times, she denies hematemesis or coffee-ground emesis, states that she felt better.   She denies any paresthesias or unilateral weakness, she denies  head trauma, is not on anticoagulant, denies change in vision, lightheaded or dizziness.  She denies having  stomach pain, constipation, diarrhea, she denies nasal congestion, sore throat, cough, general body aches.  She does state that she has been having fever for last couple days, she is being treated for possible pneumonia has been taking Augmentin she has approximately 3 days left.  She is no other complaints at this time.  Husband was at bedside was able to validate the story, he endorses that she was weak after after a nap, he denies unilateral weakness, one-sided neglect, patient seems off balance, have difficulty word finding.   Past Medical History:  Diagnosis Date   Aortic atherosclerosis ([place])    CAD (coronary artery disease)    Mild non-obstructive disease by cath January 2011. Coronary CTA showed  mild (25-49%) plaque in the RCA and minimal (<25%) coronary CTA 04/2021 with coronary Ca score 257   Carotid artery disease ([place])    Followed by Dr Kellie Simmering   GERD (gastroesophageal reflux disease)    Hyperlipidemia    Hypertension    Hypothyroidism      Patient Active Problem List   Diagnosis Date Noted   G tube feedings ([place]) 04/29/2021   Aortic atherosclerosis ([place]) 04/25/2021   Chest pain of uncertain etiology 95/63/8756   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 chemotherapy ([place]) 04/07/2021   Mucositis due to antineoplastic therapy 03/31/2021   Drug induced constipation 03/31/2021   Chemotherapy-induced nausea 03/31/2021   Port-A-Cath in place 03/09/2021   Carcinoma of lower gum ([place]) 02/20/2021   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[place]) 12/08/2020   Gastritis 12/08/2020   Anxiety 12/08/2020   Need for prophylactic vaccination and inoculation against influenza 07/30/2020   Depression 11/26/2015   GERD (gastroesophageal reflux disease) 11/26/2015   Migraine headache 11/26/2015   Screening for diabetes mellitus 09/03/2015   Hypothyroidism 07/22/2009   Mixed hyperlipidemia 07/22/2009   Tobacco user 07/22/2009   Hypertension, essential 07/22/2009   CAD, NATIVE VESSEL 07/22/2009   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   Coronary artery spasm ([place]) 07/22/2009    Past Surgical History:  Procedure Laterality Date   APPENDECTOMY     IR IMAGING GUIDED PORT INSERTION  03/02/2021     OB History   No obstetric history on file.     Family History  Problem Relation Age of Onset   Cancer Mother    Coronary artery disease Father    Coronary artery disease Maternal Grandmother     Social History   Tobacco Use   Smoking status: Former    Packs/day: 1.00    Years: 40.00  Pack years: 40.00    Types: Cigarettes    Quit date: 05/11/2009    Years since quitting: 12.0   Smokeless tobacco: Never  Substance Use Topics   Alcohol use: Not Currently   Drug use: No    Home Medications Prior to Admission medications   Medication Sig Start Date End Date Taking? Authorizing Provider  acetaminophen (TYLENOL) 500 MG tablet Take 500 mg by mouth every 6 (six) hours as needed.    [provider]  aspirin EC 81 MG tablet  Take 1 tablet (81 mg total) by mouth daily. Swallow whole. 04/26/21   Sueanne Margarita, MD  calcium carbonate (OS-CAL) 600 MG TABS Take 600 mg by mouth daily.    [provider]  dexamethasone (DECADRON) 4 MG tablet Take 2 tablets (8 mg total) by mouth daily. Take daily x 3 days starting the day after cisplatin chemotherapy. Take with food. 02/25/21   Benay Pike, MD  diltiazem (CARDIZEM LA) 360 MG 24 hr tablet Take 1 tablet (360 mg total) by mouth daily. 04/14/21   Marge Duncans, PA-C  gabapentin (NEURONTIN) 300 MG capsule Take 1 capsule by mouth 3 (three) times daily. 03/05/21   [provider]  levothyroxine (SYNTHROID) 112 MCG tablet TAKE 1 TABLET BY MOUTH  DAILY 04/07/21   Marge Duncans, PA-C  lidocaine (XYLOCAINE) 2 % solution Patient: Mix 1part 2% viscous lidocaine, 1part H20. Swish & swallow 8mL of diluted mixture, 6min before meals and at bedtime, up to QID 03/22/21   Eppie Gibson, MD  lidocaine-prilocaine (EMLA) cream Apply to affected area once 02/25/21   Benay Pike, MD  Morphine Sulfate (MORPHINE CONCENTRATE) 10 mg / 0.5 ml concentrated solution Take 0.25-0.5 mLs (5-10 mg total) by mouth every 6 (six) hours as needed for severe pain. 04/16/21   Truitt Merle, MD  Multiple Vitamin (MULTIVITAMIN) tablet Take 1 tablet by mouth daily.    [provider]  nitroGLYCERIN (NITROSTAT) 0.4 MG SL tablet Place 1 tablet (0.4 mg total) under the tongue every 5 (five) minutes as needed. 06/20/16   Burnell Blanks, MD  Nutritional Supplements (FEEDING SUPPLEMENT, OSMOLITE 1.5 CAL,) LIQD Place into feeding tube daily. 4-5 cartons daily    [provider]  omeprazole (PRILOSEC) 20 MG capsule Take 1 capsule (20 mg total) by mouth daily. 03/29/21   Eppie Gibson, MD  ondansetron (ZOFRAN) 8 MG tablet Take 1 tablet (8 mg total) by mouth 2 (two) times daily as needed. Start on the third day after cisplatin chemotherapy. 02/25/21   Benay Pike, MD  Polyethylene Glycol 3350  (MIRALAX PO) Take by mouth as needed.    [provider]  prochlorperazine (COMPAZINE) 10 MG tablet Take 1 tablet (10 mg total) by mouth every 6 (six) hours as needed (Nausea or vomiting). 02/25/21   Benay Pike, MD  simvastatin (ZOCOR) 40 MG tablet TAKE 1 TABLET BY MOUTH AT  BEDTIME 04/07/21   Marge Duncans, PA-C  traZODone (DESYREL) 50 MG tablet Take 1 tablet (50 mg total) by mouth at bedtime. 04/20/21   Marge Duncans, PA-C    Allergies    Codeine  Review of Systems   Review of Systems  Constitutional:  Negative for chills and fever.  HENT:  Negative for congestion.   Respiratory:  Negative for shortness of breath.   Cardiovascular:  Negative for chest pain.  Gastrointestinal:  Positive for vomiting. Negative for abdominal pain, diarrhea and nausea.  Genitourinary:  Negative for enuresis.  Musculoskeletal:  Negative for back pain.  Skin:  Negative for rash.  Neurological:  Positive for weakness. Negative for dizziness.  Hematological:  Does not bruise/bleed easily.   Physical Exam Updated Vital Signs BP (!) 103/49   Pulse 64   Temp 99.7 F (37.6 C) (Oral)   Resp 17   SpO2 97%   Physical Exam Vitals and nursing note reviewed.  Constitutional:      General: She is not in acute distress.    Appearance: She is not ill-appearing.  HENT:     Head: Normocephalic and atraumatic.     Nose: No congestion.     Mouth/Throat:     Mouth: Mucous membranes are moist.     Comments: Patient has noted tongue elevation and enlargement of the tongue  but this appears to be secondary due to radiation therapy. Eyes:     Extraocular Movements: Extraocular movements intact.     Conjunctiva/sclera: Conjunctivae normal.     Pupils: Pupils are equal, round, and reactive to light.  Cardiovascular:     Rate and Rhythm: Normal rate and regular rhythm.     Pulses: Normal pulses.     Heart sounds: No murmur heard.   No friction rub. No gallop.  Pulmonary:     Effort: No respiratory  distress.     Breath sounds: No wheezing, rhonchi or rales.     Comments: Patient not appear to be in acute respiratory distress, able to talk in complete sentences, she is not tachypneic but was noted to be hypoxic, O2 sats in the mid 80s.  Lung sounds were clear bilaterally, slight bibasilar rales heard worse than left versus the right, no wheezing, rhonchi or stridor present.  Patient is placed on 2 L via nasal cannula stabilized O2 sats have stabilized at 96%. Abdominal:     Palpations: Abdomen is soft.     Tenderness: There is no abdominal tenderness. There is no right CVA tenderness or left CVA tenderness.  Musculoskeletal:     Right lower leg: Edema present.     Left lower leg: Edema present.     Comments: Able to move all 4 extremities, 5 of 5 strength, neurovascular intact in the upper lower extremities.  Slight bilateral pedal edema  Skin:    General: Skin is warm and dry.  Neurological:     Mental Status: She is alert.     GCS: GCS eye subscore is 4. GCS verbal subscore is 5. GCS motor subscore is 6.     Cranial Nerves: No cranial nerve deficit.     Sensory: Sensation is intact.     Motor: No weakness.     Coordination: Romberg sign negative. Finger-Nose-Finger Test normal.     Comments: Cranial nerves II through XII grossly intact, no difficult word finding, no slurring of her words, able to follow two-step commands, no unilateral weakness present.  Psychiatric:        Mood and Affect: Mood normal.    ED Results / Procedures / Treatments   Labs (all labs ordered are listed, but only abnormal results are displayed) Labs Reviewed  COMPREHENSIVE METABOLIC PANEL - Abnormal; Notable for the following components:      Result Value   Sodium 128 (*)    Chloride 92 (*)    Glucose, Bld 127 (*)    Calcium 8.6 (*)    Albumin 2.7 (*)    All other components within normal limits  CBC WITH DIFFERENTIAL/PLATELET - Abnormal; Notable for the following components:   RBC 3.02 (*)  Hemoglobin 8.6 (*)    HCT 26.6 (*)    RDW 20.5 (*)    nRBC 0.3 (*)    Lymphs Abs 0.3 (*)    All other components within normal limits  BLOOD GAS, VENOUS - Abnormal; Notable for the following components:   pH, Ven 7.501 (*)    pCO2, Ven 32.9 (*)    pO2, Ven 144.0 (*)    Acid-Base Excess 2.7 (*)    All other components within normal limits  CBG MONITORING, ED - Abnormal; Notable for the following components:   Glucose-Capillary 132 (*)    All other components within normal limits  RESP PANEL BY RT-PCR (FLU A&B, COVID) ARPGX2  CULTURE, BLOOD (ROUTINE X 2)  CULTURE, BLOOD (ROUTINE X 2)  LIPASE, BLOOD  LACTIC ACID, PLASMA  LACTIC ACID, PLASMA  I-STAT CHEM 8, ED    EKG EKG Interpretation  Date/Time:  Monday May 17 2021 05:15:14 EST Ventricular Rate:  66 PR Interval:  144 QRS Duration: 79 QT Interval:  419 QTC Calculation: 439 R Axis:   81 Text Interpretation: Sinus rhythm Borderline right axis deviation No acute changes Confirmed by Addison Lank [phone]) on 05/17/2021 5:26:20 AM  Radiology DG Chest 2 View  Result Date: 05/17/2021 CLINICAL DATA:  Shortness of breath, weakness EXAM: CHEST - 2 VIEW COMPARISON:  Prior chest x-ray 05/03/2021 FINDINGS: Right IJ approach single-lumen power injectable port catheter. Catheter tip overlies the upper SVC. Stable cardiac and mediastinal contours. Atherosclerotic calcifications present in the transverse aorta. Chronic bronchitic changes and left mid lung patchy airspace opacity are stable compared to prior. No new airspace opacity, pulmonary edema, pleural effusion or pneumothorax. No acute osseous abnormality. Atherosclerotic calcifications present in the transverse aorta. IMPRESSION: 1. Stable chest x-ray without significant interval change compared to prior imaging. 2. Chronic bronchitic changes, aortic atherosclerosis, portacatheter in place with the tip overlying the upper SVC. Electronically Signed   By: Jacqulynn Cadet M.D.   On:  05/17/2021 06:01    Procedures .Critical Care E&M Performed by: Marcello Fennel, PA-C  Critical care provider statement:    Critical care time (minutes):  30   Critical care time was exclusive of:  Separately billable procedures and treating other patients   Critical care was necessary to treat or prevent imminent or life-threatening deterioration of the following conditions:  Respiratory failure   Critical care was time spent personally by me on the following activities:  Ordering and performing treatments and interventions, ordering and review of laboratory studies, ordering and review of radiographic studies, pulse oximetry, re-evaluation of patient's condition, review of old charts, examination of patient and evaluation of patient's response to treatment   I assumed direction of critical care for this patient from another provider in my specialty: no   After initial E/M assessment, critical care services were subsequently performed that were exclusive of separately billable procedures or treatment.     Medications Ordered in ED Medications  iohexol (OMNIPAQUE) 350 MG/ML injection 75 mL (has no administration in time range)  sodium chloride 0.9 % bolus 1,000 mL (1,000 mLs Intravenous New Bag/Given 05/17/21 5784)    ED Course  I have reviewed the triage vital signs and the nursing notes.  Pertinent labs & imaging results that were available during my care of the patient were reviewed by me and considered in my medical decision making (see chart for details).    MDM Rules/Calculators/A&P  Initial impression-presents with weakness.  She is alert, does not appear in acute stress, vital signs are for tachypnea and hypoxia.  I am concerned for possible PE due to her history of metastatic disease.  Will obtain basic lab work-up, provide patient with oxygen, and reassess.  Work-up-CBC shows normocytic anemia hemoglobin 8.6 appears to be at baseline for patient,  CMP CMP shows sodium of 128, chloride 92, glucose 127, calcium 8.6, albumin 2.7, lipase 21, lactic 1.3, venous blood gas shows pH of 7.5, PCO2 of 32, PO2 144.  EKG sinus without signs of ischemia.  Chest x-ray stable chest x-ray without significant interval changes and compared to prior imaging chronic bronchiolitic changes.  Reassessment-patient  reassessed after providing her with oxygen, O2 sats have now stabilized, she has no other complaints at this time.  There is no signs of acute kidney injury, chest x-ray is unremarkable will proceed with CT of chest for rule out of PE.  Will defer antibiotic treatment at this time as she is afebrile, nontachycardic, no leukocytosis, lactic is normal.  Rule out-low suspicion for systemic infection as patient is nontoxic-appearing, vital signs are reassuring, no leukocytosis present, lactic is unremarkable.  I have low suspicion for ACS as patient has chest pain, shortness of breath, EKG without signs of ischemia.  Low suspicion for CVA or intracranial head bleed patient denies recent falls, is not on anticoagulant, denies paresthesias or unilateral weakness, there is no focal deficits present on exam, no local weakness present.  I have low suspicion for CHF exacerbation as there is no signs of volume overload prior to my exam, chest x-ray is unremarkable, no rales present.  Plan-   due to shift change patient will be handed off to [facility] the was provide HPI, current work-up, likely disposition  Follow-up on CTA of chest, likely PE versus pneumonia, treat accordingly, recommend admission for further work-up of acute hypoxia.      Final Clinical Impression(s) / ED Diagnoses Final diagnoses:  Weakness  Hypoxia    Rx / DC Orders ED Discharge Orders     None        Marcello Fennel, PA-C 05/17/21 [place], MD 05/17/21 [phone]

## 2021-05-18 ENCOUNTER — Encounter: Payer: Self-pay | Admitting: Physical Therapy

## 2021-05-18 DIAGNOSIS — D649 Anemia, unspecified: Secondary | ICD-10-CM

## 2021-05-18 DIAGNOSIS — J189 Pneumonia, unspecified organism: Secondary | ICD-10-CM | POA: Diagnosis not present

## 2021-05-18 LAB — CBC
HCT: 23.6 % — ABNORMAL LOW (ref 36.0–46.0)
Hemoglobin: 7.6 g/dL — ABNORMAL LOW (ref 12.0–15.0)
MCH: 28.9 pg (ref 26.0–34.0)
MCHC: 32.2 g/dL (ref 30.0–36.0)
MCV: 89.7 fL (ref 80.0–100.0)
Platelets: 203 10*3/uL (ref 150–400)
RBC: 2.63 MIL/uL — ABNORMAL LOW (ref 3.87–5.11)
RDW: 20.7 % — ABNORMAL HIGH (ref 11.5–15.5)
WBC: 4.7 10*3/uL (ref 4.0–10.5)
nRBC: 0.4 % — ABNORMAL HIGH (ref 0.0–0.2)

## 2021-05-18 LAB — HIV ANTIBODY (ROUTINE TESTING W REFLEX): HIV Screen 4th Generation wRfx: NONREACTIVE

## 2021-05-18 LAB — ABO/RH: ABO/RH(D): A POS

## 2021-05-18 LAB — PREPARE RBC (CROSSMATCH)

## 2021-05-18 LAB — HEMOGLOBIN AND HEMATOCRIT, BLOOD
HCT: 29.7 % — ABNORMAL LOW (ref 36.0–46.0)
Hemoglobin: 9.4 g/dL — ABNORMAL LOW (ref 12.0–15.0)

## 2021-05-18 MED ORDER — SODIUM CHLORIDE 0.9% IV SOLUTION: Freq: Once | INTRAVENOUS | Status: AC

## 2021-05-18 MED ORDER — HEPARIN SOD (PORK) LOCK FLUSH 100 UNIT/ML IV SOLN
500.0000 [IU] | Freq: Once | INTRAVENOUS | Status: AC
  Administered 2021-05-18: 500 [IU] via INTRAVENOUS
  Filled 2021-05-18: qty 5

## 2021-05-18 MED ORDER — AMOXICILLIN 400 MG/5ML PO SUSR: 875.0000 mg | Freq: Two times a day (BID) | ORAL | 0 refills | Status: AC

## 2021-05-18 MED ORDER — AZITHROMYCIN 200 MG/5ML PO SUSR: 250.0000 mg | Freq: Every day | ORAL | 0 refills | Status: AC

## 2021-05-18 MED ORDER — OSMOLITE 1.2 CAL PO LIQD: 1000.0000 mL | ORAL | Status: DC

## 2021-05-18 MED ORDER — AZITHROMYCIN 250 MG PO TABS: 500.0000 mg | ORAL_TABLET | Freq: Every day | ORAL | Status: DC

## 2021-05-18 MED ORDER — OSMOLITE 1.5 CAL PO LIQD
355.0000 mL | Freq: Four times a day (QID) | ORAL | Status: DC
  Administered 2021-05-18: 355 mL
  Filled 2021-05-18 (×2): qty 474

## 2021-05-18 MED ORDER — DOXYCYCLINE CALCIUM 50 MG/5ML PO SYRP: 100.0000 mg | ORAL_SOLUTION | Freq: Two times a day (BID) | ORAL | 0 refills | Status: DC

## 2021-05-18 NOTE — Hospital Course (Addendum)
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 There was also some reported mild dyspnea, chills, and fever on admission however no cough or sputum production. She underwent work-up in the ER and was found to be anemic, hemoglobin 8.6 g/dL which further downtrended to 7.6 g/dL the the morning following admission.  She continues on cisplatin treatment outpatient for her cancer treatment. She was evaluated with CXR and CT angio chest as well.  This was negative for PE but showed wedge-shaped consolidation in the left upper lobe unchanged from prior imaging on 05/03/2021 but new since June 2022. She was admitted and started on antibiotics for pneumonia treatment.  Her weakness was considered to be multifactorial more from probable symptomatic anemia with some possible contribution from underlying pneumonia.  She was amenable with transfusion of 1 unit PRBC while in hospital and continuing antibiotics at discharge. She was comfortable and also amenable with discharging home after her blood transfusion.  Hemoglobin after transfusion improved to 9.4 g/dL.  She was continued on amoxicillin and azithromycin at discharge to complete course as well.

## 2021-05-18 NOTE — Care Management Obs Status (Signed)
[facility] NOTIFICATION   Patient Details  Name: Tammy Brady MRN: [id number] Date of Birth: May 17, 1951   Medicare Observation Status Notification Given:  Yes    Lynnell Catalan, RN 05/18/2021, 3:57 PM

## 2021-05-18 NOTE — Progress Notes (Signed)
Initial Nutrition Assessment  INTERVENTION:   -355 ml Osmolite 1.5 QID via PEG  -90 ml free water flushes before and after each feed -Provides 2130 kcals, 89g protein and 1806 ml H2O  -Additional free water recommendations while NPO: 240 ml BID via PEG   NUTRITION DIAGNOSIS:   Increased nutrient needs related to cancer and cancer related treatments as evidenced by estimated needs.  GOAL:   Patient will meet greater than or equal to 90% of their needs  MONITOR:   Labs, Weight trends, I & O's, TF tolerance  REASON FOR ASSESSMENT:   Consult Enteral/tube feeding initiation and management  ASSESSMENT:   70 y.o. female with medical history significant of aortic atherosclerosis, nonobstructive CAD, carotid artery disease, aortic atherosclerosis, GERD, hyperlipidemia, hypertension, hypothyroidism, GERD, squamous carcinoma of the oral mucosa who is coming to the emergency department with 2-week history of progressively worse weakness which has Included following that she was unable to walk early in the morning but was already feeling very weak since yesterday.  She also stated at times she has felt confused.  She has had mild dyspnea, chills and subjective fever.  Patient reports tolerating her tube feedings with no issue. Followed by [facility], last seen 10/26. Pt states she was having some vomiting PTA but is feeling fine now. Her tube feedings were resumed yesterday. Today she is receiving her normal dose of 1.5 cartons (362ml) at each feeding. She administers this QID for 6 total cartons of Osmolite 1.5. Pt states at home she doesn't perform gravity but slower pushes via syringe. Has had some fast administrations here which have caused some nausea and discomfort.  Pt with swollen tongue but able to communicate. States her neck is a bit more swollen since admission, it was better at home. Currently NPO. Reports being able to swallow small sips of clears at home.  UBW is 175  lbs. No weight was recorded for this admission. Daily weights have been ordered via TF protocol.  Medications: OSCAL  Labs reviewed:  Low Na  NUTRITION - FOCUSED PHYSICAL EXAM:  No depletions noted.  Flowsheet Row Most Recent Value  Hair Reviewed  Eyes Reviewed  Mouth Reviewed  [swollen tongue]  Skin Reviewed       Diet Order:   Diet Order     None       EDUCATION NEEDS:   No education needs have been identified at this time  Skin:  Skin Assessment: Reviewed RN Assessment  Last BM:  11/7  Height:   Ht Readings from Last 1 Encounters:  05/05/21 5\' 6"  (1.676 m)    Weight:   Wt Readings from Last 1 Encounters:  05/07/21 73.1 kg    BMI:  There is no height or weight on file to calculate BMI.  Estimated Nutritional Needs:   Kcal:  2200-2400  Protein:  110-130g  Fluid:  2.2L/day  Tammy Bibles, MS, RD, LDN Inpatient Clinical Dietitian Contact information available via Amion

## 2021-05-18 NOTE — Care Management CC44 (Signed)
Condition Code 44 Documentation Completed  Patient Details  Name: Tammy Brady MRN: [id number] Date of Birth: 09-18-1950   Condition Code 44 given:  Yes Patient signature on Condition Code 44 notice:  Yes Documentation of 2 MD's agreement:  Yes Code 44 added to claim:  Yes    Lynnell Catalan, RN 05/18/2021, 3:57 PM

## 2021-05-19 ENCOUNTER — Inpatient Hospital Stay: Payer: Medicare Other | Attending: Hematology and Oncology | Admitting: Adult Health

## 2021-05-19 ENCOUNTER — Other Ambulatory Visit: Payer: Self-pay

## 2021-05-19 ENCOUNTER — Encounter: Payer: Self-pay | Admitting: Adult Health

## 2021-05-19 VITALS — BP 131/57 | HR 72 | Temp 97.9°F | Resp 18 | Ht 66.0 in | Wt 162.2 lb

## 2021-05-19 DIAGNOSIS — E039 Hypothyroidism, unspecified: Secondary | ICD-10-CM | POA: Diagnosis not present

## 2021-05-19 DIAGNOSIS — T451X5D Adverse effect of antineoplastic and immunosuppressive drugs, subsequent encounter: Secondary | ICD-10-CM | POA: Insufficient documentation

## 2021-05-19 DIAGNOSIS — Z87891 Personal history of nicotine dependence: Secondary | ICD-10-CM | POA: Insufficient documentation

## 2021-05-19 DIAGNOSIS — K1231 Oral mucositis (ulcerative) due to antineoplastic therapy: Secondary | ICD-10-CM | POA: Insufficient documentation

## 2021-05-19 DIAGNOSIS — C060 Malignant neoplasm of cheek mucosa: Secondary | ICD-10-CM | POA: Diagnosis not present

## 2021-05-19 DIAGNOSIS — D649 Anemia, unspecified: Secondary | ICD-10-CM | POA: Insufficient documentation

## 2021-05-19 DIAGNOSIS — I890 Lymphedema, not elsewhere classified: Secondary | ICD-10-CM | POA: Diagnosis not present

## 2021-05-19 LAB — TYPE AND SCREEN
ABO/RH(D): A POS
Antibody Screen: NEGATIVE
Unit division: 0

## 2021-05-19 LAB — BPAM RBC
Blood Product Expiration Date: 202212022359
ISSUE DATE / TIME: 202211081511
Unit Type and Rh: 6200

## 2021-05-19 NOTE — Progress Notes (Signed)
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  Her pathologic staging is pT3 N3B.   02/19/2021 Cancer Staging   Staging form: Oral Cavity, AJCC 8th Edition - Pathologic stage from 02/19/2021: Stage IVB (pT3, pN3b, cM0) - Signed by Eppie Gibson, MD on 02/20/2021 Stage prefix: Initial diagnosis    03/08/2021 - 04/19/2021 Radiation Therapy   Radiation therapy given concurrently with weekly Cisplatin   03/10/2021 - 04/15/2021 Chemotherapy   Patient is on Treatment Plan :  HEAD/NECK Cisplatin q7d  x 6 cycles     CURRENT THERAPY:  INTERVAL HISTORY: Tammy Brady 70 y.o. female returns for evaluation and f/u.  She was admitted the past 2 days for pneumonia and a blood transfusion. She says that she has progressively worsened and became more weak.  Her oxygen level was 84% in the ER. She was discharged on antibiotics and her husband has been giving them through her PEG tube since d/c without difficulty.  She denies any pain.  She does have occasional right stabbing/piercing headaches.  She last took her morphine about 1.5 weeks ago.  These headaches began during her chemotherapy and radiation therapy.    Her weight is stable at 162.     Patient Active Problem List   Diagnosis Date Noted   Symptomatic anemia 05/18/2021   CAP (community acquired pneumonia) 05/17/2021   Normocytic anemia 05/17/2021   Moderate protein-calorie malnutrition ([place]) 05/17/2021   Hyponatremia 05/17/2021   G tube feedings ([place]) 04/29/2021   Aortic atherosclerosis ([place]) 04/25/2021   Chest pain of uncertain etiology 60/63/0160   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 chemotherapy ([place]) 04/07/2021   Mucositis due to antineoplastic therapy 03/31/2021   Drug induced constipation 03/31/2021   Chemotherapy-induced nausea 03/31/2021   Port-A-Cath in place 03/09/2021   Carcinoma of lower gum ([place]) 02/20/2021   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  MEDICAL HISTORY: Past Medical History:  Diagnosis Date   Aortic atherosclerosis ([place])    CAD (coronary artery disease)    Mild non-obstructive disease by cath January 2011. Coronary CTA showed  mild (25-49%) plaque in the RCA and minimal (<25%) coronary CTA 04/2021 with coronary Ca score 257   Carotid artery disease (  [place])    Followed by Dr Kellie Simmering   GERD (gastroesophageal reflux disease)    Hyperlipidemia    Hypertension    Hypothyroidism     SURGICAL HISTORY: Past Surgical History:  Procedure Laterality Date   APPENDECTOMY     IR IMAGING GUIDED PORT INSERTION  03/02/2021    SOCIAL HISTORY: Social  History   Socioeconomic History   Marital status: Married    Spouse name: Not on file   Number of children: Not on file   Years of education: Not on file   Highest education level: Not on file  Occupational History   Occupation: Field seismologist: [employer]  Tobacco Use   Smoking status: Former    Packs/day: 1.00    Years: 40.00    Pack years: 40.00    Types: Cigarettes    Quit date: 05/11/2009    Years since quitting: 12.0   Smokeless tobacco: Never  Substance and Sexual Activity   Alcohol use: Not Currently   Drug use: No   Sexual activity: Not Currently  Other Topics Concern   Not on file  Social History Narrative   Not on file   Social Determinants of Health   Financial Resource Strain: Low Risk    Difficulty of Paying Living Expenses: Not hard at all  Food Insecurity: No Food Insecurity   Worried About Charity fundraiser in the Last Year: Never true   [place] in the Last Year: Never true  Transportation Needs: No Transportation Needs   Lack of Transportation (Medical): No   Lack of Transportation (Non-Medical): No  Physical Activity: Not on file  Stress: No Stress Concern Present   Feeling of Stress : Only a little  Social Connections: Engineer, building services of Communication with Friends and Family: More than three times a week   Frequency of Social Gatherings with Friends and Family: More than three times a week   Attends Religious Services: More than 4 times per year   Active Member of [employer] or Organizations: Yes   Attends Music therapist: More than 4 times per year   Marital Status: Married  Human resources officer Violence: Not on file    FAMILY HISTORY: Family History  Problem Relation Age of Onset   Cancer Mother    Coronary artery disease Father    Coronary artery disease Maternal Grandmother     Review of Systems  Constitutional:  Positive for fatigue. Negative for appetite change, chills, fever and unexpected  weight change.  HENT:   Positive for sore throat and trouble swallowing. Negative for hearing loss and lump/mass.        She has some swelling to her neck she says is related to her lymphedema from her radiation therapy.  Eyes:  Negative for eye problems and icterus.  Respiratory:  Negative for chest tightness, cough and shortness of breath.   Cardiovascular:  Negative for chest pain, leg swelling and palpitations.  Gastrointestinal:  Negative for abdominal distention, abdominal pain, constipation, diarrhea, nausea and vomiting.  Endocrine: Negative for hot flashes.  Genitourinary:  Negative for difficulty urinating.   Musculoskeletal:  Negative for arthralgias.  Skin:  Negative for itching and rash.  Neurological:  Positive for headaches and speech difficulty (Related to tongue swelling from her radiation.). Negative for dizziness, extremity weakness and numbness.  Hematological:  Negative for adenopathy. Does not bruise/bleed easily.  Psychiatric/Behavioral:  Negative for depression. The patient is not nervous/anxious.  PHYSICAL EXAMINATION  ECOG PERFORMANCE STATUS: 2 - Symptomatic, <50% confined to bed  Vitals:   05/19/21 1007  BP: (!) 131/57  Pulse: 72  Resp: 18  Temp: 97.9 F (36.6 C)  SpO2: 96%    Physical Exam Constitutional:      General: She is not in acute distress.    Appearance: Normal appearance. She is not toxic-appearing.  HENT:     Head: Normocephalic and atraumatic.     Comments:  Lymphedema is noted.  She also has some swelling of her tongue and slurred speech related to her recent concurrent chemoradiation. Eyes:     General: No scleral icterus.    Extraocular Movements: Extraocular movements intact.     Pupils: Pupils are equal, round, and reactive to light.  Cardiovascular:     Rate and Rhythm: Normal rate and regular rhythm.     Pulses: Normal pulses.     Heart sounds: Normal heart sounds.  Pulmonary:     Effort: Pulmonary effort is normal.      Breath sounds: Normal breath sounds.  Abdominal:     General: Abdomen is flat. Bowel sounds are normal. There is no distension.     Palpations: Abdomen is soft.     Tenderness: There is no abdominal tenderness.  Musculoskeletal:        General: No swelling.     Cervical back: Neck supple.     Comments: Strength is 5/5 x 4 extremities  Lymphadenopathy:     Cervical: No cervical adenopathy.  Skin:    General: Skin is warm and dry.     Capillary Refill: Capillary refill takes less than 2 seconds.     Findings: No rash.  Neurological:     General: No focal deficit present.     Mental Status: She is alert.     Cranial Nerves: No cranial nerve deficit.  Psychiatric:        Mood and Affect: Mood normal.        Behavior: Behavior normal.    LABORATORY DATA:  CBC    Component Value Date/Time   WBC 4.7 05/18/2021 0516   RBC 2.63 (L) 05/18/2021 0516   HGB 9.4 (L) 05/18/2021 1744   HGB 8.3 (L) 05/07/2021 1057   HGB 10.0 (L) 04/20/2021 1356   HCT 29.7 (L) 05/18/2021 1744   HCT 30.9 (L) 04/20/2021 1356   PLT 203 05/18/2021 0516   PLT 221 05/07/2021 1057   PLT 218 04/20/2021 1356   MCV 89.7 05/18/2021 0516   MCV 87 04/20/2021 1356   MCH 28.9 05/18/2021 0516   MCHC 32.2 05/18/2021 0516   RDW 20.7 (H) 05/18/2021 0516   RDW 16.0 (H) 04/20/2021 1356   LYMPHSABS 0.3 (L) 05/17/2021 0520   LYMPHSABS 0.2 (L) 04/20/2021 1356   MONOABS 0.8 05/17/2021 0520   EOSABS 0.0 05/17/2021 0520   EOSABS 0.0 04/20/2021 1356   BASOSABS 0.0 05/17/2021 0520   BASOSABS 0.0 04/20/2021 1356    CMP     Component Value Date/Time   NA 133 (L) 05/17/2021 1304   NA 134 04/20/2021 1356   K 3.9 05/17/2021 1304   CL 98 05/17/2021 1304   CO2 25 05/17/2021 0520   GLUCOSE 96 05/17/2021 1304   BUN 14 05/17/2021 1304   BUN 21 04/20/2021 1356   CREATININE 0.50 05/17/2021 1304   CREATININE 0.68 05/07/2021 1057   CALCIUM 8.6 (L) 05/17/2021 0520   PROT 6.9 05/17/2021 0520   PROT 6.4 04/20/2021 1356  ALBUMIN 2.7 (L) 05/17/2021 0520   ALBUMIN 3.4 (L) 04/20/2021 1356   AST 26 05/17/2021 0520   AST 19 05/07/2021 1057   ALT 14 05/17/2021 0520   ALT 17 05/07/2021 1057   ALKPHOS 71 05/17/2021 0520   BILITOT 0.4 05/17/2021 0520   BILITOT 0.3 05/07/2021 1057   GFRNONAA >60 05/17/2021 0520   GFRNONAA >60 05/07/2021 1057   GFRAA 83 08/26/2020 0927     ASSESSMENT and THERAPY PLAN:   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  Her pathologic staging is pT3 N3B.  Given positive margins and presence of extracapsular extension, we agreed with adjuvant chemoradiation. She completed 6 weekly cycles of cisplatin concurrent with radiation.  Tammy Brady is here for follow-up of her continued symptoms after receiving chemotherapy and radiation.  She continues to struggle.  Her hemoglobin has improved to 9.4 since she received a transfusion yesterday, she is tolerating her via tube antibiotics without difficulty.  She will continue those antibiotics.  Instead of doing a chest x-ray next week, we will do 1 in 4 weeks.  I counseled her on her fatigue.  I recommended small amounts of exercise such as 5 minutes of walk once a day.  I also recommended that she do some chair exercises to help with her strength level.  We discussed her new headaches.  I recommended imaging which she and her husband agreed to, but would like to discuss with her surgeon at [facility] since she is seeing them on November 18.  I will see her on November 22 for labs and follow-up and we will discuss further during that time.   All  questions were answered. The patient knows to call the clinic with any problems, questions or concerns. We can certainly see the patient much sooner if necessary.  Total encounter time: 30 minutes in face-to-face visit time, chart review, lab review, order entry, care coordination, and documentation of the encounter.  Wilber Bihari, NP 05/19/21 10:49 AM Medical Oncology and Hematology [facility] [place], [place] [address] Tel. [phone]    Fax. [phone]  *Total Encounter Time as defined by the Centers for Medicare and Medicaid Services includes, in addition to the face-to-face time of a patient visit (documented in the note above) non-face-to-face time: obtaining and reviewing outside history, ordering and reviewing medications, tests or procedures, care coordination (communications with other health care professionals or caregivers) and documentation in the medical record.

## 2021-05-19 NOTE — Discharge Summary (Addendum)
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 [facility] Course   [name] is a 70 yo female with PMH nonobstructive CAD, GERD, HLD, HTN, hypothyroidism, GERD squamous carcinoma of oral mucosa with G-tube dependency for nutrition who presented with progressively worsening weakness for 1 to 2 weeks at home. There was also some reported mild dyspnea, chills, and fever on admission however no cough or sputum production. She underwent work-up in the ER and was found to be anemic, hemoglobin 8.6 g/dL which further downtrended to 7.6 g/dL the the morning following admission.  She continues on cisplatin treatment outpatient for her cancer treatment. She was evaluated with CXR and CT angio chest as well.  This was negative for PE but showed wedge-shaped consolidation in the left upper lobe unchanged from prior imaging on 05/03/2021 but new since June 2022. She was admitted and started on antibiotics for pneumonia treatment.  Her weakness was considered to be multifactorial more from probable symptomatic anemia with some possible contribution from underlying pneumonia.  She was amenable with transfusion of 1 unit PRBC while in hospital and continuing antibiotics at discharge. She was comfortable and also amenable with  discharging home after her blood transfusion.    Condition at discharge: stable  Exam Physical Exam Constitutional:      Comments: Mild fatigued appearance  HENT:     Head: Normocephalic and atraumatic.     Mouth/Throat:     Mouth: Mucous membranes are dry.     Comments: Protruding tongue noted Eyes:     Extraocular Movements: Extraocular movements intact.  Cardiovascular:     Rate and Rhythm: Normal rate and regular rhythm.  Pulmonary:     Effort: Pulmonary effort is normal.     Breath sounds: Normal breath sounds.  Abdominal:     General: Bowel sounds are normal.     Palpations: Abdomen is soft.     Comments: G tube in place  Musculoskeletal:        General: Normal range of motion.     Cervical back: Normal range of motion and neck supple.  Skin:    General: Skin is warm and dry.  Neurological:     General: No focal deficit present.     Mental Status: She is alert.  Psychiatric:        Mood and Affect: Mood normal.        Behavior: Behavior normal.     Disposition: Home  Discharge time: greater than 30 minutes.   Allergies as of 05/18/2021       Reactions   Codeine Other (See Comments)   Headache  HEADACHE        Medication List     STOP taking these medications    dexamethasone 4 MG tablet Commonly known as: DECADRON  omeprazole 20 MG capsule Commonly known as: PRILOSEC   ondansetron 8 MG tablet Commonly known as: Zofran   prochlorperazine 10 MG tablet Commonly known as: COMPAZINE       TAKE these medications    acetaminophen 500 MG tablet Commonly known as: TYLENOL Take 500 mg by mouth every 6 (six) hours as needed for mild pain.   amoxicillin 400 MG/5ML suspension Commonly known as: AMOXIL Place 10.9 mLs (875 mg total) into feeding tube 2 (two) times daily for 4 days.   aspirin EC 81 MG tablet Take 1 tablet (81 mg total) by mouth daily. Swallow whole.   azithromycin 200 MG/5ML suspension Commonly known as: ZITHROMAX Place  6.3 mLs (250 mg total) into feeding tube daily for 4 days.   calcium carbonate 600 MG Tabs tablet Commonly known as: OS-CAL Take 600 mg by mouth daily.   diltiazem 360 MG 24 hr tablet Commonly known as: CARDIZEM LA Take 1 tablet (360 mg total) by mouth daily.   feeding supplement (OSMOLITE 1.5 CAL) Liqd Place 360 mLs into feeding tube in the morning, at noon, in the evening, and at bedtime.   gabapentin 300 MG capsule Commonly known as: NEURONTIN Take 300 mg by mouth 3 (three) times daily.   levothyroxine 112 MCG tablet Commonly known as: SYNTHROID TAKE 1 TABLET BY MOUTH  DAILY   lidocaine-prilocaine cream Commonly known as: EMLA Apply to affected area once What changed:  how much to take how to take this when to take this reasons to take this additional instructions   MIRALAX PO Take 17 g by mouth daily as needed (constipation).   morphine CONCENTRATE 10 mg / 0.5 ml concentrated solution Take 0.25-0.5 mLs (5-10 mg total) by mouth every 6 (six) hours as needed for severe pain.   nitroGLYCERIN 0.4 MG SL tablet Commonly known as: NITROSTAT Place 1 tablet (0.4 mg total) under the tongue every 5 (five) minutes as needed. What changed: reasons to take this   simvastatin 40 MG tablet Commonly known as: ZOCOR TAKE 1 TABLET BY MOUTH AT  BEDTIME   traZODone 50 MG tablet Commonly known as: DESYREL Take 1 tablet (50 mg total) by mouth at bedtime.        DG Chest 2 View  Result Date: 05/17/2021 CLINICAL DATA:  Shortness of breath, weakness EXAM: CHEST - 2 VIEW COMPARISON:  Prior chest x-ray 05/03/2021 FINDINGS: Right IJ approach single-lumen power injectable port catheter. Catheter tip overlies the upper SVC. Stable cardiac and mediastinal contours. Atherosclerotic calcifications present in the transverse aorta. Chronic bronchitic changes and left mid lung patchy airspace opacity are stable compared to prior. No new airspace opacity, pulmonary edema, pleural effusion or  pneumothorax. No acute osseous abnormality. Atherosclerotic calcifications present in the transverse aorta. IMPRESSION: 1. Stable chest x-ray without significant interval change compared to prior imaging. 2. Chronic bronchitic changes, aortic atherosclerosis, portacatheter in place with the tip overlying the upper SVC. Electronically Signed   By: Jacqulynn Cadet M.D.   On: 05/17/2021 06:01   DG Ribs Unilateral W/Chest Left  Result Date: 05/04/2021 CLINICAL DATA:  Fall 2 days ago EXAM: LEFT RIBS AND CHEST - 3+ VIEW COMPARISON:  None. FINDINGS: Port in the anterior chest wall with tip in distal SVC. Percutaneous gastrostomy tube noted. 3.0 x 1.7 cm angular density in the LEFT upper lobe. Dedicated views of the LEFT ribs demonstrate no displaced fracture. No pneumothorax. IMPRESSION: 1. Angular density in the LEFT upper lobe suggest small focus of pneumonia or potentially  pulmonary contusion history of trauma. Followup PA and lateral chest X-ray is recommended in 3-4 weeks following trial of antibiotic therapy to ensure resolution and exclude underlying malignancy. 2. No evidence of LEFT rib fracture. 3. No pneumothorax. These results will be called to the ordering clinician or representative by the Radiologist Assistant, and communication documented in the PACS or [employer]. Electronically Signed   By: Suzy Bouchard M.D.   On: 05/04/2021 09:40   CT Angio Chest PE W and/or Wo Contrast  Result Date: 05/17/2021 CLINICAL DATA:  70 year old female with weakness. Reportedly undergoing treatment for oral cancer. EXAM: CT ANGIOGRAPHY CHEST WITH CONTRAST TECHNIQUE: Multidetector CT imaging of the chest was performed using the standard protocol during bolus administration of intravenous contrast. Multiplanar CT image reconstructions and MIPs were obtained to evaluate the vascular anatomy. CONTRAST:  57mL OMNIPAQUE IOHEXOL 350 MG/ML SOLN COMPARISON:  Chest radiographs 0539 hours today. Chest radiographs  05/03/2021. CTA chest 11/21/2007. Outside [facility] CT neck 12/14/2020. FINDINGS: Cardiovascular: Good contrast bolus timing in the pulmonary arterial tree. No focal filling defect identified in the pulmonary arteries to suggest acute pulmonary embolism. Calcified aortic atherosclerosis. No cardiomegaly or pericardial effusion. Calcified coronary artery atherosclerosis. Right chest Port-A-Cath. Mediastinum/Nodes: No mediastinal lymphadenopathy. Lungs/Pleura: Volume loss in the left hemithorax since 2009. Wedge-shaped consolidation in the left upper lobe extending from the hilum to the pleural surface on series 6, image 48. This is not enhancing. Minimal air bronchograms. This is unresolved since the radiographs 05/03/2021. Superimposed centrilobular emphysema. Major airways are patent. Additional dependent atelectasis in both lungs. No pleural effusion. Upper Abdomen: Negative visible liver, spleen, gallbladder, pancreas, adrenal glands and kidneys. Percutaneous gastrostomy tube with no adverse features. Musculoskeletal: No acute or suspicious osseous lesion identified. Review of the MIP images confirms the above findings. IMPRESSION: 1. Negative for acute pulmonary embolus. 2. Wedge-shaped consolidation in the left upper lobe, unresolved since radiographs on 05/03/2021, but seems to be new from outside Neck CT 12/14/2020. Favor unresolved pneumonia. Bronchogenic carcinoma or confluent pulmonary scarring less likely. Underlying Emphysema (ICD10-J43.9). Recommend CT Chest surveillance in conjunction with cancer imaging. 3. Calcified coronary artery and Aortic Atherosclerosis (ICD10-I70.0). Electronically Signed   By: Genevie Ann M.D.   On: 05/17/2021 07:18   CT CORONARY MORPH W/CTA COR W/SCORE W/CA W/CM &/OR WO/CM  Addendum Date: 04/22/2021   ADDENDUM REPORT: 04/22/2021 15:15 CLINICAL DATA:  50F with CAD, hypertension, hyperlipidemia, carotid stenosis and back pain. EXAM: Cardiac/Coronary  CT TECHNIQUE: The patient was  scanned on a [employer]. FINDINGS: A 120 kV prospective scan was triggered in the descending thoracic aorta at 111 HU's. Axial non-contrast 3 mm slices were carried out through the heart. The data set was analyzed on a dedicated work station and scored using the [place]. Gantry rotation speed was 250 msecs and collimation was .6 mm. No beta blockade and 0.8 mg of sl NTG was given. The 3D data set was reconstructed in 5% intervals of the 67-82 % of the R-R cycle. Diastolic phases were analyzed on a dedicated work station using MPR, MIP and VRT modes. The patient received 80 cc of contrast. Aorta: Normal size. Ascending aorta 3.1 cm. Diffuse aortic atherosclerosis. No dissection. Aortic Valve:  [place]leaf[place].  No calcifications. Coronary Arteries:  Normal coronary origin.  Right dominance. RCA is a large dominant artery that gives rise to PDA and PLVB. There is mild (25-49%) calcified plaque in the mid RCA and minimal (<25%) calcified plaque in the distal RCA. Left main is a  large artery that gives rise to LAD, RI, and LCX arteries. LAD is a large vessel that has minimal (<25%) calcified plaque in the proximal and mid LAD. LCX is a non-dominant artery that gives rise to one large OM1 branch. There is no plaque. RI is a small vessel without plaque. Coronary Calcium Score: Left main: 0 Left anterior descending artery: 53.8 Left circumflex artery: 0 Right coronary artery: 203 Total: 257 Percentile: 87th Other findings: Normal pulmonary vein drainage into the left atrium. There are three pulmonary veins on the right and two on the left. Normal let atrial appendage without a thrombus. Normal size of the pulmonary artery. IMPRESSION: 1. Coronary calcium score of 257. This was 87th percentile for age-, race-, and sex-matched controls. 2. Normal coronary origin with right dominance. 3. There is mild (25-49%) plaque in the RCA and minimal (<25%) plaque in the LAD. 4. Recommend aggressive risk factor  modification, including LDL goal <70. Skeet Latch, MD Electronically Signed   By: Skeet Latch M.D.   On: 04/22/2021 15:15   Result Date: 04/22/2021 EXAM: OVER-READ INTERPRETATION  CT CHEST The following report is an over-read performed by radiologist Dr. Vinnie Langton of [facility], [place] on 04/22/2021. This over-read does not include interpretation of cardiac or coronary anatomy or pathology. The coronary calcium score/coronary CTA interpretation by the cardiologist is attached. COMPARISON:  None. FINDINGS: Central venous catheter terminating at the superior cavoatrial junction. Atherosclerotic calcifications in the thoracic aorta. Mild emphysematous changes are noted throughout the visualize lungs. Within the visualized portions of the thorax there are no suspicious appearing pulmonary nodules or masses, there is no acute consolidative airspace disease, no pleural effusions, no pneumothorax and no lymphadenopathy. Visualized portions of the upper abdomen are unremarkable. There are no aggressive appearing lytic or blastic lesions noted in the visualized portions of the skeleton. IMPRESSION: 1.  Aortic Atherosclerosis (ICD10-I70.0). 2.  Emphysema (ICD10-J43.9). Electronically Signed: By: Vinnie Langton M.D. On: 04/22/2021 14:02   Results for orders placed or performed during the hospital encounter of 05/17/21  Resp Panel by RT-PCR (Flu A&B, Covid) Nasopharyngeal Swab     Status: None   Collection Time: 05/17/21  5:20 AM   Specimen: Nasopharyngeal Swab; Nasopharyngeal(NP) swabs in vial transport medium  Result Value Ref Range Status   SARS Coronavirus 2 by RT PCR NEGATIVE NEGATIVE Final    Comment: (NOTE) SARS-CoV-2 target nucleic acids are NOT DETECTED.  The SARS-CoV-2 RNA is generally detectable in upper respiratory specimens during the acute phase of infection. The lowest concentration of SARS-CoV-2 viral copies this assay can detect is 138 copies/mL. A negative result does  not preclude SARS-Cov-2 infection and should not be used as the sole basis for treatment or other patient management decisions. A negative result may occur with  improper specimen collection/handling, submission of specimen other than nasopharyngeal swab, presence of viral mutation(s) within the areas targeted by this assay, and inadequate number of viral copies(<138 copies/mL). A negative result must be combined with clinical observations, patient history, and epidemiological information. The expected result is Negative.  Fact Sheet for Patients:  EntrepreneurPulse.com.au  Fact Sheet for Healthcare Providers:  IncredibleEmployment.be  This test is no t yet approved or cleared by the [place] FDA and  has been authorized for detection and/or diagnosis of SARS-CoV-2 by FDA under an Emergency Use Authorization (EUA). This EUA will remain  in effect (meaning this test can be used) for the duration of the COVID-19 declaration under Section 564(b)(1) of the Act, 21  U.S.C.section 360bbb-3(b)(1), unless the authorization is terminated  or revoked sooner.       Influenza A by PCR NEGATIVE NEGATIVE Final   Influenza B by PCR NEGATIVE NEGATIVE Final    Comment: (NOTE) The Xpert Xpress SARS-CoV-2/FLU/RSV plus assay is intended as an aid in the diagnosis of influenza from Nasopharyngeal swab specimens and should not be used as a sole basis for treatment. Nasal washings and aspirates are unacceptable for Xpert Xpress SARS-CoV-2/FLU/RSV testing.  Fact Sheet for Patients: EntrepreneurPulse.com.au  Fact Sheet for Healthcare Providers: IncredibleEmployment.be  This test is not yet approved or cleared by the [place] FDA and has been authorized for detection and/or diagnosis of SARS-CoV-2 by FDA under an Emergency Use Authorization (EUA). This EUA will remain in effect (meaning this test can be used) for the  duration of the COVID-19 declaration under Section 564(b)(1) of the Act, 21 U.S.C. section 360bbb-3(b)(1), unless the authorization is terminated or revoked.  Performed at [facility], [place] [address] [place], [place] [address]   Blood culture (routine x 2)     Status: None (Preliminary result)   Collection Time: 05/17/21  5:20 AM   Specimen: BLOOD RIGHT HAND  Result Value Ref Range Status   Specimen Description   Final    BLOOD RIGHT HAND Performed at [facility] [address] [place], [place] [address]    Special Requests   Final    BOTTLES DRAWN AEROBIC AND ANAEROBIC Blood Culture adequate volume Performed at [facility] [address], [place], [place] [address]    Culture   Final    NO GROWTH 2 DAYS Performed at [facility] [address] [place], [place] [address]    Report Status PENDING  Incomplete  Blood culture (routine x 2)     Status: None (Preliminary result)   Collection Time: 05/17/21  1:05 PM   Specimen: BLOOD  Result Value Ref Range Status   Specimen Description   Final    BLOOD LEFT ANTECUBITAL Performed at [facility] [address], [place], [place] [address]    Special Requests   Final    BOTTLES DRAWN AEROBIC ONLY Blood Culture adequate volume Performed at [facility] [address], [place], [place] [address]    Culture   Final    NO GROWTH 2 DAYS Performed at [facility] [address], [place], [place] [address]    Report Status PENDING  Incomplete    Signed:  Dwyane Dee MD.  [facility] 05/19/2021, 5:46 PM

## 2021-05-19 NOTE — Assessment & Plan Note (Addendum)
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  Her pathologic staging is pT3 N3B.  Given positive margins and presence of extracapsular extension, we agreed with adjuvant chemoradiation. She completed 6 weekly cycles of cisplatin concurrent with radiation.  Tammy Brady is here for follow-up of her continued symptoms after receiving chemotherapy and radiation.  She continues to struggle.  Her hemoglobin has improved to 9.4 since she received a transfusion yesterday, she is tolerating her via tube antibiotics without difficulty.  She will continue those antibiotics.  Instead of doing a chest x-ray next week, we will do 1 in 4 weeks.  I counseled her on her fatigue.  I recommended small amounts of exercise such as 5 minutes of walk once a day.  I also recommended that she do some chair exercises to help with her strength level.  We discussed her new headaches.  I recommended imaging which she and her husband agreed to, but would like to discuss with her surgeon at [facility] since she is seeing them on November 18.  I will see her on November 22 for labs and follow-up and we will discuss further during that time.

## 2021-05-20 ENCOUNTER — Telehealth: Payer: Self-pay | Admitting: Dietician

## 2021-05-20 ENCOUNTER — Encounter: Payer: Self-pay | Admitting: Physical Therapy

## 2021-05-20 NOTE — Telephone Encounter (Signed)
Nutrition Unable to reach patient for nutrition follow-up. Left message with request for return call. Contact information provided.

## 2021-05-22 LAB — CULTURE, BLOOD (ROUTINE X 2)
Culture: NO GROWTH
Culture: NO GROWTH
Special Requests: ADEQUATE
Special Requests: ADEQUATE

## 2021-05-24 ENCOUNTER — Other Ambulatory Visit: Payer: Self-pay

## 2021-05-24 ENCOUNTER — Ambulatory Visit: Payer: Medicare Other | Admitting: Physical Therapy

## 2021-05-24 ENCOUNTER — Encounter: Payer: Self-pay | Admitting: Physical Therapy

## 2021-05-24 DIAGNOSIS — M25612 Stiffness of left shoulder, not elsewhere classified: Secondary | ICD-10-CM

## 2021-05-24 DIAGNOSIS — T451X5D Adverse effect of antineoplastic and immunosuppressive drugs, subsequent encounter: Secondary | ICD-10-CM | POA: Diagnosis not present

## 2021-05-24 DIAGNOSIS — E039 Hypothyroidism, unspecified: Secondary | ICD-10-CM | POA: Diagnosis not present

## 2021-05-24 DIAGNOSIS — I890 Lymphedema, not elsewhere classified: Secondary | ICD-10-CM | POA: Diagnosis not present

## 2021-05-24 DIAGNOSIS — C060 Malignant neoplasm of cheek mucosa: Secondary | ICD-10-CM | POA: Diagnosis not present

## 2021-05-24 DIAGNOSIS — M25611 Stiffness of right shoulder, not elsewhere classified: Secondary | ICD-10-CM

## 2021-05-24 DIAGNOSIS — L599 Disorder of the skin and subcutaneous tissue related to radiation, unspecified: Secondary | ICD-10-CM

## 2021-05-24 DIAGNOSIS — R262 Difficulty in walking, not elsewhere classified: Secondary | ICD-10-CM

## 2021-05-24 DIAGNOSIS — Z483 Aftercare following surgery for neoplasm: Secondary | ICD-10-CM

## 2021-05-24 DIAGNOSIS — K1231 Oral mucositis (ulcerative) due to antineoplastic therapy: Secondary | ICD-10-CM | POA: Diagnosis not present

## 2021-05-24 DIAGNOSIS — D649 Anemia, unspecified: Secondary | ICD-10-CM | POA: Diagnosis not present

## 2021-05-24 DIAGNOSIS — Z87891 Personal history of nicotine dependence: Secondary | ICD-10-CM | POA: Diagnosis not present

## 2021-05-24 DIAGNOSIS — M6281 Muscle weakness (generalized): Secondary | ICD-10-CM

## 2021-05-24 NOTE — Therapy (Signed)
[place] [facility] @ [address] [place], [place], [address] Phone: [phone]   Fax:  [phone]  Physical Therapy Treatment  Patient Details  Name: Tammy Brady MRN: [id number] Date of Birth: May 12, 1951 Referring Provider (PT): Reita May Date: 05/24/2021   PT End of Session - 05/24/21 1059     Visit Number 9    Number of Visits 13    Date for PT Re-Evaluation 06/08/21    PT Start Time 0959    PT Stop Time 1058    PT Time Calculation (min) 59 min    Activity Tolerance Patient tolerated treatment well    Behavior During Therapy [facility] for tasks assessed/performed             Past Medical History:  Diagnosis Date   Aortic atherosclerosis ([place])    CAD (coronary artery disease)    Mild non-obstructive disease by cath January 2011. Coronary CTA showed  mild (25-49%) plaque in the RCA and minimal (<25%) coronary CTA 04/2021 with coronary Ca score 257   Carotid artery disease (HCC)    Followed by Dr Kellie Simmering   GERD (gastroesophageal reflux disease)    Hyperlipidemia    Hypertension    Hypothyroidism     Past Surgical History:  Procedure Laterality Date   APPENDECTOMY     IR IMAGING GUIDED PORT INSERTION  03/02/2021    There were no vitals filed for this visit.   Subjective Assessment - 05/24/21 1000     Subjective At the hospital one doctor thought it was pneumonia and the other doctor did not. I had to get a unit of blood because my hemoglobin was really low. Yesterday was the first good day I have had. My swelling was really bad in the hospital.    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 CT neck same day revealed an enhancing soft tissue lesion along the buccal margin of the left mandible; with lucency in the adjacent bone consistent with known SCC. Also found were necrotic left level IA/B and II/IIIB lymph  nodes, noted to be concerning for nodal metastasis. During physical exam performed at this visit, Dr. Amada Jupiter visualized the exophytic mass in and around the left mandibular canine, premolar, and first molar, along with an enlarged level 1A lymph node. The patient opted to proceed with multiple oral biopsies, mandibular resectioning, and lymphadenectomies, 01/18/21 Resection performed on 01/18/21 revealed the following: 3.3cm tumor, Gr 2 Squamous cell carcinoma of alveolar ridge extending to buccal gingival junction and lingual sulcus, DOI 33mm, neg margins, 6/49 LN+ bilaterally with ECE; no PNI or LVSI; She also underwent Left fibula osteocutaneous free flap and ORIF mandible, will receive 30 fractions of radiation to her oral cavity and bilateral neck and 6 weeks of cisplatin chemotherapy. She started on 8/29 and will complete 04/19/21, PEG [facility] at [facility] on 01/18/21 and exchanged on 8/5 due to leakage. She uses PEG for most nutrition and medication. She is able to tolerate pureed foods but her reconstructive flap limits her swallowing, PAC placed at [facility] on 03/02/21, reports a 40 pack year smoking history, quitting off and on over the years, stopped smoking in May 2022.    Patient Stated Goals to gain info from providers    Currently in Pain? No/denies    Pain Score 0-No pain  [facility] Adult PT Treatment/Exercise - 05/24/21 0001       Manual Therapy   Manual Lymphatic Drainage (MLD) in supine with head of bed elevated: short neck, 5 diaphragmatic breaths, bilateral axillary nodes, bilateral pectoral nodes, anterior chest, short neck, posterior, lateral and anterior neck moving fluid towards pathways, submental and submandibular areas moving fluid towards pathways then retracing all steps    Passive ROM to the cspine into rotation and sidebending bil                          PT Long Term Goals - 05/11/21 1006       PT LONG TERM GOAL #1    Title Pt will be independent in self MLD for long term management of lymphedema.    Baseline 05/11/21- pt has not been able to try this due to increased fatigue and recent illness    Time 6    Period Weeks    Status On-going      PT LONG TERM GOAL #2   Title Pt will obtain appropriate compression garments for long term management of lymphedema.    Time 6    Period Weeks    Status On-going      PT LONG TERM GOAL #3   Title Pt will report she is able to descend steps with a step through gait pattern and a decreased fear of falling.    Time 6    Period Weeks    Status On-going      PT LONG TERM GOAL #4   Title Pt will be independent in a home exercise program for continued stretching and strengthening.    Time 6    Period Weeks    Status On-going      PT LONG TERM GOAL #5   Title Pt will report her shoulder ROM has improved and she is no longer having trouble getting dressed or reaching overhead.    Baseline 05/11/21- pt still has difficulty getting things overhead    Time 6    Period Weeks    Status On-going      PT LONG TERM GOAL #6   Title Pt will be able to complete 13 sit to stands in 30 seconds without use of UEs to decrease fall risk.    Time 6    Period Weeks    Status On-going                   Plan - 05/24/21 1100     Clinical Impression Statement Pt reports she has been having neck pain recently. Her posture is poor and she presents with forward head and rounded shoulders. Educated pt on importance of cervical retraction and continuing neck ROM stretches with one minute holds. Continued to focus on anterior neck lymphedema. Pt has some increased fullness in left side of face today that improved with MLD as characterized by increased skin mobility.    PT Frequency 2x / week    PT Duration 6 weeks    PT Treatment/Interventions ADLs/Self Care Home Management;Therapeutic exercise;Balance training;Neuromuscular re-education;Stair training;Gait  training;Patient/family education;Orthotic Fit/Training;Manual techniques;Manual lymph drainage;Compression bandaging;Scar mobilization;Passive range of motion;Taping;Vasopneumatic Device;Joint Manipulations    PT Next Visit Plan cont MLD,  PROM to neck, once lymphedema is managed with garments and pump begin bilateral shoulder ROM and LE strengthening and balance    PT Home Exercise Plan head and neck ROM exercises,Access Code [email or web address]    Consulted and Agree with  Plan of Care Patient;Family member/caregiver             Patient will benefit from skilled therapeutic intervention in order to improve the following deficits and impairments:  Postural dysfunction, Impaired UE functional use, Impaired flexibility, Increased fascial restricitons, Decreased strength, Decreased knowledge of use of DME, Decreased knowledge of precautions, Decreased range of motion, Decreased scar mobility, Increased edema, Difficulty walking, Decreased balance  Visit Diagnosis: Disorder of the skin and subcutaneous tissue related to radiation, unspecified  Lymphedema, not elsewhere classified  Aftercare following surgery for neoplasm  Muscle weakness (generalized)  Difficulty in walking, not elsewhere classified  Stiffness of left shoulder, not elsewhere classified  Stiffness of right shoulder, not elsewhere classified  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[place])     Problem List Patient Active Problem List   Diagnosis Date Noted   Symptomatic anemia 05/18/2021   CAP (community acquired pneumonia) 05/17/2021   Normocytic anemia 05/17/2021   Moderate protein-calorie malnutrition ([place]) 05/17/2021   Hyponatremia 05/17/2021   G tube feedings ([employer]) 04/29/2021   Aortic atherosclerosis ([place]) 04/25/2021   Chest pain of uncertain etiology 29/56/2130   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 chemotherapy ([place]) 04/07/2021   Mucositis due to antineoplastic therapy 03/31/2021   Drug induced constipation 03/31/2021    Chemotherapy-induced nausea 03/31/2021   Port-A-Cath in place 03/09/2021   Carcinoma of lower gum ([place]) 02/20/2021   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[place]) 12/08/2020   Gastritis 12/08/2020   Anxiety 12/08/2020   Need for prophylactic vaccination and inoculation against influenza 07/30/2020   Depression 11/26/2015   GERD (gastroesophageal reflux disease) 11/26/2015   Migraine headache 11/26/2015   Screening for diabetes mellitus 09/03/2015   Hypothyroidism 07/22/2009   Mixed hyperlipidemia 07/22/2009   Tobacco user 07/22/2009   Hypertension, essential 07/22/2009   CAD, NATIVE VESSEL 07/22/2009   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   Coronary artery spasm ([place]) 07/22/2009    Allyson Sabal [place], PT 05/24/2021, 11:03 AM  [facility] @ [place] [place], [place], [address] Phone: [phone]   Fax:  [phone]  Name: Tammy Brady MRN: [id number] Date of Birth: 04-10-1951   Manus Gunning, PT 05/24/21 11:03 AM

## 2021-05-25 ENCOUNTER — Encounter: Payer: Self-pay | Admitting: Family Medicine

## 2021-05-25 ENCOUNTER — Ambulatory Visit (INDEPENDENT_AMBULATORY_CARE_PROVIDER_SITE_OTHER): Payer: Medicare Other

## 2021-05-25 ENCOUNTER — Ambulatory Visit (INDEPENDENT_AMBULATORY_CARE_PROVIDER_SITE_OTHER): Payer: Medicare Other | Admitting: Family Medicine

## 2021-05-25 VITALS — BP 110/68 | HR 81 | Temp 97.5°F | Ht 66.0 in | Wt 160.0 lb

## 2021-05-25 DIAGNOSIS — Z780 Asymptomatic menopausal state: Secondary | ICD-10-CM | POA: Diagnosis not present

## 2021-05-25 DIAGNOSIS — Z23 Encounter for immunization: Secondary | ICD-10-CM | POA: Diagnosis not present

## 2021-05-25 DIAGNOSIS — Z1231 Encounter for screening mammogram for malignant neoplasm of breast: Secondary | ICD-10-CM

## 2021-05-25 DIAGNOSIS — Z0001 Encounter for general adult medical examination with abnormal findings: Secondary | ICD-10-CM | POA: Diagnosis not present

## 2021-05-25 DIAGNOSIS — Z13820 Encounter for screening for osteoporosis: Secondary | ICD-10-CM | POA: Diagnosis not present

## 2021-05-25 NOTE — Progress Notes (Signed)
Subjective:  Patient ID: Tammy Brady, female    DOB: 10-31-1950  Age: 70 y.o. MRN: [id number]  Chief Complaint  Patient presents with   Medicare Annual Wellness    HPI Well Adult Physical: Patient here for a comprehensive physical exam.The patient reports no problems Do you take any herbs or supplements that were not prescribed by a doctor? no Are you taking calcium supplements? yes Are you taking aspirin daily? yes  Physical ("At Risk" items are starred): Patient's last physical exam was 1 year ago .  Safety: reviewed ; Patient wears a seat belt, has smoke detectors, has carbon monoxide detectors, practices appropriate gun safety, and wears sunscreen with extended sun exposure. Dental Care: biannual cleanings, brushes and flosses daily. Ophthalmology/Optometry: Annual visit.  Hearing loss: yes Vision impairments: none  Squamous cell carcinoma inside mouth and spread to mandible dxd in 01/2021. Surgical excision Mandible and did a fibular flap. Completed radiation. Has a PEG tube. Sees medical oncology and radiation at [facility]. ;  Quit smoking. May  2022. Recovering alcoholic. No alcohol since 2017.  No flowsheet data found.   Depression screen PHQ 2/9 05/25/2021  Decreased Interest 1  Down, Depressed, Hopeless 0  PHQ - 2 Score 1    Functional Status Survey: completed. Unable to pull in note.         Health Maintenance  Topic Date Due   DEXA scan (bone density measurement)  Never done   Mammogram  08/28/2021   Colon Cancer Screening  04/09/2028   Pneumonia Vaccine  Completed   HPV Vaccine  Aged Out   Flu Shot  Discontinued   Tetanus Vaccine  Discontinued   COVID-19 Vaccine  Discontinued   Hepatitis C Screening: USPSTF Recommendation to screen - Ages 53-68 yo.  Discontinued   Zoster (Shingles) Vaccine  Discontinued     Social Hx   Social History   Socioeconomic History   Marital status: Married    Spouse name: Not on file   Number of children: 1   Years of  education: Not on file   Highest education level: Not on file  Occupational History   Occupation: Field seismologist: [employer]  Tobacco Use   Smoking status: Former    Packs/day: 1.00    Years: 40.00    Pack years: 40.00    Types: Cigarettes    Quit date: 05/11/2009    Years since quitting: 12.0   Smokeless tobacco: Never  Substance and Sexual Activity   Alcohol use: Not Currently   Drug use: No   Sexual activity: Not Currently  Other Topics Concern   Not on file  Social History Narrative   Not on file   Social Determinants of Health   Financial Resource Strain: Low Risk    Difficulty of Paying Living Expenses: Not hard at all  Food Insecurity: No Food Insecurity   Worried About Charity fundraiser in the Last Year: Never true   [place] in the Last Year: Never true  Transportation Needs: No Transportation Needs   Lack of Transportation (Medical): No   Lack of Transportation (Non-Medical): No  Physical Activity: Inactive   Days of Exercise per Week: 0 days   Minutes of Exercise per Session: 0 min  Stress: No Stress Concern Present   Feeling of Stress : Only a little  Social Connections: Engineer, building services of Communication with Friends and Family: More than three times a week  Frequency of Social Gatherings with Friends and Family: More than three times a week   Attends Religious Services: More than 4 times per year   Active Member of Clubs or Organizations: Yes   Attends Archivist Meetings: More than 4 times per year   Marital Status: Married   Past Medical History:  Diagnosis Date   Aortic atherosclerosis ([place])    CAD (coronary artery disease)    Mild non-obstructive disease by cath January 2011. Coronary CTA showed  mild (25-49%) plaque in the RCA and minimal (<25%) coronary CTA 04/2021 with coronary Ca score 257   Carotid artery disease (HCC)    Followed by Dr Kellie Simmering   GERD (gastroesophageal reflux disease)     Hyperlipidemia    Hypertension    Hypothyroidism    Family History  Problem Relation Age of Onset   Cancer Mother        esophageal   Heart disease Father    Coronary artery disease Father    Hyperlipidemia Sister    Hyperlipidemia Sister    Hyperlipidemia Brother    Hyperlipidemia Brother    Hyperlipidemia Brother    Coronary artery disease Maternal Grandmother     Review of Systems  Constitutional:  Negative for appetite change, fatigue and fever.  HENT:  Negative for congestion, ear pain, sinus pressure and sore throat.   Eyes:  Negative for pain.  Respiratory:  Negative for cough, chest tightness, shortness of breath and wheezing.   Cardiovascular:  Negative for chest pain and palpitations.  Gastrointestinal:  Negative for abdominal pain, constipation, diarrhea, nausea and vomiting.  Genitourinary:  Negative for dysuria and hematuria.  Musculoskeletal:  Negative for arthralgias, back pain, joint swelling and myalgias.  Skin:  Negative for rash.  Neurological:  Negative for dizziness, weakness and headaches.  Psychiatric/Behavioral:  Negative for dysphoric mood. The patient is not nervous/anxious.     Objective:  BP 110/68 (BP Location: Right Arm, Patient Position: Sitting)   Pulse 81   Temp (!) 97.5 F (36.4 C) (Temporal)   Ht 5\' 6"  (1.676 m)   Wt 160 lb (72.6 kg)   SpO2 95%   BMI 25.82 kg/m   BP/Weight 05/31/2021 05/25/2021 97/0/2637  Systolic BP 858 850 277  Diastolic BP 47 68 57  Wt. (Lbs) 167.4 160 162.2  BMI 27.02 25.82 26.18    Physical Exam Vitals reviewed.  Constitutional:      Appearance: Normal appearance. She is normal weight.  Neck:     Vascular: No carotid bruit.  Cardiovascular:     Rate and Rhythm: Normal rate and regular rhythm.     Pulses: Normal pulses.     Heart sounds: Normal heart sounds.  Pulmonary:     Effort: Pulmonary effort is normal. No respiratory distress.     Breath sounds: Normal breath sounds.  Abdominal:     General:  Abdomen is flat. Bowel sounds are normal.     Palpations: Abdomen is soft.     Tenderness: There is no abdominal tenderness.  Neurological:     Mental Status: She is alert and oriented to person, place, and time.  Psychiatric:        Mood and Affect: Mood normal.        Behavior: Behavior normal.    Lab Results  Component Value Date   WBC 6.0 05/31/2021   HGB 10.5 (L) 05/31/2021   HCT 32.6 (L) 05/31/2021   PLT 301 05/31/2021   GLUCOSE 137 (H) 05/31/2021  CHOL 144 04/20/2021   TRIG 119 04/20/2021   HDL 55 04/20/2021   LDLCALC 68 04/20/2021   ALT 14 05/17/2021   AST 26 05/17/2021   NA 138 05/31/2021   K 4.2 05/31/2021   CL 102 05/31/2021   CREATININE 0.73 05/31/2021   BUN 19 05/31/2021   CO2 27 05/31/2021   TSH 1.080 04/20/2021   INR 0.99 07/16/2009   HGBA1C  07/15/2009    5.1 (NOTE) The ADA recommends the following therapeutic goal for glycemic control related to Hgb A1c measurement: Goal of therapy: <6.5 Hgb A1c  Reference: American Diabetes Association: Clinical Practice Recommendations 2010, Diabetes Care, 2010, 33: (Suppl  1).      Assessment & Plan:   Problem List Items Addressed This Visit       Other   Encounter for general adult medical examination with abnormal findings - Primary    Education given.       Encounter for osteoporosis screening in asymptomatic postmenopausal patient    Order dexa.      Relevant Orders   DG Bone Density   Visit for screening mammogram    Order mammogram      Relevant Orders   MM DIGITAL SCREENING BILATERAL   Need for immunization against influenza    Fluad given      Relevant Orders   Flu Vaccine QUAD High Dose(Fluad) (Completed)   Refused vaccinations.    This is a list of the screening recommended for you and due dates:  Health Maintenance  Topic Date Due   DEXA scan (bone density measurement)  Never done   Mammogram  08/28/2021   Colon Cancer Screening  04/09/2028   Pneumonia Vaccine  Completed    HPV Vaccine  Aged Out   Flu Shot  Discontinued   Tetanus Vaccine  Discontinued   COVID-19 Vaccine  Discontinued   Hepatitis C Screening: USPSTF Recommendation to screen - Ages 67-68 yo.  Discontinued   Zoster (Shingles) Vaccine  Discontinued     AN INDIVIDUALIZED CARE PLAN: was established or reinforced today.   SELF MANAGEMENT: The patient and I together assessed ways to personally work towards obtaining the recommended goals  Support needs The patient and/or family needs were assessed and services were offered and not necessary at this time.    Follow-up: No follow-ups on file.    I,Lauren M Auman,acting as a scribe for [name], MD.,have documented all relevant documentation on the behalf of [name], MD,as directed by  [name], MD while in the presence of [name], MD.   [name], MD [place] [phone]

## 2021-05-26 ENCOUNTER — Encounter: Payer: Self-pay | Admitting: Physical Therapy

## 2021-05-27 ENCOUNTER — Encounter: Payer: Medicare Other | Admitting: Physician Assistant

## 2021-05-27 ENCOUNTER — Ambulatory Visit: Payer: Medicare Other | Admitting: Adult Health

## 2021-05-28 DIAGNOSIS — C760 Malignant neoplasm of head, face and neck: Secondary | ICD-10-CM | POA: Diagnosis not present

## 2021-05-28 DIAGNOSIS — C031 Malignant neoplasm of lower gum: Secondary | ICD-10-CM | POA: Diagnosis not present

## 2021-05-31 ENCOUNTER — Ambulatory Visit: Payer: Medicare Other | Admitting: Physical Therapy

## 2021-05-31 ENCOUNTER — Encounter: Payer: Self-pay | Admitting: Physical Therapy

## 2021-05-31 ENCOUNTER — Inpatient Hospital Stay: Payer: Medicare Other

## 2021-05-31 ENCOUNTER — Other Ambulatory Visit: Payer: Self-pay

## 2021-05-31 ENCOUNTER — Encounter: Payer: Self-pay | Admitting: Adult Health

## 2021-05-31 ENCOUNTER — Inpatient Hospital Stay (HOSPITAL_BASED_OUTPATIENT_CLINIC_OR_DEPARTMENT_OTHER): Payer: Medicare Other | Admitting: Adult Health

## 2021-05-31 VITALS — BP 116/47 | HR 79 | Temp 98.9°F | Resp 18 | Ht 66.0 in | Wt 167.4 lb

## 2021-05-31 DIAGNOSIS — C060 Malignant neoplasm of cheek mucosa: Secondary | ICD-10-CM

## 2021-05-31 DIAGNOSIS — Z87891 Personal history of nicotine dependence: Secondary | ICD-10-CM | POA: Diagnosis not present

## 2021-05-31 DIAGNOSIS — M6281 Muscle weakness (generalized): Secondary | ICD-10-CM

## 2021-05-31 DIAGNOSIS — R262 Difficulty in walking, not elsewhere classified: Secondary | ICD-10-CM

## 2021-05-31 DIAGNOSIS — I890 Lymphedema, not elsewhere classified: Secondary | ICD-10-CM | POA: Diagnosis not present

## 2021-05-31 DIAGNOSIS — M25612 Stiffness of left shoulder, not elsewhere classified: Secondary | ICD-10-CM

## 2021-05-31 DIAGNOSIS — E039 Hypothyroidism, unspecified: Secondary | ICD-10-CM | POA: Diagnosis not present

## 2021-05-31 DIAGNOSIS — L599 Disorder of the skin and subcutaneous tissue related to radiation, unspecified: Secondary | ICD-10-CM

## 2021-05-31 DIAGNOSIS — K1231 Oral mucositis (ulcerative) due to antineoplastic therapy: Secondary | ICD-10-CM | POA: Diagnosis not present

## 2021-05-31 DIAGNOSIS — M25611 Stiffness of right shoulder, not elsewhere classified: Secondary | ICD-10-CM

## 2021-05-31 DIAGNOSIS — T451X5D Adverse effect of antineoplastic and immunosuppressive drugs, subsequent encounter: Secondary | ICD-10-CM | POA: Diagnosis not present

## 2021-05-31 DIAGNOSIS — D649 Anemia, unspecified: Secondary | ICD-10-CM | POA: Diagnosis not present

## 2021-05-31 DIAGNOSIS — Z483 Aftercare following surgery for neoplasm: Secondary | ICD-10-CM

## 2021-05-31 LAB — BASIC METABOLIC PANEL - CANCER CENTER ONLY
Anion gap: 9 (ref 5–15)
BUN: 19 mg/dL (ref 8–23)
CO2: 27 mmol/L (ref 22–32)
Calcium: 9.4 mg/dL (ref 8.9–10.3)
Chloride: 102 mmol/L (ref 98–111)
Creatinine: 0.73 mg/dL (ref 0.44–1.00)
GFR, Estimated: >60 mL/min (ref >60)
Glucose, Bld: 137 mg/dL — ABNORMAL HIGH (ref 70–99)
Potassium: 4.2 mmol/L (ref 3.5–5.1)
Sodium: 138 mmol/L (ref 135–145)

## 2021-05-31 LAB — CBC WITH DIFFERENTIAL (CANCER CENTER ONLY)
Abs Immature Granulocytes: 0.01 10*3/uL (ref 0.00–0.07)
Basophils Absolute: 0 10*3/uL (ref 0.0–0.1)
Basophils Relative: 1 %
Eosinophils Absolute: 0.1 10*3/uL (ref 0.0–0.5)
Eosinophils Relative: 1 %
HCT: 32.6 % — ABNORMAL LOW (ref 36.0–46.0)
Hemoglobin: 10.5 g/dL — ABNORMAL LOW (ref 12.0–15.0)
Immature Granulocytes: 0 %
Lymphocytes Relative: 14 %
Lymphs Abs: 0.8 10*3/uL (ref 0.7–4.0)
MCH: 29.7 pg (ref 26.0–34.0)
MCHC: 32.2 g/dL (ref 30.0–36.0)
MCV: 92.1 fL (ref 80.0–100.0)
Monocytes Absolute: 0.7 10*3/uL (ref 0.1–1.0)
Monocytes Relative: 11 %
Neutro Abs: 4.4 10*3/uL (ref 1.7–7.7)
Neutrophils Relative %: 73 %
Platelet Count: 301 10*3/uL (ref 150–400)
RBC: 3.54 MIL/uL — ABNORMAL LOW (ref 3.87–5.11)
RDW: 20 % — ABNORMAL HIGH (ref 11.5–15.5)
WBC Count: 6 10*3/uL (ref 4.0–10.5)
nRBC: 0 % (ref 0.0–0.2)

## 2021-05-31 LAB — MAGNESIUM: Magnesium: 2 mg/dL (ref 1.7–2.4)

## 2021-05-31 NOTE — Assessment & Plan Note (Signed)
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  Her pathologic staging is pT3 N3B.  Given positive margins and presence of extracapsular extension,she completed 6 weeks of chemoradiation.    Tammy Brady is doing much better than she was a couple weeks ago.  Her hemoglobin has improved to 10.5.  She is not as fatigued.  She does have an occasional shortness of breath with activity however that is related to deconditioning.  Her lung mammogram normal today.  And her weight is up from 1 62-1 67.  Tammy Brady will return in about 6 to 7 weeks for labs port flush and follow-up with Dr. Recoup.  She will undergo repeat imaging at [facility] with her ear nose and throat doctor on July 14, 2021.  She will repeat her chest x-ray on the week of December 5.  I encouraged her to continue with her activity level, walking regularly and slowly increasing her walking time by 1 minute/week.  She will continue to work with physical therapy for her lymphedema.

## 2021-05-31 NOTE — Therapy (Signed)
[facility] @ [place], [place], [address] Phone: [phone]   Fax:  [phone]  Physical Therapy Treatment  Patient Details  Name: Tammy Brady MRN: [id number] Date of Birth: 1951/03/27 Referring Provider (PT): Isidore Moos   Encounter Date: 05/31/2021   PT End of Session - 05/31/21 1056     Visit Number 10    Number of Visits 13    Date for PT Re-Evaluation 06/08/21    PT Start Time 2979   pt arrived late   PT Stop Time 1054    PT Time Calculation (min) 39 min    Activity Tolerance Patient tolerated treatment well    Behavior During Therapy [facility] for tasks assessed/performed             Past Medical History:  Diagnosis Date   Aortic atherosclerosis ([place])    CAD (coronary artery disease)    Mild non-obstructive disease by cath January 2011. Coronary CTA showed  mild (25-49%) plaque in the RCA and minimal (<25%) coronary CTA 04/2021 with coronary Ca score 257   Carotid artery disease (HCC)    Followed by Dr Kellie Simmering   GERD (gastroesophageal reflux disease)    Hyperlipidemia    Hypertension    Hypothyroidism     Past Surgical History:  Procedure Laterality Date   APPENDECTOMY     IR IMAGING GUIDED PORT INSERTION  03/02/2021    There were no vitals filed for this visit.   Subjective Assessment - 05/31/21 1016     Subjective I hope you can do the lymphatic treatment today to get this swelling down by thanksgiving.    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 CT neck same day revealed an enhancing soft tissue lesion along the buccal margin of the left mandible; with lucency in the adjacent bone consistent with known SCC. Also found were necrotic left level IA/B and II/IIIB lymph nodes, noted to be concerning for nodal metastasis. During physical exam performed at this visit, Dr. Amada Jupiter visualized the  exophytic mass in and around the left mandibular canine, premolar, and first molar, along with an enlarged level 1A lymph node. The patient opted to proceed with multiple oral biopsies, mandibular resectioning, and lymphadenectomies, 01/18/21 Resection performed on 01/18/21 revealed the following: 3.3cm tumor, Gr 2 Squamous cell carcinoma of alveolar ridge extending to buccal gingival junction and lingual sulcus, DOI 32mm, neg margins, 6/49 LN+ bilaterally with ECE; no PNI or LVSI; She also underwent Left fibula osteocutaneous free flap and ORIF mandible, will receive 30 fractions of radiation to her oral cavity and bilateral neck and 6 weeks of cisplatin chemotherapy. She started on 8/29 and will complete 04/19/21, PEG placed at [facility] on 01/18/21 and exchanged on 8/5 due to leakage. She uses PEG for most nutrition and medication. She is able to tolerate pureed foods but her reconstructive flap limits her swallowing, PAC placed at [facility] on 03/02/21, reports a 40 pack year smoking history, quitting off and on over the years, stopped smoking in May 2022.    Patient Stated Goals to gain info from providers    Currently in Pain? No/denies    Pain Score 0-No pain                               [facility] Adult PT Treatment/Exercise - 05/31/21 0001  Manual Therapy   Manual Lymphatic Drainage (MLD) in supine with head of bed elevated: short neck, 5 diaphragmatic breaths, bilateral axillary nodes, bilateral pectoral nodes, anterior chest, short neck, posterior, lateral and anterior neck moving fluid towards pathways, submental and submandibular areas moving fluid towards pathways then retracing all steps                          PT Long Term Goals - 05/11/21 1006       PT LONG TERM GOAL #1   Title Pt will be independent in self MLD for long term management of lymphedema.    Baseline 05/11/21- pt has not been able to try this due to increased fatigue and recent illness    Time  6    Period Weeks    Status On-going      PT LONG TERM GOAL #2   Title Pt will obtain appropriate compression garments for long term management of lymphedema.    Time 6    Period Weeks    Status On-going      PT LONG TERM GOAL #3   Title Pt will report she is able to descend steps with a step through gait pattern and a decreased fear of falling.    Time 6    Period Weeks    Status On-going      PT LONG TERM GOAL #4   Title Pt will be independent in a home exercise program for continued stretching and strengthening.    Time 6    Period Weeks    Status On-going      PT LONG TERM GOAL #5   Title Pt will report her shoulder ROM has improved and she is no longer having trouble getting dressed or reaching overhead.    Baseline 05/11/21- pt still has difficulty getting things overhead    Time 6    Period Weeks    Status On-going      PT LONG TERM GOAL #6   Title Pt will be able to complete 13 sit to stands in 30 seconds without use of UEs to decrease fall risk.    Time 6    Period Weeks    Status On-going                   Plan - 05/31/21 1100     Clinical Impression Statement Pt requests to focus on her lymphedema until she is able to obtain the compression pump. She reports she will work on her strengthening and ROM at home if we can focus on her lymphedema in clinic. Continued today spending increased time at chin to reduce swelling and allow pt to close her mouth more easily. Sent an email to [employer] with [employer] to find out about status of pump. Awaiting a response. Pt still with fullness at anterior neck and chin.    PT Frequency 2x / week    PT Duration 6 weeks    PT Treatment/Interventions ADLs/Self Care Home Management;Therapeutic exercise;Balance training;Neuromuscular re-education;Stair training;Gait training;Patient/family education;Orthotic Fit/Training;Manual techniques;Manual lymph drainage;Compression bandaging;Scar mobilization;Passive range of  motion;Taping;Vasopneumatic Device;Joint Manipulations    PT Next Visit Plan cont MLD,  PROM to neck, once lymphedema is managed with garments and pump begin bilateral shoulder ROM and LE strengthening and balance    PT Home Exercise Plan head and neck ROM exercises,Access Code [email or web address]    Consulted and Agree with Plan of Care Patient;Family member/caregiver  Patient will benefit from skilled therapeutic intervention in order to improve the following deficits and impairments:  Postural dysfunction, Impaired UE functional use, Impaired flexibility, Increased fascial restricitons, Decreased strength, Decreased knowledge of use of DME, Decreased knowledge of precautions, Decreased range of motion, Decreased scar mobility, Increased edema, Difficulty walking, Decreased balance  Visit Diagnosis: Lymphedema, not elsewhere classified  Disorder of the skin and subcutaneous tissue related to radiation, unspecified  Aftercare following surgery for neoplasm  Muscle weakness (generalized)  Difficulty in walking, not elsewhere classified  Stiffness of left shoulder, not elsewhere classified  Stiffness of right shoulder, not elsewhere classified  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[place])     Problem List Patient Active Problem List   Diagnosis Date Noted   Symptomatic anemia 05/18/2021   CAP (community acquired pneumonia) 05/17/2021   Normocytic anemia 05/17/2021   Moderate protein-calorie malnutrition ([place]) 05/17/2021   Hyponatremia 05/17/2021   G tube feedings ([place]) 04/29/2021   Aortic atherosclerosis ([place]) 04/25/2021   Chest pain of uncertain etiology 85/88/5027   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 chemotherapy ([place]) 04/07/2021   Mucositis due to antineoplastic therapy 03/31/2021   Drug induced constipation 03/31/2021   Chemotherapy-induced nausea 03/31/2021   Port-A-Cath in place 03/09/2021   Carcinoma of lower gum ([place]) 02/20/2021   Squamous cell carcinoma of oral  mucosa ([place]) 12/08/2020   Gastritis 12/08/2020   Anxiety 12/08/2020   Need for prophylactic vaccination and inoculation against influenza 07/30/2020   Depression 11/26/2015   GERD (gastroesophageal reflux disease) 11/26/2015   Migraine headache 11/26/2015   Screening for diabetes mellitus 09/03/2015   Hypothyroidism 07/22/2009   Mixed hyperlipidemia 07/22/2009   Tobacco user 07/22/2009   Hypertension, essential 07/22/2009   CAD, NATIVE VESSEL 07/22/2009   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   Coronary artery spasm ([place]) 07/22/2009    Allyson Sabal Ferris, PT 05/31/2021, 11:03 AM  [facility] @ [place], [place], [address] Phone: [phone]   Fax:  [phone]  Name: CHANIECE BARBATO MRN: [id number] Date of Birth: [birth year]   Manus Gunning, PT 05/31/21 11:03 AM

## 2021-05-31 NOTE — Progress Notes (Signed)
[facility] Follow up:    Tammy Duncans, PA-C [address] [place] [address]   DIAGNOSIS:  Cancer Staging  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[place]) Staging form: Oral Cavity, AJCC 8th Edition - Pathologic stage from 02/19/2021: Stage IVB (pT3, pN3b, cM0) - Signed by Eppie Gibson, MD on 02/20/2021 Stage prefix: Initial diagnosis   SUMMARY OF ONCOLOGIC HISTORY: Oncology History  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[place])  12/08/2020 Initial Diagnosis   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[place])   01/18/2021 Surgery   status post surgery on January 18, 2021 with pathology showing a 3.3 cm tumor, grade 2 squamous cell carcinoma of the alveolar ridge, negative margins, 6 out of 49 lymph nodes positive bilaterally with extracapsular extension referred to medical oncology for consideration of adjuvant chemotherapy.  Her pathologic staging is pT3 N3B.   02/19/2021 Cancer Staging   Staging form: Oral Cavity, AJCC 8th Edition - Pathologic stage from 02/19/2021: Stage IVB (pT3, pN3b, cM0) - Signed by Eppie Gibson, MD on 02/20/2021 Stage prefix: Initial diagnosis    03/08/2021 - 04/19/2021 Radiation Therapy   Radiation therapy given concurrently with weekly Cisplatin   03/10/2021 - 04/15/2021 Chemotherapy   Patient is on Treatment Plan :  HEAD/NECK Cisplatin q7d  x 6 cycles     CURRENT THERAPY: s/p chemoradiation  INTERVAL HISTORY: Tammy Brady 70 y.o. female returns for evaluation and follow-up of her head neck cancer diagnoses.  When I saw her a couple weeks ago she was feeling poorly, concerned with her persistent anemia and fatigue.  Over the past couple weeks she has started feeling better.  She saw Dr. Amada Jupiter at [facility] ear nose and throat, and they noted no sign of cancer recurrence.  Tammy Brady also has been having headaches.  She notes that since our last visit these have improved and she has not had any.  She is going to undergo imaging on July 14, 2021 with  a CT head neck and chest.  She will be due for repeat chest x-ray the week of December 5.  She denies any significant difficulty swallowing.  She continues to see physical therapy for lymphedema exercises.  She has had no concerns for infection or sick contacts.  Overall she is feeling much better than she did a couple weeks ago.   Patient Active Problem List   Diagnosis Date Noted   Symptomatic anemia 05/18/2021   CAP (community acquired pneumonia) 05/17/2021   Normocytic anemia 05/17/2021   Moderate protein-calorie malnutrition ([place]) 05/17/2021   Hyponatremia 05/17/2021   G tube feedings ([place]) 04/29/2021   Aortic atherosclerosis ([place]) 04/25/2021   Chest pain of uncertain etiology 99/24/2683   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 chemotherapy ([place]) 04/07/2021   Mucositis due to antineoplastic therapy 03/31/2021   Drug induced constipation 03/31/2021   Chemotherapy-induced nausea 03/31/2021   Port-A-Cath in place 03/09/2021   Carcinoma of lower gum ([place]) 02/20/2021   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[place]) 12/08/2020   Gastritis 12/08/2020   Anxiety 12/08/2020   Need for prophylactic vaccination and inoculation against influenza 07/30/2020   Depression 11/26/2015   GERD (gastroesophageal reflux disease) 11/26/2015   Migraine headache 11/26/2015   Screening for diabetes mellitus 09/03/2015   Hypothyroidism 07/22/2009   Mixed hyperlipidemia 07/22/2009   Tobacco user 07/22/2009   Hypertension, essential 07/22/2009   CAD, NATIVE VESSEL 07/22/2009   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   Coronary artery spasm ([place]) 07/22/2009  is allergic to codeine and oxycodone.  MEDICAL HISTORY: Past Medical History:  Diagnosis Date   Aortic atherosclerosis ([place])    CAD (coronary artery disease)    Mild non-obstructive disease by cath January 2011. Coronary CTA showed  mild (25-49%) plaque in the RCA and minimal (<25%) coronary CTA 04/2021 with coronary Ca score 257   Carotid artery  disease (HCC)    Followed by Dr Kellie Simmering   GERD (gastroesophageal reflux disease)    Hyperlipidemia    Hypertension    Hypothyroidism     SURGICAL HISTORY: Past Surgical History:  Procedure Laterality Date   APPENDECTOMY     IR IMAGING GUIDED PORT INSERTION  03/02/2021    SOCIAL HISTORY: Social History   Socioeconomic History   Marital status: Married    Spouse name: Not on file   Number of children: 1   Years of education: Not on file   Highest education level: Not on file  Occupational History   Occupation: Field seismologist: [employer]  Tobacco Use   Smoking status: Former    Packs/day: 1.00    Years: 40.00    Pack years: 40.00    Types: Cigarettes    Quit date: 05/11/2009    Years since quitting: 12.0   Smokeless tobacco: Never  Substance and Sexual Activity   Alcohol use: Not Currently   Drug use: No   Sexual activity: Not Currently  Other Topics Concern   Not on file  Social History Narrative   Not on file   Social Determinants of Health   Financial Resource Strain: Low Risk    Difficulty of Paying Living Expenses: Not hard at all  Food Insecurity: No Food Insecurity   Worried About Charity fundraiser in the Last Year: Never true   [place] in the Last Year: Never true  Transportation Needs: No Transportation Needs   Lack of Transportation (Medical): No   Lack of Transportation (Non-Medical): No  Physical Activity: Inactive   Days of Exercise per Week: 0 days   Minutes of Exercise per Session: 0 min  Stress: No Stress Concern Present   Feeling of Stress : Only a little  Social Connections: Engineer, building services of Communication with Friends and Family: More than three times a week   Frequency of Social Gatherings with Friends and Family: More than three times a week   Attends Religious Services: More than 4 times per year   Active Member of [employer] or Organizations: Yes   Attends Music therapist: More than 4  times per year   Marital Status: Married  Human resources officer Violence: Not At Risk   Fear of Current or Ex-Partner: No   Emotionally Abused: No   Physically Abused: No   Sexually Abused: No    FAMILY HISTORY: Family History  Problem Relation Age of Onset   Cancer Mother        esophageal   Heart disease Father    Coronary artery disease Father    Hyperlipidemia Sister    Hyperlipidemia Sister    Hyperlipidemia Brother    Hyperlipidemia Brother    Hyperlipidemia Brother    Coronary artery disease Maternal Grandmother     Review of Systems  Constitutional:  Negative for appetite change, chills, fatigue, fever and unexpected weight change.  HENT:   Negative for hearing loss, lump/mass and trouble swallowing.   Eyes:  Negative for eye problems and icterus.  Respiratory:  Negative for  chest tightness, cough and shortness of breath.   Cardiovascular:  Negative for chest pain, leg swelling and palpitations.  Gastrointestinal:  Negative for abdominal distention, abdominal pain, constipation, diarrhea, nausea and vomiting.  Endocrine: Negative for hot flashes.  Genitourinary:  Negative for difficulty urinating.   Musculoskeletal:  Negative for arthralgias.  Skin:  Negative for itching and rash.  Neurological:  Negative for dizziness, extremity weakness, headaches and numbness.  Hematological:  Negative for adenopathy. Does not bruise/bleed easily.  Psychiatric/Behavioral:  Negative for depression. The patient is not nervous/anxious.      PHYSICAL EXAMINATION  ECOG PERFORMANCE STATUS: 1 - Symptomatic but completely ambulatory  Vitals:   05/31/21 0917  BP: (!) 116/47  Pulse: 79  Resp: 18  Temp: 98.9 F (37.2 C)  SpO2: 96%    Physical Exam Constitutional:      General: She is not in acute distress.    Appearance: Normal appearance. She is not toxic-appearing.  HENT:     Head: Normocephalic and atraumatic.  Eyes:     General: No scleral icterus. Cardiovascular:      Rate and Rhythm: Normal rate and regular rhythm.     Pulses: Normal pulses.     Heart sounds: Normal heart sounds.  Pulmonary:     Effort: Pulmonary effort is normal.     Breath sounds: Normal breath sounds.  Abdominal:     General: Abdomen is flat. Bowel sounds are normal. There is no distension.     Palpations: Abdomen is soft.     Tenderness: There is no abdominal tenderness.  Musculoskeletal:        General: No swelling.     Cervical back: Neck supple.  Lymphadenopathy:     Cervical: No cervical adenopathy.  Skin:    General: Skin is warm and dry.     Findings: No rash.  Neurological:     General: No focal deficit present.     Mental Status: She is alert.  Psychiatric:        Mood and Affect: Mood normal.        Behavior: Behavior normal.    LABORATORY DATA:  CBC    Component Value Date/Time   WBC 6.0 05/31/2021 0851   WBC 4.7 05/18/2021 0516   RBC 3.54 (L) 05/31/2021 0851   HGB 10.5 (L) 05/31/2021 0851   HGB 10.0 (L) 04/20/2021 1356   HCT 32.6 (L) 05/31/2021 0851   HCT 30.9 (L) 04/20/2021 1356   PLT 301 05/31/2021 0851   PLT 218 04/20/2021 1356   MCV 92.1 05/31/2021 0851   MCV 87 04/20/2021 1356   MCH 29.7 05/31/2021 0851   MCHC 32.2 05/31/2021 0851   RDW 20.0 (H) 05/31/2021 0851   RDW 16.0 (H) 04/20/2021 1356   LYMPHSABS 0.8 05/31/2021 0851   LYMPHSABS 0.2 (L) 04/20/2021 1356   MONOABS 0.7 05/31/2021 0851   EOSABS 0.1 05/31/2021 0851   EOSABS 0.0 04/20/2021 1356   BASOSABS 0.0 05/31/2021 0851   BASOSABS 0.0 04/20/2021 1356    CMP     Component Value Date/Time   NA 138 05/31/2021 0851   NA 134 04/20/2021 1356   K 4.2 05/31/2021 0851   CL 102 05/31/2021 0851   CO2 27 05/31/2021 0851   GLUCOSE 137 (H) 05/31/2021 0851   BUN 19 05/31/2021 0851   BUN 21 04/20/2021 1356   CREATININE 0.73 05/31/2021 0851   CALCIUM 9.4 05/31/2021 0851   PROT 6.9 05/17/2021 0520   PROT 6.4 04/20/2021 1356  ALBUMIN 2.7 (L) 05/17/2021 0520   ALBUMIN 3.4 (L) 04/20/2021  1356   AST 26 05/17/2021 0520   AST 19 05/07/2021 1057   ALT 14 05/17/2021 0520   ALT 17 05/07/2021 1057   ALKPHOS 71 05/17/2021 0520   BILITOT 0.4 05/17/2021 0520   BILITOT 0.3 05/07/2021 1057   GFRNONAA >60 05/31/2021 0851   GFRAA 83 08/26/2020 0927      ASSESSMENT and PLAN:   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[place]) This is a very pleasant 70 year old female patient with new diagnosis of squamous cell carcinoma of the oral cavity status post surgery on January 18, 2021 with pathology showing a 3.3 cm tumor, grade 2 squamous cell carcinoma of the alveolar ridge, negative margins, 6 out of 49 lymph nodes positive bilaterally with extracapsular extension referred to medical oncology for consideration of adjuvant chemotherapy.  Her pathologic staging is pT3 N3B.  Given positive margins and presence of extracapsular extension,she completed 6 weeks of chemoradiation.    Mauria is doing much better than she was a couple weeks ago.  Her hemoglobin has improved to 10.5.  She is not as fatigued.  She does have an occasional shortness of breath with activity however that is related to deconditioning.  Her lung mammogram normal today.  And her weight is up from 1 62-1 67.  Shelanda will return in about 6 to 7 weeks for labs port flush and follow-up with Dr. Recoup.  She will undergo repeat imaging at [facility] with her ear nose and throat doctor on July 14, 2021.  She will repeat her chest x-ray on the week of December 5.  I encouraged her to continue with her activity level, walking regularly and slowly increasing her walking time by 1 minute/week.  She will continue to work with physical therapy for her lymphedema.  All questions were answered. The patient knows to call the clinic with any problems, questions or concerns. We can certainly see the patient much sooner if necessary.  Total encounter time: 20 minutes in face-to-face visit time, chart review, lab review, care coordination, and documentation  of the encounter.  Wilber Bihari, NP 05/31/21 10:07 AM Medical Oncology and Hematology [facility] [place], [place] [address] Tel. [phone]    Fax. [phone]  *Total Encounter Time as defined by the Centers for Medicare and Medicaid Services includes, in addition to the face-to-face time of a patient visit (documented in the note above) non-face-to-face time: obtaining and reviewing outside history, ordering and reviewing medications, tests or procedures, care coordination (communications with other health care professionals or caregivers) and documentation in the medical record.

## 2021-06-01 ENCOUNTER — Telehealth: Payer: Self-pay | Admitting: Physician Assistant

## 2021-06-01 NOTE — Telephone Encounter (Signed)
   Tammy Brady has been scheduled for the following appointment:  WHAT: MAMMOGRAM & BONE DENSITY WHERE: [facility] DATE: 09/01/21  TIME: 12:10PM ARRIVAL TIME  Patient has been made aware.

## 2021-06-02 ENCOUNTER — Other Ambulatory Visit: Payer: Self-pay

## 2021-06-02 ENCOUNTER — Ambulatory Visit: Payer: Medicare Other

## 2021-06-02 DIAGNOSIS — C060 Malignant neoplasm of cheek mucosa: Secondary | ICD-10-CM | POA: Diagnosis not present

## 2021-06-02 DIAGNOSIS — I890 Lymphedema, not elsewhere classified: Secondary | ICD-10-CM

## 2021-06-02 DIAGNOSIS — L599 Disorder of the skin and subcutaneous tissue related to radiation, unspecified: Secondary | ICD-10-CM

## 2021-06-02 DIAGNOSIS — M6281 Muscle weakness (generalized): Secondary | ICD-10-CM

## 2021-06-02 DIAGNOSIS — R262 Difficulty in walking, not elsewhere classified: Secondary | ICD-10-CM

## 2021-06-02 DIAGNOSIS — K1231 Oral mucositis (ulcerative) due to antineoplastic therapy: Secondary | ICD-10-CM | POA: Diagnosis not present

## 2021-06-02 DIAGNOSIS — T451X5D Adverse effect of antineoplastic and immunosuppressive drugs, subsequent encounter: Secondary | ICD-10-CM | POA: Diagnosis not present

## 2021-06-02 DIAGNOSIS — R471 Dysarthria and anarthria: Secondary | ICD-10-CM

## 2021-06-02 DIAGNOSIS — Z483 Aftercare following surgery for neoplasm: Secondary | ICD-10-CM

## 2021-06-02 DIAGNOSIS — M25612 Stiffness of left shoulder, not elsewhere classified: Secondary | ICD-10-CM

## 2021-06-02 DIAGNOSIS — D649 Anemia, unspecified: Secondary | ICD-10-CM | POA: Diagnosis not present

## 2021-06-02 DIAGNOSIS — R1311 Dysphagia, oral phase: Secondary | ICD-10-CM

## 2021-06-02 DIAGNOSIS — M25611 Stiffness of right shoulder, not elsewhere classified: Secondary | ICD-10-CM

## 2021-06-02 DIAGNOSIS — Z87891 Personal history of nicotine dependence: Secondary | ICD-10-CM | POA: Diagnosis not present

## 2021-06-02 DIAGNOSIS — E039 Hypothyroidism, unspecified: Secondary | ICD-10-CM | POA: Diagnosis not present

## 2021-06-02 NOTE — Therapy (Signed)
[place] @ [facility] [place], [place], [address] Phone: [phone]   Fax:  [phone]  Physical Therapy Treatment  Patient Details  Name: Tammy Brady MRN: [id number] Date of Birth: [birth year] Referring Provider (PT): Reita May Date: 06/02/2021   PT End of Session - 06/02/21 1103     Visit Number 11    Number of Visits 13    Date for PT Re-Evaluation 06/08/21    PT Start Time 1001    PT Stop Time 1057    PT Time Calculation (min) 56 min    Activity Tolerance Patient tolerated treatment well    Behavior During Therapy [facility] for tasks assessed/performed             Past Medical History:  Diagnosis Date   Aortic atherosclerosis ([place])    CAD (coronary artery disease)    Mild non-obstructive disease by cath January 2011. Coronary CTA showed  mild (25-49%) plaque in the RCA and minimal (<25%) coronary CTA 04/2021 with coronary Ca score 257   Carotid artery disease (HCC)    Followed by Dr Kellie Simmering   GERD (gastroesophageal reflux disease)    Hyperlipidemia    Hypertension    Hypothyroidism     Past Surgical History:  Procedure Laterality Date   APPENDECTOMY     IR IMAGING GUIDED PORT INSERTION  03/02/2021    There were no vitals filed for this visit.   Subjective Assessment - 06/02/21 1003     Subjective The  treatment helped to reduce my swelling.  I am seeing family on Thanksgiving so I especially want the chin swelling to go down.    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 CT neck same day revealed an enhancing soft tissue lesion along the buccal margin of the left mandible; with lucency in the adjacent bone consistent with known SCC. Also found were necrotic left level IA/B and II/IIIB lymph nodes, noted to be concerning for nodal metastasis. During physical exam performed at this visit, Dr.  Amada Jupiter visualized the exophytic mass in and around the left mandibular canine, premolar, and first molar, along with an enlarged level 1A lymph node. The patient opted to proceed with multiple oral biopsies, mandibular resectioning, and lymphadenectomies, 01/18/21 Resection performed on 01/18/21 revealed the following: 3.3cm tumor, Gr 2 Squamous cell carcinoma of alveolar ridge extending to buccal gingival junction and lingual sulcus, DOI 52mm, neg margins, 6/49 LN+ bilaterally with ECE; no PNI or LVSI; She also underwent Left fibula osteocutaneous free flap and ORIF mandible, will receive 30 fractions of radiation to her oral cavity and bilateral neck and 6 weeks of cisplatin chemotherapy. She started on 8/29 and will complete 04/19/21, PEG placed at [facility] on 01/18/21 and exchanged on 8/5 due to leakage. She uses PEG for most nutrition and medication. She is able to tolerate pureed foods but her reconstructive flap limits her swallowing, PAC placed at [facility] on 03/02/21, reports a 40 pack year smoking history, quitting off and on over the years, stopped smoking in May 2022.    Currently in Pain? No/denies    Pain Score 0-No pain                               [facility] Adult PT Treatment/Exercise - 06/02/21 0001       Manual  Therapy   Manual Lymphatic Drainage (MLD) in supine with head of bed elevated: short neck, 5 diaphragmatic breaths, bilateral axillary nodes, bilateral pectoral nodes, anterior chest, short neck, posterior, lateral and anterior neck moving fluid towards pathways, submental and submandibular areas moving fluid towards pathways then retracing all steps                          PT Long Term Goals - 05/11/21 1006       PT LONG TERM GOAL #1   Title Pt will be independent in self MLD for long term management of lymphedema.    Baseline 05/11/21- pt has not been able to try this due to increased fatigue and recent illness    Time 6    Period Weeks    Status  On-going      PT LONG TERM GOAL #2   Title Pt will obtain appropriate compression garments for long term management of lymphedema.    Time 6    Period Weeks    Status On-going      PT LONG TERM GOAL #3   Title Pt will report she is able to descend steps with a step through gait pattern and a decreased fear of falling.    Time 6    Period Weeks    Status On-going      PT LONG TERM GOAL #4   Title Pt will be independent in a home exercise program for continued stretching and strengthening.    Time 6    Period Weeks    Status On-going      PT LONG TERM GOAL #5   Title Pt will report her shoulder ROM has improved and she is no longer having trouble getting dressed or reaching overhead.    Baseline 05/11/21- pt still has difficulty getting things overhead    Time 6    Period Weeks    Status On-going      PT LONG TERM GOAL #6   Title Pt will be able to complete 13 sit to stands in 30 seconds without use of UEs to decrease fall risk.    Time 6    Period Weeks    Status On-going                   Plan - 06/02/21 1104     Clinical Impression Statement Pt continues with fullness at anterior neck and chin but was able to close mouth more fully at completion of rx. Pt is anxious to hear about flexitouch.    Stability/Clinical Decision Making Stable/Uncomplicated    Rehab Potential Good    PT Frequency 2x / week    PT Duration 6 weeks    PT Treatment/Interventions ADLs/Self Care Home Management;Therapeutic exercise;Balance training;Neuromuscular re-education;Stair training;Gait training;Patient/family education;Orthotic Fit/Training;Manual techniques;Manual lymph drainage;Compression bandaging;Scar mobilization;Passive range of motion;Taping;Vasopneumatic Device;Joint Manipulations    PT Next Visit Plan cont MLD,  PROM to neck, once lymphedema is managed with garments and pump begin bilateral shoulder ROM and LE strengthening and balance    PT Home Exercise Plan head and neck  ROM exercises,Access Code [email or web address]    Consulted and Agree with Plan of Care Patient             Patient will benefit from skilled therapeutic intervention in order to improve the following deficits and impairments:  Postural dysfunction, Impaired UE functional use, Impaired flexibility, Increased fascial restricitons, Decreased strength, Decreased knowledge of use of DME,  Decreased knowledge of precautions, Decreased range of motion, Decreased scar mobility, Increased edema, Difficulty walking, Decreased balance  Visit Diagnosis: Lymphedema, not elsewhere classified  Disorder of the skin and subcutaneous tissue related to radiation, unspecified  Aftercare following surgery for neoplasm  Muscle weakness (generalized)  Difficulty in walking, not elsewhere classified  Stiffness of left shoulder, not elsewhere classified  Stiffness of right shoulder, not elsewhere classified  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HCC)  Dysphagia, oral phase  Dysarthria and anarthria     Problem List Patient Active Problem List   Diagnosis Date Noted   Symptomatic anemia 05/18/2021   CAP (community acquired pneumonia) 05/17/2021   Normocytic anemia 05/17/2021   Moderate protein-calorie malnutrition ([place]) 05/17/2021   Hyponatremia 05/17/2021   G tube feedings ([place]) 04/29/2021   Aortic atherosclerosis ([place]) 04/25/2021   Chest pain of uncertain etiology 70/78/6754   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 chemotherapy ([place]) 04/07/2021   Mucositis due to antineoplastic therapy 03/31/2021   Drug induced constipation 03/31/2021   Chemotherapy-induced nausea 03/31/2021   Port-A-Cath in place 03/09/2021   Carcinoma of lower gum ([place]) 02/20/2021   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[place]) 12/08/2020   Gastritis 12/08/2020   Anxiety 12/08/2020   Need for prophylactic vaccination and inoculation against influenza 07/30/2020   Depression 11/26/2015   GERD (gastroesophageal reflux disease) 11/26/2015    Migraine headache 11/26/2015   Screening for diabetes mellitus 09/03/2015   Hypothyroidism 07/22/2009   Mixed hyperlipidemia 07/22/2009   Tobacco user 07/22/2009   Hypertension, essential 07/22/2009   CAD, NATIVE VESSEL 07/22/2009   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   Coronary artery spasm ([place]) 07/22/2009    Claris Pong, PT 06/02/2021, 11:08 AM  [place] @ [place], [place], [address] Phone: [phone]   Fax:  [phone]  Name: Tammy Brady MRN: [id number] Date of Birth: 02-Mar-1951

## 2021-06-06 DIAGNOSIS — R1310 Dysphagia, unspecified: Secondary | ICD-10-CM | POA: Diagnosis not present

## 2021-06-06 DIAGNOSIS — Z780 Asymptomatic menopausal state: Secondary | ICD-10-CM | POA: Insufficient documentation

## 2021-06-06 DIAGNOSIS — Z1231 Encounter for screening mammogram for malignant neoplasm of breast: Secondary | ICD-10-CM | POA: Insufficient documentation

## 2021-06-06 DIAGNOSIS — Z0001 Encounter for general adult medical examination with abnormal findings: Secondary | ICD-10-CM | POA: Insufficient documentation

## 2021-06-06 DIAGNOSIS — C060 Malignant neoplasm of cheek mucosa: Secondary | ICD-10-CM | POA: Diagnosis not present

## 2021-06-06 DIAGNOSIS — Z23 Encounter for immunization: Secondary | ICD-10-CM | POA: Insufficient documentation

## 2021-06-06 NOTE — Assessment & Plan Note (Signed)
Order mammogram °

## 2021-06-06 NOTE — Assessment & Plan Note (Signed)
Education given. 

## 2021-06-06 NOTE — Assessment & Plan Note (Signed)
Order dexa.  

## 2021-06-06 NOTE — Assessment & Plan Note (Signed)
Fluad given. 

## 2021-06-07 ENCOUNTER — Encounter: Payer: Self-pay | Admitting: Physical Therapy

## 2021-06-07 ENCOUNTER — Other Ambulatory Visit: Payer: Self-pay

## 2021-06-07 ENCOUNTER — Ambulatory Visit: Payer: Medicare Other | Admitting: Physical Therapy

## 2021-06-07 DIAGNOSIS — Z483 Aftercare following surgery for neoplasm: Secondary | ICD-10-CM

## 2021-06-07 DIAGNOSIS — M6281 Muscle weakness (generalized): Secondary | ICD-10-CM

## 2021-06-07 DIAGNOSIS — K1231 Oral mucositis (ulcerative) due to antineoplastic therapy: Secondary | ICD-10-CM | POA: Diagnosis not present

## 2021-06-07 DIAGNOSIS — R262 Difficulty in walking, not elsewhere classified: Secondary | ICD-10-CM

## 2021-06-07 DIAGNOSIS — C060 Malignant neoplasm of cheek mucosa: Secondary | ICD-10-CM

## 2021-06-07 DIAGNOSIS — I890 Lymphedema, not elsewhere classified: Secondary | ICD-10-CM

## 2021-06-07 DIAGNOSIS — T451X5D Adverse effect of antineoplastic and immunosuppressive drugs, subsequent encounter: Secondary | ICD-10-CM | POA: Diagnosis not present

## 2021-06-07 DIAGNOSIS — L599 Disorder of the skin and subcutaneous tissue related to radiation, unspecified: Secondary | ICD-10-CM

## 2021-06-07 DIAGNOSIS — M25611 Stiffness of right shoulder, not elsewhere classified: Secondary | ICD-10-CM

## 2021-06-07 DIAGNOSIS — Z87891 Personal history of nicotine dependence: Secondary | ICD-10-CM | POA: Diagnosis not present

## 2021-06-07 DIAGNOSIS — D649 Anemia, unspecified: Secondary | ICD-10-CM | POA: Diagnosis not present

## 2021-06-07 DIAGNOSIS — M25612 Stiffness of left shoulder, not elsewhere classified: Secondary | ICD-10-CM

## 2021-06-07 DIAGNOSIS — E039 Hypothyroidism, unspecified: Secondary | ICD-10-CM | POA: Diagnosis not present

## 2021-06-07 NOTE — Therapy (Signed)
[place] @ [place], [place], [address] Phone: [phone]   Fax:  [phone]  Physical Therapy Treatment  Patient Details  Name: Tammy Brady MRN: [id number] Date of Birth: 1951/03/03 Referring Provider (PT): Reita May Date: 06/07/2021   PT End of Session - 06/07/21 1455     Visit Number 12    Number of Visits 13    Date for PT Re-Evaluation 06/08/21    PT Start Time 1409    PT Stop Time 0350    PT Time Calculation (min) 44 min    Activity Tolerance Patient tolerated treatment well    Behavior During Therapy [facility] for tasks assessed/performed             Past Medical History:  Diagnosis Date   Aortic atherosclerosis ([place])    CAD (coronary artery disease)    Mild non-obstructive disease by cath January 2011. Coronary CTA showed  mild (25-49%) plaque in the RCA and minimal (<25%) coronary CTA 04/2021 with coronary Ca score 257   Carotid artery disease (HCC)    Followed by Dr Kellie Simmering   GERD (gastroesophageal reflux disease)    Hyperlipidemia    Hypertension    Hypothyroidism     Past Surgical History:  Procedure Laterality Date   APPENDECTOMY     IR IMAGING GUIDED PORT INSERTION  03/02/2021    There were no vitals filed for this visit.   Subjective Assessment - 06/07/21 1411     Subjective The swelling does good when I come in and get the massages done.    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 CT neck same day revealed an enhancing soft tissue lesion along the buccal margin of the left mandible; with lucency in the adjacent bone consistent with known SCC. Also found were necrotic left level IA/B and II/IIIB lymph nodes, noted to be concerning for nodal metastasis. During physical exam performed at this visit, Dr. Amada Jupiter visualized the exophytic mass in and around the left mandibular  canine, premolar, and first molar, along with an enlarged level 1A lymph node. The patient opted to proceed with multiple oral biopsies, mandibular resectioning, and lymphadenectomies, 01/18/21 Resection performed on 01/18/21 revealed the following: 3.3cm tumor, Gr 2 Squamous cell carcinoma of alveolar ridge extending to buccal gingival junction and lingual sulcus, DOI 70mm, neg margins, 6/49 LN+ bilaterally with ECE; no PNI or LVSI; She also underwent Left fibula osteocutaneous free flap and ORIF mandible, will receive 30 fractions of radiation to her oral cavity and bilateral neck and 6 weeks of cisplatin chemotherapy. She started on 8/29 and will complete 04/19/21, PEG placed at [facility] on 01/18/21 and exchanged on 8/5 due to leakage. She uses PEG for most nutrition and medication. She is able to tolerate pureed foods but her reconstructive flap limits her swallowing, PAC placed at [facility] on 03/02/21, reports a 40 pack year smoking history, quitting off and on over the years, stopped smoking in May 2022.    Patient Stated Goals to gain info from providers    Currently in Pain? No/denies    Pain Score 0-No pain                               [facility] Adult PT Treatment/Exercise - 06/07/21 0001       Manual Therapy  Manual Lymphatic Drainage (MLD) in supine with head of bed elevated: short neck, 5 diaphragmatic breaths, bilateral axillary nodes, bilateral pectoral nodes, anterior chest, short neck, posterior, lateral and anterior neck moving fluid towards pathways, submental and submandibular areas moving fluid towards pathways then retracing all steps                          PT Long Term Goals - 05/11/21 1006       PT LONG TERM GOAL #1   Title Pt will be independent in self MLD for long term management of lymphedema.    Baseline 05/11/21- pt has not been able to try this due to increased fatigue and recent illness    Time 6    Period Weeks    Status On-going      PT  LONG TERM GOAL #2   Title Pt will obtain appropriate compression garments for long term management of lymphedema.    Time 6    Period Weeks    Status On-going      PT LONG TERM GOAL #3   Title Pt will report she is able to descend steps with a step through gait pattern and a decreased fear of falling.    Time 6    Period Weeks    Status On-going      PT LONG TERM GOAL #4   Title Pt will be independent in a home exercise program for continued stretching and strengthening.    Time 6    Period Weeks    Status On-going      PT LONG TERM GOAL #5   Title Pt will report her shoulder ROM has improved and she is no longer having trouble getting dressed or reaching overhead.    Baseline 05/11/21- pt still has difficulty getting things overhead    Time 6    Period Weeks    Status On-going      PT LONG TERM GOAL #6   Title Pt will be able to complete 13 sit to stands in 30 seconds without use of UEs to decrease fall risk.    Time 6    Period Weeks    Status On-going                   Plan - 06/07/21 1456     Clinical Impression Statement Continued with MLD to anterior neck with extra focus spent on chin to decrease swelling and allow pt to close her mouth more easily. Pt received a denial letter from [facility] for the compression pump. She is planning on purchasing the pump out of pocket for long term management of lymphedema. Some softening of edema noted by end of session.    PT Frequency 2x / week    PT Duration 6 weeks    PT Treatment/Interventions ADLs/Self Care Home Management;Therapeutic exercise;Balance training;Neuromuscular re-education;Stair training;Gait training;Patient/family education;Orthotic Fit/Training;Manual techniques;Manual lymph drainage;Compression bandaging;Scar mobilization;Passive range of motion;Taping;Vasopneumatic Device;Joint Manipulations    PT Next Visit Plan cont MLD,  PROM to neck, once lymphedema is managed with garments and pump begin bilateral  shoulder ROM and LE strengthening and balance    PT Home Exercise Plan head and neck ROM exercises,Access Code [email or web address]    Consulted and Agree with Plan of Care Patient             Patient will benefit from skilled therapeutic intervention in order to improve the following deficits and impairments:  Postural dysfunction, Impaired  UE functional use, Impaired flexibility, Increased fascial restricitons, Decreased strength, Decreased knowledge of use of DME, Decreased knowledge of precautions, Decreased range of motion, Decreased scar mobility, Increased edema, Difficulty walking, Decreased balance  Visit Diagnosis: Lymphedema, not elsewhere classified  Disorder of the skin and subcutaneous tissue related to radiation, unspecified  Aftercare following surgery for neoplasm  Muscle weakness (generalized)  Difficulty in walking, not elsewhere classified  Stiffness of left shoulder, not elsewhere classified  Stiffness of right shoulder, not elsewhere classified  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[place])     Problem List Patient Active Problem List   Diagnosis Date Noted   Encounter for general adult medical examination with abnormal findings 06/06/2021   Encounter for osteoporosis screening in asymptomatic postmenopausal patient 06/06/2021   Visit for screening mammogram 06/06/2021   Need for immunization against influenza 06/06/2021   Symptomatic anemia 05/18/2021   CAP (community acquired pneumonia) 05/17/2021   Normocytic anemia 05/17/2021   Moderate protein-calorie malnutrition ([place]) 05/17/2021   Hyponatremia 05/17/2021   G tube feedings ([place]) 04/29/2021   Aortic atherosclerosis ([place]) 04/25/2021   Chest pain of uncertain etiology 77/41/2878   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 chemotherapy ([place]) 04/07/2021   Mucositis due to antineoplastic therapy 03/31/2021   Drug induced constipation 03/31/2021   Chemotherapy-induced nausea 03/31/2021   Port-A-Cath in place 03/09/2021    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   Carcinoma of lower gum ([place]) 02/20/2021   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[place]) 12/08/2020   Gastritis 12/08/2020   Anxiety 12/08/2020   Need for prophylactic vaccination and inoculation against influenza 07/30/2020   Depression 11/26/2015   GERD (gastroesophageal reflux disease) 11/26/2015   Migraine headache 11/26/2015   Screening for diabetes mellitus 09/03/2015   Hypothyroidism 07/22/2009   Mixed hyperlipidemia 07/22/2009   Hypertension, essential 07/22/2009   CAD, NATIVE VESSEL 07/22/2009   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   Coronary artery spasm ([place]) 07/22/2009    Allyson Sabal [place], PT 06/07/2021, 2:58 PM  [place] @ [place], [place], [address] Phone: [phone]   Fax:  [phone]  Name: Tammy Brady MRN: [id number] Date of Birth: December 18, 1950   Manus Gunning, PT 06/07/21 2:58 PM

## 2021-06-09 ENCOUNTER — Other Ambulatory Visit: Payer: Self-pay

## 2021-06-09 ENCOUNTER — Ambulatory Visit: Payer: Medicare Other | Admitting: Physical Therapy

## 2021-06-09 ENCOUNTER — Encounter: Payer: Self-pay | Admitting: Physical Therapy

## 2021-06-09 DIAGNOSIS — M25611 Stiffness of right shoulder, not elsewhere classified: Secondary | ICD-10-CM

## 2021-06-09 DIAGNOSIS — M6281 Muscle weakness (generalized): Secondary | ICD-10-CM

## 2021-06-09 DIAGNOSIS — E039 Hypothyroidism, unspecified: Secondary | ICD-10-CM | POA: Diagnosis not present

## 2021-06-09 DIAGNOSIS — Z483 Aftercare following surgery for neoplasm: Secondary | ICD-10-CM

## 2021-06-09 DIAGNOSIS — D649 Anemia, unspecified: Secondary | ICD-10-CM | POA: Diagnosis not present

## 2021-06-09 DIAGNOSIS — I890 Lymphedema, not elsewhere classified: Secondary | ICD-10-CM | POA: Diagnosis not present

## 2021-06-09 DIAGNOSIS — K1231 Oral mucositis (ulcerative) due to antineoplastic therapy: Secondary | ICD-10-CM | POA: Diagnosis not present

## 2021-06-09 DIAGNOSIS — Z87891 Personal history of nicotine dependence: Secondary | ICD-10-CM | POA: Diagnosis not present

## 2021-06-09 DIAGNOSIS — R262 Difficulty in walking, not elsewhere classified: Secondary | ICD-10-CM

## 2021-06-09 DIAGNOSIS — C060 Malignant neoplasm of cheek mucosa: Secondary | ICD-10-CM

## 2021-06-09 DIAGNOSIS — M25612 Stiffness of left shoulder, not elsewhere classified: Secondary | ICD-10-CM

## 2021-06-09 DIAGNOSIS — T451X5D Adverse effect of antineoplastic and immunosuppressive drugs, subsequent encounter: Secondary | ICD-10-CM | POA: Diagnosis not present

## 2021-06-09 DIAGNOSIS — L599 Disorder of the skin and subcutaneous tissue related to radiation, unspecified: Secondary | ICD-10-CM

## 2021-06-09 NOTE — Therapy (Signed)
[facility] @ [place], [place], [address] Phone: [phone]   Fax:  [phone]  Physical Therapy Treatment  Patient Details  Name: Tammy Brady MRN: [id number] Date of Birth: 03-17-1951 Referring Provider (PT): Reita May Date: 06/09/2021   PT End of Session - 06/09/21 1050     Visit Number 13    Number of Visits 21    Date for PT Re-Evaluation 07/07/21    PT Start Time 1003    PT Stop Time 1048    PT Time Calculation (min) 45 min    Activity Tolerance Patient tolerated treatment well    Behavior During Therapy [facility] for tasks assessed/performed             Past Medical History:  Diagnosis Date   Aortic atherosclerosis ([place])    CAD (coronary artery disease)    Mild non-obstructive disease by cath January 2011. Coronary CTA showed  mild (25-49%) plaque in the RCA and minimal (<25%) coronary CTA 04/2021 with coronary Ca score 257   Carotid artery disease (HCC)    Followed by Dr Kellie Simmering   GERD (gastroesophageal reflux disease)    Hyperlipidemia    Hypertension    Hypothyroidism     Past Surgical History:  Procedure Laterality Date   APPENDECTOMY     IR IMAGING GUIDED PORT INSERTION  03/02/2021    There were no vitals filed for this visit.   Subjective Assessment - 06/09/21 1004     Subjective The neck is not bad but it is a little swollen. I talked to Vicente Males and told him we wanted to go ahead and pay for the compression vest.    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 CT neck same day revealed an enhancing soft tissue lesion along the buccal margin of the left mandible; with lucency in the adjacent bone consistent with known SCC. Also found were necrotic left level IA/B and II/IIIB lymph nodes, noted to be concerning for nodal metastasis. During physical exam performed at this visit, Dr.  Amada Jupiter visualized the exophytic mass in and around the left mandibular canine, premolar, and first molar, along with an enlarged level 1A lymph node. The patient opted to proceed with multiple oral biopsies, mandibular resectioning, and lymphadenectomies, 01/18/21 Resection performed on 01/18/21 revealed the following: 3.3cm tumor, Gr 2 Squamous cell carcinoma of alveolar ridge extending to buccal gingival junction and lingual sulcus, DOI 63mm, neg margins, 6/49 LN+ bilaterally with ECE; no PNI or LVSI; She also underwent Left fibula osteocutaneous free flap and ORIF mandible, will receive 30 fractions of radiation to her oral cavity and bilateral neck and 6 weeks of cisplatin chemotherapy. She started on 8/29 and will complete 04/19/21, PEG placed at [facility] on 01/18/21 and exchanged on 8/5 due to leakage. She uses PEG for most nutrition and medication. She is able to tolerate pureed foods but her reconstructive flap limits her swallowing, PAC placed at [facility] on 03/02/21, reports a 40 pack year smoking history, quitting off and on over the years, stopped smoking in May 2022.    Patient Stated Goals to gain info from providers    Currently in Pain? No/denies    Pain Score 0-No pain  [facility] Adult PT Treatment/Exercise - 06/09/21 0001       Manual Therapy   Manual Therapy Myofascial release    Myofascial Release gentle myofascial release to crevice just under chin to help improve lymphatic flow    Manual Lymphatic Drainage (MLD) in supine with head of bed elevated: short neck, 5 diaphragmatic breaths, bilateral axillary nodes, bilateral pectoral nodes, anterior chest, short neck, posterior, lateral and anterior neck moving fluid towards pathways, submental and submandibular areas moving fluid towards pathways then retracing all steps                          PT Long Term Goals - 06/09/21 1051       PT LONG TERM GOAL #1   Title Pt will be  independent in self MLD for long term management of lymphedema.    Baseline 05/11/21- pt has not been able to try this due to increased fatigue and recent illness; 06/09/21 - have been instructing pt and she has been trying this at home but is not independent yet    Time 6    Period Weeks    Status On-going      PT LONG TERM GOAL #2   Title Pt will obtain appropriate compression garments for long term management of lymphedema.    Time 6    Period Weeks    Status On-going      PT LONG TERM GOAL #3   Title Pt will report she is able to descend steps with a step through gait pattern and a decreased fear of falling.    Baseline currently step to; 05/30/21- pt reports she is now able to do this    Time 6    Period Weeks    Status Achieved      PT LONG TERM GOAL #4   Title Pt will be independent in a home exercise program for continued stretching and strengthening.    Time 6    Period Weeks    Status On-going      PT LONG TERM GOAL #5   Title Pt will report her shoulder ROM has improved and she is no longer having trouble getting dressed or reaching overhead.    Baseline 05/11/21- pt still has difficulty getting things overhead    Time 6    Period Weeks    Status On-going      PT LONG TERM GOAL #6   Title Pt will be able to complete 13 sit to stands in 30 seconds without use of UEs to decrease fall risk.    Baseline 7; 06/09/21- not assessed today    Time 6    Period Weeks    Status On-going                   Plan - 06/09/21 1054     Clinical Impression Statement Pt is making progress in therapy. She reports she is able to do stairs with a step through gait pattern now. She has been working on her ankle exercises at home and reports feeling improvements with this. She is purchasing the FlexiTouch compression pump and is awaiting it's arrival for long term management of edema. Spent todays session focusing on decreasing anterior neck and chin lymphedema to improve pt's  comfort. Pt would benefit from continued skilled PT services to continue to decrease neck swelling and continue to instruct pt in strengthening exercises and ROM for shoulders.    PT Frequency  2x / week    PT Duration 6 weeks    PT Treatment/Interventions ADLs/Self Care Home Management;Therapeutic exercise;Balance training;Neuromuscular re-education;Stair training;Gait training;Patient/family education;Orthotic Fit/Training;Manual techniques;Manual lymph drainage;Compression bandaging;Scar mobilization;Passive range of motion;Taping;Vasopneumatic Device;Joint Manipulations    PT Next Visit Plan cont MLD,  PROM to neck, once lymphedema is managed with garments and pump begin bilateral shoulder ROM and LE strengthening and balance    PT Home Exercise Plan head and neck ROM exercises,Access Code [email or web address]    Consulted and Agree with Plan of Care Patient             Patient will benefit from skilled therapeutic intervention in order to improve the following deficits and impairments:  Postural dysfunction, Impaired UE functional use, Impaired flexibility, Increased fascial restricitons, Decreased strength, Decreased knowledge of use of DME, Decreased knowledge of precautions, Decreased range of motion, Decreased scar mobility, Increased edema, Difficulty walking, Decreased balance  Visit Diagnosis: Lymphedema, not elsewhere classified  Disorder of the skin and subcutaneous tissue related to radiation, unspecified  Aftercare following surgery for neoplasm  Muscle weakness (generalized)  Difficulty in walking, not elsewhere classified  Stiffness of left shoulder, not elsewhere classified  Stiffness of right shoulder, not elsewhere classified  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HCC)     Problem List Patient Active Problem List   Diagnosis Date Noted   Encounter for general adult medical examination with abnormal findings 06/06/2021   Encounter for osteoporosis screening in  asymptomatic postmenopausal patient 06/06/2021   Visit for screening mammogram 06/06/2021   Need for immunization against influenza 06/06/2021   Symptomatic anemia 05/18/2021   CAP (community acquired pneumonia) 05/17/2021   Normocytic anemia 05/17/2021   Moderate protein-calorie malnutrition ([place]) 05/17/2021   Hyponatremia 05/17/2021   G tube feedings ([place]) 04/29/2021   Aortic atherosclerosis ([place]) 04/25/2021   Chest pain of uncertain etiology 50/03/3817   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 chemotherapy ([place]) 04/07/2021   Mucositis due to antineoplastic therapy 03/31/2021   Drug induced constipation 03/31/2021   Chemotherapy-induced nausea 03/31/2021   Port-A-Cath in place 03/09/2021   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   Carcinoma of lower gum ([place]) 02/20/2021   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[place]) 12/08/2020   Gastritis 12/08/2020   Anxiety 12/08/2020   Need for prophylactic vaccination and inoculation against influenza 07/30/2020   Depression 11/26/2015   GERD (gastroesophageal reflux disease) 11/26/2015   Migraine headache 11/26/2015   Screening for diabetes mellitus 09/03/2015   Hypothyroidism 07/22/2009   Mixed hyperlipidemia 07/22/2009   Hypertension, essential 07/22/2009   CAD, NATIVE VESSEL 07/22/2009   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   Coronary artery spasm ([place]) 07/22/2009    Allyson Sabal Hough, PT 06/09/2021, 10:58 AM  [facility] @ [place], [place], [address] Phone: [phone]   Fax:  [phone]  Name: LOVELYN SHEERAN MRN: [id number] Date of Birth: 1950/09/04   Manus Gunning, PT 06/09/21 10:58 AM

## 2021-06-10 ENCOUNTER — Telehealth: Payer: Self-pay | Admitting: Physician Assistant

## 2021-06-10 NOTE — Progress Notes (Signed)
  Chronic Care Management   Note  06/10/2021 Name: Tammy Brady MRN: [id number] DOB: [birth year]  [name] is a 70 y.o. year old female who is a primary care patient of Marge Duncans, Hershal Coria. I reached out to [employer] by phone today in response to a referral sent by [name]'s PCP, Marge Duncans, PA-C.   [name] was given information about Chronic Care Management services today including:  CCM service includes personalized support from designated clinical staff supervised by her physician, including individualized plan of care and coordination with other care providers 24/7 contact phone numbers for assistance for urgent and routine care needs. Service will only be billed when office clinical staff spend 20 minutes or more in a month to coordinate care. Only one practitioner may furnish and bill the service in a calendar month. The patient may stop CCM services at any time (effective at the end of the month) by phone call to the office staff.   Patient agreed to services and verbal consent obtained.   Follow up plan:   Tatjana Secretary/administrator

## 2021-06-10 NOTE — Chronic Care Management (AMB) (Signed)
  Chronic Care Management   Outreach Note  06/10/2021 Name: Tammy Brady MRN: [id number] DOB: 11/23/1950  Referred by: Marge Duncans, PA-C Reason for referral : No chief complaint on file.   An unsuccessful telephone outreach was attempted today. The patient was referred to the pharmacist for assistance with care management and care coordination.   Follow Up Plan:   Tatjana Dellinger Upstream Scheduler

## 2021-06-11 ENCOUNTER — Ambulatory Visit: Payer: Medicare Other | Attending: Radiation Oncology

## 2021-06-11 ENCOUNTER — Other Ambulatory Visit: Payer: Self-pay

## 2021-06-11 DIAGNOSIS — R471 Dysarthria and anarthria: Secondary | ICD-10-CM | POA: Diagnosis not present

## 2021-06-11 DIAGNOSIS — R1311 Dysphagia, oral phase: Secondary | ICD-10-CM | POA: Insufficient documentation

## 2021-06-11 NOTE — Therapy (Signed)
Tammy Brady [address] [place], [place], [address] Phone: [phone]   Fax:  [phone]  Speech Language Pathology Treatment/Renewal summary  Patient Details  Name: Tammy Brady MRN: [id number] Date of Birth: 1951-06-05 Referring Provider (SLP): Eppie Gibson, MD   Encounter Date: 06/11/2021   End of Session - 06/11/21 1639     Visit Number 3    Number of Visits 5    Date for SLP Re-Evaluation 09/09/21    SLP Start Time 31    SLP Stop Time  1400    SLP Time Calculation (min) 41 min    Activity Tolerance Patient tolerated treatment well;Patient limited by pain             Past Medical History:  Diagnosis Date   Aortic atherosclerosis ([place])    CAD (coronary artery disease)    Mild non-obstructive disease by cath January 2011. Coronary CTA showed  mild (25-49%) plaque in the RCA and minimal (<25%) coronary CTA 04/2021 with coronary Ca score 257   Carotid artery disease (HCC)    Followed by Dr Kellie Simmering   GERD (gastroesophageal reflux disease)    Hyperlipidemia    Hypertension    Hypothyroidism     Past Surgical History:  Procedure Laterality Date   APPENDECTOMY     IR IMAGING GUIDED PORT INSERTION  03/02/2021    There were no vitals filed for this visit.          ADULT SLP TREATMENT - 06/11/21 1335       Treatment Provided   Treatment provided Dysphagia      Dysphagia Treatment   Temperature Spikes Noted No    Respiratory Status Room air    Oral Cavity - Dentition Missing dentition    Treatment Methods Skilled observation;Therapeutic exercise;Compensation strategy training;Patient/caregiver education    Patient observed directly with PO's Yes    Type of PO's observed Dysphagia 1 (puree);Thin liquids    Oral Phase Signs & Symptoms Prolonged bolus formation   mid-posterior placement of applesauce; single and multiple (3) sips both are safe today, anterior labial leakage from lt 2/7 sips    Pharyngeal Phase Signs & Symptoms Other (comment)   none noted today   Other treatment/comments "I think the last two and a half weeks has been the most stable I've been since radiation has ended." Pt eating pudding, yogurt, jello, sprite. February 17th consult with surgeon for revision to debulk the flap in anterior lt oral cavity. "I am not going to eat blended meat (if pt has to remain on mofidied diet)." Pt endorses doing some of the HEP with half # of recommended reps once a week. SLP explained rationale for HEP and pt stated she is aware she has to start completing HEP.      Assessment / Recommendations / Plan   Plan Continue with current plan of care      Progression Toward Goals   Progression toward goals Not progressing toward goals (comment)   lack of HEP completion             SLP Education - 06/11/21 1639     Education Details rationale for HEP    Person(s) Educated Patient;Spouse    Methods Explanation    Comprehension Verbalized understanding              SLP Short Term Goals - 04/23/21 1718       SLP SHORT TERM GOAL #1   Title pt will complete  HEP with rare min A    Time 1    Period --   visit, for all STGs   Status Not Met   andcontinued     SLP SHORT TERM GOAL #2   Title pt will tell SLP why pt is completing HEP with modified independence    Time 1    Status Partially Met      SLP SHORT TERM GOAL #3   Title pt will describe 3 overt s/s aspiration PNA with modified independence    Time 1    Status On-going      SLP SHORT TERM GOAL #4   Title pt will tell SLP how a food journal could hasten return to a more normalized diet    Time 2    Status On-going              SLP Long Term Goals - 06/11/21 1641       SLP LONG TERM GOAL #1   Title pt will complete HEP with modified independence over two visits    Time 3    Period --   visits, for all LTGs   Status On-going   renewed 06-11-21     SLP LONG TERM GOAL #2   Title pt will describe how to  modify HEP over time, and the timeline associated with reduction in HEP frequency with modified independence over two sessions    Time 4    Status On-going   renewed 06-11-21             Plan - 06/11/21 1640     Clinical Impression Statement At this time pt exhibits at least cont'd oral stage dysphagia due mainly to surgical changes with mandible. [name]'s swallowing is deemed WNL/WFL with water and puree, although she cont PEG dependent. SLP encouraged pt to have more solids during the day. SLP reviewed pt's individualized HEP for dysphagia.  Data indicate that pt's swallow ability will likely decrease over the course of radiation therapy and could very well decline over time following conclusion of their radiation therapy due to muscle disuse atrophy and/or muscle fibrosis. Pt will cont to need to be seen by SLP in order to assess safety of PO intake, assess the need for recommending any objective swallow assessment, and ensuring pt correctly completes the individualized HEP.    Speech Therapy Frequency --   once approx every 4 weeks   Duration --   90 days   Treatment/Interventions Aspiration precaution training;Pharyngeal strengthening exercises;Diet toleration management by SLP;Trials of upgraded texture/liquids;Internal/external aids;Patient/family education;Compensatory strategies;SLP instruction and feedback    Potential to Achieve Goals Good    SLP Home Exercise Plan provided    Consulted and Agree with Plan of Care Patient             Patient will benefit from skilled therapeutic intervention in order to improve the following deficits and impairments:   Dysphagia, oral phase  Dysarthria and anarthria    Problem List Patient Active Problem List   Diagnosis Date Noted   Encounter for general adult medical examination with abnormal findings 06/06/2021   Encounter for osteoporosis screening in asymptomatic postmenopausal patient 06/06/2021   Visit for screening mammogram  06/06/2021   Need for immunization against influenza 06/06/2021   Symptomatic anemia 05/18/2021   CAP (community acquired pneumonia) 05/17/2021   Normocytic anemia 05/17/2021   Moderate protein-calorie malnutrition ([place]) 05/17/2021   Hyponatremia 05/17/2021   G tube feedings ([place]) 04/29/2021   Aortic atherosclerosis ([place])  04/25/2021   Chest pain of uncertain etiology 83/67/2550   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 chemotherapy ([place]) 04/07/2021   Mucositis due to antineoplastic therapy 03/31/2021   Drug induced constipation 03/31/2021   Chemotherapy-induced nausea 03/31/2021   Port-A-Cath in place 03/09/2021   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   Carcinoma of lower gum ([place]) 02/20/2021   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 [place], [place], [address] Phone: [phone]   Fax:  [phone]   Name: Tammy Brady MRN: [id number] Date of Birth: [birth year]

## 2021-06-14 ENCOUNTER — Encounter: Payer: Self-pay | Admitting: Hematology and Oncology

## 2021-06-14 NOTE — Progress Notes (Signed)
                                                                                                                                                             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   Plan: The patient will follow-up with radiation oncology in 2-3 wks.  -----------------------------------  Eppie Gibson, MD

## 2021-06-17 ENCOUNTER — Telehealth: Payer: Self-pay

## 2021-06-17 NOTE — Chronic Care Management (AMB) (Signed)
Chronic Care Management Pharmacy Assistant   Name: Tammy Brady  MRN: [id number] DOB: 09/08/1950  Reason for Encounter: Chart review for CPP visit on 06-22-2021   Conditions to be addressed/monitored: HTN, HLD, and Depression   Recent office visits:  05-25-2021 [name], MD. DG bone density and MM digital screening bilateral completed.  04-20-2021 Marge Duncans, PA-C.  WBC= 3.2, RBC= 3.56, Hemo= 10.0, Hema= 30.9, RDW= 16.0, Lymphocytes= 0.2, NRBC= 1. BUN/Creatinine= 31, Chloride= 93, CO2= 18, Calcium= 8.6, Albumin= 3.4, Albumin/globulin= 1.1, AST= 47. Follow up in 3 months.  04-19-2021 Eppie Gibson, MD (Oncology). Radiation therapy. Follow up 2-3 weeks.  01-14-2021 Marge Duncans, PA-C. STOP peridex solution.  12-29-2020 Donato Heinz, MD (Cardiology). Unable to view encounter.  12-25-2020 Gordy Councilman, MD (Radiology). CTA Lower Extremity W Wo Contrast completed.  12-22-2020 Sueanne Margarita, MD (Cardiology). Orders placed for EKG, exercise tolerance test.  Recent consult visits:  06-11-2021 Sharen Counter, CCC-SLP (Speech pathology). Rehab visit for dysphagia.  06-09-2021 [name], [name], PT (physical therapy). Therapy for walking.  06-07-2021 [name], [name], PT (physical therapy). Therapy for walking.  06-02-2021 Claris Pong, PT (physical therapy). Therapy for walking.  05-31-2021 [name], [name], PT (physical therapy). Therapy for walking.  05-31-2021 [name], NP (Hematology/oncology). DG chest view ordered." Her hemoglobin has improved to 10.5.  She is not as fatigued.  She does have an occasional shortness of breath with activity however that is related to deconditioning.  Her lung mammogram normal today.  And her weight is up from 1 62-1 67. Tammy Brady will return in about 6 to 7 weeks for labs port flush and follow-up with Dr. Recoup.  She will undergo repeat imaging at [facility] with her ear nose and throat  doctor on July 14, 2021. She will repeat her chest x-ray on the week of December 5. Encouraged her to continue with her activity level, walking regularly and slowly increasing her walking time by 1 minute/week.  She will continue to work with physical therapy for her lymphedema".  05-28-2021 Lennox Grumbles, MD (Oncology). CT Soft Tissue Neck with contrast completed. "Continues to heal well. The patients oral flap continues to have significant volume, although decreasing. We will wait until the patient is fully healed prior to considering a revision flap surgery.  - Follow up with me in 3 months for repeat evaluation of the flap".  05-25-2021 [facility]. Flu and covid booster vaccine administered.  05-24-2021 [name], [name], PT (physical therapy). Therapy for walking.  05-19-2021 [name], NP (Hematology/oncology). "Given positive margins and presence of extracapsular extension, we agreed with adjuvant chemoradiation. She completed 6 weekly cycles of cisplatin concurrent with radiation. Tammy Brady is here for follow-up of her continued symptoms after receiving chemotherapy and radiation.  She continues to struggle.  Her hemoglobin has improved to 9.4 since she received a transfusion yesterday, she is tolerating her via tube antibiotics without difficulty.  She will continue those antibiotics.  Instead of doing a chest x-ray next week, we will do 1 in 4 weeks.I counseled her on her fatigue. I recommended small amounts of exercise such as 5 minutes of walk once a day.  I also recommended that she do some chair exercises to help with her strength level. We discussed her new headaches.  I recommended imaging which she and her husband agreed to, but would like to discuss with her surgeon at [facility] since she is seeing them on November  18.  I will see her on November 22 for labs and follow-up and we will discuss further during that time".  05-13-2021 [name], PT(Rehab).  Therapy for walking.  05-11-2021 [name], [name], PT (physical therapy). Therapy for walking.  05-07-2021 [name], PA-C (Hematology/oncology). "patient has been trying to increase fluid intake at home however continues to feel dehydrated.  Labs today show no leukocytosis, hemoglobin 8.3, only slightly dropped from 4 days prior when it was 8.5.  No significant electrolyte derangement, no renal insufficiency, no transaminitis.  Patient given liter of IV fluids and is feeling improved.  Encouraged her to continue to increase fluid intake at home via p.o. and her G-tube as dehydration could be causing her headaches.  Patient knows to continue Tylenol for chills and pain at home as well.  She has 3 days of Augmentin remaining and plans to finish antibiotic course. Patient is scheduled for repeat chest x-ray in 3 weeks and will follow-up with medical oncology.  Discussed strict return precautions".   05-05-2021 Malmfelt, Stephani Police, RN (Oncology). "I met with Tammy Brady and her husband while they were here to see Dr. Isidore Moos for follow up today. She is feeling better than a few days ago but will come for IVF on 10/28 in symptom management. She is drinking water and using her feeding tube for all other nutrition. She is seeing her surgeon at [facility] on November 18th for follow up after surgery and treatment. She will see Dr. Isidore Moos the 2nd week of January 2023 for CT scan results. They know to call me if she has any concerns or questions".  05-05-2021 Morrell Riddle, RD (Dietician). "Continue 6 cartons Osmolite 1.5 via tube split over four feedings/day Encouraged taking small sips of thin liquids through out the day, diet advancement per SLP .Suggested pt try Premier Protein clear once daily by mouth for added protein (90 kcal, 20 grams protein). Patient provided sample of Ensure Clear to try Encouraged patient to complete daily HEP per SLP. Continue baking soda, salt water rinses several  times/dayProvided support and encouragement. Patient has contact information".  05-03-2021 [name], PA-C (Hematology/oncology). START Augmentin 250-125 mg 3 times daily for 7 days, Nystatin 500,000 units oral 4 times daily.  04-29-2021 Estella Husk, LPN (Oncology). Implanted port insertion after care.  04-23-2021 Sharen Counter, CCC-SLP (Speech pathology). "Do 5-7 swallows of 1/3 teaspoon water with an additional dry swallow, 10-15 times a day; you can do more times a day if you desire. If you start to cough, stop and try again 10-15 minutes later CYCLE THROUGH the exercises until you can't do any more".  04-21-2021 Morrell Riddle, RD (Dietician). "Continue 6 cartons Osmolite 1.5 via tube split over 4 feedings/day. Flush with 90 ml water before and after each feeding. Drink by mouth or give via tube additional 2 cups fluids daily.Encouraged oral intake as tolerated. Continue baking soda, salt water rinses several times daily. Continue lidocaine mouth rinses. Patient has contact information".  04-21-2021 Benay Pike, MD (Hematology/oncology). "Given positive margins and presence of extracapsular extension, we agreed with adjuvant chemoradiation. She completed 5 weekly cycles of cisplatin concurrent with radiation. She is here for follow-up.  Review of systems pertinent for ongoing fatigue, mucositis Physical examination, she appears better, erythema of neck with crusting, drooling saliva, cannot open her mouth quite wide.  Have reviewed the labs today, neutropenia improved, anemia worsened but no indication for transfusion, no thrombocytopenia. She is willing to completed  planned C6 today. If labs are satisfactory, ok to proceed with chemo today".  04-14-2021 Morrell Riddle, RD (Dietician). "Continue 6 cartons Osmolite 1.5 via tube split over 4 feedings/day. Flush with 90 ml water before and after each feeding. Drink by mouth or give via tube additional 2 cups fluids  daily. This provides 2130 kcal, 89 grams protein, 2286 ml total water. Contacted [employer] for future formula and tube feeding supply needs. Bag with split guaze and tape left at registration desk for pick up on 10/6 - husband aware and appreciative. Continue lidocaine mouth rinses. Continue Fairlife Shake for added calories and protein as tolerated  Provided support and encouragement Patient has contact information".  04-14-2021 Benay Pike, MD (Hematology/oncology). "Physical examination, she appears very weak today, severe erythema on her neck, drooling saliva, cannot open her mouth quite wide.  Have reviewed the labs today, severe neutropenia, will defer cycle 6 of planned chemotherapy.  She will return to clinic in 1 week for anticipated cycle 7".  04-13-2021 [name], [name], PT (Physical therapy). Therapy for walking.  04-08-2021 Baird Lyons, PT (Physical therapy). Therapy for walking.  04-07-2021 Morrell Riddle, RD (Dietician). "Continue 6 cartons Osmolite 1.5 via tube split over 4 feedings daily with 90 ml water flush before and after feedings Patient will give additional 2 cups water flushes in between feeds to aid with abdominal fullness Continue Fairlife Shakes as tolerated  Continue baking soda, salt water rinses several times daily Continue Lidocaine mouth rinse Discussed strategies for dry mouth  Continue daily stool softener for constipation Continue nausea medication as needed Provided 3 packs of split guaze to patient as she forgot to bring these with her today Provided pt pair of mesh underwear to aid with holding tube in place without use of tape Patient has contact information".   04-07-2021 Benay Pike, MD (Hematology/oncology). Infusion given. Follow up.   04-05-2021 [name], Melodie Bouillon, PT (Physical therapy). Therapy for walking.  04-01-2021 [name], PT(Rehab). Therapy for walking.  03-31-2021 Morrell Riddle, RD  (Dietician). "Patient will increase tube feedings to 6 cartons Osmolite 1.5 split over 4 feedings/day. Flush with 90 ml water before and after each feeding.Drink by mouth or give via tube additional 2 cups fluids daily. This will provide 2130 kcal, 89 grams protein, 2286 ml total water Continue drinking Fairlife Shakes as tolerated for added calories and protein (150 kcal, 30 grams protein each) Discussed strategies for nausea, continue taking Zofran as needed Continue baking soda, salt water rinses several times daily Continue Lidocaine rinses Continue stool softener Provided support and encouragement Patient has contact information".  03-31-2021 Benay Pike, MD. (Hematology/oncology). Infusion given. Follow up.  03-30-2021 [name], Melodie Bouillon, PT (Physical therapy). Therapy for walking.  03-24-2021 Morrell Riddle, RD (Dietician). "Encouraged consuming soft moist high protein foods as tolerated - handout with recipes provided Increase tube feeding to 5 cartons Osmolite 1.5 split over 4 feedings/day Continue drinking Fairlife Shake daily (150 kcal, 30 grams protein) Encourage baking soda, salt water rinses several times daily - handout with recipes for sore mouth provided Continue Lidocaine mouth rinse  Continue stool softener".   03-24-2021 Benay Pike, MD (Hematology/Oncology). STOP augmentin, periogard, oxycodone.  03-17-2021  Benay Pike, MD (Hematology/Oncology). Follow up.  03-17-2021 Morrell Riddle, RD (Dietician). "Encouraged drinking oral nutrition supplement for added calories and protein - pt tried [employer] and did not like it, she has not tried the Ensure Complete.Continue 1 carton Osmolite 1.5  QID with 90 ml water flush before and after each feeding. Patient understands that she will increase tube feedings if weight decreases and unable to tolerate oral nutrition. Patient has contact information".  03-11-2021 Sharen Counter, CCC-SLP (Speech  pathology). Therapy for swallowing.  03-11-2021 [name], [name], PT (physical therapy). Therapy for walking  03-09-2021 Benay Pike, MD (Hematology/oncology). Follow up.  03-05-2021 Marlene Bast, FNP (Surgery oncology). 6-week status post laparoscopic gastrostomy tube placement. 18 French G-tube exchanged at bedside for malfunctioning medicine port, likely over stretched by syringe tip. Follow-up with surgical oncology on as-needed basis. Call clinic if there is any continued difficulty with leaking ports or if patient no longer requires enteral access, and providers would like G-tube removed.  02-25-2021 Beverely Pace, LCSW (Social worker). "Discussed common feeling and emotions when being diagnosed with cancer, and the importance of support during treatment. Informed patient of the support team roles and support services at [facility]. Provided CSW contact information and encouraged patient to call with any questions or concerns. Referred to organization Support for People with Oral and Head and Neck Cancer   02-23-2021 Sandi Mariscal B, DMD (Dentistry). Oral treatment and plan.  02-23-2021 Benay Pike, MD (Hematology/oncology). Orders placed for IR imaging guided port insertion.  02-05-2021 Lennox Grumbles, MD (Oncology). "Discussed final pathology with the patient which returned positive for pT3N3b p16- SCC with ENE present. The patient will proceed with adjuvant CCRT locally [facility]) as scheduled.  - Wound care instructions were discussed with the patient in detail. Recommended she apply Vaseline or Aquaphor to the left free fibula donor site twice per day for the first 6 weeks. Further advised her to keep her left leg elevated at rest. She will need to wear the boot for a total of 6 weeks from surgery. - Follow up with me in 3 weeks for wound assessment".   02-05-2021 Gordy Councilman, MD (Oncology). Post op check.  01-18-2021-01-21-2021  Josie Dixon. Laparoscopy and tracheostomy procedure.    Hospital visits:  Medication Reconciliation was completed by comparing discharge summary, patient's EMR and Pharmacy list, and upon discussion with patient.  Admitted to the hospital on 05-17-2021 due to community acquired pneumonia. Discharge date was 05-18-2021. Discharged from [place]?Medications Started at [facility] Discharge:?? Amoxicillin 875 mg per tube twice daily Azithromycin 250 mg per tube daily  Medication Changes at Hospital Discharge: None  Medications Discontinued at Hospital Discharge: Dexamethasone Omeprazole Ondansetron prochlorperazine  Medications that remain the same after Hospital Discharge:??  -All other medications will remain the same.    Medications: Outpatient Encounter Medications as of 06/17/2021  Medication Sig   acetaminophen (TYLENOL) 500 MG tablet Take 500 mg by mouth every 6 (six) hours as needed for mild pain.   aspirin EC 81 MG tablet Take 1 tablet (81 mg total) by mouth daily. Swallow whole.   calcium carbonate (OS-CAL) 600 MG TABS Take 600 mg by mouth daily.   diltiazem (CARDIZEM LA) 360 MG 24 hr tablet Take 1 tablet (360 mg total) by mouth daily.   gabapentin (NEURONTIN) 300 MG capsule Take 300 mg by mouth 3 (three) times daily.   levothyroxine (SYNTHROID) 112 MCG tablet TAKE 1 TABLET BY MOUTH  DAILY (Patient taking differently: Take 112 mcg by mouth daily.)   metoprolol tartrate (LOPRESSOR) 50 MG tablet TAKE 1 TABLET BY MOUTH ONE FOR 1 DOSE - TAKE 1 TABLET TWO HOURS PRIOR TO CT SCAN   Morphine Sulfate (MORPHINE CONCENTRATE)  10 mg / 0.5 ml concentrated solution Take 0.25-0.5 mLs (5-10 mg total) by mouth every 6 (six) hours as needed for severe pain.   nitroGLYCERIN (NITROSTAT) 0.4 MG SL tablet Place 1 tablet (0.4 mg total) under the tongue every 5 (five) minutes as needed. (Patient taking differently: Place 0.4 mg under the tongue every 5 (five) minutes as  needed for chest pain.)   Nutritional Supplements (FEEDING SUPPLEMENT, OSMOLITE 1.5 CAL,) LIQD Place 360 mLs into feeding tube in the morning, at noon, in the evening, and at bedtime.   Polyethylene Glycol 3350 (MIRALAX PO) Take 17 g by mouth daily as needed (constipation).   simvastatin (ZOCOR) 40 MG tablet TAKE 1 TABLET BY MOUTH AT  BEDTIME (Patient taking differently: Take 40 mg by mouth at bedtime.)   traZODone (DESYREL) 50 MG tablet Take 1 tablet (50 mg total) by mouth at bedtime.   No facility-administered encounter medications on file as of 06/17/2021.  Have you seen any other providers since your last visit? Patient stated no  Any changes in your medications or health? Patient stated no.  Any side effects from any medications? Patient states she seems to get headaches after taking OxyContin.   Do you have an symptoms or problems not managed by your medications? Patient stated no.  Any concerns about your health right now? Patient stated just recovering from cancer treatment.  Has your provider asked that you check blood pressure, blood sugar, or follow special diet at home? Patient states she has a feeding tube.  Do you get any type of exercise on a regular basis? Patient states she walks around the house.  Can you think of a goal you would like to reach for your health? Patient prefers not to answer question.   Do you have any problems getting your medications? Patient stated no  Is there anything that you would like to discuss during the appointment? Patient stated nothing at the moment.  Please bring medications and supplements to appointment   Care Gaps: None  Star Rating Drugs: Simvastatin 40 mg- Last filled [facility] Pharmacist Assistant [phone]

## 2021-06-21 ENCOUNTER — Telehealth: Payer: Self-pay

## 2021-06-21 ENCOUNTER — Ambulatory Visit: Payer: Medicare Other | Attending: Radiation Oncology | Admitting: Physical Therapy

## 2021-06-21 ENCOUNTER — Encounter: Payer: Self-pay | Admitting: Physical Therapy

## 2021-06-21 ENCOUNTER — Other Ambulatory Visit: Payer: Self-pay

## 2021-06-21 DIAGNOSIS — M25611 Stiffness of right shoulder, not elsewhere classified: Secondary | ICD-10-CM | POA: Diagnosis not present

## 2021-06-21 DIAGNOSIS — L599 Disorder of the skin and subcutaneous tissue related to radiation, unspecified: Secondary | ICD-10-CM | POA: Insufficient documentation

## 2021-06-21 DIAGNOSIS — C060 Malignant neoplasm of cheek mucosa: Secondary | ICD-10-CM | POA: Insufficient documentation

## 2021-06-21 DIAGNOSIS — R262 Difficulty in walking, not elsewhere classified: Secondary | ICD-10-CM | POA: Diagnosis not present

## 2021-06-21 DIAGNOSIS — M25612 Stiffness of left shoulder, not elsewhere classified: Secondary | ICD-10-CM | POA: Insufficient documentation

## 2021-06-21 DIAGNOSIS — M6281 Muscle weakness (generalized): Secondary | ICD-10-CM | POA: Diagnosis not present

## 2021-06-21 DIAGNOSIS — Z483 Aftercare following surgery for neoplasm: Secondary | ICD-10-CM | POA: Insufficient documentation

## 2021-06-21 DIAGNOSIS — I890 Lymphedema, not elsewhere classified: Secondary | ICD-10-CM | POA: Insufficient documentation

## 2021-06-21 NOTE — Therapy (Signed)
[facility] @ [address] [place], [place], [address] Phone: [phone]   Fax:  [phone]  Physical Therapy Treatment  Patient Details  Name: Tammy Brady MRN: [id number] Date of Birth: March 28, 1951 Referring Provider (PT): Isidore Moos   Encounter Date: 06/21/2021   PT End of Session - 06/21/21 1052     Visit Number 14    Number of Visits 21    Date for PT Re-Evaluation 07/07/21    PT Start Time 1005    PT Stop Time 1054    PT Time Calculation (min) 49 min    Activity Tolerance Patient tolerated treatment well    Behavior During Therapy [facility] for tasks assessed/performed             Past Medical History:  Diagnosis Date   Aortic atherosclerosis ([place])    CAD (coronary artery disease)    Mild non-obstructive disease by cath January 2011. Coronary CTA showed  mild (25-49%) plaque in the RCA and minimal (<25%) coronary CTA 04/2021 with coronary Ca score 257   Carotid artery disease (HCC)    Followed by Dr Kellie Simmering   GERD (gastroesophageal reflux disease)    Hyperlipidemia    Hypertension    Hypothyroidism     Past Surgical History:  Procedure Laterality Date   APPENDECTOMY     IR IMAGING GUIDED PORT INSERTION  03/02/2021    There were no vitals filed for this visit.   Subjective Assessment - 06/21/21 1006     Subjective Some days are better than others as far as my swelling goes. I notice in the morning that the right side of my face feels puffy. My compression pump came a week ago Sunday. The lady is coming to do my training this afternoon.    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 CT neck same day revealed an enhancing soft tissue lesion along the buccal margin of the left mandible; with lucency in the adjacent bone consistent with known SCC. Also found were necrotic left level IA/B and II/IIIB lymph nodes, noted  to be concerning for nodal metastasis. During physical exam performed at this visit, Dr. Amada Jupiter visualized the exophytic mass in and around the left mandibular canine, premolar, and first molar, along with an enlarged level 1A lymph node. The patient opted to proceed with multiple oral biopsies, mandibular resectioning, and lymphadenectomies, 01/18/21 Resection performed on 01/18/21 revealed the following: 3.3cm tumor, Gr 2 Squamous cell carcinoma of alveolar ridge extending to buccal gingival junction and lingual sulcus, DOI 37mm, neg margins, 6/49 LN+ bilaterally with ECE; no PNI or LVSI; She also underwent Left fibula osteocutaneous free flap and ORIF mandible, will receive 30 fractions of radiation to her oral cavity and bilateral neck and 6 weeks of cisplatin chemotherapy. She started on 8/29 and will complete 04/19/21, PEG [facility] at [facility] on 01/18/21 and exchanged on 8/5 due to leakage. She uses PEG for most nutrition and medication. She is able to tolerate pureed foods but her reconstructive flap limits her swallowing, PAC placed at [facility] on 03/02/21, reports a 40 pack year smoking history, quitting off and on over the years, stopped smoking in May 2022.    Patient Stated Goals to gain info from providers    Currently in Pain? No/denies    Pain Score 0-No pain  [facility] Adult PT Treatment/Exercise - 06/21/21 0001       Manual Therapy   Myofascial Release gentle myofascial release to crevice just under chin to help improve lymphatic flow    Manual Lymphatic Drainage (MLD) in supine with head of bed elevated: short neck, 5 diaphragmatic breaths, bilateral axillary nodes, bilateral pectoral nodes, anterior chest, short neck, posterior, lateral and anterior neck moving fluid towards pathways, submental and submandibular areas moving fluid towards pathways then retracing all steps                          PT Long Term Goals - 06/09/21 1051        PT LONG TERM GOAL #1   Title Pt will be independent in self MLD for long term management of lymphedema.    Baseline 05/11/21- pt has not been able to try this due to increased fatigue and recent illness; 06/09/21 - have been instructing pt and she has been trying this at home but is not independent yet    Time 6    Period Weeks    Status On-going      PT LONG TERM GOAL #2   Title Pt will obtain appropriate compression garments for long term management of lymphedema.    Time 6    Period Weeks    Status On-going      PT LONG TERM GOAL #3   Title Pt will report she is able to descend steps with a step through gait pattern and a decreased fear of falling.    Baseline currently step to; 05/30/21- pt reports she is now able to do this    Time 6    Period Weeks    Status Achieved      PT LONG TERM GOAL #4   Title Pt will be independent in a home exercise program for continued stretching and strengthening.    Time 6    Period Weeks    Status On-going      PT LONG TERM GOAL #5   Title Pt will report her shoulder ROM has improved and she is no longer having trouble getting dressed or reaching overhead.    Baseline 05/11/21- pt still has difficulty getting things overhead    Time 6    Period Weeks    Status On-going      PT LONG TERM GOAL #6   Title Pt will be able to complete 13 sit to stands in 30 seconds without use of UEs to decrease fall risk.    Baseline 7; 06/09/21- not assessed today    Time 6    Period Weeks    Status On-going                   Plan - 06/21/21 1055     Clinical Impression Statement Pt has received her flexitouch compression pump and will be trained on its use this afternoon. She reports her swelling is worse at times. Recently she has noticed more swelling in her face. Spent extra time today on her cheeks and chin to help reduce fluid in these areas. By end of session pt could tell improvement in her edema.    PT Frequency 2x / week    PT  Duration 6 weeks    PT Treatment/Interventions ADLs/Self Care Home Management;Therapeutic exercise;Balance training;Neuromuscular re-education;Stair training;Gait training;Patient/family education;Orthotic Fit/Training;Manual techniques;Manual lymph drainage;Compression bandaging;Scar mobilization;Passive range of motion;Taping;Vasopneumatic Device;Joint Manipulations    PT Next Visit Plan  cont MLD,  PROM to neck, once lymphedema is managed with garments and pump begin bilateral shoulder ROM and LE strengthening and balance    PT Home Exercise Plan head and neck ROM exercises,Access Code [email or web address]    Consulted and Agree with Plan of Care Patient             Patient will benefit from skilled therapeutic intervention in order to improve the following deficits and impairments:  Postural dysfunction, Impaired UE functional use, Impaired flexibility, Increased fascial restricitons, Decreased strength, Decreased knowledge of use of DME, Decreased knowledge of precautions, Decreased range of motion, Decreased scar mobility, Increased edema, Difficulty walking, Decreased balance  Visit Diagnosis: Lymphedema, not elsewhere classified  Aftercare following surgery for neoplasm  Muscle weakness (generalized)  Difficulty in walking, not elsewhere classified  Stiffness of left shoulder, not elsewhere classified  Disorder of the skin and subcutaneous tissue related to radiation, unspecified  Stiffness of right shoulder, not elsewhere classified  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HCC)     Problem List Patient Active Problem List   Diagnosis Date Noted   Encounter for general adult medical examination with abnormal findings 06/06/2021   Encounter for osteoporosis screening in asymptomatic postmenopausal patient 06/06/2021   Visit for screening mammogram 06/06/2021   Need for immunization against influenza 06/06/2021   Symptomatic anemia 05/18/2021   CAP (community acquired pneumonia)  05/17/2021   Normocytic anemia 05/17/2021   Moderate protein-calorie malnutrition ([place]) 05/17/2021   Hyponatremia 05/17/2021   G tube feedings ([place]) 04/29/2021   Aortic atherosclerosis ([place]) 04/25/2021   Chest pain of uncertain etiology 26/33/3545   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 chemotherapy ([place]) 04/07/2021   Mucositis due to antineoplastic therapy 03/31/2021   Drug induced constipation 03/31/2021   Chemotherapy-induced nausea 03/31/2021   Port-A-Cath in place 03/09/2021   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   Carcinoma of lower gum ([place]) 02/20/2021   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[place]) 12/08/2020   Gastritis 12/08/2020   Anxiety 12/08/2020   Need for prophylactic vaccination and inoculation against influenza 07/30/2020   Depression 11/26/2015   GERD (gastroesophageal reflux disease) 11/26/2015   Migraine headache 11/26/2015   Screening for diabetes mellitus 09/03/2015   Hypothyroidism 07/22/2009   Mixed hyperlipidemia 07/22/2009   Hypertension, essential 07/22/2009   CAD, NATIVE VESSEL 07/22/2009   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   Coronary artery spasm ([place]) 07/22/2009    Allyson Sabal [place], PT 06/21/2021, 10:57 AM  [facility] @ [address] [place], [place], [address] Phone: [phone]   Fax:  [phone]  Name: YAMELI DELAMATER MRN: [id number] Date of Birth: May 14, 1951   Manus Gunning, PT 06/21/21 10:57 AM

## 2021-06-21 NOTE — Progress Notes (Signed)
06/21/21 Appt reminder tomorrow with CPP- LVM   Danielle Gerringer, CMA Clinical Pharmacist Assistant  [phone]  

## 2021-06-22 ENCOUNTER — Ambulatory Visit (INDEPENDENT_AMBULATORY_CARE_PROVIDER_SITE_OTHER): Payer: Medicare Other

## 2021-06-22 DIAGNOSIS — E782 Mixed hyperlipidemia: Secondary | ICD-10-CM

## 2021-06-22 DIAGNOSIS — C060 Malignant neoplasm of cheek mucosa: Secondary | ICD-10-CM

## 2021-06-22 DIAGNOSIS — I10 Essential (primary) hypertension: Secondary | ICD-10-CM

## 2021-06-22 DIAGNOSIS — E039 Hypothyroidism, unspecified: Secondary | ICD-10-CM

## 2021-06-22 NOTE — Progress Notes (Signed)
Chronic Care Management Pharmacy Note  06/22/2021 Name:  Tammy Brady MRN:  [id number] DOB:  17-Oct-1950  Summary: -Pleasant 70 year old woman presents for initial CCM visit with her husband Tammy Brady. 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 In May, she was Dx with Cx, had Sx in July, and finished treatments in October. She only has 3 teeth left and can't chew, due to inflammation, it's also very hard to swallow things. She wants to be able to heal enough that she can eat Hansel Starling again.  Recommendations/Changes made from today's visit: None   Subjective: Tammy Brady is an 70 y.o. year old female who is a primary patient of Tammy Brady, [place].  The CCM team was consulted for assistance with disease management and care coordination needs.    Engaged with patient by telephone for initial visit in response to provider referral for pharmacy case management and/or care coordination services.   Consent to Services:  The patient was given the following information about Chronic Care Management services today, agreed to services, and gave verbal consent: 1. CCM service includes personalized support from designated clinical staff supervised by the primary care provider, including individualized plan of care and coordination with other care providers 2. 24/7 contact phone numbers for assistance for urgent and routine care needs. 3. Service will only be billed when office clinical staff spend 20 minutes or more in a month to coordinate care. 4. Only one practitioner may furnish and bill the service in a calendar month. 5.The patient may stop CCM services at any time (effective at the end of the month) by phone call to the office staff. 6. The patient will be responsible for cost sharing (co-pay) of up to 20% of the service fee (after annual deductible is met). Patient agreed to services and consent obtained.  Patient Care Team: Tammy Brady, Hershal Coria as PCP - General (Physician  Assistant) Malmfelt, Stephani Police, RN as Oncology Nurse Navigator Eppie Gibson, MD as Consulting Physician (Radiation Oncology) Benay Pike, MD as Consulting Physician (Hematology and Oncology) Lane Hacker, [facility] as Pharmacist (Pharmacist)  Recent office visits:  05-25-2021 [name], MD. DG bone density and MM digital screening bilateral completed.   04-20-2021 Tammy Duncans, PA-C.  WBC= 3.2, RBC= 3.56, Hemo= 10.0, Hema= 30.9, RDW= 16.0, Lymphocytes= 0.2, NRBC= 1. BUN/Creatinine= 31, Chloride= 93, CO2= 18, Calcium= 8.6, Albumin= 3.4, Albumin/globulin= 1.1, AST= 47. Follow up in 3 months.   04-19-2021 Eppie Gibson, MD (Oncology). Radiation therapy. Follow up 2-3 weeks.   01-14-2021 Tammy Duncans, PA-C. STOP peridex solution.   12-29-2020 Donato Heinz, MD (Cardiology). Unable to view encounter.   12-25-2020 Gordy Councilman, MD (Radiology). CTA Lower Extremity W Wo Contrast completed.   12-22-2020 Sueanne Margarita, MD (Cardiology). Orders placed for EKG, exercise tolerance test.   Recent consult visits:  06-11-2021 Sharen Counter, CCC-SLP (Speech pathology). Rehab visit for dysphagia.   06-09-2021 [name], [name], PT (physical therapy). Therapy for walking.   06-07-2021 [name], [name], PT (physical therapy). Therapy for walking.   06-02-2021 Claris Pong, PT (physical therapy). Therapy for walking.   05-31-2021 [name], [name], PT (physical therapy). Therapy for walking.   05-31-2021 [name], NP (Hematology/oncology). DG chest view ordered." Her hemoglobin has improved to 10.5.  She is not as fatigued.  She does have an occasional shortness of breath with activity however that is related to deconditioning.  Her lung mammogram normal today.  And her weight is up from 1 62-1 67. [name] will return in about 6 to 7 weeks for labs port flush and follow-up with Dr. Recoup.  She will undergo repeat imaging at [facility] with her  ear nose and throat doctor on July 14, 2021. She will repeat her chest x-ray on the week of December 5. Encouraged her to continue with her activity level, walking regularly and slowly increasing her walking time by 1 minute/week.  She will continue to work with physical therapy for her lymphedema".   05-28-2021 Lennox Grumbles, MD (Oncology). CT Soft Tissue Neck with contrast completed. "Continues to heal well. The patients oral flap continues to have significant volume, although decreasing. We will wait until the patient is fully healed prior to considering a revision flap surgery.  - Follow up with me in 3 months for repeat evaluation of the flap".   05-25-2021 [facility]. Flu and covid booster vaccine administered.   05-24-2021 [name], [name], PT (physical therapy). Therapy for walking.   05-19-2021 [name], NP (Hematology/oncology). "Given positive margins and presence of extracapsular extension, we agreed with adjuvant chemoradiation. She completed 6 weekly cycles of cisplatin concurrent with radiation. [name] is here for follow-up of her continued symptoms after receiving chemotherapy and radiation.  She continues to struggle.  Her hemoglobin has improved to 9.4 since she received a transfusion yesterday, she is tolerating her via tube antibiotics without difficulty.  She will continue those antibiotics.  Instead of doing a chest x-ray next week, we will do 1 in 4 weeks.I counseled her on her fatigue. I recommended small amounts of exercise such as 5 minutes of walk once a day.  I also recommended that she do some chair exercises to help with her strength level. We discussed her new headaches.  I recommended imaging which she and her husband agreed to, but would like to discuss with her surgeon at [facility] since she is seeing them on November 18.  I will see her on November 22 for labs and follow-up and we will discuss further during that time".   05-13-2021  [name], PT(Rehab). Therapy for walking.   05-11-2021 [name], [name], PT (physical therapy). Therapy for walking.   05-07-2021 [name], PA-C (Hematology/oncology). "patient has been trying to increase fluid intake at home however continues to feel dehydrated.  Labs today show no leukocytosis, hemoglobin 8.3, only slightly dropped from 4 days prior when it was 8.5.  No significant electrolyte derangement, no renal insufficiency, no transaminitis.  Patient given liter of IV fluids and is feeling improved.  Encouraged her to continue to increase fluid intake at home via p.o. and her G-tube as dehydration could be causing her headaches.  Patient knows to continue Tylenol for chills and pain at home as well.  She has 3 days of Augmentin remaining and plans to finish antibiotic course. Patient is scheduled for repeat chest x-ray in 3 weeks and will follow-up with medical oncology.  Discussed strict return precautions".    05-05-2021 Malmfelt, Stephani Police, RN (Oncology). "I met with Ms. Saari and her husband while they were here to see Dr. Isidore Moos for follow up today. She is feeling better than a few days ago but will come for IVF on 10/28 in symptom management. She is drinking water and using her feeding tube for all other nutrition. She is seeing her surgeon at [facility] on November 18th for follow up after surgery and treatment. She will see Dr.  Isidore Moos the 2nd week of January 2023 for CT scan results. They know to call me if she has any concerns or questions".   05-05-2021 Morrell Riddle, RD (Dietician). "Continue 6 cartons Osmolite 1.5 via tube split over four feedings/day Encouraged taking small sips of thin liquids through out the day, diet advancement per SLP .Suggested pt try Premier Protein clear once daily by mouth for added protein (90 kcal, 20 grams protein). Patient provided sample of Ensure Clear to try Encouraged patient to complete daily HEP per SLP. Continue baking  soda, salt water rinses several times/dayProvided support and encouragement. Patient has contact information".   05-03-2021 [name], PA-C (Hematology/oncology). START Augmentin 250-125 mg 3 times daily for 7 days, Nystatin 500,000 units oral 4 times daily.   04-29-2021 Estella Husk, LPN (Oncology). Implanted port insertion after care.   04-23-2021 Sharen Counter, CCC-SLP (Speech pathology). "Do 5-7 swallows of 1/3 teaspoon water with an additional dry swallow, 10-15 times a day; you can do more times a day if you desire. If you start to cough, stop and try again 10-15 minutes later CYCLE THROUGH the exercises until you can't do any more".   04-21-2021 Morrell Riddle, RD (Dietician). "Continue 6 cartons Osmolite 1.5 via tube split over 4 feedings/day. Flush with 90 ml water before and after each feeding. Drink by mouth or give via tube additional 2 cups fluids daily.Encouraged oral intake as tolerated. Continue baking soda, salt water rinses several times daily. Continue lidocaine mouth rinses. Patient has contact information".   04-21-2021 Benay Pike, MD (Hematology/oncology). "Given positive margins and presence of extracapsular extension, we agreed with adjuvant chemoradiation. She completed 5 weekly cycles of cisplatin concurrent with radiation. She is here for follow-up.  Review of systems pertinent for ongoing fatigue, mucositis Physical examination, she appears better, erythema of neck with crusting, drooling saliva, cannot open her mouth quite wide.  Have reviewed the labs today, neutropenia improved, anemia worsened but no indication for transfusion, no thrombocytopenia. She is willing to completed planned C6 today. If labs are satisfactory, ok to proceed with chemo today".   04-14-2021 Morrell Riddle, RD (Dietician). "Continue 6 cartons Osmolite 1.5 via tube split over 4 feedings/day. Flush with 90 ml water before and after each feeding. Drink by mouth or give  via tube additional 2 cups fluids daily. This provides 2130 kcal, 89 grams protein, 2286 ml total water. Contacted [employer] for future formula and tube feeding supply needs. Bag with split guaze and tape left at registration desk for pick up on 10/6 - husband aware and appreciative. Continue lidocaine mouth rinses. Continue Fairlife Shake for added calories and protein as tolerated  Provided support and encouragement Patient has contact information".   04-14-2021 Benay Pike, MD (Hematology/oncology). "Physical examination, she appears very weak today, severe erythema on her neck, drooling saliva, cannot open her mouth quite wide.  Have reviewed the labs today, severe neutropenia, will defer cycle 6 of planned chemotherapy.  She will return to clinic in 1 week for anticipated cycle 7".   04-13-2021 [name], [name], PT (Physical therapy). Therapy for walking.   04-08-2021 Baird Lyons, PT (Physical therapy). Therapy for walking.   04-07-2021 Morrell Riddle, RD (Dietician). "Continue 6 cartons Osmolite 1.5 via tube split over 4 feedings daily with 90 ml water flush before and after feedings Patient will give additional 2 cups water flushes in between feeds to aid with abdominal fullness Continue Fairlife Shakes as tolerated  Continue  baking soda, salt water rinses several times daily Continue Lidocaine mouth rinse Discussed strategies for dry mouth  Continue daily stool softener for constipation Continue nausea medication as needed Provided 3 packs of split guaze to patient as she forgot to bring these with her today Provided pt pair of mesh underwear to aid with holding tube in place without use of tape Patient has contact information".    04-07-2021 Benay Pike, MD (Hematology/oncology). Infusion given. Follow up.   04-05-2021 [name], Melodie Bouillon, PT (Physical therapy). Therapy for walking.   04-01-2021 [name], PT(Rehab). Therapy for walking.    03-31-2021 Morrell Riddle, RD (Dietician). "Patient will increase tube feedings to 6 cartons Osmolite 1.5 split over 4 feedings/day. Flush with 90 ml water before and after each feeding.Drink by mouth or give via tube additional 2 cups fluids daily. This will provide 2130 kcal, 89 grams protein, 2286 ml total water Continue drinking Fairlife Shakes as tolerated for added calories and protein (150 kcal, 30 grams protein each) Discussed strategies for nausea, continue taking Zofran as needed Continue baking soda, salt water rinses several times daily Continue Lidocaine rinses Continue stool softener Provided support and encouragement Patient has contact information".   03-31-2021 Benay Pike, MD. (Hematology/oncology). Infusion given. Follow up.   03-30-2021 [name], Melodie Bouillon, PT (Physical therapy). Therapy for walking.   03-24-2021 Morrell Riddle, RD (Dietician). "Encouraged consuming soft moist high protein foods as tolerated - handout with recipes provided Increase tube feeding to 5 cartons Osmolite 1.5 split over 4 feedings/day Continue drinking Fairlife Shake daily (150 kcal, 30 grams protein) Encourage baking soda, salt water rinses several times daily - handout with recipes for sore mouth provided Continue Lidocaine mouth rinse  Continue stool softener".    03-24-2021 Benay Pike, MD (Hematology/Oncology). STOP augmentin, periogard, oxycodone.   03-17-2021  Benay Pike, MD (Hematology/Oncology). Follow up.   03-17-2021 Morrell Riddle, RD (Dietician). "Encouraged drinking oral nutrition supplement for added calories and protein - pt tried [employer] and did not like it, she has not tried the Ensure Complete.Continue 1 carton Osmolite 1.5 QID with 90 ml water flush before and after each feeding. Patient understands that she will increase tube feedings if weight decreases and unable to tolerate oral nutrition. Patient has contact information".    03-11-2021 Sharen Counter, CCC-SLP (Speech pathology). Therapy for swallowing.   03-11-2021 [name], [name], PT (physical therapy). Therapy for walking   03-09-2021 Benay Pike, MD (Hematology/oncology). Follow up.   03-05-2021 Marlene Bast, FNP (Surgery oncology). 6-week status post laparoscopic gastrostomy tube placement. 34 French G-tube exchanged at bedside for malfunctioning medicine port, likely over stretched by syringe tip. Follow-up with surgical oncology on as-needed basis. Call clinic if there is any continued difficulty with leaking ports or if patient no longer requires enteral access, and providers would like G-tube removed.   02-25-2021 Beverely Pace, LCSW (Social worker). "Discussed common feeling and emotions when being diagnosed with cancer, and the importance of support during treatment. Informed patient of the support team roles and support services at [facility]. Provided CSW contact information and encouraged patient to call with any questions or concerns. Referred to organization Support for People with Oral and Head and Neck Cancer    02-23-2021 Sandi Mariscal B, DMD (Dentistry). Oral treatment and plan.   02-23-2021 Benay Pike, MD (Hematology/oncology). Orders placed for IR imaging guided port insertion.   02-05-2021 Lennox Grumbles, MD (Oncology). "Discussed final pathology with the patient  which returned positive for pT3N3b p16- SCC with ENE present. The patient will proceed with adjuvant CCRT locally [facility] [place]) as scheduled.  - Wound care instructions were discussed with the patient in detail. Recommended she apply Vaseline or Aquaphor to the left free fibula donor site twice per day for the first 6 weeks. Further advised her to keep her left leg elevated at rest. She will need to wear the boot for a total of 6 weeks from surgery. - Follow up with me in 3 weeks for wound assessment".    02-05-2021 Gordy Councilman, MD  (Oncology). Post op check.   01-18-2021-01-21-2021 Josie Dixon. Laparoscopy and tracheostomy procedure.    Hospital visits:  Medication Reconciliation was completed by comparing discharge summary, patients EMR and Pharmacy list, and upon discussion with patient.   Admitted to the hospital on 05-17-2021 due to community acquired pneumonia. Discharge date was 05-18-2021. Discharged from [facility]Medications Started at [facility] [place] Discharge:?? Amoxicillin 875 mg per tube twice daily Azithromycin 250 mg per tube daily   Medication Changes at Hospital Discharge: None   Medications Discontinued at Hospital Discharge: Dexamethasone Omeprazole Ondansetron prochlorperazine   Medications that remain the same after Hospital Discharge:??  -All other medications will remain the same.     Objective:  Lab Results  Component Value Date   CREATININE 0.73 05/31/2021   BUN 19 05/31/2021   GFRNONAA >60 05/31/2021   GFRAA 83 08/26/2020   NA 138 05/31/2021   K 4.2 05/31/2021   CALCIUM 9.4 05/31/2021   CO2 27 05/31/2021   GLUCOSE 137 (H) 05/31/2021    Lab Results  Component Value Date/Time   HGBA1C  07/15/2009 05:45 PM    5.1 (NOTE) The ADA recommends the following therapeutic goal for glycemic control related to Hgb A1c measurement: Goal of therapy: <6.5 Hgb A1c  Reference: American Diabetes Association: Clinical Practice Recommendations 2010, Diabetes Care, 2010, 33: (Suppl  1).    Last diabetic Eye exam: No results found for: HMDIABEYEEXA  Last diabetic Foot exam: No results found for: HMDIABFOOTEX   Lab Results  Component Value Date   CHOL 144 04/20/2021   HDL 55 04/20/2021   LDLCALC 68 04/20/2021   TRIG 119 04/20/2021   CHOLHDL 2.6 04/20/2021    Hepatic Function Latest Ref Rng & Units 05/17/2021 05/07/2021 05/03/2021  Total Protein 6.5 - 8.1 g/dL 6.9 6.9 7.2  Albumin 3.5 - 5.0 g/dL 2.7(L) 2.1(L) 2.7(L)  AST 15 - 41 U/L 26 19 20   ALT 0 - 44 U/L  14 17 15   Alk Phosphatase 38 - 126 U/L 71 92 68  Total Bilirubin 0.3 - 1.2 mg/dL 0.4 0.3 0.2(L)    Lab Results  Component Value Date/Time   TSH 1.080 04/20/2021 01:56 PM   TSH 0.655 03/09/2021 01:07 PM   TSH 1.530 01/14/2021 10:08 AM    CBC Latest Ref Rng & Units 05/31/2021 05/18/2021 05/18/2021  WBC 4.0 - 10.5 K/uL 6.0 - 4.7  Hemoglobin 12.0 - 15.0 g/dL 10.5(L) 9.4(L) 7.6(L)  Hematocrit 36.0 - 46.0 % 32.6(L) 29.7(L) 23.6(L)  Platelets 150 - 400 K/uL 301 - 203    No results found for: VD25OH  Clinical ASCVD: No  The 10-year ASCVD risk score (Arnett DK, et al., 2019) is: 7.5%   Values used to calculate the score:     Age: 72 years     Sex: Female     Is Non-Hispanic African American: No     Diabetic:  No     Tobacco smoker: No     Systolic Blood Pressure: 939 mmHg     Is BP treated: Yes     HDL Cholesterol: 55 mg/dL     Total Cholesterol: 144 mg/dL    Depression screen PHQ 2/9 05/25/2021  Decreased Interest 1  Down, Depressed, Hopeless 0  PHQ - 2 Score 1     Other: (CHADS2VASc if Afib, MMRC or CAT for COPD, ACT, DEXA)  Social History   Tobacco Use  Smoking Status Former   Packs/day: 1.00   Years: 40.00   Pack years: 40.00   Types: Cigarettes   Quit date: 05/11/2009   Years since quitting: 12.1  Smokeless Tobacco Never   BP Readings from Last 3 Encounters:  05/31/21 (!) 116/47  05/25/21 110/68  05/19/21 (!) 131/57   Pulse Readings from Last 3 Encounters:  05/31/21 79  05/25/21 81  05/19/21 72   Wt Readings from Last 3 Encounters:  05/31/21 167 lb 6.4 oz (75.9 kg)  05/25/21 160 lb (72.6 kg)  05/19/21 162 lb 3.2 oz (73.6 kg)   BMI Readings from Last 3 Encounters:  05/31/21 27.02 kg/m  05/25/21 25.82 kg/m  05/19/21 26.18 kg/m    Assessment/Interventions: Review of patient past medical history, allergies, medications, health status, including review of consultants reports, laboratory and other test data, was performed as part of comprehensive  evaluation and provision of chronic care management services.   SDOH:  (Social Determinants of Health) assessments and interventions performed: Yes SDOH Interventions    Flowsheet Row Most Recent Value  SDOH Interventions   Financial Strain Interventions Intervention Not Indicated  Transportation Interventions Intervention Not Indicated      SDOH Screenings   Alcohol Screen: Low Risk    Last Alcohol Screening Score (AUDIT): 0  Depression (PHQ2-9): Low Risk    PHQ-2 Score: 1  Financial Resource Strain: Low Risk    Difficulty of Paying Living Expenses: Not hard at all  Food Insecurity: No Food Insecurity   Worried About Charity fundraiser in the Last Year: Never true   Ran Out of Food in the Last Year: Never true  Housing: Low Risk    Last Housing Risk Score: 0  Physical Activity: Inactive   Days of Exercise per Week: 0 days   Minutes of Exercise per Session: 0 min  Social Connections: Engineer, building services of Communication with Friends and Family: More than three times a week   Frequency of Social Gatherings with Friends and Family: More than three times a week   Attends Religious Services: More than 4 times per year   Active Member of [employer] or Organizations: Yes   Attends Music therapist: More than 4 times per year   Marital Status: Married  Stress: No Stress Concern Present   Feeling of Stress : Only a little  Tobacco Use: Medium Risk   Smoking Tobacco Use: Former   Smokeless Tobacco Use: Never   Passive Exposure: Not on file  Transportation Needs: No Transportation Needs   Lack of Transportation (Medical): No   Lack of Transportation (Non-Medical): No    CCM Care Plan  Allergies  Allergen Reactions   Codeine Other (See Comments)    Headache  HEADACHE   Oxycodone     Caused headaches.     Medications Reviewed Today     Reviewed by Lane Hacker, [facility] (Pharmacist) on 06/22/21 at 1152  Med List Status: <None>   Medication Order  Taking? Sig Documenting Provider Last Dose Status Informant  acetaminophen (TYLENOL) 500 MG tablet [id number]  Take 500 mg by mouth every 6 (six) hours as needed for mild pain. [provider]  Active Self  aspirin EC 81 MG tablet [id number] Yes Take 1 tablet (81 mg total) by mouth daily. Swallow whole. Sueanne Margarita, MD Taking Active Self  calcium carbonate (OS-CAL) 600 MG TABS [id number]  Take 600 mg by mouth daily. [provider]  Active Self  diltiazem (CARDIZEM LA) 360 MG 24 hr tablet [id number] Yes Take 1 tablet (360 mg total) by mouth daily. Tammy Duncans, PA-C Taking Active Self  gabapentin (NEURONTIN) 300 MG capsule [id number] Yes Take 300 mg by mouth 3 (three) times daily. [provider] Taking Active Self  levothyroxine (SYNTHROID) 112 MCG tablet [id number] Yes TAKE 1 TABLET BY MOUTH  DAILY  Patient taking differently: Take 112 mcg by mouth daily.   Tammy Duncans, PA-C Taking Active   metoprolol tartrate (LOPRESSOR) 50 MG tablet [id number] No TAKE 1 TABLET BY MOUTH ONE FOR 1 DOSE - TAKE 1 TABLET TWO HOURS PRIOR TO CT SCAN  Patient not taking: Reported on 06/22/2021   [provider] Not Taking Consider Medication Status and Discontinue   Morphine Sulfate (MORPHINE CONCENTRATE) 10 mg / 0.5 ml concentrated solution [id number] Yes Take 0.25-0.5 mLs (5-10 mg total) by mouth every 6 (six) hours as needed for severe pain. Truitt Merle, MD Taking Active Self  nitroGLYCERIN (NITROSTAT) 0.4 MG SL tablet [id number] Yes Place 1 tablet (0.4 mg total) under the tongue every 5 (five) minutes as needed.  Patient taking differently: Place 0.4 mg under the tongue every 5 (five) minutes as needed for chest pain.   Burnell Blanks, MD Taking Active Self  Nutritional Supplements (FEEDING SUPPLEMENT, OSMOLITE 1.5 CAL,) LIQD [id number] Yes Place 360 mLs into feeding tube in the morning, at noon, in the evening, and at bedtime. [provider] Taking Active Self   Polyethylene Glycol 3350 (MIRALAX PO) [id number] Yes Take 17 g by mouth daily as needed (constipation). [provider] Taking Active   simvastatin (ZOCOR) 40 MG tablet [id number] Yes TAKE 1 TABLET BY MOUTH AT  BEDTIME  Patient taking differently: Take 40 mg by mouth at bedtime.   Tammy Duncans, PA-C Taking Active Self  traZODone (DESYREL) 50 MG tablet [id number] Yes Take 1 tablet (50 mg total) by mouth at bedtime. Tammy Duncans, PA-C Taking Active Self            Patient Active Problem List   Diagnosis Date Noted   Encounter for general adult medical examination with abnormal findings 06/06/2021   Encounter for osteoporosis screening in asymptomatic postmenopausal patient 06/06/2021   Visit for screening mammogram 06/06/2021   Need for immunization against influenza 06/06/2021   Symptomatic anemia 05/18/2021   CAP (community acquired pneumonia) 05/17/2021   Normocytic anemia 05/17/2021   Moderate protein-calorie malnutrition ([place]) 05/17/2021   Hyponatremia 05/17/2021   G tube feedings ([place]) 04/29/2021   Aortic atherosclerosis ([place]) 04/25/2021   Chest pain of uncertain etiology 01/77/9390   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 chemotherapy ([place]) 04/07/2021   Mucositis due to antineoplastic therapy 03/31/2021   Drug induced constipation 03/31/2021   Chemotherapy-induced nausea 03/31/2021   Port-A-Cath in place 03/09/2021   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   Carcinoma of lower gum ([place]) 02/20/2021   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   Gastritis 12/08/2020   Anxiety 12/08/2020   Need  for prophylactic vaccination and inoculation against influenza 07/30/2020   Depression 11/26/2015   GERD (gastroesophageal reflux disease) 11/26/2015   Migraine headache 11/26/2015   Screening for diabetes mellitus 09/03/2015   Hypothyroidism 07/22/2009   Mixed hyperlipidemia 07/22/2009   Hypertension, essential 07/22/2009   CAD, NATIVE VESSEL  07/22/2009   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   Coronary artery spasm ([place]) 07/22/2009    Immunization History  Administered Date(s) Administered   Fluad Quad(high Dose 65+) 07/30/2020, 05/25/2021   Moderna Sars-Covid-2 Vaccination 09/05/2019, 10/03/2019   Pfizer Covid-19 Vaccine Bivalent Booster 68yr & up 05/25/2021   Pneumococcal Conjugate-13 06/25/2018   Pneumococcal Polysaccharide-23 07/01/2019    Conditions to be addressed/monitored:  Hypertension, Hyperlipidemia, and Cx  Care Plan : CCM Pharmacy Care Plan  Updates made by KLane Hacker RArdmoresince 06/22/2021 12:00 AM     Problem: Thyroid, cardio, lipids   Priority: High  Onset Date: 06/22/2021     Long-Range Goal: Disease State Management   Start Date: 06/22/2021  Expected End Date: 06/22/2022  This Visit's Progress: On track  Priority: High  Note:   Current Barriers:  Unable to independently monitor therapeutic efficacy  Pharmacist Clinical Goal(s):  Patient will contact provider office for questions/concerns as evidenced notation of same in electronic health record through collaboration with PharmD and provider.   Interventions: 1:1 collaboration with DMarge Duncans PA-C regarding development and update of comprehensive plan of care as evidenced by provider attestation and co-signature Inter-disciplinary care team collaboration (see longitudinal plan of care) Comprehensive medication review performed; medication list updated in electronic medical record  Hypertension (BP goal <140/90) -Controlled -Current treatment: Diltiazem 3611mQD -Medications previously tried: Metoprolol ER (1 time dose)  -Current home readings: Doesn't test -Current dietary habits: tube feeds so lots of liquids, shakes, mashed potatoes, etc -Current exercise habits: none -Denies hypotensive/hypertensive symptoms -Educated on BP goals and benefits of medications for prevention of heart attack, stroke and kidney  damage; -Counseled to monitor BP at home PRN, document, and provide log at future appointments -Recommended to continue current medication  Hyperlipidemia: (LDL goal < 100) The 10-year ASCVD risk score (Arnett DK, et al., 2019) is: 7.5%   Values used to calculate the score:     Age: 89ears     Sex: Female     Is Non-Hispanic African American: No     Diabetic: No     Tobacco smoker: No     Systolic Blood Pressure: 11546mHg     Is BP treated: Yes     HDL Cholesterol: 55 mg/dL     Total Cholesterol: 144 mg/dL Lab Results  Component Value Date   CHOL 144 04/20/2021   CHOL 132 12/29/2020   CHOL 162 07/30/2020   Lab Results  Component Value Date   HDL 55 04/20/2021   HDL 59 12/29/2020   HDL 68 07/30/2020   Lab Results  Component Value Date   LDLCALC 68 04/20/2021   LDLCALC 55 12/29/2020   LDLCALC 82 07/30/2020   Lab Results  Component Value Date   TRIG 119 04/20/2021   TRIG 95 12/29/2020   TRIG 63 07/30/2020   Lab Results  Component Value Date   CHOLHDL 2.6 04/20/2021   CHOLHDL 2.2 12/29/2020   CHOLHDL 2.4 07/30/2020  No results found for: LDLDIRECT -Controlled -Current treatment: Simvastatin 4058mMedications previously tried: N/A  -Current dietary habits: tube feeds so lots of liquids, shakes, mashed potatoes, etc -Current exercise habits: none -Educated on Cholesterol goals;  -Recommended  to continue current medication  Thyroid (Goal TSH: 0.4-5.0) Lab Results  Component Value Date   TSH 1.080 04/20/2021  -Controlled -Current treatment: Levothyroxine 154mg -Counseled to take medication on an empty stomach -Recommended to continue current medication  Oral Squamous cell carcinoma (Goal: wants to be able to eat a piece of pizza again) -Has no teeth left, mouth is very inflamed, and only able to eat shakes. Is using feeding tube. It's hard to swallow and she can't chew -Controlled -Current treatment  Sx in July 2022 Chemo/Radiation (Finished October  2022) -Medications previously tried: N/A  -Recommended to continue current medication    Patient Goals/Self-Care Activities Patient will:  - take medications as prescribed as evidenced by patient report and record review  Follow Up Plan: The patient has been provided with contact information for the care management team and has been advised to call with any health related questions or concerns.   CPP F/U July 2023  [place] Constable PSherian ReinD. - [phone]       Medication Assistance: None required.  Patient affirms current coverage meets needs.  Compliance/Adherence/Medication fill history: Care Gaps: None   Star Rating Drugs: Simvastatin 40 mg- Last filled MAndersonPharmacist Assistant [phone] Patient's preferred pharmacy is:  [id number] [place]Phone: [phone]: [phone] Uses pill box? Yes Pt endorses 100% compliance  We discussed: Current pharmacy is preferred with insurance plan and patient is satisfied with pharmacy services Patient decided to: Continue current medication management strategy  Care Plan and Follow Up Patient Decision:  Patient agrees to Care Plan and Follow-up.  Plan: The patient has been provided with contact information for the care management team and has been advised to call with any health related questions or concerns.   CPP F/U July 2023  [place] Constable PSherian ReinD. -- [phone]

## 2021-06-22 NOTE — Patient Instructions (Signed)
Visit Information   Goals Addressed             This Visit's Progress    Manage My Medicine       Timeframe:  Long-Range Goal Priority:  High Start Date:                             Expected End Date:                       Follow Up Date 06/22/22   - use a pillbox to sort medicine    Why is this important?   These steps will help you keep on track with your medicines.   Notes:      Track and Manage My Blood Pressure-Hypertension       Timeframe:  Long-Range Goal Priority:  High Start Date:                             Expected End Date:                       Follow Up Date 06/22/22   - choose a place to take my blood pressure (home, clinic or office, retail store) - write blood pressure results in a log or diary    Why is this important?   You won't feel high blood pressure, but it can still hurt your blood vessels.  High blood pressure can cause heart or kidney problems. It can also cause a stroke.  Making lifestyle changes like losing a little weight or eating less salt will help.  Checking your blood pressure at home and at different times of the day can help to control blood pressure.  If the doctor prescribes medicine remember to take it the way the doctor ordered.  Call the office if you cannot afford the medicine or if there are questions about it.     Notes:        Patient Care Plan: CCM Pharmacy Care Plan     Problem Identified: Thyroid, cardio, lipids   Priority: High  Onset Date: 06/22/2021     Long-Range Goal: Disease State Management   Start Date: 06/22/2021  Expected End Date: 06/22/2022  This Visit's Progress: On track  Priority: High  Note:   Current Barriers:  Unable to independently monitor therapeutic efficacy  Pharmacist Clinical Goal(s):  Patient will contact provider office for questions/concerns as evidenced notation of same in electronic health record through collaboration with PharmD and provider.   Interventions: 1:1  collaboration with Marge Duncans, PA-C regarding development and update of comprehensive plan of care as evidenced by provider attestation and co-signature Inter-disciplinary care team collaboration (see longitudinal plan of care) Comprehensive medication review performed; medication list updated in electronic medical record  Hypertension (BP goal <140/90) -Controlled -Current treatment: Diltiazem 360mg  QD -Medications previously tried: Metoprolol ER (1 time dose)  -Current home readings: Doesn't test -Current dietary habits: tube feeds so lots of liquids, shakes, mashed potatoes, etc -Current exercise habits: none -Denies hypotensive/hypertensive symptoms -Educated on BP goals and benefits of medications for prevention of heart attack, stroke and kidney damage; -Counseled to monitor BP at home PRN, document, and provide log at future appointments -Recommended to continue current medication  Hyperlipidemia: (LDL goal < 100) The 10-year ASCVD risk score (Arnett DK, et al., 2019) is: 7.5%   Values used  to calculate the score:     Age: 70 years     Sex: Female     Is Non-Hispanic African American: No     Diabetic: No     Tobacco smoker: No     Systolic Blood Pressure: 585 mmHg     Is BP treated: Yes     HDL Cholesterol: 55 mg/dL     Total Cholesterol: 144 mg/dL Lab Results  Component Value Date   CHOL 144 04/20/2021   CHOL 132 12/29/2020   CHOL 162 07/30/2020   Lab Results  Component Value Date   HDL 55 04/20/2021   HDL 59 12/29/2020   HDL 68 07/30/2020   Lab Results  Component Value Date   LDLCALC 68 04/20/2021   LDLCALC 55 12/29/2020   LDLCALC 82 07/30/2020   Lab Results  Component Value Date   TRIG 119 04/20/2021   TRIG 95 12/29/2020   TRIG 63 07/30/2020   Lab Results  Component Value Date   CHOLHDL 2.6 04/20/2021   CHOLHDL 2.2 12/29/2020   CHOLHDL 2.4 07/30/2020  No results found for: LDLDIRECT -Controlled -Current treatment: Simvastatin 40mg  -Medications  previously tried: N/A  -Current dietary habits: tube feeds so lots of liquids, shakes, mashed potatoes, etc -Current exercise habits: none -Educated on Cholesterol goals;  -Recommended to continue current medication  Thyroid (Goal TSH: 0.4-5.0) Lab Results  Component Value Date   TSH 1.080 04/20/2021  -Controlled -Current treatment: Levothyroxine 163mcg -Counseled to take medication on an empty stomach -Recommended to continue current medication  Oral Squamous cell carcinoma (Goal: wants to be able to eat a piece of pizza again) -Has no teeth left, mouth is very inflamed, and only able to eat shakes. Is using feeding tube. It's hard to swallow and she can't chew -Controlled -Current treatment  Sx in July 2022 Chemo/Radiation (Finished October 2022) -Medications previously tried: N/A  -Recommended to continue current medication    Patient Goals/Self-Care Activities Patient will:  - take medications as prescribed as evidenced by patient report and record review  Follow Up Plan: The patient has been provided with contact information for the care management team and has been advised to call with any health related questions or concerns.   CPP F/U July 2023  [place] Constable, Sherian Rein.D. - [phone]      Ms. Pickron was given information about Chronic Care Management services today including:  CCM service includes personalized support from designated clinical staff supervised by her physician, including individualized plan of care and coordination with other care providers 24/7 contact phone numbers for assistance for urgent and routine care needs. Standard insurance, coinsurance, copays and deductibles apply for chronic care management only during months in which we provide at least 20 minutes of these services. Most insurances cover these services at 100%, however patients may be responsible for any copay, coinsurance and/or deductible if applicable. This service may help you  avoid the need for more expensive face-to-face services. Only one practitioner may furnish and bill the service in a calendar month. The patient may stop CCM services at any time (effective at the end of the month) by phone call to the office staff.  Patient agreed to services and verbal consent obtained.   The patient verbalized understanding of instructions, educational materials, and care plan provided today and declined offer to receive copy of patient instructions, educational materials, and care plan.  The pharmacy team will reach out to the patient again over the next 90 days.   Arlester Marker  Merrilyn Puma, [facility]

## 2021-06-23 ENCOUNTER — Other Ambulatory Visit: Payer: Self-pay

## 2021-06-23 ENCOUNTER — Encounter: Payer: Self-pay | Admitting: Physical Therapy

## 2021-06-23 ENCOUNTER — Ambulatory Visit: Payer: Medicare Other | Admitting: Physical Therapy

## 2021-06-23 DIAGNOSIS — C060 Malignant neoplasm of cheek mucosa: Secondary | ICD-10-CM | POA: Diagnosis not present

## 2021-06-23 DIAGNOSIS — L599 Disorder of the skin and subcutaneous tissue related to radiation, unspecified: Secondary | ICD-10-CM

## 2021-06-23 DIAGNOSIS — M6281 Muscle weakness (generalized): Secondary | ICD-10-CM

## 2021-06-23 DIAGNOSIS — M25611 Stiffness of right shoulder, not elsewhere classified: Secondary | ICD-10-CM | POA: Diagnosis not present

## 2021-06-23 DIAGNOSIS — R262 Difficulty in walking, not elsewhere classified: Secondary | ICD-10-CM | POA: Diagnosis not present

## 2021-06-23 DIAGNOSIS — M25612 Stiffness of left shoulder, not elsewhere classified: Secondary | ICD-10-CM

## 2021-06-23 DIAGNOSIS — Z483 Aftercare following surgery for neoplasm: Secondary | ICD-10-CM | POA: Diagnosis not present

## 2021-06-23 DIAGNOSIS — I890 Lymphedema, not elsewhere classified: Secondary | ICD-10-CM | POA: Diagnosis not present

## 2021-06-23 NOTE — Therapy (Signed)
[place] @ [place], [place], [address] Phone: [phone]   Fax:  [phone]  Physical Therapy Treatment  Patient Details  Name: Tammy Brady MRN: [id number] Date of Birth: [birth year] Referring Provider (PT): Reita May Date: 06/23/2021   PT End of Session - 06/23/21 1051     Visit Number 15    Number of Visits 21    Date for PT Re-Evaluation 07/07/21    PT Start Time 1003    PT Stop Time 1050    PT Time Calculation (min) 47 min    Activity Tolerance Patient tolerated treatment well    Behavior During Therapy [facility] for tasks assessed/performed             Past Medical History:  Diagnosis Date   Aortic atherosclerosis ([place])    CAD (coronary artery disease)    Mild non-obstructive disease by cath January 2011. Coronary CTA showed  mild (25-49%) plaque in the RCA and minimal (<25%) coronary CTA 04/2021 with coronary Ca score 257   Carotid artery disease (HCC)    Followed by Dr Kellie Simmering   GERD (gastroesophageal reflux disease)    Hyperlipidemia    Hypertension    Hypothyroidism     Past Surgical History:  Procedure Laterality Date   APPENDECTOMY     IR IMAGING GUIDED PORT INSERTION  03/02/2021    There were no vitals filed for this visit.   Subjective Assessment - 06/23/21 1003     Subjective I used the pump again this morning and I used it yesterday.    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 CT neck same day revealed an enhancing soft tissue lesion along the buccal margin of the left mandible; with lucency in the adjacent bone consistent with known SCC. Also found were necrotic left level IA/B and II/IIIB lymph nodes, noted to be concerning for nodal metastasis. During physical exam performed at this visit, Dr. Amada Jupiter visualized the exophytic mass in and around the left mandibular  canine, premolar, and first molar, along with an enlarged level 1A lymph node. The patient opted to proceed with multiple oral biopsies, mandibular resectioning, and lymphadenectomies, 01/18/21 Resection performed on 01/18/21 revealed the following: 3.3cm tumor, Gr 2 Squamous cell carcinoma of alveolar ridge extending to buccal gingival junction and lingual sulcus, DOI 60mm, neg margins, 6/49 LN+ bilaterally with ECE; no PNI or LVSI; She also underwent Left fibula osteocutaneous free flap and ORIF mandible, will receive 30 fractions of radiation to her oral cavity and bilateral neck and 6 weeks of cisplatin chemotherapy. She started on 8/29 and will complete 04/19/21, PEG placed at [facility] on 01/18/21 and exchanged on 8/5 due to leakage. She uses PEG for most nutrition and medication. She is able to tolerate pureed foods but her reconstructive flap limits her swallowing, PAC [facility] on 03/02/21, reports a 40 pack year smoking history, quitting off and on over the years, stopped smoking in May 2022.    Patient Stated Goals to gain info from providers    Currently in Pain? No/denies    Pain Score 0-No pain                               [facility] Adult PT Treatment/Exercise - 06/23/21 0001       Manual Therapy  Myofascial Release gentle myofascial release to crevice just under chin to help improve lymphatic flow    Manual Lymphatic Drainage (MLD) in supine with head of bed elevated: short neck, 5 diaphragmatic breaths, bilateral axillary nodes, bilateral pectoral nodes, anterior chest, short neck, posterior, lateral and anterior neck moving fluid towards pathways, submental and submandibular  areas and sides of face moving fluid towards pathways then retracing all steps                          PT Long Term Goals - 06/09/21 1051       PT LONG TERM GOAL #1   Title Pt will be independent in self MLD for long term management of lymphedema.    Baseline 05/11/21- pt has  not been able to try this due to increased fatigue and recent illness; 06/09/21 - have been instructing pt and she has been trying this at home but is not independent yet    Time 6    Period Weeks    Status On-going      PT LONG TERM GOAL #2   Title Pt will obtain appropriate compression garments for long term management of lymphedema.    Time 6    Period Weeks    Status On-going      PT LONG TERM GOAL #3   Title Pt will report she is able to descend steps with a step through gait pattern and a decreased fear of falling.    Baseline currently step to; 05/30/21- pt reports she is now able to do this    Time 6    Period Weeks    Status Achieved      PT LONG TERM GOAL #4   Title Pt will be independent in a home exercise program for continued stretching and strengthening.    Time 6    Period Weeks    Status On-going      PT LONG TERM GOAL #5   Title Pt will report her shoulder ROM has improved and she is no longer having trouble getting dressed or reaching overhead.    Baseline 05/11/21- pt still has difficulty getting things overhead    Time 6    Period Weeks    Status On-going      PT LONG TERM GOAL #6   Title Pt will be able to complete 13 sit to stands in 30 seconds without use of UEs to decrease fall risk.    Baseline 7; 06/09/21- not assessed today    Time 6    Period Weeks    Status On-going                   Plan - 06/23/21 1052     Clinical Impression Statement Pt was trained on the FlexiTouch compression pump. She used it yesterday and today. Her anterior neck is noticeably softer with improved skin texture. She still has a fullness of her chin which is most likely due to inability of her lymphatic fluid to travel down anterior neck due to crevice just under her chin which may be a scar from surgery or a result of reconstruction (pt is not sure). Worked today to mobilize this area to help improve drainage. Continued with MLD to anterior neck, chin and face.     PT Frequency 2x / week    PT Duration 6 weeks    PT Treatment/Interventions ADLs/Self Care Home Management;Therapeutic exercise;Balance training;Neuromuscular re-education;Stair training;Gait training;Patient/family education;Orthotic  Fit/Training;Manual techniques;Manual lymph drainage;Compression bandaging;Scar mobilization;Passive range of motion;Taping;Vasopneumatic Device;Joint Manipulations    PT Next Visit Plan cont MLD,  PROM to neck, once lymphedema is managed with garments and pump begin bilateral shoulder ROM and LE strengthening and balance    PT Home Exercise Plan head and neck ROM exercises,Access Code [email or web address]    Consulted and Agree with Plan of Care Patient             Patient will benefit from skilled therapeutic intervention in order to improve the following deficits and impairments:  Postural dysfunction, Impaired UE functional use, Impaired flexibility, Increased fascial restricitons, Decreased strength, Decreased knowledge of use of DME, Decreased knowledge of precautions, Decreased range of motion, Decreased scar mobility, Increased edema, Difficulty walking, Decreased balance  Visit Diagnosis: Lymphedema, not elsewhere classified  Aftercare following surgery for neoplasm  Muscle weakness (generalized)  Difficulty in walking, not elsewhere classified  Stiffness of left shoulder, not elsewhere classified  Disorder of the skin and subcutaneous tissue related to radiation, unspecified  Stiffness of right shoulder, not elsewhere classified  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HCC)     Problem List Patient Active Problem List   Diagnosis Date Noted   Encounter for general adult medical examination with abnormal findings 06/06/2021   Encounter for osteoporosis screening in asymptomatic postmenopausal patient 06/06/2021   Visit for screening mammogram 06/06/2021   Need for immunization against influenza 06/06/2021   Symptomatic anemia 05/18/2021   CAP  (community acquired pneumonia) 05/17/2021   Normocytic anemia 05/17/2021   Moderate protein-calorie malnutrition ([place]) 05/17/2021   Hyponatremia 05/17/2021   G tube feedings ([place]) 04/29/2021   Aortic atherosclerosis ([place]) 04/25/2021   Chest pain of uncertain etiology 62/86/3817   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 chemotherapy ([place]) 04/07/2021   Mucositis due to antineoplastic therapy 03/31/2021   Drug induced constipation 03/31/2021   Chemotherapy-induced nausea 03/31/2021   Port-A-Cath in place 03/09/2021   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   Carcinoma of lower gum ([place]) 02/20/2021   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   Gastritis 12/08/2020   Anxiety 12/08/2020   Need for prophylactic vaccination and inoculation against influenza 07/30/2020   Depression 11/26/2015   GERD (gastroesophageal reflux disease) 11/26/2015   Migraine headache 11/26/2015   Screening for diabetes mellitus 09/03/2015   Hypothyroidism 07/22/2009   Mixed hyperlipidemia 07/22/2009   Hypertension, essential 07/22/2009   CAD, NATIVE VESSEL 07/22/2009   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

## 2021-06-25 ENCOUNTER — Encounter (HOSPITAL_COMMUNITY): Payer: Self-pay | Admitting: Dentistry

## 2021-06-25 ENCOUNTER — Ambulatory Visit (INDEPENDENT_AMBULATORY_CARE_PROVIDER_SITE_OTHER): Payer: Medicare Other | Admitting: Dentistry

## 2021-06-25 ENCOUNTER — Other Ambulatory Visit: Payer: Self-pay

## 2021-06-25 VITALS — BP 117/55 | HR 64 | Temp 98.3°F | Wt 160.0 lb

## 2021-06-25 DIAGNOSIS — Z0120 Encounter for dental examination and cleaning without abnormal findings: Secondary | ICD-10-CM | POA: Diagnosis not present

## 2021-06-25 DIAGNOSIS — Z923 Personal history of irradiation: Secondary | ICD-10-CM

## 2021-06-25 DIAGNOSIS — R252 Cramp and spasm: Secondary | ICD-10-CM

## 2021-06-25 DIAGNOSIS — Y842 Radiological procedure and radiotherapy as the cause of abnormal reaction of the patient, or of later complication, without mention of misadventure at the time of the procedure: Secondary | ICD-10-CM | POA: Diagnosis not present

## 2021-06-25 DIAGNOSIS — R432 Parageusia: Secondary | ICD-10-CM

## 2021-06-25 DIAGNOSIS — K117 Disturbances of salivary secretion: Secondary | ICD-10-CM | POA: Diagnosis not present

## 2021-06-25 MED ORDER — SODIUM FLUORIDE 1.1 % DT GEL: DENTAL | 11 refills | Status: DC

## 2021-06-25 NOTE — Progress Notes (Signed)
Department of Dental Medicine      LIMITED EXAM  Service Date:   06/25/2021  Patient Name:   Tammy Brady Date of Birth:   10/06/1950 Medical Record Number: [id number]   PLAN/RECOMMENDATIONS   Assessment Trismus, dysgeusia, xerostomia Incipient lesions  Recommendations Brush after meals and at bedtime. Use fluoride at bedtime. Use trismus exercises as directed. Use CLoSYS & warm salt water/baking soda rinses as needed. Take multiple sips of water as needed. Try using ACT mouthrinse for daily use (over-the-counter).  Plan Follow-up as needed.  Rx:  Sodium fluoride 1.1% gel Establish care at an outside dental office  for routine dental care including replacement of missing teeth as needed, cleanings/periodontal therapy and exams. Call if any questions or concerns arise.   06/25/2021 Progress Note:  COVID-19 SCREENING:  The patient denies symptoms concerning for COVID-19 infection including fever, chills, cough, or newly developed shortness of breath.   HISTORY OF PRESENT ILLNESS: Tammy Brady is a 70 y.o. female who presents today for an oral examination after chemoradiation therapy for mandibular cancer and for delivery of upper fluoride tray.  The patient has completed 30 of 30 radiation treatments from 03/08/21 to 04/19/21.  They have completed 6 chemotherapy treatments.   REVIEW OF CHIEF COMPLAINTS: Dry mouth:  Occasional symptoms currently, however she believes this is worsening. Hard to swallow: No Hurts to swallow:  No Taste changes:  Yes, still has some loss of taste Sores in mouth:  No Limited opening:  Yes, she says that the left side of her jaw hurts some when she tries to open too wide. Weight loss:  Minimal if any 160 lbs prior to therapy and has maintained   AT- HOME ORAL HYGIENE REGIMEN: Brushing:  She is now able to brush twice a day with a toothbrush. She uses children's toothpaste because the mint flavoring in most standard  toothpastes burns her mouth. Flossing:  No Rinsing:  Yes, using salt water and baking soda as needed Fluoride treatments:  No, but will receive upper fluoride tray today Trismus exercises:  No; she was not able to do the exercises during radiation or after because her mouth was very sore and it was painful to touch or manipulate it too much.  Maximum Interincisal Opening:  22 mm down from 28 mm   Patient Active Problem List   Diagnosis Date Noted   Encounter for general adult medical examination with abnormal findings 06/06/2021   Encounter for osteoporosis screening in asymptomatic postmenopausal patient 06/06/2021   Visit for screening mammogram 06/06/2021   Need for immunization against influenza 06/06/2021   Symptomatic anemia 05/18/2021   CAP (community acquired pneumonia) 05/17/2021   Normocytic anemia 05/17/2021   Moderate protein-calorie malnutrition ([place]) 05/17/2021   Hyponatremia 05/17/2021   G tube feedings ([place]) 04/29/2021   Aortic atherosclerosis ([place]) 04/25/2021   Chest pain of uncertain etiology 09/47/0962   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 chemotherapy ([place]) 04/07/2021   Mucositis due to antineoplastic therapy 03/31/2021   Drug induced constipation 03/31/2021   Chemotherapy-induced nausea 03/31/2021   Port-A-Cath in place 03/09/2021   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   Carcinoma of lower gum ([place]) 02/20/2021   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   Gastritis 12/08/2020   Anxiety 12/08/2020   Need for prophylactic vaccination and inoculation against influenza 07/30/2020   Depression 11/26/2015   GERD (  gastroesophageal reflux disease) 11/26/2015   Migraine headache 11/26/2015   Screening for diabetes mellitus 09/03/2015   Hypothyroidism 07/22/2009   Mixed hyperlipidemia 07/22/2009   Hypertension, essential 07/22/2009   CAD, NATIVE VESSEL 07/22/2009   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   Coronary artery spasm  ([place]) 07/22/2009   Past Medical History:  Diagnosis Date   Aortic atherosclerosis ([place])    CAD (coronary artery disease)    Mild non-obstructive disease by cath January 2011. Coronary CTA showed  mild (25-49%) plaque in the RCA and minimal (<25%) coronary CTA 04/2021 with coronary Ca score 257   Carotid artery disease (HCC)    Followed by Dr Kellie Simmering   GERD (gastroesophageal reflux disease)    Hyperlipidemia    Hypertension    Hypothyroidism    Past Surgical History:  Procedure Laterality Date   APPENDECTOMY     IR IMAGING GUIDED PORT INSERTION  03/02/2021   Current Outpatient Medications  Medication Sig Dispense Refill   acetaminophen (TYLENOL) 500 MG tablet Take 500 mg by mouth every 6 (six) hours as needed for mild pain.     aspirin EC 81 MG tablet Take 1 tablet (81 mg total) by mouth daily. Swallow whole. 90 tablet 3   calcium carbonate (OS-CAL) 600 MG TABS Take 600 mg by mouth daily.     diltiazem (CARDIZEM LA) 360 MG 24 hr tablet Take 1 tablet (360 mg total) by mouth daily. 30 tablet 3   gabapentin (NEURONTIN) 300 MG capsule Take 300 mg by mouth 3 (three) times daily.     levothyroxine (SYNTHROID) 112 MCG tablet TAKE 1 TABLET BY MOUTH  DAILY (Patient taking differently: Take 112 mcg by mouth daily.) 90 tablet 0   metoprolol tartrate (LOPRESSOR) 50 MG tablet TAKE 1 TABLET BY MOUTH ONE FOR 1 DOSE - TAKE 1 TABLET TWO HOURS PRIOR TO CT SCAN (Patient not taking: Reported on 06/22/2021)     Morphine Sulfate (MORPHINE CONCENTRATE) 10 mg / 0.5 ml concentrated solution Take 0.25-0.5 mLs (5-10 mg total) by mouth every 6 (six) hours as needed for severe pain. 30 mL 0   nitroGLYCERIN (NITROSTAT) 0.4 MG SL tablet Place 1 tablet (0.4 mg total) under the tongue every 5 (five) minutes as needed. (Patient taking differently: Place 0.4 mg under the tongue every 5 (five) minutes as needed for chest pain.) 25 tablet 6   Nutritional Supplements (FEEDING SUPPLEMENT, OSMOLITE 1.5 CAL,) LIQD Place 360 mLs  into feeding tube in the morning, at noon, in the evening, and at bedtime.     Polyethylene Glycol 3350 (MIRALAX PO) Take 17 g by mouth daily as needed (constipation).     simvastatin (ZOCOR) 40 MG tablet TAKE 1 TABLET BY MOUTH AT  BEDTIME (Patient taking differently: Take 40 mg by mouth at bedtime.) 90 tablet 0   traZODone (DESYREL) 50 MG tablet Take 1 tablet (50 mg total) by mouth at bedtime. 90 tablet 1   No current facility-administered medications for this visit.   Allergies  Allergen Reactions   Codeine Other (See Comments)    Headache  HEADACHE   Oxycodone     Caused headaches.     VITALS: BP (!) 117/55 (BP Location: Right Arm, Patient Position: Sitting, Cuff Size: Normal)    Pulse 64    Temp 98.3 F (36.8 C) (Oral)    Wt 160 lb (72.6 kg)    BMI 25.82 kg/m    DENTAL EXAM: Oral hygiene (plaque):  Slight generalized accumulation Mucositis:  No, but she  did struggle with this significantly during radiation Description of saliva:  Normal appearance, lesser quantity  Exposed bone:  No Other watched areas:  Starting to develop some incipient lesions   ASSESSMENT/INDICATIONS: Patient with history of radiation therapy to the head and neck region.  Xerostomia:  She did not have any issues with dry mouth until recently. She had quite the opposite right after she finished radiation, having excess salivary secretions/mucous. This has resolved and she says that she is noticing it is the worst at night, but feels like it is continuing to worsen during the daytime as well. Recommended trying OTC brand CLoSYS to manage symptoms and take multiple sips of water as needed.  Dysgeusia:  She still has some loss of taste and sensitivities to some foods/drinks. She cannot tolerate mint flavored toothpaste or mouthrinse. Recommended ACT mouthrinse which is also available OTC to use when needed.  Trismus:  This is probably her most significant side effect. She has lost 6 mm of maximum jaw opening  since before starting radiation because she was unable to do her trismus exercises. Discussed attempting to exercise her jaw muscle as much as she can tolerate to avoid worsening of symptom.   PROCEDURES: Delivery of upper fluoride tray. Appliances was tried in and adjusted as needed.  Polished. Postoperative instructions were provided in a written and verbal format concerning the use and care of appliances.   PLAN AND RECOMMENDATIONS: Brush after meals and at bedtime.  Use fluoride at bedtime. Use trismus exercises as directed. Use CLoSYS and salt water/baking soda rinses as needed for dry mouth. Take multiple sips of water as needed. Rx: Sodium fluoride 1.1% gel to use with fluoride tray treatments Return to regular dentist for routine dental care including cleanings, periodic exams and replacement of missing teeth as needed.  If the patient does not have a regular dentist, establish care at an outside office of their choice. Follow-up as needed in the hospital clinic.   Call if any problems or concerns arise.  All questions and concerns were invited and addressed.  The patient tolerated today's visit well and departed in stable condition.     [place] Benson [place], D.M.D.

## 2021-06-25 NOTE — Patient Instructions (Addendum)
Tammy Brady, D.M.D. Phone: [phone] Fax: [phone]   It was a pleasure seeing you today!  Please refer to the information below regarding your dental visit with [place].  Call us if any questions or concerns come up after you leave.   Thank you for giving [place] the opportunity to provide care for you.  If there is anything we can do for you, please let us know.    RECOMMENDATIONS: Brush after meals and at bedtime.  Floss once a day, and use fluoride at bedtime. Use trismus exercises as directed below. Use CLOSYs for dry mouth, and salt water/baking soda rinses to help with any mouth sores.  Try using ACT over-the-counter mouth rinse if brushing your teeth well twice a day is difficult. Take multiple sips of water as needed.  Stay hydrated. Return to your regular dentist for routine dental care including cleanings and periodic exams.  If you do not have a regular dentist, it is important to establish care at an outside dental office of your choice.  Call us if any problems or concerns arise.    TRISMUS: Trismus is a condition where the jaw does not allow the mouth to open as wide as it usually does.  This can happen almost suddenly, or in other cases the process is so slow, it is hard to notice it-until it is too far along.  When the jaw joints and/or muscles have been exposed to radiation treatments, the onset of trismus is very slow.  This is because the muscles are losing their stretching ability over a long period of time, as long as 2 YEARS after the end of radiation.  It is therefore important to exercise these muscles and joints.  Trismus exercises: Use the stack of tongue depressors measuring the same or a little less than your maximum opening that was recorded at your first dental visit. Place the stack in your mouth, supporting the other end with your hand. Allow 30 seconds for muscle stretching. Rest for a few seconds. Repeat  3-5 times. For all radiation patients, this exercise is recommended in the mornings and evenings unless otherwise instructed. The exercises should be done for a period of 2 YEARS after the end of radiation. Maximum jaw opening should be checked routinely on recall dental visits by your general dentist. Report any changes, soreness, or difficulties encountered when doing the exercises to your dentist.   Tammy Brady   How do I use my fluoride trays? The best time to use your Fluoride is in the morning after breakfast, or right before bed time.  You must brush your teeth very well and floss before using the Fluoride in order to get the best use out of the Fluoride treatments. Place 1 drop of Fluoride in each tooth space in the tray.  You do not need to use more. Place the tray(s) over your teeth.  Make sure the trays are seated all the way.  Remember, they only fit one way on your teeth. Clench your teeth together a few times with light chewing motions to distribute the fluoride. Leave the trays in place for a full 5 minutes.  You may drool a bit, so keep a tissue handy. At the end of the 5 minutes, take the trays out.  SPIT OUT the remaining fluoride, but DO NOT RINSE. DO NOT eat or drink after treatments for at least 30 minutes.  This is why the best time for your treatments  is before bedtime.  How do I care for my fluoride trays? Clean the inside of your Fluoride trays using cold water and a toothbrush. In order to keep your trays from discoloring and free from odors, soak them overnight in denture cleaners or soap and water.  Do not use bleach or non-denture products. Store the trays in a safe dry place AWAY from any heat until your next treatment.  Finally, be sure to let your pharmacy know when you are close to needing a new refill for your fluoride prescription so they have it ready for you without interruption of fluoride use.    QUESTIONS? If anything happens to your  Fluoride trays, or they don't fit as well after any dental work, please let us know as soon as possible.  Call the [facility] to schedule an appointment at [phone].

## 2021-06-28 ENCOUNTER — Encounter: Payer: Self-pay | Admitting: Physical Therapy

## 2021-06-28 ENCOUNTER — Ambulatory Visit: Payer: Medicare Other | Admitting: Physical Therapy

## 2021-06-28 ENCOUNTER — Other Ambulatory Visit: Payer: Self-pay

## 2021-06-28 DIAGNOSIS — R262 Difficulty in walking, not elsewhere classified: Secondary | ICD-10-CM

## 2021-06-28 DIAGNOSIS — M6281 Muscle weakness (generalized): Secondary | ICD-10-CM

## 2021-06-28 DIAGNOSIS — M25612 Stiffness of left shoulder, not elsewhere classified: Secondary | ICD-10-CM | POA: Diagnosis not present

## 2021-06-28 DIAGNOSIS — L599 Disorder of the skin and subcutaneous tissue related to radiation, unspecified: Secondary | ICD-10-CM

## 2021-06-28 DIAGNOSIS — C060 Malignant neoplasm of cheek mucosa: Secondary | ICD-10-CM | POA: Diagnosis not present

## 2021-06-28 DIAGNOSIS — M25611 Stiffness of right shoulder, not elsewhere classified: Secondary | ICD-10-CM | POA: Diagnosis not present

## 2021-06-28 DIAGNOSIS — I890 Lymphedema, not elsewhere classified: Secondary | ICD-10-CM | POA: Diagnosis not present

## 2021-06-28 DIAGNOSIS — Z483 Aftercare following surgery for neoplasm: Secondary | ICD-10-CM | POA: Diagnosis not present

## 2021-06-28 NOTE — Therapy (Addendum)
[facility] [address] [place], [place], [address] Phone: [phone]   Fax:  [phone]  Physical Therapy Treatment  Patient Details  Name: Tammy Brady MRN: [id number] Date of Birth: 01/31/1951 Referring Provider (PT): Isidore Moos   Encounter Date: 06/28/2021   PT End of Session - 06/28/21 1050     Visit Number 16    Number of Visits 21    Date for PT Re-Evaluation 07/07/21    PT Start Time 0959    PT Stop Time 1050    PT Time Calculation (min) 51 min    Activity Tolerance Patient tolerated treatment well    Behavior During Therapy [facility] for tasks assessed/performed             Past Medical History:  Diagnosis Date   Aortic atherosclerosis ([place])    CAD (coronary artery disease)    Mild non-obstructive disease by cath January 2011. Coronary CTA showed  mild (25-49%) plaque in the RCA and minimal (<25%) coronary CTA 04/2021 with coronary Ca score 257   Carotid artery disease (HCC)    Followed by Dr Kellie Simmering   GERD (gastroesophageal reflux disease)    Hyperlipidemia    Hypertension    Hypothyroidism     Past Surgical History:  Procedure Laterality Date   APPENDECTOMY     IR IMAGING GUIDED PORT INSERTION  03/02/2021    There were no vitals filed for this visit.   Subjective Assessment - 06/28/21 1003     Subjective I adjusted the vest and I think it is doing ok. The neck part is filling with air but it does not seem to have much compression.    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 CT neck same day revealed an enhancing soft tissue lesion along the buccal margin of the left mandible; with lucency in the adjacent bone consistent with known SCC. Also found were necrotic left level IA/B and II/IIIB lymph nodes, noted to be concerning for nodal metastasis. During physical exam performed at this visit, Dr. Amada Jupiter  visualized the exophytic mass in and around the left mandibular canine, premolar, and first molar, along with an enlarged level 1A lymph node. The patient opted to proceed with multiple oral biopsies, mandibular resectioning, and lymphadenectomies, 01/18/21 Resection performed on 01/18/21 revealed the following: 3.3cm tumor, Gr 2 Squamous cell carcinoma of alveolar ridge extending to buccal gingival junction and lingual sulcus, DOI 44m, neg margins, 6/49 LN+ bilaterally with ECE; no PNI or LVSI; She also underwent Left fibula osteocutaneous free flap and ORIF mandible, will receive 30 fractions of radiation to her oral cavity and bilateral neck and 6 weeks of cisplatin chemotherapy. She started on 8/29 and will complete 04/19/21, [facility] and exchanged on 8/5 due to leakage. She uses PEG for most nutrition and medication. She is able to tolerate pureed foods but her reconstructive flap limits her swallowing, [facility], reports a 40 pack year smoking history, quitting off and on over the years, stopped smoking in May 2022.    Patient Stated Goals to gain info from providers    Currently in Pain? No/denies    Pain Score 0-No pain                   LYMPHEDEMA/ONCOLOGY QUESTIONNAIRE - 06/28/21 0001       Head and  Neck   4 cm superior to sternal notch around neck 39 cm    6 cm superior to sternal notch around neck 39.5 cm    8 cm superior to sternal notch around neck 42 cm                        [facility] Adult PT Treatment/Exercise - 06/28/21 0001       Manual Therapy   Myofascial Release gentle myofascial release to crevice just under chin to help improve lymphatic flow    Manual Lymphatic Drainage (MLD) in supine with head of bed elevated: short neck, 5 diaphragmatic breaths, bilateral axillary nodes, bilateral pectoral nodes, anterior chest, short neck, posterior, lateral and anterior neck moving fluid towards pathways, submental and  submandibular  areas and sides of face moving fluid towards pathways then retracing all steps - pt used handheld mirror to watch therapist throughout while therapist verbalized correct technique                          PT Long Term Goals - 06/09/21 1051       PT LONG TERM GOAL #1   Title Pt will be independent in self MLD for long term management of lymphedema.    Baseline 05/11/21- pt has not been able to try this due to increased fatigue and recent illness; 06/09/21 - have been instructing pt and she has been trying this at home but is not independent yet    Time 6    Period Weeks    Status On-going      PT LONG TERM GOAL #2   Title Pt will obtain appropriate compression garments for long term management of lymphedema.    Time 6    Period Weeks    Status On-going      PT LONG TERM GOAL #3   Title Pt will report she is able to descend steps with a step through gait pattern and a decreased fear of falling.    Baseline currently step to; 05/30/21- pt reports she is now able to do this    Time 6    Period Weeks    Status Achieved      PT LONG TERM GOAL #4   Title Pt will be independent in a home exercise program for continued stretching and strengthening.    Time 6    Period Weeks    Status On-going      PT LONG TERM GOAL #5   Title Pt will report her shoulder ROM has improved and she is no longer having trouble getting dressed or reaching overhead.    Baseline 05/11/21- pt still has difficulty getting things overhead    Time 6    Period Weeks    Status On-going      PT LONG TERM GOAL #6   Title Pt will be able to complete 13 sit to stands in 30 seconds without use of UEs to decrease fall risk.    Baseline 7; 06/09/21- not assessed today    Time 6    Period Weeks    Status On-going                   Plan - 06/28/21 1053     Clinical Impression Statement Pt disappointed this visit with the results from the [place]. Educated pt that she has  only had the pump for a week and it takes longer to  really see results. Pt does state she notices that the anterior neck is softer than it has been. Educated pt that this is a good sign and it is important to have a decrease in fibrosis as this is a first step to a reduction in the fluid. Continued with MLD today and pt held hand held mirror to watch therapist and learn technique especially at chin where her Flexi Touch does not cover. Pt also feels the pump does not have enough pressure over anterior neck so emailed rep to see if any adjustments can be made.    PT Frequency 2x / week    PT Duration 6 weeks    PT Treatment/Interventions ADLs/Self Care Home Management;Therapeutic exercise;Balance training;Neuromuscular re-education;Stair training;Gait training;Patient/family education;Orthotic Fit/Training;Manual techniques;Manual lymph drainage;Compression bandaging;Scar mobilization;Passive range of motion;Taping;Vasopneumatic Device;Joint Manipulations    PT Next Visit Plan cont MLD,  PROM to neck, once lymphedema is managed with garments and pump begin bilateral shoulder ROM and LE strengthening and balance - what did derek say about increasing pressure    PT Home Exercise Plan head and neck ROM exercises,Access Code [email or web address]    Consulted and Agree with Plan of Care Patient             Patient will benefit from skilled therapeutic intervention in order to improve the following deficits and impairments:  Postural dysfunction, Impaired UE functional use, Impaired flexibility, Increased fascial restricitons, Decreased strength, Decreased knowledge of use of DME, Decreased knowledge of precautions, Decreased range of motion, Decreased scar mobility, Increased edema, Difficulty walking, Decreased balance  Visit Diagnosis: Lymphedema, not elsewhere classified  Aftercare following surgery for neoplasm  Muscle weakness (generalized)  Difficulty in walking, not elsewhere classified  Stiffness of  left shoulder, not elsewhere classified  Disorder of the skin and subcutaneous tissue related to radiation, unspecified  Stiffness of right shoulder, not elsewhere classified  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HCC)     Problem List Patient Active Problem List   Diagnosis Date Noted   History of radiation to head and neck region 06/25/2021   Encounter for dental examination 06/25/2021   Xerostomia due to radiotherapy 06/25/2021   Dysgeusia 06/25/2021   Trismus 06/25/2021   Encounter for general adult medical examination with abnormal findings 06/06/2021   Encounter for osteoporosis screening in asymptomatic postmenopausal patient 06/06/2021   Visit for screening mammogram 06/06/2021   Need for immunization against influenza 06/06/2021   Symptomatic anemia 05/18/2021   CAP (community acquired pneumonia) 05/17/2021   Normocytic anemia 05/17/2021   Moderate protein-calorie malnutrition ([place]) 05/17/2021   Hyponatremia 05/17/2021   G tube feedings ([place]) 04/29/2021   Aortic atherosclerosis ([place]) 04/25/2021   Chest pain of uncertain etiology 47/42/5956   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 chemotherapy ([place]) 04/07/2021   Mucositis due to antineoplastic therapy 03/31/2021   Drug induced constipation 03/31/2021   Chemotherapy-induced nausea 03/31/2021   Port-A-Cath in place 03/09/2021   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   Carcinoma of lower gum ([place]) 02/20/2021   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[place]) 12/08/2020   Gastritis 12/08/2020   Anxiety 12/08/2020   Need for prophylactic vaccination and inoculation against influenza 07/30/2020   Depression 11/26/2015   GERD (gastroesophageal reflux disease) 11/26/2015   Migraine headache 11/26/2015   Screening for diabetes mellitus 09/03/2015   Hypothyroidism 07/22/2009   Mixed hyperlipidemia 07/22/2009   Hypertension, essential 07/22/2009   CAD, NATIVE VESSEL 07/22/2009   Obstruction of carotid artery  07/22/2009   Coronary artery spasm (  [place]) 07/22/2009    Manus Gunning, PT 06/28/2021, 10:57 AM  [facility] @ [address] [place], [place], [address] Phone: [phone]   Fax:  [phone]  Name: Tammy Brady MRN: [id number] Date of Birth: January 28, 1951   Manus Gunning, PT 06/28/21 10:57 AM  PHYSICAL THERAPY DISCHARGE SUMMARY  Visits from Start of Care: 16  Current functional level related to goals / functional outcomes: See above   Remaining deficits: See above   Education / Equipment: MLD   Patient agrees to discharge. Patient goals were not met. Patient is being discharged due to not returning since the last visit.  Allyson Sabal [place], [place] 12/13/21 2:13 PM

## 2021-06-29 ENCOUNTER — Telehealth: Payer: Self-pay | Admitting: Dietician

## 2021-06-29 NOTE — Telephone Encounter (Signed)
Second attempt to reach patient for nutrition follow-up. Message left with request for return call. Contact information provided.

## 2021-06-30 ENCOUNTER — Other Ambulatory Visit: Payer: Self-pay | Admitting: Physician Assistant

## 2021-06-30 ENCOUNTER — Encounter: Payer: Self-pay | Admitting: Physical Therapy

## 2021-06-30 DIAGNOSIS — E782 Mixed hyperlipidemia: Secondary | ICD-10-CM

## 2021-06-30 DIAGNOSIS — I10 Essential (primary) hypertension: Secondary | ICD-10-CM

## 2021-07-10 DIAGNOSIS — E782 Mixed hyperlipidemia: Secondary | ICD-10-CM

## 2021-07-10 DIAGNOSIS — I10 Essential (primary) hypertension: Secondary | ICD-10-CM

## 2021-07-10 DIAGNOSIS — E039 Hypothyroidism, unspecified: Secondary | ICD-10-CM | POA: Diagnosis not present

## 2021-07-13 ENCOUNTER — Other Ambulatory Visit: Payer: Self-pay

## 2021-07-13 ENCOUNTER — Ambulatory Visit: Payer: Medicare Other | Attending: Radiation Oncology

## 2021-07-13 DIAGNOSIS — R1311 Dysphagia, oral phase: Secondary | ICD-10-CM | POA: Diagnosis not present

## 2021-07-13 DIAGNOSIS — R471 Dysarthria and anarthria: Secondary | ICD-10-CM | POA: Diagnosis not present

## 2021-07-13 NOTE — Therapy (Signed)
[facility] [address], [place], [place], [address] Phone: [phone]   Fax:  [phone]  Speech Language Pathology Treatment  Patient Details  Name: Tammy Brady MRN: [id number] Date of Birth: 04-Jul-1951 Referring Provider (SLP): Eppie Gibson, MD   Encounter Date: 07/13/2021   End of Session - 07/13/21 1246     Visit Number 4    Number of Visits 5    Date for SLP Re-Evaluation 09/09/21    SLP Start Time 1026   9 minutes late   SLP Stop Time  1100    SLP Time Calculation (min) 34 min    Activity Tolerance Patient tolerated treatment well;Patient limited by pain             Past Medical History:  Diagnosis Date   Aortic atherosclerosis ([place])    CAD (coronary artery disease)    Mild non-obstructive disease by cath January 2011. Coronary CTA showed  mild (25-49%) plaque in the RCA and minimal (<25%) coronary CTA 04/2021 with coronary Ca score 257   Carotid artery disease (HCC)    Followed by Dr Kellie Simmering   GERD (gastroesophageal reflux disease)    Hyperlipidemia    Hypertension    Hypothyroidism     Past Surgical History:  Procedure Laterality Date   APPENDECTOMY     IR IMAGING GUIDED PORT INSERTION  03/02/2021    There were no vitals filed for this visit.   Subjective Assessment - 07/13/21 1028     Subjective "Drinking good - eating, fair."    Currently in Pain? No/denies                   ADULT SLP TREATMENT - 07/13/21 0001       General Information   Behavior/Cognition Cooperative;Alert;Pleasant mood      Treatment Provided   Treatment provided Dysphagia      Dysphagia Treatment   Temperature Spikes Noted No    Respiratory Status Room air    Oral Cavity - Dentition Missing dentition    Treatment Methods Skilled observation;Therapeutic exercise;Compensation strategy training;Patient/caregiver education    Type of PO's observed Dysphagia 1 (puree);Thin liquids    Oral Phase Signs & Symptoms  Prolonged bolus formation;Other (comment)   some slurping evident to encourage posterior bolus propulsion   Pharyngeal Phase Signs & Symptoms Other (comment)   none noted today   Other treatment/comments "I can eat soft foods that don't have a lot of texture....they get lost in my mouth....It's more trouble than it's worth." Pt is scheduled for consult for revision in mid-February at [facility]. Pt is better at achieving labial closure but has to "force it." She is still PEG dependent, and is against puree foods at this time, and because of this is skeptical she could have caloric/nutritional intake to sustain herself PO. SLP reminded pt about muscle atrophy and strongly encouraged pt to have some solids and liquids daily (currently just having liquids daily). Pt is not holding to recommended frequency of HEP however she completed each exercise today independently. "I just need to do them more," pt stated. SLP agreed.      Assessment / Recommendations / Plan   Plan --   8 weeks next appt - pt agreed     Dysphagia Recommendations   Diet recommendations --   diet as tolerated     Progression Toward Goals   Progression toward goals --   decr'd frequency HEP completion, however, completed independently  SLP Education - 07/13/21 1246     Education Details reiterated need for BID HEP, s/s aspiration PNA    Person(s) Educated Patient    Methods Explanation    Comprehension Verbalized understanding              SLP Short Term Goals - 07/13/21 1053       SLP SHORT TERM GOAL #1   Title pt will complete HEP with rare min A    Time 1    Period --   visit, for all STGs   Status Not Met   andcontinued     SLP SHORT TERM GOAL #2   Title pt will tell SLP why pt is completing HEP with modified independence    Time 1    Status Partially Met      SLP SHORT TERM GOAL #3   Title pt will describe 3 overt s/s aspiration PNA with modified independence    Status Achieved      SLP SHORT  TERM GOAL #4   Title pt will tell SLP how a food journal could hasten return to a more normalized diet    Status Achieved              SLP Long Term Goals - 07/13/21 1054       SLP LONG TERM GOAL #1   Title pt will complete HEP with modified independence over two visits    Baseline 07-13-21    Time 2    Period --   visits, for all LTGs   Status On-going   renewed 06-11-21     SLP LONG TERM GOAL #2   Title pt will describe how to modify HEP over time, and the timeline associated with reduction in HEP frequency with modified independence over two sessions    Time 3    Status On-going   renewed 06-11-21             Plan - 07/13/21 1247     Clinical Impression Statement At this time pt cont to exhibit at least cont'd oral stage dysphagia due mainly to surgical changes with mandible. Tammy Brady's swallowing is deemed WNL/WFL with water and puree, although she cont PEG dependent. SLP encouraged pt to have solids x3/day due to muscle atrophy. SLP reviewed pt's individualized HEP for dysphagia- pt was independent.  Data indicate that pt's swallow ability will likely decrease over the course of radiation therapy and could very well decline over time following conclusion of their radiation therapy due to muscle disuse atrophy and/or muscle fibrosis. Pt will cont to need to be seen by SLP in order to assess safety of PO intake, assess the need for recommending any objective swallow assessment, and ensuring pt correctly completes the individualized HEP.    Speech Therapy Frequency --   once approx every 8 weeks   Duration --   90 days   Treatment/Interventions Aspiration precaution training;Pharyngeal strengthening exercises;Diet toleration management by SLP;Trials of upgraded texture/liquids;Internal/external aids;Patient/family education;Compensatory strategies;SLP instruction and feedback    Potential to Achieve Goals Good    SLP Home Exercise Plan provided    Consulted and Agree with Plan of  Care Patient             Patient will benefit from skilled therapeutic intervention in order to improve the following deficits and impairments:   Dysphagia, oral phase  Dysarthria and anarthria    Problem List Patient Active Problem List   Diagnosis Date Noted   History of  radiation to head and neck region 06/25/2021   Encounter for dental examination 06/25/2021   Xerostomia due to radiotherapy 06/25/2021   Dysgeusia 06/25/2021   Trismus 06/25/2021   Encounter for general adult medical examination with abnormal findings 06/06/2021   Encounter for osteoporosis screening in asymptomatic postmenopausal patient 06/06/2021   Visit for screening mammogram 06/06/2021   Need for immunization against influenza 06/06/2021   Symptomatic anemia 05/18/2021   CAP (community acquired pneumonia) 05/17/2021   Normocytic anemia 05/17/2021   Moderate protein-calorie malnutrition ([place]) 05/17/2021   Hyponatremia 05/17/2021   G tube feedings ([place]) 04/29/2021   Aortic atherosclerosis ([place]) 04/25/2021   Chest pain of uncertain etiology 86/16/8372   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 chemotherapy ([place]) 04/07/2021   Mucositis due to antineoplastic therapy 03/31/2021   Drug induced constipation 03/31/2021   Chemotherapy-induced nausea 03/31/2021   Port-A-Cath in place 03/09/2021   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   Carcinoma of lower gum ([place]) 02/20/2021   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, [place], [place], [address] Phone: [phone]   Fax:  [phone]   Name: Tammy Brady MRN: [id number] Date of Birth: Sep 03, 1950

## 2021-07-14 ENCOUNTER — Encounter: Payer: Self-pay | Admitting: Physician Assistant

## 2021-07-14 DIAGNOSIS — C760 Malignant neoplasm of head, face and neck: Secondary | ICD-10-CM | POA: Diagnosis not present

## 2021-07-14 DIAGNOSIS — C44320 Squamous cell carcinoma of skin of unspecified parts of face: Secondary | ICD-10-CM | POA: Diagnosis not present

## 2021-07-14 DIAGNOSIS — C031 Malignant neoplasm of lower gum: Secondary | ICD-10-CM | POA: Diagnosis not present

## 2021-07-14 DIAGNOSIS — J439 Emphysema, unspecified: Secondary | ICD-10-CM | POA: Diagnosis not present

## 2021-07-14 DIAGNOSIS — R918 Other nonspecific abnormal finding of lung field: Secondary | ICD-10-CM | POA: Diagnosis not present

## 2021-07-14 DIAGNOSIS — Z720 Tobacco use: Secondary | ICD-10-CM | POA: Diagnosis not present

## 2021-07-14 DIAGNOSIS — I2510 Atherosclerotic heart disease of native coronary artery without angina pectoris: Secondary | ICD-10-CM | POA: Diagnosis not present

## 2021-07-15 ENCOUNTER — Encounter (HOSPITAL_COMMUNITY): Payer: Medicare Other | Admitting: Dietician

## 2021-07-15 ENCOUNTER — Ambulatory Visit
Admission: RE | Admit: 2021-07-15 | Discharge: 2021-07-15 | Disposition: A | Discharge status: Home / Self Care | Payer: Self-pay | Source: Ambulatory Visit | Attending: Radiation Oncology | Admitting: Radiation Oncology

## 2021-07-15 ENCOUNTER — Other Ambulatory Visit: Payer: Self-pay

## 2021-07-15 ENCOUNTER — Telehealth (HOSPITAL_COMMUNITY): Payer: Self-pay | Admitting: Dietician

## 2021-07-15 DIAGNOSIS — C060 Malignant neoplasm of cheek mucosa: Secondary | ICD-10-CM

## 2021-07-15 NOTE — Progress Notes (Signed)
outside

## 2021-07-15 NOTE — Telephone Encounter (Addendum)
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 She has completed concurrent chemoradiation.   Received call from patient stating that she has been unable to order Osmolite 1.5 from [facility] as this is currently on back order. Patient reports having enough formula for the next 8 days. Patient continues to work with speech therapy, reports eating more orally and is currently on puree diet per SLP. Patient reports she is unable to eat anything with texture because small pieces get stuck around the flap. She recalls eating mostly mashed potatoes, yogurt, pudding, smoothies. Patient is drinking water frequently through out the day as well as 1 (8oz can) of diet Sprite. Patient reports she "can not stand the thought of eating pureed meat" and continues giving 5 cartons Osmolite 1.5 via tube. Patient reports she has become some what dependent on them because "they are easy" She does not have to think about what to cook and there are no dishes to do afterwards. Patient reports MD at [facility] as well as SLP are "pushing" her to eat more orally and drink protein shakes. Patient drank nutra system protein shake this morning. She has some Ensure in the refrigerator that she is planning to start drinking again. Patient reports bleeding and pain at PEG insertion site in the last week. Patient seen by Dr. Amada Jupiter at [facility] for this yesterday. Patient reports this is better today s/p silvadene cream placed around site in office.    Medications: reviewed  Labs: no new labs for review  Anthropometrics: Patient reports weights are stable. Last weight 160 lb on 12/16  11/15 - 160 lb 11/9 - 162 lb 3.2 oz 10/28 - 161 lb 3.2 oz   NUTRITION DIAGNOSIS: Inadequate oral intake ongoing, pt relying on tube feedings  -5 cartons Osmolite 1.5/equivalent daily with 150 ml water flush QID provides  1775 kcal, 74.5 grams protein, 1505 ml total water  INTERVENTION:  Encouraged increasing intake of soft smooth foods  Continue drinking protein shakes, recommend 3 Ensure Plus/equivalent daily while working to wean from tube feedings Patient to pick up one complimentary case of Osmolite 1.5 from registration desk on 1/9 Will contact [facility] with request for estimated date of formula shipment/submit formula substitution Continue HEP exercises per SLP Patient has contact information     MONITORING, EVALUATION, GOAL: weight trends, intake, tube feeding   NEXT VISIT: ~3 weeks via telephone

## 2021-07-19 ENCOUNTER — Inpatient Hospital Stay: Payer: Medicare Other | Attending: Hematology and Oncology

## 2021-07-19 ENCOUNTER — Other Ambulatory Visit: Payer: Self-pay | Admitting: *Deleted

## 2021-07-19 ENCOUNTER — Inpatient Hospital Stay: Payer: Medicare Other | Admitting: Hematology and Oncology

## 2021-07-19 ENCOUNTER — Other Ambulatory Visit: Payer: Self-pay

## 2021-07-19 ENCOUNTER — Encounter: Payer: Self-pay | Admitting: Hematology and Oncology

## 2021-07-19 DIAGNOSIS — E039 Hypothyroidism, unspecified: Secondary | ICD-10-CM | POA: Diagnosis not present

## 2021-07-19 DIAGNOSIS — Z931 Gastrostomy status: Secondary | ICD-10-CM | POA: Diagnosis not present

## 2021-07-19 DIAGNOSIS — T451X5D Adverse effect of antineoplastic and immunosuppressive drugs, subsequent encounter: Secondary | ICD-10-CM | POA: Diagnosis not present

## 2021-07-19 DIAGNOSIS — K1231 Oral mucositis (ulcerative) due to antineoplastic therapy: Secondary | ICD-10-CM | POA: Diagnosis not present

## 2021-07-19 DIAGNOSIS — D649 Anemia, unspecified: Secondary | ICD-10-CM | POA: Insufficient documentation

## 2021-07-19 DIAGNOSIS — C060 Malignant neoplasm of cheek mucosa: Secondary | ICD-10-CM

## 2021-07-19 DIAGNOSIS — Z87891 Personal history of nicotine dependence: Secondary | ICD-10-CM | POA: Diagnosis not present

## 2021-07-19 DIAGNOSIS — Z95828 Presence of other vascular implants and grafts: Secondary | ICD-10-CM

## 2021-07-19 DIAGNOSIS — I890 Lymphedema, not elsewhere classified: Secondary | ICD-10-CM | POA: Diagnosis not present

## 2021-07-19 LAB — CMP (CANCER CENTER ONLY)
ALT: 12 U/L (ref 0–44)
AST: 19 U/L (ref 15–41)
Albumin: 3.8 g/dL (ref 3.5–5.0)
Alkaline Phosphatase: 71 U/L (ref 38–126)
Anion gap: 8 (ref 5–15)
BUN: 27 mg/dL — ABNORMAL HIGH (ref 8–23)
CO2: 26 mmol/L (ref 22–32)
Calcium: 9.8 mg/dL (ref 8.9–10.3)
Chloride: 104 mmol/L (ref 98–111)
Creatinine: 0.84 mg/dL (ref 0.44–1.00)
GFR, Estimated: >60 mL/min (ref >60)
Glucose, Bld: 98 mg/dL (ref 70–99)
Potassium: 4 mmol/L (ref 3.5–5.1)
Sodium: 138 mmol/L (ref 135–145)
Total Bilirubin: 0.3 mg/dL (ref 0.3–1.2)
Total Protein: 6.9 g/dL (ref 6.5–8.1)

## 2021-07-19 LAB — CBC WITH DIFFERENTIAL (CANCER CENTER ONLY)
Abs Immature Granulocytes: 0.01 10*3/uL (ref 0.00–0.07)
Basophils Absolute: 0 10*3/uL (ref 0.0–0.1)
Basophils Relative: 1 %
Eosinophils Absolute: 0.1 10*3/uL (ref 0.0–0.5)
Eosinophils Relative: 1 %
HCT: 36.2 % (ref 36.0–46.0)
Hemoglobin: 11.8 g/dL — ABNORMAL LOW (ref 12.0–15.0)
Immature Granulocytes: 0 %
Lymphocytes Relative: 10 %
Lymphs Abs: 0.6 10*3/uL — ABNORMAL LOW (ref 0.7–4.0)
MCH: 31.1 pg (ref 26.0–34.0)
MCHC: 32.6 g/dL (ref 30.0–36.0)
MCV: 95.5 fL (ref 80.0–100.0)
Monocytes Absolute: 0.5 10*3/uL (ref 0.1–1.0)
Monocytes Relative: 9 %
Neutro Abs: 4.5 10*3/uL (ref 1.7–7.7)
Neutrophils Relative %: 79 %
Platelet Count: 245 10*3/uL (ref 150–400)
RBC: 3.79 MIL/uL — ABNORMAL LOW (ref 3.87–5.11)
RDW: 13.6 % (ref 11.5–15.5)
WBC Count: 5.6 10*3/uL (ref 4.0–10.5)
nRBC: 0 % (ref 0.0–0.2)

## 2021-07-19 MED ORDER — HEPARIN SOD (PORK) LOCK FLUSH 100 UNIT/ML IV SOLN
500.0000 [IU] | Freq: Once | INTRAVENOUS | Status: AC
  Administered 2021-07-19: 500 [IU]

## 2021-07-19 MED ORDER — SODIUM CHLORIDE 0.9% FLUSH
10.0000 mL | Freq: Once | INTRAVENOUS | Status: AC
  Administered 2021-07-19: 10 mL

## 2021-07-19 NOTE — Assessment & Plan Note (Signed)
Patient has been tolerating G-tube feedings well.  She is also working on increasing her oral intake, working with nutrition to increase her caloric ingestion.  She is currently able to eat soft foods.  I have encouraged her to stay in touch with nutrition and our nurse navigation for any further assistance.

## 2021-07-19 NOTE — Progress Notes (Signed)
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  Her pathologic staging is pT3 N3B.   02/19/2021 Cancer Staging   Staging form: Oral Cavity, AJCC 8th Edition - Pathologic stage from 02/19/2021: Stage IVB (pT3, pN3b, cM0) - Signed by Eppie Gibson, MD on 02/20/2021 Stage prefix: Initial diagnosis    03/08/2021 - 04/19/2021 Radiation Therapy   Radiation therapy given concurrently with weekly Cisplatin   03/10/2021 - 04/15/2021 Chemotherapy   Patient is on Treatment Plan :  HEAD/NECK Cisplatin q7d  x 6 cycles     CURRENT THERAPY: s/p chemoradiation  INTERVAL HISTORY:  Tammy Brady 71 y.o. female returns for evaluation and follow-up of her head neck cancer diagnoses. She saw Dr. Amada Jupiter at [facility] ear nose and throat, and they noted no sign of cancer recurrence. Since her last visit, she has been doing well.  She denies any worsening fatigue.  Energy levels are slowly getting better.  She is able to sleep 7 hours at night.  Neck swelling has also significantly reduced.  She is working on massages to reduce lymphedema. She has her PCP managing her hypothyroidism and she is already on levothyroxine  supplements so she is hoping to continue thyroid monitoring by her PCP.  She had imaging recently which did not show any evidence of disease recurrence or residual disease.  Some posttreatment changes noted.  Headaches have significantly diminished since her last visit.  She is able to swallow soft foods such as broths, soups, yogurt and pudding.  She is working with nutrition to increase her oral intake so she can be less dependent on the G-tube.  She was encouraged to continue  Rest of the pertinent 10 point ROS reviewed and negative.  Patient Active Problem List   Diagnosis Date Noted   History of radiation to head and neck region 06/25/2021   Encounter for dental examination 06/25/2021   Xerostomia due to radiotherapy 06/25/2021   Dysgeusia 06/25/2021   Trismus 06/25/2021   Encounter for general adult medical examination with abnormal findings 06/06/2021   Encounter for osteoporosis screening in asymptomatic postmenopausal patient 06/06/2021   Visit for screening mammogram 06/06/2021   Need for immunization against influenza 06/06/2021   Symptomatic anemia 05/18/2021   CAP (community acquired pneumonia) 05/17/2021   Normocytic anemia 05/17/2021   Moderate protein-calorie malnutrition ([place]) 05/17/2021   Hyponatremia 05/17/2021   G tube feedings ([place]) 04/29/2021   Aortic atherosclerosis ([place]) 04/25/2021   Chest pain of uncertain etiology 21/19/4174   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 chemotherapy ([place]) 04/07/2021   Mucositis due to antineoplastic therapy 03/31/2021   Drug induced constipation 03/31/2021   Chemotherapy-induced nausea 03/31/2021   Port-A-Cath in place 03/09/2021   Squamous cell carcinoma  in situ of oral cavity 02/22/2021   Carcinoma of lower gum ([place]) 02/20/2021   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  MEDICAL HISTORY: Past Medical History:  Diagnosis Date   Aortic atherosclerosis ([place])    CAD (coronary artery disease)    Mild non-obstructive disease by cath January 2011. Coronary CTA showed  mild (25-49%) plaque in the RCA and minimal (<25%) coronary CTA 04/2021 with coronary Ca score 257   Carotid artery disease (HCC)    Followed by Dr Kellie Simmering   GERD (gastroesophageal reflux disease)    Hyperlipidemia    Hypertension    Hypothyroidism     SURGICAL HISTORY: Past Surgical History:  Procedure Laterality Date   APPENDECTOMY     IR IMAGING GUIDED PORT INSERTION  03/02/2021    SOCIAL HISTORY: Social History   Socioeconomic History   Marital status: Married    Spouse name: Not on file   Number of children: 1   Years of education: Not on file   Highest education level: Not on file  Occupational History   Occupation: Field seismologist: [employer]  Tobacco Use   Smoking status: Former    Packs/day: 1.00    Years: 40.00    Pack years: 40.00    Types: Cigarettes    Quit date: 05/11/2009    Years since quitting: 12.1   Smokeless tobacco: Never  Substance and Sexual Activity   Alcohol use: Not Currently   Drug use: No   Sexual activity: Not Currently  Other Topics Concern   Not on file  Social History Narrative   Not on file   Social Determinants of Health   Financial Resource Strain: Low Risk    Difficulty of Paying Living Expenses: Not hard at all  Food Insecurity: No Food Insecurity   Worried About Charity fundraiser in the Last Year: Never true   [place] in the  Last Year: Never true  Transportation Needs: No Transportation Needs   Lack of Transportation (Medical): No   Lack of Transportation (Non-Medical): No  Physical Activity: Inactive   Days of Exercise per Week: 0 days   Minutes of Exercise per Session: 0 min  Stress: No Stress Concern Present   Feeling of Stress : Only a little  Social Connections: Engineer, building services of Communication with Friends and Family: More than three times a week   Frequency of Social Gatherings with Friends and Family: More than three times a week   Attends Religious Services: More than 4 times per year   Active Member of [employer] or Organizations: Yes   Attends Music therapist: More than 4 times per year   Marital Status: Married  Human resources officer Violence: Not At Risk   Fear of Current or Ex-Partner: No   Emotionally Abused: No   Physically Abused: No   Sexually Abused: No    FAMILY HISTORY: Family History  Problem Relation Age of Onset  Cancer Mother        esophageal   Heart disease Father    Coronary artery disease Father    Hyperlipidemia Sister    Hyperlipidemia Sister    Hyperlipidemia Brother    Hyperlipidemia Brother    Hyperlipidemia Brother    Coronary artery disease Maternal Grandmother     Review of Systems  Constitutional:  Negative for appetite change, chills, fatigue, fever and unexpected weight change.  HENT:   Negative for hearing loss, lump/mass and trouble swallowing.   Eyes:  Negative for eye problems and icterus.  Respiratory:  Negative for chest tightness, cough and shortness of breath.   Cardiovascular:  Negative for chest pain, leg swelling and palpitations.  Gastrointestinal:  Negative for abdominal distention, abdominal pain, constipation, diarrhea, nausea and vomiting.  Endocrine: Negative for hot flashes.  Genitourinary:  Negative for difficulty urinating.   Musculoskeletal:  Negative for arthralgias.  Skin:  Negative for itching and rash.   Neurological:  Negative for dizziness, extremity weakness, headaches and numbness.  Hematological:  Negative for adenopathy. Does not bruise/bleed easily.  Psychiatric/Behavioral:  Negative for depression. The patient is not nervous/anxious.      PHYSICAL EXAMINATION  ECOG PERFORMANCE STATUS: 1 - Symptomatic but completely ambulatory  Vitals:   07/19/21 0904  BP: 130/63  Pulse: 67  Resp: 17  Temp: (!) 96.8 F (36 C)  SpO2: 98%    Physical Exam Constitutional:      General: She is not in acute distress.    Appearance: Normal appearance. She is not toxic-appearing.  HENT:     Head: Normocephalic and atraumatic.     Mouth/Throat:     Comments: Graft appears healthy.  No mucositis noted.  Oral mucosa moist Eyes:     General: No scleral icterus. Cardiovascular:     Rate and Rhythm: Normal rate and regular rhythm.     Pulses: Normal pulses.     Heart sounds: Normal heart sounds.  Pulmonary:     Effort: Pulmonary effort is normal.     Breath sounds: Normal breath sounds.  Abdominal:     General: Abdomen is flat. Bowel sounds are normal. There is no distension.     Palpations: Abdomen is soft.     Tenderness: There is no abdominal tenderness.     Comments: G-tube site appears well.  There is some drainage around the G-tube but no concern for infection.  Musculoskeletal:        General: No swelling.     Cervical back: Neck supple.  Lymphadenopathy:     Cervical: No cervical adenopathy.  Skin:    General: Skin is warm and dry.     Findings: No rash.  Neurological:     General: No focal deficit present.     Mental Status: She is alert.  Psychiatric:        Mood and Affect: Mood normal.        Behavior: Behavior normal.    LABORATORY DATA:  CBC    Component Value Date/Time   WBC 5.6 07/19/2021 0848   WBC 4.7 05/18/2021 0516   RBC 3.79 (L) 07/19/2021 0848   HGB 11.8 (L) 07/19/2021 0848   HGB 10.0 (L) 04/20/2021 1356   HCT 36.2 07/19/2021 0848   HCT 30.9 (L)  04/20/2021 1356   PLT 245 07/19/2021 0848   PLT 218 04/20/2021 1356   MCV 95.5 07/19/2021 0848   MCV 87 04/20/2021 1356   MCH 31.1 07/19/2021 0848   MCHC 32.6  07/19/2021 0848   RDW 13.6 07/19/2021 0848   RDW 16.0 (H) 04/20/2021 1356   LYMPHSABS 0.6 (L) 07/19/2021 0848   LYMPHSABS 0.2 (L) 04/20/2021 1356   MONOABS 0.5 07/19/2021 0848   EOSABS 0.1 07/19/2021 0848   EOSABS 0.0 04/20/2021 1356   BASOSABS 0.0 07/19/2021 0848   BASOSABS 0.0 04/20/2021 1356    CMP     Component Value Date/Time   NA 138 05/31/2021 0851   NA 134 04/20/2021 1356   K 4.2 05/31/2021 0851   CL 102 05/31/2021 0851   CO2 27 05/31/2021 0851   GLUCOSE 137 (H) 05/31/2021 0851   BUN 19 05/31/2021 0851   BUN 21 04/20/2021 1356   CREATININE 0.73 05/31/2021 0851   CALCIUM 9.4 05/31/2021 0851   PROT 6.9 05/17/2021 0520   PROT 6.4 04/20/2021 1356   ALBUMIN 2.7 (L) 05/17/2021 0520   ALBUMIN 3.4 (L) 04/20/2021 1356   AST 26 05/17/2021 0520   AST 19 05/07/2021 1057   ALT 14 05/17/2021 0520   ALT 17 05/07/2021 1057   ALKPHOS 71 05/17/2021 0520   BILITOT 0.4 05/17/2021 0520   BILITOT 0.3 05/07/2021 1057   GFRNONAA >60 05/31/2021 0851   GFRAA 83 08/26/2020 0927    I have reviewed her imaging results from care everywhere.  ASSESSMENT and PLAN:   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HCC) This is a very pleasant 71 year old female patient with new diagnosis of squamous cell carcinoma of the oral cavity status post surgery on January 18, 2021 with pathology showing a 3.3 cm tumor, grade 2 squamous cell carcinoma of the alveolar ridge, negative margins, 6 out of 49 lymph nodes positive bilaterally with extracapsular extension referred to medical oncology for consideration of adjuvant chemotherapy.  Her pathologic staging is pT3 N3B.  Given positive margins and presence of extracapsular extension,she completed 6 weeks of chemoradiation. She tolerated well overall, she is here for follow-up.  Since last visit she  had an ENT visit, no concern for recurrence.  She had imaging which did not show any evidence of residual disease.  She is has been overall recovering as expected.  She will return to clinic with medical oncology in 1 year.  Her TSH monitoring will be done by her PCP who has already been monitoring her hypothyroidism.    G tube feedings [facility]) Patient has been tolerating G-tube feedings well.  She is also working on increasing her oral intake, working with nutrition to increase her caloric ingestion.  She is currently able to eat soft foods.  I have encouraged her to stay in touch with nutrition and our nurse navigation for any further assistance.   Total encounter time: 30 minutes in face-to-face visit time, chart review, lab review, care coordination, and documentation of the encounter.  We have discussed about surveillance recommendations, advancing G-tube feedings, expected recovery time and follow-up. *Total Encounter Time as defined by the Centers for Medicare and Medicaid Services includes, in addition to the face-to-face time of a patient visit (documented in the note above) non-face-to-face time: obtaining and reviewing outside history, ordering and reviewing medications, tests or procedures, care coordination (communications with other health care professionals or caregivers) and documentation in the medical record.

## 2021-07-19 NOTE — Assessment & Plan Note (Addendum)
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  Her pathologic staging is pT3 N3B.  Given positive margins and presence of extracapsular extension,she completed 6 weeks of chemoradiation. She tolerated well overall, she is here for follow-up.  Since last visit she had an ENT visit, no concern for recurrence.  She had imaging which did not show any evidence of residual disease.  She is has been overall recovering as expected.  She will return to clinic with medical oncology in 1 year.  Her TSH monitoring will be done by her PCP who has already been monitoring her hypothyroidism.

## 2021-07-20 ENCOUNTER — Ambulatory Visit
Admission: RE | Admit: 2021-07-20 | Discharge: 2021-07-20 | Disposition: A | Discharge status: Home / Self Care | Payer: Medicare Other | Source: Ambulatory Visit | Attending: Radiation Oncology | Admitting: Radiation Oncology

## 2021-07-20 ENCOUNTER — Telehealth: Payer: Self-pay | Admitting: Hematology and Oncology

## 2021-07-20 VITALS — BP 137/63 | HR 71 | Temp 96.3°F | Resp 18 | Ht 66.0 in | Wt 161.2 lb

## 2021-07-20 DIAGNOSIS — C411 Malignant neoplasm of mandible: Secondary | ICD-10-CM | POA: Diagnosis not present

## 2021-07-20 DIAGNOSIS — C031 Malignant neoplasm of lower gum: Secondary | ICD-10-CM

## 2021-07-20 DIAGNOSIS — Z08 Encounter for follow-up examination after completed treatment for malignant neoplasm: Secondary | ICD-10-CM | POA: Diagnosis not present

## 2021-07-20 NOTE — Progress Notes (Signed)
Mr. Neale presents today for follow-up after completing radiation to her mandible on 04/19/2021  Pain issues, if any: Denies, but does report discomfort when trying to open her jaw fully. Also reports soreness under her chin/jaw from lymphedema  Using a feeding tube?: Yes--able to eat pureed/softer foods, but continues to use feeding tube to help supplement diet since swallowing is difficult due to bulkiness of oral flap. She is hoping to be able to keep her feeding tube in place until all her reconstruction surgery is completed and she's able to resume a more normal diet Weight changes, if any:  Wt Readings from Last 3 Encounters:  07/20/21 161 lb 4 oz (73.1 kg)  07/19/21 162 lb 3.2 oz (73.6 kg)  06/25/21 160 lb (72.6 kg)   Swallowing issues, if any: Only because of bulkiness from oral flap. Saw Glendell Docker Schinke-SLP on 07/13/2021: "At this time pt cont to exhibit at least cont'd oral stage dysphagia due mainly to surgical changes with mandible. Elenna's swallowing is deemed WNL/WFL with water and puree, although she cont PEG dependent. SLP encouraged pt to have solids x3/day due to muscle atrophy. SLP reviewed pt's individualized HEP for dysphagia- pt was independent. " Smoking or chewing tobacco? None Using fluoride trays daily? N/A Last ENT visit was on: 05/28/2021 Saw Dr. Vira Browns:  - Continues to heal well. The patients oral flap continues to have significant volume, although decreasing. We will wait until the patient is fully healed prior to considering a revision flap surgery.  - Follow up with me in 3 months for repeat evaluation of the flap.  - They have my contact information and know to contact me with any questions or concerns. Also saw Dr. Ileene Rubens [facility]) on 07/12/2021   Other notable issues, if any: Had CT scans w/ contrast of chest and neck at [facility] on 07/14/2021. Had F/U with medical oncologist Dr. Chryl Heck yesterday 07/19/2021. Continues to experience  constant tinnitus and tingling feeling to her jaw (describes it as a stretching/bugs crawling sensation). Trialing a tactile compression device to help with neck lymphedema. Otherwise reports she feels well and is in good spirits

## 2021-07-20 NOTE — Telephone Encounter (Signed)
Scheduled appointment per 1/9 los. Left message.

## 2021-07-21 ENCOUNTER — Other Ambulatory Visit: Payer: Self-pay

## 2021-07-21 DIAGNOSIS — H524 Presbyopia: Secondary | ICD-10-CM | POA: Diagnosis not present

## 2021-07-21 DIAGNOSIS — H25813 Combined forms of age-related cataract, bilateral: Secondary | ICD-10-CM | POA: Diagnosis not present

## 2021-07-21 DIAGNOSIS — C060 Malignant neoplasm of cheek mucosa: Secondary | ICD-10-CM

## 2021-07-21 NOTE — Progress Notes (Signed)
Oncology Nurse Navigator Documentation   I met with Tammy Brady and her husband yesterday during her follow up appointment with Dr. Isidore Moos to discuss her recent scan results. After her appointment I contacted Dr. Benson Norway at Dr. Pearlie Oyster request for dental evaluation. Dr. Chryl Heck placed an order to have her PAC removed and she was scheduled to see Dr. Isidore Moos in six months on 01/25/22 at 2:00. I called and left a voice mail with Tammy Brady informing her of the above information. I left my direct contact information if she had any questions.   Harlow Asa RN, BSN, OCN Head & Neck Oncology Nurse [place] at [facility] Phone # [phone]  Fax # [phone]

## 2021-07-22 ENCOUNTER — Encounter: Payer: Self-pay | Admitting: Radiation Oncology

## 2021-07-22 NOTE — Progress Notes (Signed)
Radiation Oncology         [phone] ________________________________  Name: Tammy Brady MRN: [id number]  Date: 07/20/2021  DOB: [birth year]  Follow-Up Visit Note  Outpatient  CC: Delia Chimes, PA-C  Diagnosis and Prior Radiotherapy:    ICD-10-CM   1. Carcinoma of lower gum ([place])  C03.1       CHIEF COMPLAINT: Here for follow-up and surveillance of oral cancer  Narrative:   Ms.  Sheffer presents today for follow-up after completing radiation to her mandible on 04/19/2021  Pain issues, if any: Denies, but does report discomfort when trying to open her jaw fully. Also reports soreness under her chin/jaw from lymphedema  Using a feeding tube?: Yes--able to eat pureed/softer foods, but continues to use feeding tube to help supplement diet since swallowing is difficult due to bulkiness of oral flap. She is hoping to be able to keep her feeding tube in place until all her reconstruction surgery is completed and she's able to resume a more normal diet Weight changes, if any:  Wt Readings from Last 3 Encounters:  07/20/21 161 lb 4 oz (73.1 kg)  07/19/21 162 lb 3.2 oz (73.6 kg)  06/25/21 160 lb (72.6 kg)   Swallowing issues, if any: Only because of bulkiness from oral flap. Saw Glendell Docker Schinke-SLP on 07/13/2021: "At this time pt cont to exhibit at least cont'd oral stage dysphagia due mainly to surgical changes with mandible. [name]'s swallowing is deemed WNL/WFL with water and puree, although she cont PEG dependent. SLP encouraged pt to have solids x3/day due to muscle atrophy. SLP reviewed pt's individualized HEP for dysphagia- pt was independent. " Smoking or chewing tobacco? None Using fluoride trays daily? N/A Last ENT visit was on: 05/28/2021 Saw Dr. Vira Browns:  - Continues to heal well. The patients oral flap continues to have significant volume, although decreasing. We will wait until the patient is fully healed prior to considering a revision flap surgery.  -  Follow up with me in 3 months for repeat evaluation of the flap.  - They have my contact information and know to contact me with any questions or concerns. Also saw Dr. Ileene Rubens [facility]) on 07/12/2021   Other notable issues, if any: Had CT scans w/ contrast of chest and neck at [facility] on 07/14/2021 - in remission. Had F/U with medical oncologist Dr. Chryl Heck yesterday 07/19/2021. Continues to experience constant tinnitus and tingling feeling to her jaw (describes it as a stretching/bugs crawling sensation). Trialing a tactile compression device to help with neck lymphedema. Otherwise reports she feels well and is in good spirits                ALLERGIES:  is allergic to codeine and oxycodone.  Meds: Current Outpatient Medications  Medication Sig Dispense Refill   acetaminophen (TYLENOL) 500 MG tablet Take 500 mg by mouth every 6 (six) hours as needed for mild pain.     aspirin EC 81 MG tablet Take 1 tablet (81 mg total) by mouth daily. Swallow whole. 90 tablet 3   calcium carbonate (OS-CAL) 600 MG TABS Take 600 mg by mouth daily.     diltiazem (CARDIZEM CD) 360 MG 24 hr capsule TAKE 1 CAPSULE BY MOUTH  DAILY 90 capsule 1   gabapentin (NEURONTIN) 300 MG capsule Take 300 mg by mouth 3 (three) times daily.     levothyroxine (SYNTHROID) 112 MCG tablet TAKE 1 TABLET BY MOUTH  DAILY 90 tablet 1  nitroGLYCERIN (NITROSTAT) 0.4 MG SL tablet Place 1 tablet (0.4 mg total) under the tongue every 5 (five) minutes as needed. (Patient taking differently: Place 0.4 mg under the tongue every 5 (five) minutes as needed for chest pain.) 25 tablet 6   Nutritional Supplements (FEEDING SUPPLEMENT, OSMOLITE 1.5 CAL,) LIQD Place 360 mLs into feeding tube in the morning, at noon, in the evening, and at bedtime.     Polyethylene Glycol 3350 (MIRALAX PO) Take 17 g by mouth daily as needed (constipation).     simvastatin (ZOCOR) 40 MG tablet TAKE 1 TABLET BY MOUTH AT  BEDTIME 90 tablet 1   sodium  fluoride (SODIUM FLUORIDE 5000 PPM) 1.1 % GEL dental gel Place 1 pea-size drop into each tooth space of fluoride trays once a day at bedtime.  Leave trays in for 5 minutes and then remove.  Spit out excess fluoride, but DO NOT rinse with water, eat or drink for at least 30 minutes after use. 120 mL 11   traZODone (DESYREL) 50 MG tablet Take 1 tablet (50 mg total) by mouth at bedtime. 90 tablet 1   No current facility-administered medications for this encounter.    Physical Findings: The patient is in no acute distress. Patient is alert and oriented.  height is 5\' 6"  (1.676 m) and weight is 161 lb 4 oz (73.1 kg). Her temporal temperature is 96.3 F (35.7 C) (abnormal). Her blood pressure is 137/63 and her pulse is 71. Her respiration is 18 and oxygen saturation is 97%. .    General: Alert and oriented, in no acute distress HEENT: Head is normocephalic. Extraocular movements are intact. Mouth /Oropharynx- no visible tumor or thrush.   Heart: RRR Chest: CTAB Neck: no masses palpated; + anterior/submental lymphedema. Psychiatric: Judgment and insight are intact. Affect is appropriate.   Lab Findings: Lab Results  Component Value Date   WBC 5.6 07/19/2021   HGB 11.8 (L) 07/19/2021   HCT 36.2 07/19/2021   MCV 95.5 07/19/2021   PLT 245 07/19/2021    Radiographic Findings: No results found.  Impression/Plan:     1) Head and Neck Cancer Status: In remission per physical exam and CT imaging this month at [facility]  2) Nutritional Status: still using PEG; we discussed methods to wean off this. I wrote a list of high protein, high calorie soft/liquid/pureed foods to try PEG tube: in place  3) Risk Factors: The patient has been educated about risk factors including alcohol and tobacco abuse; they understand that avoidance of alcohol and tobacco is important to prevent recurrences as well as other cancers  4) Swallowing: functional with soft/puree foods  5) Dental: Encouraged to continue  regular followup with dentistry, and dental hygiene including fluoride rinses. We will refer to Dr. Benson Norway as she has struggled to find a community dentist to care for her dental needs  6) Thyroid function:  check annually in med onc - WNL in Oct Lab Results  Component Value Date   TSH 1.080 04/20/2021   7) lymphedema: continue using tactile vest  8) Dr. Chryl Heck placed an order to have her PAC removed. Continue f/u w/ ENT and med onc. She will see me in six months on 01/25/22 at 2:00.   On date of service, in total, I spent 25 minutes on this encounter. Patient was seen in person.  _____________________________________   Eppie Gibson, MD

## 2021-07-26 ENCOUNTER — Encounter: Payer: Self-pay | Admitting: Physician Assistant

## 2021-07-26 ENCOUNTER — Other Ambulatory Visit: Payer: Self-pay

## 2021-07-26 ENCOUNTER — Ambulatory Visit (INDEPENDENT_AMBULATORY_CARE_PROVIDER_SITE_OTHER): Payer: Medicare Other | Admitting: Physician Assistant

## 2021-07-26 DIAGNOSIS — E782 Mixed hyperlipidemia: Secondary | ICD-10-CM

## 2021-07-26 DIAGNOSIS — F419 Anxiety disorder, unspecified: Secondary | ICD-10-CM | POA: Diagnosis not present

## 2021-07-26 DIAGNOSIS — I10 Essential (primary) hypertension: Secondary | ICD-10-CM

## 2021-07-26 NOTE — Progress Notes (Signed)
Subjective:  Patient ID: Tammy Brady, female    DOB: Nov 19, 1950  Age: 71 y.o. MRN: [id number]  Chief Complaint  Patient presents with   Hypertension   Hypothyroidism    Hypertension  Pt presents for follow up of hypertension.The patient is tolerating the medication well without side effects. Compliance with treatment has been good; including taking medication as directed , maintains a healthy diet and regular exercise regimen , and following up as directed.pt taking cardizem CD 360mg  qd -  Mixed hyperlipidemia  Pt presents with hyperlipidemia.  Compliance with treatment has been good; The patient is compliant with medications, maintains a low cholesterol diet , follows up as directed , and maintains an exercise regimen . The patient denies experiencing any hypercholesterolemia related symptoms. Currently taking zocor 40mg  qd - due for labwork  Pt with history of hypothyroidism - on synthroid 123mcg qd - due for labwork  Pt with newly diagnosed squamous cell carcinoma of oral mucosa - she is being followed by Dr Amada Jupiter with [place] [facility] --- and Dr Ihor Austin -- She also follows with Dr Lanell Persons (radiation oncologist) and Dr Chryl Heck - medical oncologist Pt had extensive oral surgery on 01/18/21 - she had her lower mandible, most of her teeth, soft tissue and lymph nodes removed - Pt did have a fibula flap done for reconstruction of oral cavity She states that her cancer is now in remission - will see ENT in April, oncology in one year and radiation oncologist in 6 months She has appt on 2/17 with Dr Frazier Butt to discuss revision of her flap At this time she is able to eat soft foods, drinking ensure and still using her feeding tube  Of note pt did have recent CT of neck and chest 1/23 - at this time results have not been reported  Current Outpatient Medications on File Prior to Visit  Medication Sig Dispense Refill   acetaminophen (TYLENOL) 500 MG tablet Take 500 mg by  mouth every 6 (six) hours as needed for mild pain.     aspirin EC 81 MG tablet Take 1 tablet (81 mg total) by mouth daily. Swallow whole. 90 tablet 3   calcium carbonate (OS-CAL) 600 MG TABS Take 600 mg by mouth daily.     diltiazem (CARDIZEM CD) 360 MG 24 hr capsule TAKE 1 CAPSULE BY MOUTH  DAILY 90 capsule 1   gabapentin (NEURONTIN) 300 MG capsule Take 300 mg by mouth 3 (three) times daily.     levothyroxine (SYNTHROID) 112 MCG tablet TAKE 1 TABLET BY MOUTH  DAILY 90 tablet 1   nitroGLYCERIN (NITROSTAT) 0.4 MG SL tablet Place 1 tablet (0.4 mg total) under the tongue every 5 (five) minutes as needed. (Patient taking differently: Place 0.4 mg under the tongue every 5 (five) minutes as needed for chest pain.) 25 tablet 6   Nutritional Supplements (FEEDING SUPPLEMENT, OSMOLITE 1.5 CAL,) LIQD Place 360 mLs into feeding tube in the morning, at noon, in the evening, and at bedtime.     Polyethylene Glycol 3350 (MIRALAX PO) Take 17 g by mouth daily as needed (constipation).     simvastatin (ZOCOR) 40 MG tablet TAKE 1 TABLET BY MOUTH AT  BEDTIME 90 tablet 1   sodium fluoride (SODIUM FLUORIDE 5000 PPM) 1.1 % GEL dental gel Place 1 pea-size drop into each tooth space of fluoride trays once a day at bedtime.  Leave trays in for 5 minutes and then remove.  Spit out excess fluoride, but DO  NOT rinse with water, eat or drink for at least 30 minutes after use. 120 mL 11   traZODone (DESYREL) 50 MG tablet Take 1 tablet (50 mg total) by mouth at bedtime. 90 tablet 1   No current facility-administered medications on file prior to visit.   Past Medical History:  Diagnosis Date   Aortic atherosclerosis ([place])    CAD (coronary artery disease)    Mild non-obstructive disease by cath January 2011. Coronary CTA showed  mild (25-49%) plaque in the RCA and minimal (<25%) coronary CTA 04/2021 with coronary Ca score 257   Carotid artery disease (H[facility])    Followed by Dr Kellie Simmering   GERD (gastroesophageal reflux disease)     Hyperlipidemia    Hypertension    Hypothyroidism    Past Surgical History:  Procedure Laterality Date   APPENDECTOMY     IR IMAGING GUIDED PORT INSERTION  03/02/2021    Family History  Problem Relation Age of Onset   Cancer Mother        esophageal   Heart disease Father    Coronary artery disease Father    Hyperlipidemia Sister    Hyperlipidemia Sister    Hyperlipidemia Brother    Hyperlipidemia Brother    Hyperlipidemia Brother    Coronary artery disease Maternal Grandmother    Social History   Socioeconomic History   Marital status: Married    Spouse name: Not on file   Number of children: 1   Years of education: Not on file   Highest education level: Not on file  Occupational History   Occupation: Field seismologist: [employer]  Tobacco Use   Smoking status: Former    Packs/day: 1.00    Years: 40.00    Pack years: 40.00    Types: Cigarettes    Quit date: 05/11/2009    Years since quitting: 12.2   Smokeless tobacco: Never  Substance and Sexual Activity   Alcohol use: Not Currently   Drug use: No   Sexual activity: Not Currently  Other Topics Concern   Not on file  Social History Narrative   Not on file   Social Determinants of Health   Financial Resource Strain: Low Risk    Difficulty of Paying Living Expenses: Not hard at all  Food Insecurity: No Food Insecurity   Worried About Charity fundraiser in the Last Year: Never true   [place] in the Last Year: Never true  Transportation Needs: No Transportation Needs   Lack of Transportation (Medical): No   Lack of Transportation (Non-Medical): No  Physical Activity: Inactive   Days of Exercise per Week: 0 days   Minutes of Exercise per Session: 0 min  Stress: No Stress Concern Present   Feeling of Stress : Only a little  Social Connections: Engineer, building services of Communication with Friends and Family: More than three times a week   Frequency of Social Gatherings with Friends  and Family: More than three times a week   Attends Religious Services: More than 4 times per year   Active Member of [employer] or Organizations: Yes   Attends Music therapist: More than 4 times per year   Marital Status: Married   PHYSICAL EXAM:   VS: BP 118/68    Pulse 68    Temp (!) 97.3 F (36.3 C)    Ht 5\' 7"  (1.702 m)    Wt 157 lb (71.2 kg)    SpO2 98%  BMI 24.59 kg/m   GEN: Well nourished, well developed, in no acute distress - pt looks well HEENT: flap noted lower jaw Cardiac: RRR; no murmurs, rubs, or gallops,no edema -  Respiratory:  normal respiratory rate and pattern with no distress - normal breath sounds with no rales, rhonchi, wheezes or rubs Skin: warm and dry, no rash  Psych: euthymic mood, appropriate affect and demeanor- pt with upbeat mood   Objective:    Diabetic Foot Exam - Simple   No data filed      Lab Results  Component Value Date   WBC 5.6 07/19/2021   HGB 11.8 (L) 07/19/2021   HCT 36.2 07/19/2021   PLT 245 07/19/2021   GLUCOSE 98 07/19/2021   CHOL 144 04/20/2021   TRIG 119 04/20/2021   HDL 55 04/20/2021   LDLCALC 68 04/20/2021   ALT 12 07/19/2021   AST 19 07/19/2021   NA 138 07/19/2021   K 4.0 07/19/2021   CL 104 07/19/2021   CREATININE 0.84 07/19/2021   BUN 27 (H) 07/19/2021   CO2 26 07/19/2021   TSH 1.080 04/20/2021   INR 0.99 07/16/2009   HGBA1C  07/15/2009    5.1 (NOTE) The ADA recommends the following therapeutic goal for glycemic control related to Hgb A1c measurement: Goal of therapy: <6.5 Hgb A1c  Reference: American Diabetes Association: Clinical Practice Recommendations 2010, Diabetes Care, 2010, 33: (Suppl  1).      Assessment & Plan:   1. Acquired hypothyroidism - TSH Continue meds as directed 2. Mixed hyperlipidemia Continue meds and watch diet Labwork pending 3. Hypertension, essential Continue meds Labwork pending 4. Anxiety Continue meds 5. Squamous cell carcinoma of oral mucosa  ([place]) Continue current follow up with specialists  No orders of the defined types were placed in this encounter.    Orders Placed This Encounter  Procedures   CBC with Differential/Platelet   Comprehensive metabolic panel   TSH   Lipid panel       Follow-up: Return in about 4 months (around 11/23/2021) for chronic fasting follow up.  An After Visit Summary was printed and given to the patient.  Yetta Flock [facility] [phone]

## 2021-07-27 ENCOUNTER — Other Ambulatory Visit: Payer: Self-pay | Admitting: Physician Assistant

## 2021-07-27 DIAGNOSIS — R899 Unspecified abnormal finding in specimens from other organs, systems and tissues: Secondary | ICD-10-CM

## 2021-07-27 LAB — COMPREHENSIVE METABOLIC PANEL
ALT: 14 IU/L (ref 0–32)
AST: 20 IU/L (ref 0–40)
Albumin/Globulin Ratio: 1.8 (ref 1.2–2.2)
Albumin: 4.4 g/dL (ref 3.8–4.8)
Alkaline Phosphatase: 86 IU/L (ref 44–121)
BUN/Creatinine Ratio: 22 (ref 12–28)
BUN: 20 mg/dL (ref 8–27)
Bilirubin Total: 0.3 mg/dL (ref 0.0–1.2)
CO2: 25 mmol/L (ref 20–29)
Calcium: 10.8 mg/dL — ABNORMAL HIGH (ref 8.7–10.3)
Chloride: 103 mmol/L (ref 96–106)
Creatinine, Ser: 0.9 mg/dL (ref 0.57–1.00)
Globulin, Total: 2.5 g/dL (ref 1.5–4.5)
Glucose: 90 mg/dL (ref 70–99)
Potassium: 5.5 mmol/L — ABNORMAL HIGH (ref 3.5–5.2)
Sodium: 141 mmol/L (ref 134–144)
Total Protein: 6.9 g/dL (ref 6.0–8.5)
eGFR: 70 mL/min/{1.73_m2} (ref >59)

## 2021-07-27 LAB — CBC WITH DIFFERENTIAL/PLATELET
Basophils Absolute: 0 10*3/uL (ref 0.0–0.2)
Basos: 1 %
EOS (ABSOLUTE): 0.1 10*3/uL (ref 0.0–0.4)
Eos: 2 %
Hematocrit: 40 % (ref 34.0–46.6)
Hemoglobin: 12.9 g/dL (ref 11.1–15.9)
Immature Grans (Abs): 0 10*3/uL (ref 0.0–0.1)
Immature Granulocytes: 0 %
Lymphocytes Absolute: 0.8 10*3/uL (ref 0.7–3.1)
Lymphs: 15 %
MCH: 30.1 pg (ref 26.6–33.0)
MCHC: 32.3 g/dL (ref 31.5–35.7)
MCV: 94 fL (ref 79–97)
Monocytes Absolute: 0.6 10*3/uL (ref 0.1–0.9)
Monocytes: 11 %
Neutrophils Absolute: 4 10*3/uL (ref 1.4–7.0)
Neutrophils: 71 %
Platelets: 262 10*3/uL (ref 150–450)
RBC: 4.28 x10E6/uL (ref 3.77–5.28)
RDW: 13.2 % (ref 11.7–15.4)
WBC: 5.5 10*3/uL (ref 3.4–10.8)

## 2021-07-27 LAB — CARDIOVASCULAR RISK ASSESSMENT

## 2021-07-27 LAB — LIPID PANEL
Chol/HDL Ratio: 2.5 ratio (ref 0.0–4.4)
Cholesterol, Total: 162 mg/dL (ref 100–199)
HDL: 66 mg/dL (ref >39)
LDL Chol Calc (NIH): 78 mg/dL (ref 0–99)
Triglycerides: 100 mg/dL (ref 0–149)
VLDL Cholesterol Cal: 18 mg/dL (ref 5–40)

## 2021-07-27 LAB — TSH: TSH: 0.923 u[IU]/mL (ref 0.450–4.500)

## 2021-08-10 ENCOUNTER — Other Ambulatory Visit: Payer: Medicare Other

## 2021-08-10 DIAGNOSIS — R899 Unspecified abnormal finding in specimens from other organs, systems and tissues: Secondary | ICD-10-CM

## 2021-08-11 ENCOUNTER — Other Ambulatory Visit (HOSPITAL_COMMUNITY): Payer: Medicare Other

## 2021-08-11 LAB — COMPREHENSIVE METABOLIC PANEL
ALT: 12 IU/L (ref 0–32)
AST: 25 IU/L (ref 0–40)
Albumin/Globulin Ratio: 1.6 (ref 1.2–2.2)
Albumin: 4.2 g/dL (ref 3.8–4.8)
Alkaline Phosphatase: 78 IU/L (ref 44–121)
BUN/Creatinine Ratio: 14 (ref 12–28)
BUN: 13 mg/dL (ref 8–27)
Bilirubin Total: <0.2 mg/dL (ref 0.0–1.2)
CO2: 23 mmol/L (ref 20–29)
Calcium: 9.7 mg/dL (ref 8.7–10.3)
Chloride: 105 mmol/L (ref 96–106)
Creatinine, Ser: 0.96 mg/dL (ref 0.57–1.00)
Globulin, Total: 2.6 g/dL (ref 1.5–4.5)
Glucose: 98 mg/dL (ref 70–99)
Potassium: 5.2 mmol/L (ref 3.5–5.2)
Sodium: 143 mmol/L (ref 134–144)
Total Protein: 6.8 g/dL (ref 6.0–8.5)
eGFR: 64 mL/min/{1.73_m2} (ref >59)

## 2021-08-12 DIAGNOSIS — Z79899 Other long term (current) drug therapy: Secondary | ICD-10-CM | POA: Diagnosis not present

## 2021-08-12 DIAGNOSIS — D0002 Carcinoma in situ of buccal mucosa: Secondary | ICD-10-CM | POA: Diagnosis not present

## 2021-08-12 DIAGNOSIS — K219 Gastro-esophageal reflux disease without esophagitis: Secondary | ICD-10-CM | POA: Diagnosis not present

## 2021-08-12 DIAGNOSIS — Z7982 Long term (current) use of aspirin: Secondary | ICD-10-CM | POA: Diagnosis not present

## 2021-08-12 DIAGNOSIS — K0510 Chronic gingivitis, plaque induced: Secondary | ICD-10-CM | POA: Diagnosis not present

## 2021-08-12 DIAGNOSIS — I2510 Atherosclerotic heart disease of native coronary artery without angina pectoris: Secondary | ICD-10-CM | POA: Diagnosis not present

## 2021-08-12 DIAGNOSIS — I10 Essential (primary) hypertension: Secondary | ICD-10-CM | POA: Diagnosis not present

## 2021-08-12 DIAGNOSIS — Z87891 Personal history of nicotine dependence: Secondary | ICD-10-CM | POA: Diagnosis not present

## 2021-08-12 DIAGNOSIS — K029 Dental caries, unspecified: Secondary | ICD-10-CM | POA: Diagnosis not present

## 2021-08-12 DIAGNOSIS — Z7902 Long term (current) use of antithrombotics/antiplatelets: Secondary | ICD-10-CM | POA: Diagnosis not present

## 2021-08-12 DIAGNOSIS — Z885 Allergy status to narcotic agent status: Secondary | ICD-10-CM | POA: Diagnosis not present

## 2021-08-12 DIAGNOSIS — E039 Hypothyroidism, unspecified: Secondary | ICD-10-CM | POA: Diagnosis not present

## 2021-08-23 ENCOUNTER — Other Ambulatory Visit: Payer: Self-pay | Admitting: Physician Assistant

## 2021-08-23 ENCOUNTER — Telehealth: Payer: Self-pay

## 2021-08-23 DIAGNOSIS — H9192 Unspecified hearing loss, left ear: Secondary | ICD-10-CM

## 2021-08-23 NOTE — Telephone Encounter (Signed)
Patient requesting referral to [facility] for hearing test. She has noticed hearing loss mostly in left ear for awhile. She is now wanting something done for this.   Royce Macadamia, [place] 08/23/21 2:15 PM

## 2021-08-25 ENCOUNTER — Other Ambulatory Visit: Payer: Self-pay | Admitting: Physician Assistant

## 2021-08-25 DIAGNOSIS — F419 Anxiety disorder, unspecified: Secondary | ICD-10-CM

## 2021-08-31 ENCOUNTER — Telehealth: Payer: Self-pay

## 2021-08-31 NOTE — Chronic Care Management (AMB) (Signed)
Chronic Care Management Pharmacy Assistant   Name: Tammy Brady  MRN: [id number] DOB: [birth year]   Reason for Encounter: General Adherence Call   Recent office visits:  07/26/21 Marge Duncans PA-C. Seen for HTN and Hypothyroidism. No med changes.   Recent consult visits:  08/12/21 (Oral Medicine) Melvyn Novas DDS. Seen for Carcinoma. No med changes.   07/19/21 (Oncology) Benay Pike MD. Seen for Carcinoma. D/C Metoprolol Tartrate 50mg  and Morphine Sulfate 5-10mg .  07/14/21 (Oncology) Gordy Councilman MD. Seen for Cancer. No med changes.   06/25/21 (Dentistry) Sandi Mariscal DMD. Seen for Exam. Ordered Sodium Fluoride 1/1%.   Hospital visits:  None  Medications: Outpatient Encounter Medications as of 08/31/2021  Medication Sig   acetaminophen (TYLENOL) 500 MG tablet Take 500 mg by mouth every 6 (six) hours as needed for mild pain.   aspirin EC 81 MG tablet Take 1 tablet (81 mg total) by mouth daily. Swallow whole.   calcium carbonate (OS-CAL) 600 MG TABS Take 600 mg by mouth daily.   diltiazem (CARDIZEM CD) 360 MG 24 hr capsule TAKE 1 CAPSULE BY MOUTH  DAILY   gabapentin (NEURONTIN) 300 MG capsule Take 300 mg by mouth 3 (three) times daily.   levothyroxine (SYNTHROID) 112 MCG tablet TAKE 1 TABLET BY MOUTH  DAILY   nitroGLYCERIN (NITROSTAT) 0.4 MG SL tablet Place 1 tablet (0.4 mg total) under the tongue every 5 (five) minutes as needed. (Patient taking differently: Place 0.4 mg under the tongue every 5 (five) minutes as needed for chest pain.)   Nutritional Supplements (FEEDING SUPPLEMENT, OSMOLITE 1.5 CAL,) LIQD Place 360 mLs into feeding tube in the morning, at noon, in the evening, and at bedtime.   Polyethylene Glycol 3350 (MIRALAX PO) Take 17 g by mouth daily as needed (constipation).   simvastatin (ZOCOR) 40 MG tablet TAKE 1 TABLET BY MOUTH AT  BEDTIME   sodium fluoride (SODIUM FLUORIDE 5000 PPM) 1.1 % GEL dental gel Place 1 pea-size drop into each tooth space of  fluoride trays once a day at bedtime.  Leave trays in for 5 minutes and then remove.  Spit out excess fluoride, but DO NOT rinse with water, eat or drink for at least 30 minutes after use.   traZODone (DESYREL) 50 MG tablet TAKE 1 TABLET BY MOUTH AT  BEDTIME   No facility-administered encounter medications on file as of 08/31/2021.    [place] for general disease state and medication adherence call.   Patient is not > 5 days past due for refill on the following medications per chart history:  [facility] Medications: Medication Name/mg Last Fill Days Supply Simvastatin 40mg   07/30/21 90ds     05/06/21 90ds   What concerns do you have about your medications? Pt denies any changes   The patient denies side effects with her medications.   How often do you forget or accidentally miss a dose? Never  Do you use a pillbox? No  Are you having any problems getting your medications from your pharmacy? No  Has the cost of your medications been a concern? No  Since last visit with CPP, no interventions have been made:   The patient has not had an ED visit since last contact.   The patient denies problems with their health.   she denies  concerns or questions for [place] at this time.   Pt just had surgery on her mouth from cancer and she is recovering from that and has a few  more appts that she has to address. Pt stated she is doing well and had no complaints.    Care Gaps: Last annual wellness visit?None noted  Dexa Scan: Never done Mammogram: 08/29/19 Colonoscopy: 04/09/18 Last eye exam / retinopathy screening?None Diabetic foot exam?None  Elray Mcgregor, [place] Pharmacist Assistant  [phone]

## 2021-09-01 DIAGNOSIS — Z13820 Encounter for screening for osteoporosis: Secondary | ICD-10-CM | POA: Diagnosis not present

## 2021-09-01 DIAGNOSIS — Z1231 Encounter for screening mammogram for malignant neoplasm of breast: Secondary | ICD-10-CM | POA: Diagnosis not present

## 2021-09-01 LAB — HM DEXA SCAN: HM Dexa Scan: NORMAL

## 2021-09-01 LAB — HM MAMMOGRAPHY

## 2021-09-03 ENCOUNTER — Encounter: Payer: Self-pay | Admitting: Physician Assistant

## 2021-09-03 DIAGNOSIS — J329 Chronic sinusitis, unspecified: Secondary | ICD-10-CM | POA: Diagnosis not present

## 2021-09-03 DIAGNOSIS — C031 Malignant neoplasm of lower gum: Secondary | ICD-10-CM | POA: Diagnosis not present

## 2021-09-08 ENCOUNTER — Other Ambulatory Visit: Payer: Self-pay

## 2021-09-08 ENCOUNTER — Ambulatory Visit: Payer: Medicare Other | Attending: Radiation Oncology

## 2021-09-08 DIAGNOSIS — R471 Dysarthria and anarthria: Secondary | ICD-10-CM | POA: Diagnosis not present

## 2021-09-08 DIAGNOSIS — R1311 Dysphagia, oral phase: Secondary | ICD-10-CM | POA: Diagnosis not present

## 2021-09-08 NOTE — Therapy (Signed)
[facility] [address] [place], [place], [address] Phone: [phone]   Fax:  [phone]  Speech Language Pathology Treatment/renewal summary  Patient Details  Name: Tammy Brady MRN: [id number] Date of Birth: 04-Apr-1951 Referring Provider (SLP): Eppie Gibson, MD   Encounter Date: 09/08/2021   End of Session - 09/08/21 1306     Visit Number 5    Number of Visits 7    Date for SLP Re-Evaluation 12/07/21    SLP Start Time 8    SLP Stop Time  1055    SLP Time Calculation (min) 35 min    Activity Tolerance Patient tolerated treatment well;Patient limited by pain             Past Medical History:  Diagnosis Date   Aortic atherosclerosis ([place])    CAD (coronary artery disease)    Mild non-obstructive disease by cath January 2011. Coronary CTA showed  mild (25-49%) plaque in the RCA and minimal (<25%) coronary CTA 04/2021 with coronary Ca score 257   Carotid artery disease ([facility])    Followed by Dr Kellie Simmering   GERD (gastroesophageal reflux disease)    Hyperlipidemia    Hypertension    Hypothyroidism     Past Surgical History:  Procedure Laterality Date   APPENDECTOMY     IR IMAGING GUIDED PORT INSERTION  03/02/2021    There were no vitals filed for this visit.   Subjective Assessment - 09/08/21 1255     Subjective Pt has not used tube for two weeks. Surgeon at [facility] not ready to remove. Having sx to reduce volume of flap - needs to be scheduled.    Currently in Pain? No/denies                   ADULT SLP TREATMENT - 09/08/21 1028       General Information   Behavior/Cognition Cooperative;Alert;Pleasant mood      Treatment Provided   Treatment provided Dysphagia      Dysphagia Treatment   Temperature Spikes Noted No    Respiratory Status Room air    Oral Cavity - Dentition Missing dentition    Treatment Methods Skilled observation;Therapeutic exercise;Compensation strategy  training;Patient/caregiver education    Patient observed directly with PO's Yes    Type of PO's observed Dysphagia 1 (puree);Thin liquids   due to missing dentition and limitations on food available in the clinic   Oral Phase Signs & Symptoms Right pocketing;Anterior loss/spillage   anterior loss on rt 1/10 sips "It happens rarely in the morning" (due to lymphedema)   Pharyngeal Phase Signs & Symptoms Other (comment)   no overt s/sx   Other treatment/comments Drumstick, cucumbers and tomatoes with salad dressing, grilled cheese minus crust (difficult to eat due to xerostomia) brokeni into small pieces, hot cereal in the last 3 days. Tammy Brady cannot use upper incisors, and has figured out compensatory strategy/ies to orally manipulate bolus to and from lt side or oral cavity. Today she completed HEP with independence, but reports not performing HEP to recommended frequency ("I figured since I was eating so much more  - I let up on them."), and SLP reminded pt of rationale and recommended frequency; She stated she would do better starting today and understood the rationale. She is eager to have flap reduction sx and get fitted for some new teeth.      Assessment / Recommendations / Plan   Plan Continue with current plan of care  Dysphagia Recommendations   Diet recommendations --   as tolerated   Medication Administration Whole meds with liquid      Progression Toward Goals   Progression toward goals Not progressing toward goals (comment)   HEP at suboptimal frequency             SLP Education - 09/08/21 1306     Education Details HEP rationale, frequency must be BID    Person(s) Educated Patient    Methods Explanation    Comprehension Verbalized understanding              SLP Short Term Goals - 07/13/21 1053       SLP SHORT TERM GOAL #1   Title pt will complete HEP with rare min A    Time 1    Period --   visit, for all STGs   Status Not Met   andcontinued     SLP SHORT  TERM GOAL #2   Title pt will tell SLP why pt is completing HEP with modified independence    Time 1    Status Partially Met      SLP SHORT TERM GOAL #3   Title pt will describe 3 overt s/s aspiration PNA with modified independence    Status Achieved      SLP SHORT TERM GOAL #4   Title pt will tell SLP how a food journal could hasten return to a more normalized diet    Status Achieved              SLP Long Term Goals - 09/08/21 1311       SLP LONG TERM GOAL #1   Title pt will complete HEP with modified independence over three visits    Baseline 07-13-21, 09-08-21    Time 3    Period --   visits, for all LTGs   Status Revised   renewed 06-11-21   Target Date 12/07/21      SLP LONG TERM GOAL #2   Title pt will describe how to modify HEP over time, and the timeline associated with reduction in HEP frequency with modified independence over two sessions    Baseline 09-08-21    Time 3    Status On-going   renewed 06-11-21   Target Date 12/07/21              Plan - 09/08/21 1307     Clinical Impression Statement At this time pt cont to exhibit at least cont'd oral stage dysphagia due mainly to surgical changes with mandible. Tammy Brady's swallowing is deemed WNL/WFL with water and puree although pt could likely tolerate dys II and III items as well. She reports eating a wider variety of foods from dys I-III. She has not used PEG in two weeks, and has maintained weight about 147-148lbs for 7 days. SLP reviewed pt's individualized HEP for dysphagia- pt cont'd independent.  Data indicate that pt's swallow ability will likely decrease over the course of radiation therapy and could very well decline over time following conclusion of their radiation therapy due to muscle disuse atrophy and/or muscle fibrosis. Pt to have flap revision sx in mid-May and she will cont to need to be seen by SLP in order to assess safety of PO intake, assess the need for recommending any objective swallow assessment, and  ensuring pt correctly completes the individualized HEP.    Speech Therapy Frequency --   once approx every 8 weeks   Duration --   total  7 visits   Treatment/Interventions Aspiration precaution training;Pharyngeal strengthening exercises;Diet toleration management by SLP;Trials of upgraded texture/liquids;Internal/external aids;Patient/family education;Compensatory strategies;SLP instruction and feedback    Potential to Achieve Goals Good    SLP Home Exercise Plan provided    Consulted and Agree with Plan of Care Patient             Patient will benefit from skilled therapeutic intervention in order to improve the following deficits and impairments:   Dysphagia, oral phase  Dysarthria and anarthria    Problem List Patient Active Problem List   Diagnosis Date Noted   History of radiation to head and neck region 06/25/2021   Encounter for dental examination 06/25/2021   Xerostomia due to radiotherapy 06/25/2021   Dysgeusia 06/25/2021   Trismus 06/25/2021   Encounter for general adult medical examination with abnormal findings 06/06/2021   Encounter for osteoporosis screening in asymptomatic postmenopausal patient 06/06/2021   Visit for screening mammogram 06/06/2021   Need for immunization against influenza 06/06/2021   Symptomatic anemia 05/18/2021   CAP (community acquired pneumonia) 05/17/2021   Normocytic anemia 05/17/2021   Moderate protein-calorie malnutrition ([place]) 05/17/2021   Hyponatremia 05/17/2021   G tube feedings ([place]) 04/29/2021   Aortic atherosclerosis ([place]) 04/25/2021   Chest pain of uncertain etiology 85/92/9244   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 chemotherapy ([place]) 04/07/2021   Mucositis due to antineoplastic therapy 03/31/2021   Drug induced constipation 03/31/2021   Chemotherapy-induced nausea 03/31/2021   Port-A-Cath in place 03/09/2021   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   Carcinoma of lower gum ([place]) 02/20/2021   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[place]) 12/08/2020   Gastritis 12/08/2020   Anxiety 12/08/2020   Need for prophylactic vaccination and inoculation against influenza 07/30/2020   Depression 11/26/2015   GERD (gastroesophageal reflux disease) 11/26/2015   Migraine headache 11/26/2015   Screening for diabetes mellitus 09/03/2015   Hypothyroidism 07/22/2009   Mixed hyperlipidemia 07/22/2009   Hypertension, essential 07/22/2009   CAD, NATIVE VESSEL 07/22/2009   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   Coronary artery spasm ([place]) 07/22/2009    [place], [place] 09/08/2021, 1:12 PM  [facility] [address], [place], [place], [address] Phone: [phone]   Fax:  [phone]   Name: Tammy Brady MRN: [id number] Date of Birth: 1951/06/12

## 2021-09-08 NOTE — Patient Instructions (Signed)
?  Complete the exercises twice each day until May 15th, then do them 2-3 times a week. ?

## 2021-09-14 DIAGNOSIS — C031 Malignant neoplasm of lower gum: Secondary | ICD-10-CM | POA: Insufficient documentation

## 2021-09-24 DIAGNOSIS — C031 Malignant neoplasm of lower gum: Secondary | ICD-10-CM | POA: Diagnosis not present

## 2021-09-24 DIAGNOSIS — C060 Malignant neoplasm of cheek mucosa: Secondary | ICD-10-CM | POA: Diagnosis not present

## 2021-10-06 DIAGNOSIS — C031 Malignant neoplasm of lower gum: Secondary | ICD-10-CM | POA: Diagnosis not present

## 2021-10-06 DIAGNOSIS — C040 Malignant neoplasm of anterior floor of mouth: Secondary | ICD-10-CM | POA: Diagnosis not present

## 2021-10-06 DIAGNOSIS — Z9889 Other specified postprocedural states: Secondary | ICD-10-CM | POA: Diagnosis not present

## 2021-10-13 DIAGNOSIS — J700 Acute pulmonary manifestations due to radiation: Secondary | ICD-10-CM | POA: Diagnosis not present

## 2021-10-13 DIAGNOSIS — Z85818 Personal history of malignant neoplasm of other sites of lip, oral cavity, and pharynx: Secondary | ICD-10-CM | POA: Diagnosis not present

## 2021-10-13 DIAGNOSIS — Z79899 Other long term (current) drug therapy: Secondary | ICD-10-CM | POA: Diagnosis not present

## 2021-10-13 DIAGNOSIS — Z7982 Long term (current) use of aspirin: Secondary | ICD-10-CM | POA: Diagnosis not present

## 2021-10-13 DIAGNOSIS — G8929 Other chronic pain: Secondary | ICD-10-CM | POA: Diagnosis not present

## 2021-10-13 DIAGNOSIS — I2510 Atherosclerotic heart disease of native coronary artery without angina pectoris: Secondary | ICD-10-CM | POA: Diagnosis not present

## 2021-10-13 DIAGNOSIS — E039 Hypothyroidism, unspecified: Secondary | ICD-10-CM | POA: Diagnosis not present

## 2021-10-13 DIAGNOSIS — Z87891 Personal history of nicotine dependence: Secondary | ICD-10-CM | POA: Diagnosis not present

## 2021-10-13 DIAGNOSIS — C760 Malignant neoplasm of head, face and neck: Secondary | ICD-10-CM | POA: Diagnosis not present

## 2021-10-13 DIAGNOSIS — R9389 Abnormal findings on diagnostic imaging of other specified body structures: Secondary | ICD-10-CM | POA: Diagnosis not present

## 2021-10-13 DIAGNOSIS — Z08 Encounter for follow-up examination after completed treatment for malignant neoplasm: Secondary | ICD-10-CM | POA: Diagnosis not present

## 2021-10-13 DIAGNOSIS — I10 Essential (primary) hypertension: Secondary | ICD-10-CM | POA: Diagnosis not present

## 2021-10-13 DIAGNOSIS — C031 Malignant neoplasm of lower gum: Secondary | ICD-10-CM | POA: Diagnosis not present

## 2021-10-13 DIAGNOSIS — K219 Gastro-esophageal reflux disease without esophagitis: Secondary | ICD-10-CM | POA: Diagnosis not present

## 2021-10-13 DIAGNOSIS — J439 Emphysema, unspecified: Secondary | ICD-10-CM | POA: Diagnosis not present

## 2021-10-13 DIAGNOSIS — Z885 Allergy status to narcotic agent status: Secondary | ICD-10-CM | POA: Diagnosis not present

## 2021-10-14 DIAGNOSIS — H9190 Unspecified hearing loss, unspecified ear: Secondary | ICD-10-CM | POA: Diagnosis not present

## 2021-10-14 DIAGNOSIS — H903 Sensorineural hearing loss, bilateral: Secondary | ICD-10-CM | POA: Diagnosis not present

## 2021-10-14 DIAGNOSIS — Z923 Personal history of irradiation: Secondary | ICD-10-CM | POA: Diagnosis not present

## 2021-10-14 DIAGNOSIS — H9319 Tinnitus, unspecified ear: Secondary | ICD-10-CM | POA: Diagnosis not present

## 2021-10-14 DIAGNOSIS — C049 Malignant neoplasm of floor of mouth, unspecified: Secondary | ICD-10-CM | POA: Diagnosis not present

## 2021-10-14 DIAGNOSIS — Z9221 Personal history of antineoplastic chemotherapy: Secondary | ICD-10-CM | POA: Diagnosis not present

## 2021-10-14 DIAGNOSIS — J342 Deviated nasal septum: Secondary | ICD-10-CM | POA: Diagnosis not present

## 2021-10-14 DIAGNOSIS — Z9889 Other specified postprocedural states: Secondary | ICD-10-CM | POA: Diagnosis not present

## 2021-10-26 ENCOUNTER — Other Ambulatory Visit: Payer: Self-pay

## 2021-10-26 ENCOUNTER — Inpatient Hospital Stay: Payer: Medicare Other | Attending: Hematology and Oncology

## 2021-10-26 ENCOUNTER — Telehealth: Payer: Self-pay

## 2021-10-26 DIAGNOSIS — Z95828 Presence of other vascular implants and grafts: Secondary | ICD-10-CM

## 2021-10-26 DIAGNOSIS — T451X5D Adverse effect of antineoplastic and immunosuppressive drugs, subsequent encounter: Secondary | ICD-10-CM | POA: Diagnosis not present

## 2021-10-26 DIAGNOSIS — E039 Hypothyroidism, unspecified: Secondary | ICD-10-CM | POA: Insufficient documentation

## 2021-10-26 DIAGNOSIS — K1231 Oral mucositis (ulcerative) due to antineoplastic therapy: Secondary | ICD-10-CM | POA: Diagnosis not present

## 2021-10-26 DIAGNOSIS — Z87891 Personal history of nicotine dependence: Secondary | ICD-10-CM | POA: Diagnosis not present

## 2021-10-26 DIAGNOSIS — D649 Anemia, unspecified: Secondary | ICD-10-CM | POA: Insufficient documentation

## 2021-10-26 DIAGNOSIS — I890 Lymphedema, not elsewhere classified: Secondary | ICD-10-CM | POA: Insufficient documentation

## 2021-10-26 DIAGNOSIS — C060 Malignant neoplasm of cheek mucosa: Secondary | ICD-10-CM | POA: Insufficient documentation

## 2021-10-26 MED ORDER — HEPARIN SOD (PORK) LOCK FLUSH 100 UNIT/ML IV SOLN
500.0000 [IU] | Freq: Once | INTRAVENOUS | Status: AC
  Administered 2021-10-26: 500 [IU]

## 2021-10-26 MED ORDER — SODIUM CHLORIDE 0.9% FLUSH
10.0000 mL | Freq: Once | INTRAVENOUS | Status: AC
  Administered 2021-10-26: 10 mL

## 2021-10-26 NOTE — Progress Notes (Signed)
? ? ?Chronic Care Management ?Pharmacy Assistant  ? ?Name: Tammy Brady  MRN: [id number] DOB: [birth year] ? ? ?Reason for Encounter: General Adherence Call  ?  ?Recent office visits:  ?None ? ?Recent consult visits:  ?10/13/21 (Oncology) Ileene Rubens MD. Seen for Carcinoma of lower gingiva. No med changes. ? ?10/04/21 (Dentistry) Epimenio Sarin. Seen for squamous cell carcinoma.No med changes. ? ?3//23 (Surgery Oncology) Conni Slipper FNP.  Seen for squamous cell carcinoma.No med changes. ? ?09/06/21 (Dentistry) Caryl Never DDS. Seen for Carcinoma in situ of oral mucosa. Ordered Prevident 5000 Booster 1.1% pste.  ? ?09/03/21 (Oncology) Lennox Grumbles MD. Seen for Carcinoma of lower gingiva. Ordered Peridex 0.12 % solution and Flonase nasal spray 35mg.  ? ?Hospital visits:  ?None ? ?Medications: ?Outpatient Encounter Medications as of 10/26/2021  ?Medication Sig  ? acetaminophen (TYLENOL) 500 MG tablet Take 500 mg by mouth every 6 (six) hours as needed for mild pain.  ? aspirin EC 81 MG tablet Take 1 tablet (81 mg total) by mouth daily. Swallow whole.  ? calcium carbonate (OS-CAL) 600 MG TABS Take 600 mg by mouth daily.  ? diltiazem (CARDIZEM CD) 360 MG 24 hr capsule TAKE 1 CAPSULE BY MOUTH  DAILY  ? gabapentin (NEURONTIN) 300 MG capsule Take 300 mg by mouth 3 (three) times daily.  ? levothyroxine (SYNTHROID) 112 MCG tablet TAKE 1 TABLET BY MOUTH  DAILY  ? nitroGLYCERIN (NITROSTAT) 0.4 MG SL tablet Place 1 tablet (0.4 mg total) under the tongue every 5 (five) minutes as needed. (Patient taking differently: Place 0.4 mg under the tongue every 5 (five) minutes as needed for chest pain.)  ? Nutritional Supplements (FEEDING SUPPLEMENT, OSMOLITE 1.5 CAL,) LIQD Place 360 mLs into feeding tube in the morning, at noon, in the evening, and at bedtime.  ? Polyethylene Glycol 3350 (MIRALAX PO) Take 17 g by mouth daily as needed (constipation).  ? simvastatin (ZOCOR) 40 MG tablet TAKE 1 TABLET BY MOUTH AT   BEDTIME  ? sodium fluoride (SODIUM FLUORIDE 5000 PPM) 1.1 % GEL dental gel Place 1 pea-size drop into each tooth space of fluoride trays once a day at bedtime.  Leave trays in for 5 minutes and then remove.  Spit out excess fluoride, but DO NOT rinse with water, eat or drink for at least 30 minutes after use.  ? traZODone (DESYREL) 50 MG tablet TAKE 1 TABLET BY MOUTH AT  BEDTIME  ? ?No facility-administered encounter medications on file as of 10/26/2021.  ? ? ?[place] medication [place].  ? ?Patient is not > 5 days past due for refill on the following medications per chart history: ? ?Star Medications: ?Medication Name/mg Last Fill Days Supply ?Simvastatin '40mg'$                    10/23/21 90ds ?07/30/21            90ds ?      ?What concerns do you have about your medications? Pt has no concerns at this time  ? ?The patient denies side effects with her medications.  ? ?How often do you forget or accidentally miss a dose? Never ? ?Do you use a pillbox? No ? ?Are you having any problems getting your medications from your pharmacy? No ? ?Has the cost of your medications been a concern? No ? ?Since last visit with CPP, no interventions have been made:  ? ?The patient has not had an  ED visit since last contact.  ? ?The patient denies problems with their health.  ? ?she denies  concerns or questions for [place], at this time.  ? ? ?Care Gaps: ?Last annual wellness visit?None noted  ?Dexa Scan: Never done ?Mammogram: 08/29/19 ?Colonoscopy: 04/09/18 ?Last eye exam / retinopathy screening?None ?Diabetic foot exam?None ? ?Elray Mcgregor, CMA ?Clinical Pharmacist Assistant  ?[phone]  ?

## 2021-11-24 ENCOUNTER — Ambulatory Visit: Payer: Medicare Other | Admitting: Physician Assistant

## 2021-12-02 DIAGNOSIS — Z85830 Personal history of malignant neoplasm of bone: Secondary | ICD-10-CM | POA: Diagnosis not present

## 2021-12-02 DIAGNOSIS — Z9889 Other specified postprocedural states: Secondary | ICD-10-CM | POA: Diagnosis not present

## 2021-12-02 DIAGNOSIS — L7682 Other postprocedural complications of skin and subcutaneous tissue: Secondary | ICD-10-CM | POA: Diagnosis not present

## 2021-12-02 DIAGNOSIS — F32A Depression, unspecified: Secondary | ICD-10-CM | POA: Diagnosis not present

## 2021-12-02 DIAGNOSIS — I10 Essential (primary) hypertension: Secondary | ICD-10-CM | POA: Diagnosis not present

## 2021-12-02 DIAGNOSIS — I252 Old myocardial infarction: Secondary | ICD-10-CM | POA: Diagnosis not present

## 2021-12-02 DIAGNOSIS — Z87891 Personal history of nicotine dependence: Secondary | ICD-10-CM | POA: Diagnosis not present

## 2021-12-02 DIAGNOSIS — K219 Gastro-esophageal reflux disease without esophagitis: Secondary | ICD-10-CM | POA: Diagnosis not present

## 2021-12-02 DIAGNOSIS — Z9221 Personal history of antineoplastic chemotherapy: Secondary | ICD-10-CM | POA: Diagnosis not present

## 2021-12-02 DIAGNOSIS — Z7982 Long term (current) use of aspirin: Secondary | ICD-10-CM | POA: Diagnosis not present

## 2021-12-02 DIAGNOSIS — Z538 Procedure and treatment not carried out for other reasons: Secondary | ICD-10-CM | POA: Diagnosis not present

## 2021-12-02 DIAGNOSIS — R040 Epistaxis: Secondary | ICD-10-CM | POA: Diagnosis not present

## 2021-12-02 DIAGNOSIS — G894 Chronic pain syndrome: Secondary | ICD-10-CM | POA: Diagnosis not present

## 2021-12-02 DIAGNOSIS — C031 Malignant neoplasm of lower gum: Secondary | ICD-10-CM | POA: Diagnosis not present

## 2021-12-02 DIAGNOSIS — Z885 Allergy status to narcotic agent status: Secondary | ICD-10-CM | POA: Diagnosis not present

## 2021-12-02 DIAGNOSIS — Z923 Personal history of irradiation: Secondary | ICD-10-CM | POA: Diagnosis not present

## 2021-12-02 DIAGNOSIS — Z79899 Other long term (current) drug therapy: Secondary | ICD-10-CM | POA: Diagnosis not present

## 2021-12-02 DIAGNOSIS — E039 Hypothyroidism, unspecified: Secondary | ICD-10-CM | POA: Diagnosis not present

## 2021-12-02 DIAGNOSIS — J439 Emphysema, unspecified: Secondary | ICD-10-CM | POA: Diagnosis not present

## 2021-12-02 DIAGNOSIS — I2510 Atherosclerotic heart disease of native coronary artery without angina pectoris: Secondary | ICD-10-CM | POA: Diagnosis not present

## 2021-12-03 ENCOUNTER — Ambulatory Visit: Payer: Medicare Other | Admitting: Physician Assistant

## 2021-12-03 DIAGNOSIS — Z7982 Long term (current) use of aspirin: Secondary | ICD-10-CM | POA: Diagnosis not present

## 2021-12-03 DIAGNOSIS — E039 Hypothyroidism, unspecified: Secondary | ICD-10-CM | POA: Diagnosis not present

## 2021-12-03 DIAGNOSIS — G894 Chronic pain syndrome: Secondary | ICD-10-CM | POA: Diagnosis not present

## 2021-12-03 DIAGNOSIS — I252 Old myocardial infarction: Secondary | ICD-10-CM | POA: Diagnosis not present

## 2021-12-03 DIAGNOSIS — C031 Malignant neoplasm of lower gum: Secondary | ICD-10-CM | POA: Diagnosis not present

## 2021-12-03 DIAGNOSIS — Z79899 Other long term (current) drug therapy: Secondary | ICD-10-CM | POA: Diagnosis not present

## 2021-12-03 DIAGNOSIS — Z923 Personal history of irradiation: Secondary | ICD-10-CM | POA: Diagnosis not present

## 2021-12-03 DIAGNOSIS — Z87891 Personal history of nicotine dependence: Secondary | ICD-10-CM | POA: Diagnosis not present

## 2021-12-03 DIAGNOSIS — Z9221 Personal history of antineoplastic chemotherapy: Secondary | ICD-10-CM | POA: Diagnosis not present

## 2021-12-03 DIAGNOSIS — R040 Epistaxis: Secondary | ICD-10-CM | POA: Diagnosis not present

## 2021-12-03 DIAGNOSIS — I10 Essential (primary) hypertension: Secondary | ICD-10-CM | POA: Diagnosis not present

## 2021-12-03 DIAGNOSIS — Z85830 Personal history of malignant neoplasm of bone: Secondary | ICD-10-CM | POA: Diagnosis not present

## 2021-12-03 DIAGNOSIS — Z885 Allergy status to narcotic agent status: Secondary | ICD-10-CM | POA: Diagnosis not present

## 2021-12-03 DIAGNOSIS — I2510 Atherosclerotic heart disease of native coronary artery without angina pectoris: Secondary | ICD-10-CM | POA: Diagnosis not present

## 2021-12-03 DIAGNOSIS — J439 Emphysema, unspecified: Secondary | ICD-10-CM | POA: Diagnosis not present

## 2021-12-03 DIAGNOSIS — F32A Depression, unspecified: Secondary | ICD-10-CM | POA: Diagnosis not present

## 2021-12-03 DIAGNOSIS — L7682 Other postprocedural complications of skin and subcutaneous tissue: Secondary | ICD-10-CM | POA: Diagnosis not present

## 2021-12-03 DIAGNOSIS — Z538 Procedure and treatment not carried out for other reasons: Secondary | ICD-10-CM | POA: Diagnosis not present

## 2021-12-08 DIAGNOSIS — R040 Epistaxis: Secondary | ICD-10-CM | POA: Diagnosis not present

## 2021-12-08 DIAGNOSIS — C031 Malignant neoplasm of lower gum: Secondary | ICD-10-CM | POA: Diagnosis not present

## 2021-12-08 DIAGNOSIS — Z09 Encounter for follow-up examination after completed treatment for conditions other than malignant neoplasm: Secondary | ICD-10-CM | POA: Diagnosis not present

## 2021-12-10 ENCOUNTER — Telehealth: Payer: Self-pay

## 2021-12-10 NOTE — Progress Notes (Signed)
Chronic Care Management Pharmacy Assistant   Name: BARRI NEIDLINGER  MRN: [id number] DOB: 1950-07-31   Reason for Encounter: General Adherence Call    Recent office visits:  None  Recent consult visits:  None  Hospital visits:  None  Medications: Outpatient Encounter Medications as of 12/10/2021  Medication Sig   acetaminophen (TYLENOL) 500 MG tablet Take 500 mg by mouth every 6 (six) hours as needed for mild pain.   aspirin EC 81 MG tablet Take 1 tablet (81 mg total) by mouth daily. Swallow whole.   calcium carbonate (OS-CAL) 600 MG TABS Take 600 mg by mouth daily.   diltiazem (CARDIZEM CD) 360 MG 24 hr capsule TAKE 1 CAPSULE BY MOUTH  DAILY   gabapentin (NEURONTIN) 300 MG capsule Take 300 mg by mouth 3 (three) times daily.   levothyroxine (SYNTHROID) 112 MCG tablet TAKE 1 TABLET BY MOUTH  DAILY   nitroGLYCERIN (NITROSTAT) 0.4 MG SL tablet Place 1 tablet (0.4 mg total) under the tongue every 5 (five) minutes as needed. (Patient taking differently: Place 0.4 mg under the tongue every 5 (five) minutes as needed for chest pain.)   Nutritional Supplements (FEEDING SUPPLEMENT, OSMOLITE 1.5 CAL,) LIQD Place 360 mLs into feeding tube in the morning, at noon, in the evening, and at bedtime.   Polyethylene Glycol 3350 (MIRALAX PO) Take 17 g by mouth daily as needed (constipation).   simvastatin (ZOCOR) 40 MG tablet TAKE 1 TABLET BY MOUTH AT  BEDTIME   sodium fluoride (SODIUM FLUORIDE 5000 PPM) 1.1 % GEL dental gel Place 1 pea-size drop into each tooth space of fluoride trays once a day at bedtime.  Leave trays in for 5 minutes and then remove.  Spit out excess fluoride, but DO NOT rinse with water, eat or drink for at least 30 minutes after use.   traZODone (DESYREL) 50 MG tablet TAKE 1 TABLET BY MOUTH AT  BEDTIME   No facility-administered encounter medications on file as of 12/10/2021.    [place] for General Review Call   Chart Review:  Have there been any documented new,  changed, or discontinued medications since last visit? No  Has there been any documented recent hospitalizations or ED visits since last visit with Clinical Pharmacist? No Brief Summary (including medication and/or Diagnosis changes):None   Adherence Review:  Does the Clinical Pharmacist Assistant have access to adherence rates? Yes Adherence rates for STAR metric medications (List medication(s)/day supply/ last 2 fill dates).Simvastatin 10/23/21, 07/30/21 90ds Adherence rates for medications indicated for disease state being reviewed (List medication(s)/day supply/ last 2 fill dates). Does the patient have >5 day gap between last estimated fill dates for any of the above medications or other medication gaps? No Reason for medication gaps.None   Disease State Questions:  Able to connect with Patient? Yes Did patient have any problems with their health recently? No Note problems and Concerns: Have you had any admissions or emergency room visits or worsening of your condition(s) since last visit? No Details of ED visit, hospital visit and/or worsening condition(s): Have you had any visits with new specialists or providers since your last visit? No Explain: Have you had any new health care problem(s) since your last visit? No New problem(s) reported: Have you run out of any of your medications since you last spoke with clinical pharmacist? No What caused you to run out of your medications? Are there any medications you are not taking as prescribed? No What kept you from taking  your medications as prescribed? Are you having any issues or side effects with your medications? No Note of issues or side effects: Do you have any other health concerns or questions you want to discuss with your Clinical Pharmacist before your next visit? No Note additional concerns and questions from Patient. Are there any health concerns that you feel we can do a better job addressing? No Note Patient's  response. Are you having any problems with any of the following since the last visit: (select all that apply)  None  Details: 12. Any falls since last visit? No  Details: 13. Any increased or uncontrolled pain since last visit? No  Details: 14. Next visit Type: office       Visit with:Sara Rosana Hoes         Date:12/16/21        Time:3:00pm  15. Additional Details? Schedule appt for July    Danielle [place], [place] Pharmacist Assistant  [phone]

## 2021-12-16 ENCOUNTER — Ambulatory Visit (INDEPENDENT_AMBULATORY_CARE_PROVIDER_SITE_OTHER): Payer: Medicare Other | Admitting: Physician Assistant

## 2021-12-16 VITALS — BP 124/64 | HR 67 | Resp 16 | Ht 65.0 in | Wt 135.0 lb

## 2021-12-16 DIAGNOSIS — E782 Mixed hyperlipidemia: Secondary | ICD-10-CM

## 2021-12-16 DIAGNOSIS — I10 Essential (primary) hypertension: Secondary | ICD-10-CM

## 2021-12-16 DIAGNOSIS — F419 Anxiety disorder, unspecified: Secondary | ICD-10-CM

## 2021-12-16 DIAGNOSIS — C060 Malignant neoplasm of cheek mucosa: Secondary | ICD-10-CM | POA: Diagnosis not present

## 2021-12-16 DIAGNOSIS — E559 Vitamin D deficiency, unspecified: Secondary | ICD-10-CM

## 2021-12-16 DIAGNOSIS — J432 Centrilobular emphysema: Secondary | ICD-10-CM

## 2021-12-16 DIAGNOSIS — E039 Hypothyroidism, unspecified: Secondary | ICD-10-CM | POA: Diagnosis not present

## 2021-12-16 MED ORDER — DILTIAZEM HCL ER COATED BEADS 300 MG PO CP24: 300.0000 mg | ORAL_CAPSULE | Freq: Every day | ORAL | 1 refills | Status: DC

## 2021-12-16 MED ORDER — FLUTICASONE PROPIONATE HFA 110 MCG/ACT IN AERO: 2.0000 | INHALATION_SPRAY | Freq: Two times a day (BID) | RESPIRATORY_TRACT | 12 refills | Status: DC

## 2021-12-17 ENCOUNTER — Other Ambulatory Visit: Payer: Medicare Other

## 2021-12-17 DIAGNOSIS — I10 Essential (primary) hypertension: Secondary | ICD-10-CM | POA: Diagnosis not present

## 2021-12-17 DIAGNOSIS — E039 Hypothyroidism, unspecified: Secondary | ICD-10-CM | POA: Diagnosis not present

## 2021-12-17 DIAGNOSIS — E559 Vitamin D deficiency, unspecified: Secondary | ICD-10-CM | POA: Diagnosis not present

## 2021-12-17 DIAGNOSIS — E782 Mixed hyperlipidemia: Secondary | ICD-10-CM | POA: Diagnosis not present

## 2021-12-18 LAB — LIPID PANEL
Chol/HDL Ratio: 2.1 ratio (ref 0.0–4.4)
Cholesterol, Total: 137 mg/dL (ref 100–199)
HDL: 66 mg/dL (ref >39)
LDL Chol Calc (NIH): 57 mg/dL (ref 0–99)
Triglycerides: 69 mg/dL (ref 0–149)
VLDL Cholesterol Cal: 14 mg/dL (ref 5–40)

## 2021-12-18 LAB — CBC WITH DIFFERENTIAL/PLATELET
Basophils Absolute: 0 10*3/uL (ref 0.0–0.2)
Basos: 1 %
EOS (ABSOLUTE): 0.1 10*3/uL (ref 0.0–0.4)
Eos: 2 %
Hematocrit: 35.5 % (ref 34.0–46.6)
Hemoglobin: 11.2 g/dL (ref 11.1–15.9)
Immature Grans (Abs): 0 10*3/uL (ref 0.0–0.1)
Immature Granulocytes: 0 %
Lymphocytes Absolute: 1.1 10*3/uL (ref 0.7–3.1)
Lymphs: 24 %
MCH: 30.1 pg (ref 26.6–33.0)
MCHC: 31.5 g/dL (ref 31.5–35.7)
MCV: 95 fL (ref 79–97)
Monocytes Absolute: 0.5 10*3/uL (ref 0.1–0.9)
Monocytes: 12 %
Neutrophils Absolute: 2.8 10*3/uL (ref 1.4–7.0)
Neutrophils: 61 %
Platelets: 238 10*3/uL (ref 150–450)
RBC: 3.72 x10E6/uL — ABNORMAL LOW (ref 3.77–5.28)
RDW: 13.4 % (ref 11.7–15.4)
WBC: 4.6 10*3/uL (ref 3.4–10.8)

## 2021-12-18 LAB — COMPREHENSIVE METABOLIC PANEL
ALT: 11 IU/L (ref 0–32)
AST: 21 IU/L (ref 0–40)
Albumin/Globulin Ratio: 2.3 — ABNORMAL HIGH (ref 1.2–2.2)
Albumin: 4.1 g/dL (ref 3.8–4.8)
Alkaline Phosphatase: 60 IU/L (ref 44–121)
BUN/Creatinine Ratio: 17 (ref 12–28)
BUN: 15 mg/dL (ref 8–27)
Bilirubin Total: 0.2 mg/dL (ref 0.0–1.2)
CO2: 23 mmol/L (ref 20–29)
Calcium: 9.4 mg/dL (ref 8.7–10.3)
Chloride: 108 mmol/L — ABNORMAL HIGH (ref 96–106)
Creatinine, Ser: 0.89 mg/dL (ref 0.57–1.00)
Globulin, Total: 1.8 g/dL (ref 1.5–4.5)
Glucose: 96 mg/dL (ref 70–99)
Potassium: 5 mmol/L (ref 3.5–5.2)
Sodium: 144 mmol/L (ref 134–144)
Total Protein: 5.9 g/dL — ABNORMAL LOW (ref 6.0–8.5)
eGFR: 71 mL/min/{1.73_m2} (ref >59)

## 2021-12-18 LAB — CARDIOVASCULAR RISK ASSESSMENT

## 2021-12-18 LAB — TSH: TSH: 0.191 u[IU]/mL — ABNORMAL LOW (ref 0.450–4.500)

## 2021-12-18 LAB — VITAMIN D 25 HYDROXY (VIT D DEFICIENCY, FRACTURES): Vit D, 25-Hydroxy: 65.2 ng/mL (ref 30.0–100.0)

## 2021-12-19 ENCOUNTER — Other Ambulatory Visit: Payer: Self-pay | Admitting: Physician Assistant

## 2021-12-19 DIAGNOSIS — E038 Other specified hypothyroidism: Secondary | ICD-10-CM

## 2021-12-19 MED ORDER — LEVOTHYROXINE SODIUM 100 MCG PO TABS: 100.0000 ug | ORAL_TABLET | Freq: Every day | ORAL | 2 refills | Status: DC

## 2021-12-20 ENCOUNTER — Other Ambulatory Visit: Payer: Self-pay

## 2021-12-20 ENCOUNTER — Encounter: Payer: Self-pay | Admitting: Physician Assistant

## 2021-12-20 DIAGNOSIS — I10 Essential (primary) hypertension: Secondary | ICD-10-CM

## 2021-12-20 DIAGNOSIS — E559 Vitamin D deficiency, unspecified: Secondary | ICD-10-CM | POA: Insufficient documentation

## 2021-12-20 DIAGNOSIS — E038 Other specified hypothyroidism: Secondary | ICD-10-CM

## 2021-12-20 DIAGNOSIS — J432 Centrilobular emphysema: Secondary | ICD-10-CM | POA: Insufficient documentation

## 2021-12-20 MED ORDER — DILTIAZEM HCL ER COATED BEADS 300 MG PO CP24: 300.0000 mg | ORAL_CAPSULE | Freq: Every day | ORAL | 1 refills | Status: DC

## 2021-12-20 MED ORDER — DILTIAZEM HCL ER COATED BEADS 240 MG PO CP24: 240.0000 mg | ORAL_CAPSULE | Freq: Every day | ORAL | 1 refills | Status: DC

## 2021-12-20 MED ORDER — LEVOTHYROXINE SODIUM 100 MCG PO TABS: 100.0000 ug | ORAL_TABLET | Freq: Every day | ORAL | 2 refills | Status: DC

## 2021-12-20 NOTE — Progress Notes (Signed)
Subjective:  Patient ID: Tammy Brady, female    DOB: 04-Jul-1951  Age: 71 y.o. MRN: [id number]  Chief Complaint  Patient presents with   Hypertension    Hypertension   Pt presents for follow up of hypertension.The patient is tolerating the medication well without side effects. Compliance with treatment has been good; including taking medication as directed , maintains a healthy diet and regular exercise regimen , and following up as directed.pt taking cardizem CD '360mg'$  qd - however she states since losing weight (she feels because she is not able to eat well because of mouth surgery) that she has had episodes of low bp - recommend to decrease dose of cardizem  Mixed hyperlipidemia  Pt presents with hyperlipidemia.  Compliance with treatment has been good; The patient is compliant with medications, maintains a low cholesterol diet , follows up as directed , and maintains an exercise regimen . The patient denies experiencing any hypercholesterolemia related symptoms. Currently taking zocor '40mg'$  qd - due for labwork  Pt with history of hypothyroidism - on synthroid 173mg qd - due for labwork  Pt with history of  squamous cell carcinoma of oral mucosa - she [facility] and Dr BIhor Austin-- She also follows with Dr SLanell Persons(radiation oncologist) and Dr IChryl Heck- medical oncologist Pt had extensive oral surgery on 01/18/21 - she had her lower mandible, most of her teeth, soft tissue and lymph nodes removed - Pt did have a fibula flap done for reconstruction of oral cavity She states that her cancer is now in remission - is following with ENT, oncology and radiation oncologist At this time she is able to eat soft foods, drinking ensure but had her feeding tube removed She went in for revision of her mouth flap but had difficulties with anesthesia and the surgery had to be aborted due to nasal injury/bleeding/trauma  Pt has had follow up  chest Cts and is noted to have emphysema - she has not been using any inhalers but does feel she could benefit from one  Current Outpatient Medications on File Prior to Visit  Medication Sig Dispense Refill   acetaminophen (TYLENOL) 500 MG tablet Take 500 mg by mouth every 6 (six) hours as needed for mild pain.     aspirin EC 81 MG tablet Take 1 tablet (81 mg total) by mouth daily. Swallow whole. 90 tablet 3   calcium carbonate (OS-CAL) 600 MG TABS Take 600 mg by mouth daily.     gabapentin (NEURONTIN) 300 MG capsule Take 300 mg by mouth 3 (three) times daily.     nitroGLYCERIN (NITROSTAT) 0.4 MG SL tablet Place 1 tablet (0.4 mg total) under the tongue every 5 (five) minutes as needed. (Patient taking differently: Place 0.4 mg under the tongue every 5 (five) minutes as needed for chest pain.) 25 tablet 6   Polyethylene Glycol 3350 (MIRALAX PO) Take 17 g by mouth daily as needed (constipation).     simvastatin (ZOCOR) 40 MG tablet TAKE 1 TABLET BY MOUTH AT  BEDTIME 90 tablet 1   sodium fluoride (SODIUM FLUORIDE 5000 PPM) 1.1 % GEL dental gel Place 1 pea-size drop into each tooth space of fluoride trays once a day at bedtime.  Leave trays in for 5 minutes and then remove.  Spit out excess fluoride, but DO NOT rinse with water, eat or drink for at least 30 minutes after use. 120 mL 11   traZODone (  DESYREL) 50 MG tablet TAKE 1 TABLET BY MOUTH AT  BEDTIME 90 tablet 3   No current facility-administered medications on file prior to visit.   Past Medical History:  Diagnosis Date   Aortic atherosclerosis ([place])    CAD (coronary artery disease)    Mild non-obstructive disease by cath January 2011. Coronary CTA showed  mild (25-49%) plaque in the RCA and minimal (<25%) coronary CTA 04/2021 with coronary Ca score 257   Carotid artery disease (HCC)    Followed by Dr Kellie Simmering   GERD (gastroesophageal reflux disease)    Hyperlipidemia    Hypertension    Hypothyroidism    Past Surgical History:  Procedure  Laterality Date   APPENDECTOMY     IR IMAGING GUIDED PORT INSERTION  03/02/2021    Family History  Problem Relation Age of Onset   Cancer Mother        esophageal   Heart disease Father    Coronary artery disease Father    Hyperlipidemia Sister    Hyperlipidemia Sister    Hyperlipidemia Brother    Hyperlipidemia Brother    Hyperlipidemia Brother    Coronary artery disease Maternal Grandmother    Social History   Socioeconomic History   Marital status: Married    Spouse name: Not on file   Number of children: 1   Years of education: Not on file   Highest education level: Not on file  Occupational History   Occupation: Field seismologist: [employer]  Tobacco Use   Smoking status: Former    Packs/day: 1.00    Years: 40.00    Total pack years: 40.00    Types: Cigarettes    Quit date: 05/11/2009    Years since quitting: 12.6   Smokeless tobacco: Never  Substance and Sexual Activity   Alcohol use: Not Currently   Drug use: No   Sexual activity: Not Currently  Other Topics Concern   Not on file  Social History Narrative   Not on file   Social Determinants of Health   Financial Resource Strain: Low Risk  (06/22/2021)   Overall Financial Resource Strain (CARDIA)    Difficulty of Paying Living Expenses: Not hard at all  Food Insecurity: No Food Insecurity (02/25/2021)   Hunger Vital Sign    Worried About Running Out of Food in the Last Year: Never true    Badger in the Last Year: Never true  Transportation Needs: No Transportation Needs (06/22/2021)   PRAPARE - Hydrologist (Medical): No    Lack of Transportation (Non-Medical): No  Physical Activity: Inactive (05/25/2021)   Exercise Vital Sign    Days of Exercise per Week: 0 days    Minutes of Exercise per Session: 0 min  Stress: No Stress Concern Present (02/25/2021)   [place]    Feeling of Stress : Only  a little  Social Connections: Socially Integrated (02/25/2021)   Social Connection and Isolation Panel [NHANES]    Frequency of Communication with Friends and Family: More than three times a week    Frequency of Social Gatherings with Friends and Family: More than three times a week    Attends Religious Services: More than 4 times per year    Active Member of [employer] or Organizations: Yes    Attends Music therapist: More than 4 times per year    Marital Status: Married   PHYSICAL EXAM:  VS: BP 124/64   Pulse 67   Resp 16   Ht '5\' 5"'$  (1.651 m)   Wt 135 lb (61.2 kg)   SpO2 96%   BMI 22.47 kg/m   GEN: Well nourished, well developed, in no acute distress  Oropharynx - fibular flap noted  Cardiac: RRR; no murmurs, rubs, or gallops,no edema -  Respiratory:  normal respiratory rate and pattern with no distress - normal breath sounds with no rales, rhonchi, wheezes or rubs  Skin: warm and dry, no rash   Psych: euthymic mood, appropriate affect and demeanor   Objective:    Diabetic Foot Exam - Simple   No data filed      Lab Results  Component Value Date   WBC 4.6 12/17/2021   HGB 11.2 12/17/2021   HCT 35.5 12/17/2021   PLT 238 12/17/2021   GLUCOSE 96 12/17/2021   CHOL 137 12/17/2021   TRIG 69 12/17/2021   HDL 66 12/17/2021   LDLCALC 57 12/17/2021   ALT 11 12/17/2021   AST 21 12/17/2021   NA 144 12/17/2021   K 5.0 12/17/2021   CL 108 (H) 12/17/2021   CREATININE 0.89 12/17/2021   BUN 15 12/17/2021   CO2 23 12/17/2021   TSH 0.191 (L) 12/17/2021   INR 0.99 07/16/2009   HGBA1C  07/15/2009    5.1 (NOTE) The ADA recommends the following therapeutic goal for glycemic control related to Hgb A1c measurement: Goal of therapy: <6.5 Hgb A1c  Reference: American Diabetes Association: Clinical Practice Recommendations 2010, Diabetes Care, 2010, 33: (Suppl  1).      Assessment & Plan:   1. Acquired hypothyroidism - TSH Continue meds as directed 2. Mixed  hyperlipidemia Continue meds and watch diet Labwork pending 3. Hypertension, essential Continue meds Labwork pending 4. Anxiety Continue meds 5. 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[place]) Continue current follow up with specialists 6. Emphysema Rx for flovent as directed Meds ordered this encounter  Medications   fluticasone (FLOVENT HFA) 110 MCG/ACT inhaler    Sig: Inhale 2 puffs into the lungs 2 (two) times daily.    Dispense:  1 each    Refill:  12    Order Specific Question:   Supervising Provider    Answer:   Shelton Silvas   DISCONTD: diltiazem (CARDIZEM CD) 300 MG 24 hr capsule    Sig: Take 1 capsule (300 mg total) by mouth daily.    Dispense:  90 capsule    Refill:  1    Order Specific Question:   Supervising Provider    Answer:   Shelton Silvas   diltiazem (CARDIZEM CD) 240 MG 24 hr capsule    Sig: Take 1 capsule (240 mg total) by mouth daily.    Dispense:  90 capsule    Refill:  1    Order Specific Question:   Supervising Provider    AnswerShelton Silvas      Orders Placed This Encounter  Procedures   CBC with Differential/Platelet   Comprehensive metabolic panel   TSH   Lipid panel   VITAMIN D 25 Hydroxy (Vit-D Deficiency, Fractures)   Cardiovascular Risk Assessment       Follow-up: Return in about 6 months (around 06/17/2022) for chronic fasting follow up --- tomorrow for fasting labs - 1 month for nurse visit bp check.  An After Visit Summary was printed and given to the patient.  Yetta Flock [facility] [phone]

## 2021-12-23 ENCOUNTER — Other Ambulatory Visit: Payer: Self-pay

## 2021-12-23 DIAGNOSIS — J432 Centrilobular emphysema: Secondary | ICD-10-CM

## 2021-12-23 MED ORDER — FLUTICASONE PROPIONATE HFA 110 MCG/ACT IN AERO: 2.0000 | INHALATION_SPRAY | Freq: Two times a day (BID) | RESPIRATORY_TRACT | 12 refills | Status: DC

## 2021-12-23 NOTE — Telephone Encounter (Signed)
Sent to incorrect pharmacy originally. Resent to [facility].  Royce Macadamia, [place] 12/23/21 11:04 AM

## 2021-12-24 ENCOUNTER — Other Ambulatory Visit: Payer: Self-pay

## 2021-12-24 DIAGNOSIS — J432 Centrilobular emphysema: Secondary | ICD-10-CM

## 2021-12-24 MED ORDER — FLUTICASONE PROPIONATE HFA 110 MCG/ACT IN AERO: 2.0000 | INHALATION_SPRAY | Freq: Two times a day (BID) | RESPIRATORY_TRACT | 3 refills | Status: DC

## 2021-12-25 ENCOUNTER — Other Ambulatory Visit: Payer: Self-pay | Admitting: Physician Assistant

## 2021-12-25 DIAGNOSIS — I10 Essential (primary) hypertension: Secondary | ICD-10-CM

## 2021-12-25 DIAGNOSIS — E782 Mixed hyperlipidemia: Secondary | ICD-10-CM

## 2021-12-27 ENCOUNTER — Inpatient Hospital Stay: Payer: Medicare Other | Attending: Hematology and Oncology

## 2021-12-27 ENCOUNTER — Other Ambulatory Visit: Payer: Self-pay | Admitting: Physician Assistant

## 2021-12-27 ENCOUNTER — Other Ambulatory Visit: Payer: Self-pay

## 2021-12-27 DIAGNOSIS — E039 Hypothyroidism, unspecified: Secondary | ICD-10-CM | POA: Insufficient documentation

## 2021-12-27 DIAGNOSIS — Z87891 Personal history of nicotine dependence: Secondary | ICD-10-CM | POA: Diagnosis not present

## 2021-12-27 DIAGNOSIS — C060 Malignant neoplasm of cheek mucosa: Secondary | ICD-10-CM | POA: Insufficient documentation

## 2021-12-27 DIAGNOSIS — I890 Lymphedema, not elsewhere classified: Secondary | ICD-10-CM | POA: Insufficient documentation

## 2021-12-27 DIAGNOSIS — T451X5D Adverse effect of antineoplastic and immunosuppressive drugs, subsequent encounter: Secondary | ICD-10-CM | POA: Diagnosis not present

## 2021-12-27 DIAGNOSIS — K1231 Oral mucositis (ulcerative) due to antineoplastic therapy: Secondary | ICD-10-CM | POA: Diagnosis not present

## 2021-12-27 DIAGNOSIS — E038 Other specified hypothyroidism: Secondary | ICD-10-CM

## 2021-12-27 DIAGNOSIS — Z95828 Presence of other vascular implants and grafts: Secondary | ICD-10-CM

## 2021-12-27 DIAGNOSIS — D649 Anemia, unspecified: Secondary | ICD-10-CM | POA: Insufficient documentation

## 2021-12-27 MED ORDER — SODIUM CHLORIDE 0.9% FLUSH
10.0000 mL | Freq: Once | INTRAVENOUS | Status: AC
  Administered 2021-12-27: 10 mL

## 2021-12-27 MED ORDER — LEVOTHYROXINE SODIUM 100 MCG PO TABS: 100.0000 ug | ORAL_TABLET | Freq: Every day | ORAL | 1 refills | Status: DC

## 2021-12-27 MED ORDER — HEPARIN SOD (PORK) LOCK FLUSH 100 UNIT/ML IV SOLN
500.0000 [IU] | Freq: Once | INTRAVENOUS | Status: AC
  Administered 2021-12-27: 500 [IU]

## 2021-12-28 DIAGNOSIS — S90561A Insect bite (nonvenomous), right ankle, initial encounter: Secondary | ICD-10-CM | POA: Diagnosis not present

## 2022-01-19 ENCOUNTER — Other Ambulatory Visit: Payer: Self-pay | Admitting: Physician Assistant

## 2022-01-19 ENCOUNTER — Ambulatory Visit (INDEPENDENT_AMBULATORY_CARE_PROVIDER_SITE_OTHER): Payer: Medicare Other | Admitting: Physician Assistant

## 2022-01-19 ENCOUNTER — Ambulatory Visit
Admission: RE | Admit: 2022-01-19 | Discharge: 2022-01-19 | Disposition: A | Discharge status: Home / Self Care | Payer: Medicare Other | Source: Ambulatory Visit | Attending: Physician Assistant | Admitting: Physician Assistant

## 2022-01-19 ENCOUNTER — Encounter: Payer: Self-pay | Admitting: Physician Assistant

## 2022-01-19 VITALS — BP 98/60 | HR 60 | Temp 97.4°F | Ht 65.0 in | Wt 131.2 lb

## 2022-01-19 DIAGNOSIS — M316 Other giant cell arteritis: Secondary | ICD-10-CM

## 2022-01-19 DIAGNOSIS — G4452 New daily persistent headache (NDPH): Secondary | ICD-10-CM

## 2022-01-19 DIAGNOSIS — C060 Malignant neoplasm of cheek mucosa: Secondary | ICD-10-CM

## 2022-01-19 DIAGNOSIS — I10 Essential (primary) hypertension: Secondary | ICD-10-CM | POA: Diagnosis not present

## 2022-01-19 DIAGNOSIS — R519 Headache, unspecified: Secondary | ICD-10-CM | POA: Diagnosis not present

## 2022-01-19 LAB — COMPREHENSIVE METABOLIC PANEL
ALT: 11 IU/L (ref 0–32)
AST: 22 IU/L (ref 0–40)
Albumin/Globulin Ratio: 2.2 (ref 1.2–2.2)
Albumin: 4.2 g/dL (ref 3.9–4.9)
Alkaline Phosphatase: 68 IU/L (ref 44–121)
BUN/Creatinine Ratio: 26 (ref 12–28)
BUN: 25 mg/dL (ref 8–27)
Bilirubin Total: <0.2 mg/dL (ref 0.0–1.2)
CO2: 29 mmol/L (ref 20–29)
Calcium: 10 mg/dL (ref 8.7–10.3)
Chloride: 105 mmol/L (ref 96–106)
Creatinine, Ser: 0.97 mg/dL (ref 0.57–1.00)
Globulin, Total: 1.9 g/dL (ref 1.5–4.5)
Glucose: 80 mg/dL (ref 70–99)
Potassium: 5.7 mmol/L — ABNORMAL HIGH (ref 3.5–5.2)
Sodium: 141 mmol/L (ref 134–144)
Total Protein: 6.1 g/dL (ref 6.0–8.5)
eGFR: 64 mL/min/{1.73_m2} (ref >59)

## 2022-01-19 LAB — CBC WITH DIFFERENTIAL/PLATELET
Basophils Absolute: 0 10*3/uL (ref 0.0–0.2)
Basos: 0 %
EOS (ABSOLUTE): 0.1 10*3/uL (ref 0.0–0.4)
Eos: 3 %
Hematocrit: 36.2 % (ref 34.0–46.6)
Hemoglobin: 12 g/dL (ref 11.1–15.9)
Lymphocytes Absolute: 1.2 10*3/uL (ref 0.7–3.1)
Lymphs: 23 %
MCH: 29.9 pg (ref 26.6–33.0)
MCHC: 33.1 g/dL (ref 31.5–35.7)
MCV: 90 fL (ref 79–97)
Monocytes Absolute: 0.6 10*3/uL (ref 0.1–0.9)
Monocytes: 12 %
Neutrophils Absolute: 3.2 10*3/uL (ref 1.4–7.0)
Neutrophils: 62 %
Platelets: 209 10*3/uL (ref 150–450)
RBC: 4.01 x10E6/uL (ref 3.77–5.28)
RDW: 13.9 % (ref 11.7–15.4)
WBC: 5.2 10*3/uL (ref 3.4–10.8)

## 2022-01-19 LAB — SEDIMENTATION RATE: Sed Rate: 79 mm/hr — ABNORMAL HIGH (ref 0–40)

## 2022-01-19 MED ORDER — DILTIAZEM HCL ER COATED BEADS 120 MG PO CP24: 120.0000 mg | ORAL_CAPSULE | Freq: Every day | ORAL | 1 refills | Status: DC

## 2022-01-19 MED ORDER — PREDNISONE 20 MG PO TABS: ORAL_TABLET | ORAL | 0 refills | Status: DC

## 2022-01-19 NOTE — Addendum Note (Signed)
Addended byMarge Duncans on: 01/19/2022 09:15 AM   Modules accepted: Orders

## 2022-01-19 NOTE — Progress Notes (Signed)
Acute Office Visit  Subjective:    Patient ID: Tammy Brady, female    DOB: 10/15/1950, 71 y.o.   MRN: [id number]  Chief Complaint  Patient presents with   Headache    HPI: Patient is in today for complaints of headache on left side of head in temple area and above left ear.  States she has had it for about 4 days - describes as sharp stabbing type pains.  At times she also has eye symptoms of seeing 'squiggles' She denies nausea or vomiting - denies vision loss or weakness She states she has had a history of migraines in the past but this headache is different Of note pt with history of SCC or oral mucosa with extensive resection in past year of mouth and jaw  Pt with history of hypertension - she has lost a bit of weight (she believes due to fact she is not able to eat well with the mouth flap revision) and we have decreased her cardizem from 360 to 240 - she states bp still running low and at times feels dizzy when she stands up Recommend to go ahead and decrease medication again to 178m  Pt has lost 4 more pounds since last visit - she states she has been feeling well and actually has now been working out.  Appetite slightly improved since last visit  Past Medical History:  Diagnosis Date   [place]    CAD (coronary artery disease)    Mild non-obstructive disease by cath January 2011. Coronary CTA showed  mild (25-49%) plaque in the RCA and minimal (<25%) coronary CTA 04/2021 with coronary Ca score 257   Carotid artery disease ([facility])    Followed by Dr LKellie Simmering  GERD (gastroesophageal reflux disease)    Hyperlipidemia    Hypertension    Hypothyroidism     Past Surgical History:  Procedure Laterality Date   APPENDECTOMY     IR IMAGING GUIDED PORT INSERTION  03/02/2021    Family History  Problem Relation Age of Onset   Cancer Mother        esophageal   Heart disease Father    Coronary artery disease Father    Hyperlipidemia Sister    Hyperlipidemia  Sister    Hyperlipidemia Brother    Hyperlipidemia Brother    Hyperlipidemia Brother    Coronary artery disease Maternal Grandmother     Social History   Socioeconomic History   Marital status: Married    Spouse name: Not on file   Number of children: 1   Years of education: Not on file   Highest education level: Not on file  Occupational History   Occupation: CField seismologist [employer] Tobacco Use   Smoking status: Former    Packs/day: 1.00    Years: 40.00    Total pack years: 40.00    Types: Cigarettes    Quit date: 05/11/2009    Years since quitting: 12.7   Smokeless tobacco: Never  Substance and Sexual Activity   Alcohol use: Not Currently   Drug use: No   Sexual activity: Not Currently  Other Topics Concern   Not on file  Social History Narrative   Not on file   Social Determinants of Health   Financial Resource Strain: Low Risk  (06/22/2021)   Overall Financial Resource Strain (CARDIA)    Difficulty of Paying Living Expenses: Not hard at all  Food Insecurity: No Food Insecurity (02/25/2021)   Hunger  Vital Sign    Worried About Charity fundraiser in the Last Year: Never true    Ran Out of Food in the Last Year: Never true  Transportation Needs: No Transportation Needs (06/22/2021)   PRAPARE - Hydrologist (Medical): No    Lack of Transportation (Non-Medical): No  Physical Activity: Inactive (05/25/2021)   Exercise Vital Sign    Days of Exercise per Week: 0 days    Minutes of Exercise per Session: 0 min  Stress: No Stress Concern Present (02/25/2021)   [place]    Feeling of Stress : Only a little  Social Connections: Socially Integrated (02/25/2021)   Social Connection and Isolation Panel [NHANES]    Frequency of Communication with Friends and Family: More than three times a week    Frequency of Social Gatherings with Friends and Family: More than  three times a week    Attends Religious Services: More than 4 times per year    Active Member of [employer] or Organizations: Yes    Attends Music therapist: More than 4 times per year    Marital Status: Married  Human resources officer Violence: Not At Risk (05/25/2021)   Humiliation, Afraid, Rape, and Kick questionnaire    Fear of Current or Ex-Partner: No    Emotionally Abused: No    Physically Abused: No    Sexually Abused: No    Outpatient Medications Prior to Visit  Medication Sig Dispense Refill   acetaminophen (TYLENOL) 500 MG tablet Take 500 mg by mouth every 6 (six) hours as needed for mild pain.     aspirin EC 81 MG tablet Take 1 tablet (81 mg total) by mouth daily. Swallow whole. 90 tablet 3   calcium carbonate (OS-CAL) 600 MG TABS Take 600 mg by mouth daily.     fluticasone (FLOVENT HFA) 110 MCG/ACT inhaler Inhale 2 puffs into the lungs 2 (two) times daily. 3 each 3   gabapentin (NEURONTIN) 300 MG capsule Take 300 mg by mouth 3 (three) times daily.     levothyroxine (SYNTHROID) 100 MCG tablet Take 1 tablet (100 mcg total) by mouth daily. 90 tablet 1   nitroGLYCERIN (NITROSTAT) 0.4 MG SL tablet Place 1 tablet (0.4 mg total) under the tongue every 5 (five) minutes as needed. (Patient taking differently: Place 0.4 mg under the tongue every 5 (five) minutes as needed for chest pain.) 25 tablet 6   Polyethylene Glycol 3350 (MIRALAX PO) Take 17 g by mouth daily as needed (constipation).     simvastatin (ZOCOR) 40 MG tablet TAKE 1 TABLET BY MOUTH AT  BEDTIME 90 tablet 3   sodium fluoride (SODIUM FLUORIDE 5000 PPM) 1.1 % GEL dental gel Place 1 pea-size drop into each tooth space of fluoride trays once a day at bedtime.  Leave trays in for 5 minutes and then remove.  Spit out excess fluoride, but DO NOT rinse with water, eat or drink for at least 30 minutes after use. 120 mL 11   traZODone (DESYREL) 50 MG tablet TAKE 1 TABLET BY MOUTH AT  BEDTIME 90 tablet 3   diltiazem (CARDIZEM CD)  240 MG 24 hr capsule Take 1 capsule (240 mg total) by mouth daily. 90 capsule 1   No facility-administered medications prior to visit.    Allergies  Allergen Reactions   Codeine Other (See Comments)    Headache  HEADACHE   Oxycodone     Caused headaches.  Review of Systems CONSTITUTIONAL: see HPI E/N/T: Negative for ear pain, nasal congestion and sore throat.  CARDIOVASCULAR: see HPI RESPIRATORY: Negative for recent cough and dyspnea.  GASTROINTESTINAL: Negative for abdominal pain, acid reflux symptoms, constipation, diarrhea, nausea and vomiting.   NEUROLOGICAL: see HPI PSYCHIATRIC: Negative for sleep disturbance and to question depression screen.  Negative for depression, negative for anhedonia.         Objective:  PHYSICAL EXAM:   VS: BP 98/60 (BP Location: Left Arm, Patient Position: Sitting, Cuff Size: Small)   Pulse 60   Temp (!) 97.4 F (36.3 C) (Temporal)   Ht _0  (1.651 m)   Wt 131 lb 3.2 oz (59.5 kg)   SpO2 97%   BMI 21.83 kg/m   GEN: Well nourished, well developed, in no acute distress  Oropharynx - no erythema - flap noted Neck: no JVD or masses - no thyromegaly Cardiac: RRR; no murmurs,  Respiratory:  normal respiratory rate and pattern with no distress - normal breath sounds with no rales, rhonchi, wheezes or rubs  Skin: warm and dry, no rash  Neuro:  Alert and Oriented x 3, Strength and sensation are intact - CN II-Xii grossly intact Psych: euthymic mood, appropriate affect and demeanor   Lab Results  Component Value Date   TSH 0.191 (L) 12/17/2021   Lab Results  Component Value Date   WBC 4.6 12/17/2021   HGB 11.2 12/17/2021   HCT 35.5 12/17/2021   MCV 95 12/17/2021   PLT 238 12/17/2021   Lab Results  Component Value Date   NA 144 12/17/2021   K 5.0 12/17/2021   CO2 23 12/17/2021   GLUCOSE 96 12/17/2021   BUN 15 12/17/2021   CREATININE 0.89 12/17/2021   BILITOT 0.2 12/17/2021   ALKPHOS 60 12/17/2021   AST 21 12/17/2021    ALT 11 12/17/2021   PROT 5.9 (L) 12/17/2021   ALBUMIN 4.1 12/17/2021   CALCIUM 9.4 12/17/2021   ANIONGAP 8 07/19/2021   EGFR 71 12/17/2021   Lab Results  Component Value Date   CHOL 137 12/17/2021   Lab Results  Component Value Date   HDL 66 12/17/2021   Lab Results  Component Value Date   LDLCALC 57 12/17/2021   Lab Results  Component Value Date   TRIG 69 12/17/2021   Lab Results  Component Value Date   CHOLHDL 2.1 12/17/2021   Lab Results  Component Value Date   HGBA1C  07/15/2009    5.1 (NOTE) The ADA recommends the following therapeutic goal for glycemic control related to Hgb A1c measurement: Goal of therapy: <6.5 Hgb A1c  Reference: American Diabetes Association: Clinical Practice Recommendations 2010, Diabetes Care, 2010, 33: (Suppl  1).       Assessment & Plan:   Problem List Items Addressed This Visit       Cardiovascular and Mediastinum   Hypertension, essential   Relevant Medications   diltiazem (CARDIZEM CD) 120 MG 24 hr capsule     Digestive   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HCC)   Relevant Orders   CT HEAD WO CONTRAST (5MM)     Other   New daily persistent headache - Primary   Relevant Medications      Other Relevant Orders   CBC with Differential/Platelet   Comprehensive metabolic panel   Sedimentation Rate   CT HEAD WO CONTRAST (5MM)   Meds ordered this encounter  Medications   diltiazem (CARDIZEM CD) 120 MG 24 hr capsule  Sig: Take 1 capsule (120 mg total) by mouth daily.    Dispense:  30 capsule    Refill:  1    Order Specific Question:   Supervising Provider    AnswerShelton Silvas    Orders Placed This Encounter  Procedures   CT HEAD WO CONTRAST (5MM)   CBC with Differential/Platelet   Comprehensive metabolic panel   Sedimentation Rate     Follow-up: Return in about 2 weeks (around 02/02/2022) for follow up. Will treat according to CT results An After Visit Summary was printed and given to the  patient.  Yetta Flock [facility] [phone]

## 2022-01-20 NOTE — Progress Notes (Signed)
Patient made aware of results, who like to see a general surgeon here in [place].

## 2022-01-21 ENCOUNTER — Telehealth: Payer: Self-pay

## 2022-01-21 NOTE — Telephone Encounter (Signed)
Patient wanted Gay Filler aware she is having biopsy next Friday 7/21. Patient stated she would like Gay Filler to result the biopsy and inform patient of results.  Royce Macadamia, [place] 01/21/22 10:28 AM

## 2022-01-24 ENCOUNTER — Ambulatory Visit (INDEPENDENT_AMBULATORY_CARE_PROVIDER_SITE_OTHER): Payer: Medicare Other | Admitting: Physician Assistant

## 2022-01-24 ENCOUNTER — Encounter: Payer: Self-pay | Admitting: Physician Assistant

## 2022-01-24 ENCOUNTER — Other Ambulatory Visit: Payer: Self-pay | Admitting: Physician Assistant

## 2022-01-24 ENCOUNTER — Telehealth: Payer: Self-pay

## 2022-01-24 VITALS — BP 158/82 | HR 60 | Temp 96.9°F | Ht 65.0 in | Wt 132.0 lb

## 2022-01-24 DIAGNOSIS — I10 Essential (primary) hypertension: Secondary | ICD-10-CM

## 2022-01-24 DIAGNOSIS — R072 Precordial pain: Secondary | ICD-10-CM | POA: Diagnosis not present

## 2022-01-24 DIAGNOSIS — I2510 Atherosclerotic heart disease of native coronary artery without angina pectoris: Secondary | ICD-10-CM

## 2022-01-24 LAB — CBC WITH DIFFERENTIAL/PLATELET
Basophils Absolute: 0 10*3/uL (ref 0.0–0.2)
Basos: 0 %
EOS (ABSOLUTE): 0 10*3/uL (ref 0.0–0.4)
Eos: 0 %
Hematocrit: 37.5 % (ref 34.0–46.6)
Hemoglobin: 12.7 g/dL (ref 11.1–15.9)
Lymphocytes Absolute: 0.7 10*3/uL (ref 0.7–3.1)
Lymphs: 7 %
MCH: 30.5 pg (ref 26.6–33.0)
MCHC: 33.9 g/dL (ref 31.5–35.7)
MCV: 90 fL (ref 79–97)
Monocytes Absolute: 0.2 10*3/uL (ref 0.1–0.9)
Monocytes: 3 %
Neutrophils Absolute: 8.9 10*3/uL — ABNORMAL HIGH (ref 1.4–7.0)
Neutrophils: 90 %
Platelets: 248 10*3/uL (ref 150–450)
RBC: 4.16 x10E6/uL (ref 3.77–5.28)
RDW: 14.3 % (ref 11.7–15.4)
WBC: 9.8 10*3/uL (ref 3.4–10.8)

## 2022-01-24 LAB — COMPREHENSIVE METABOLIC PANEL
ALT: 14 IU/L (ref 0–32)
AST: 21 IU/L (ref 0–40)
Albumin/Globulin Ratio: 2.2 (ref 1.2–2.2)
Albumin: 4.6 g/dL (ref 3.9–4.9)
Alkaline Phosphatase: 77 IU/L (ref 44–121)
BUN/Creatinine Ratio: 39 — ABNORMAL HIGH (ref 12–28)
BUN: 26 mg/dL (ref 8–27)
Bilirubin Total: 0.2 mg/dL (ref 0.0–1.2)
CO2: 26 mmol/L (ref 20–29)
Calcium: 9.5 mg/dL (ref 8.7–10.3)
Chloride: 101 mmol/L (ref 96–106)
Creatinine, Ser: 0.67 mg/dL (ref 0.57–1.00)
Globulin, Total: 2.1 g/dL (ref 1.5–4.5)
Glucose: 133 mg/dL — ABNORMAL HIGH (ref 70–99)
Potassium: 4.5 mmol/L (ref 3.5–5.2)
Sodium: 137 mmol/L (ref 134–144)
Total Protein: 6.7 g/dL (ref 6.0–8.5)
eGFR: 95 mL/min/{1.73_m2} (ref >59)

## 2022-01-24 LAB — TROPONIN T: Troponin T (Highly Sensitive): 11 ng/L (ref 0–14)

## 2022-01-24 MED ORDER — NITROGLYCERIN 0.4 MG SL SUBL: 0.4000 mg | SUBLINGUAL_TABLET | SUBLINGUAL | 6 refills | Status: AC | PRN

## 2022-01-24 MED ORDER — DILTIAZEM HCL ER COATED BEADS 120 MG PO CP24: ORAL_CAPSULE | ORAL | 1 refills | Status: DC

## 2022-01-24 NOTE — Telephone Encounter (Signed)
Patient called stated her blood pressure has been high since she as been on the prednisone, also stated you cut her Cardizem medication, wanted to know should she go back to taking the 300 mg.  Patient stated she is also having chest pain, I stated she should come in to be seen or to er patient denied

## 2022-01-24 NOTE — Progress Notes (Signed)
Subjective:  Patient ID: Tammy Brady, female    DOB: 1951/03/20  Age: 71 y.o. MRN: [id number]  Chief Complaint  Patient presents with   Chest Pain   Hypertension    HPI  Pt with history of hypertension and at last office visit we decreased dose of cardizem from '240mg'$  to '120mg'$  because she had been having low readings at home.  However she is now taking prednisone for a separate issue and thinks this could be causing her bp to elevate.  She has been taking at home and ranging 160-170s/80s-90s She states that she did have some chest heaviness early this morning that lasted about 10 min - states when she sat up out of bed it did improve. Says may have been more heartburn like in nature but does give history of having similar episodes in past.  She had a cardiology workup in past but that has been more than 10 years ago  Of note she is currently being treated with high dose prednisone for possible temporal arteritis - will be having biopsy on Friday Current Outpatient Medications on File Prior to Visit  Medication Sig Dispense Refill   acetaminophen (TYLENOL) 500 MG tablet Take 500 mg by mouth every 6 (six) hours as needed for mild pain.     aspirin EC 81 MG tablet Take 1 tablet (81 mg total) by mouth daily. Swallow whole. 90 tablet 3   calcium carbonate (OS-CAL) 600 MG TABS Take 600 mg by mouth daily.     fluticasone (FLOVENT HFA) 110 MCG/ACT inhaler Inhale 2 puffs into the lungs 2 (two) times daily. 3 each 3   gabapentin (NEURONTIN) 300 MG capsule Take 300 mg by mouth 3 (three) times daily.     levothyroxine (SYNTHROID) 100 MCG tablet Take 1 tablet (100 mcg total) by mouth daily. 90 tablet 1   predniSONE (DELTASONE) 20 MG tablet 3 po qd ('60mg'$  daily) 90 tablet 0   simvastatin (ZOCOR) 40 MG tablet TAKE 1 TABLET BY MOUTH AT  BEDTIME 90 tablet 3   sodium fluoride (SODIUM FLUORIDE 5000 PPM) 1.1 % GEL dental gel Place 1 pea-size drop into each tooth space of fluoride trays once a day at bedtime.   Leave trays in for 5 minutes and then remove.  Spit out excess fluoride, but DO NOT rinse with water, eat or drink for at least 30 minutes after use. 120 mL 11   traZODone (DESYREL) 50 MG tablet TAKE 1 TABLET BY MOUTH AT  BEDTIME 90 tablet 3   No current facility-administered medications on file prior to visit.   Past Medical History:  Diagnosis Date   Aortic atherosclerosis ([place])    CAD (coronary artery disease)    Mild non-obstructive disease by cath January 2011. Coronary CTA showed  mild (25-49%) plaque in the RCA and minimal (<25%) coronary CTA 04/2021 with coronary Ca score 257   Carotid artery disease (HCC)    Followed by Dr Kellie Simmering   GERD (gastroesophageal reflux disease)    Hyperlipidemia    Hypertension    Hypothyroidism    Past Surgical History:  Procedure Laterality Date   APPENDECTOMY     IR IMAGING GUIDED PORT INSERTION  03/02/2021    Family History  Problem Relation Age of Onset   Cancer Mother        esophageal   Heart disease Father    Coronary artery disease Father    Hyperlipidemia Sister    Hyperlipidemia Sister    Hyperlipidemia Brother  Hyperlipidemia Brother    Hyperlipidemia Brother    Coronary artery disease Maternal Grandmother    Social History   Socioeconomic History   Marital status: Married    Spouse name: Not on file   Number of children: 1   Years of education: Not on file   Highest education level: Not on file  Occupational History   Occupation: Field seismologist: [employer]  Tobacco Use   Smoking status: Former    Packs/day: 1.00    Years: 40.00    Total pack years: 40.00    Types: Cigarettes    Quit date: 05/11/2009    Years since quitting: 12.7   Smokeless tobacco: Never  Substance and Sexual Activity   Alcohol use: Not Currently   Drug use: No   Sexual activity: Not Currently  Other Topics Concern   Not on file  Social History Narrative   Not on file   Social Determinants of Health   Financial Resource  Strain: Low Risk  (06/22/2021)   Overall Financial Resource Strain (CARDIA)    Difficulty of Paying Living Expenses: Not hard at all  Food Insecurity: No Food Insecurity (02/25/2021)   Hunger Vital Sign    Worried About Running Out of Food in the Last Year: Never true    [place] in the Last Year: Never true  Transportation Needs: No Transportation Needs (06/22/2021)   PRAPARE - Hydrologist (Medical): No    Lack of Transportation (Non-Medical): No  Physical Activity: Inactive (05/25/2021)   Exercise Vital Sign    Days of Exercise per Week: 0 days    Minutes of Exercise per Session: 0 min  Stress: No Stress Concern Present (02/25/2021)   [place]    Feeling of Stress : Only a little  Social Connections: Socially Integrated (02/25/2021)   Social Connection and Isolation Panel [NHANES]    Frequency of Communication with Friends and Family: More than three times a week    Frequency of Social Gatherings with Friends and Family: More than three times a week    Attends Religious Services: More than 4 times per year    Active Member of Clubs or Organizations: Yes    Attends Music therapist: More than 4 times per year    Marital Status: Married    Review of Systems CONSTITUTIONAL: Negative for chills, fatigue, fever, unintentional weight gain and unintentional weight loss.  E/N/T: Negative for ear pain, nasal congestion and sore throat.  CARDIOVASCULAR: see HPI RESPIRATORY: Negative for recent cough and dyspnea.  GASTROINTESTINAL: Negative for abdominal pain, acid reflux symptoms, constipation, diarrhea, nausea and vomiting.  MSK: Negative for arthralgias and myalgias.  INTEGUMENTARY: Negative for rash.  NEUROLOGICAL: see HPI       Objective:  PHYSICAL EXAM:   VS: BP (!) 158/82 (BP Location: Left Arm, Patient Position: Sitting, Cuff Size: Normal)   Pulse 60   Temp  (!) 96.9 F (36.1 C) (Temporal)   Ht '5\' 5"'$  (1.651 m)   Wt 132 lb (59.9 kg)   SpO2 96%   BMI 21.97 kg/m   GEN: Well nourished, well developed, in no acute distress   Cardiac: RRR; no murmurs, rubs, or gallops,no edema -  Respiratory:  normal respiratory rate and pattern with no distress - normal breath sounds with no rales, rhonchi, wheezes or rubs GI: normal bowel sounds, no masses or tenderness  Psych: euthymic mood,  appropriate affect and demeanor  EKG - sinus bradycardia otherwise normal Lab Results  Component Value Date   WBC 5.2 01/19/2022   HGB 12.0 01/19/2022   HCT 36.2 01/19/2022   PLT 209 01/19/2022   GLUCOSE 80 01/19/2022   CHOL 137 12/17/2021   TRIG 69 12/17/2021   HDL 66 12/17/2021   LDLCALC 57 12/17/2021   ALT 11 01/19/2022   AST 22 01/19/2022   NA 141 01/19/2022   K 5.7 (H) 01/19/2022   CL 105 01/19/2022   CREATININE 0.97 01/19/2022   BUN 25 01/19/2022   CO2 29 01/19/2022   TSH 0.191 (L) 12/17/2021   INR 0.99 07/16/2009   HGBA1C  07/15/2009    5.1 (NOTE) The ADA recommends the following therapeutic goal for glycemic control related to Hgb A1c measurement: Goal of therapy: <6.5 Hgb A1c  Reference: American Diabetes Association: Clinical Practice Recommendations 2010, Diabetes Care, 2010, 33: (Suppl  1).      Assessment & Plan:   Problem List Items Addressed This Visit       Cardiovascular and Mediastinum   Hypertension, essential   Relevant Medications   nitroGLYCERIN (NITROSTAT) 0.4 MG SL tablet   diltiazem (CARDIZEM CD) 120 MG 24 hr capsule - increase to 2 capsules qd   CAD, NATIVE VESSEL   Relevant Medications   nitroGLYCERIN (NITROSTAT) 0.4 MG SL tablet   diltiazem (CARDIZEM CD) 120 MG 24 hr capsule   Other Visit Diagnoses     Precordial pain    -  Primary   Relevant Orders   EKG 12-Lead   CBC with Differential/Platelet   Comprehensive metabolic panel   Troponin T Will refer to cardiology for further evaluation if troponin negative      .  Meds ordered this encounter  Medications   nitroGLYCERIN (NITROSTAT) 0.4 MG SL tablet    Sig: Place 1 tablet (0.4 mg total) under the tongue every 5 (five) minutes as needed.    Dispense:  25 tablet    Refill:  6    Order Specific Question:   Supervising Provider    Answer:   Shelton Silvas   diltiazem (CARDIZEM CD) 120 MG 24 hr capsule    Sig: 2 po qd    Dispense:  30 capsule    Refill:  1    Order Specific Question:   Supervising Provider    AnswerShelton Silvas    Orders Placed This Encounter  Procedures   CBC with Differential/Platelet   Comprehensive metabolic panel   Troponin T   EKG 12-Lead     Follow-up: Return as scheduled in a few weeks.  An After Visit Summary was printed and given to the patient.  Yetta Flock [facility] [phone]

## 2022-01-24 NOTE — Telephone Encounter (Signed)
I spoke with patient and she agreed to see me this afternoon

## 2022-01-24 NOTE — Progress Notes (Signed)
Tammy Brady presents today for follow-up after completing radiation to her mandible on 04/19/2021  Pain issues, if any: Left sided temporal headaches. Scheduled for left temporal artery biopsy this Friday Using a feeding tube?: N/A Weight changes, if any:  Wt Readings from Last 3 Encounters:  01/25/22 130 lb 12.8 oz (59.3 kg)  01/24/22 132 lb (59.9 kg)  01/19/22 131 lb 3.2 oz (59.5 kg)   Swallowing issues, if any: More related to difficulty chewing (still struggles with solid food); but tolerates liquids and soft foods well. Last saw Bridgett Larsson on 09/08/2021: "At this time pt cont to exhibit at least cont'd oral stage dysphagia due mainly to surgical changes with mandible. Tammy Brady's swallowing is deemed WNL/WFL with water and puree although pt could likely tolerate dys II and III items as well. She reports eating a wider variety of foods from dys I-III. She has not used PEG in two weeks, and has maintained weight about 147-148lbs for 7 days." Smoking or chewing tobacco? None Using fluoride trays daily? Yes; 11/10/2021 Saw Dr. Nonie Hoyer On Lim [facility], [facility])  --Periodontal Will re-assess #30 at next prophy for possible need for ScRP Loss of restoration, likely onlay Discussed leaving the tooth as is as the tooth is cleansable and patient has been keeping it clean. No symptoms.  Patient, however, would like occlusion with the opposing tooth as she does not have many teeth to chew with. Will plan for a composite restoration for now.  Given minimal tooth preparation, direct restoration was recommended, but will consider crown if restoration does not hold. --Abfraction and likely loss of restoration #5B Recommended direct restoration Right lateral tongue leukoplakic lesion  Consistent with hyperkeratosis s/s irritation against the teeth as the tongue is pushed to the right by the flap.  Expect to resolve after debulking surgery. Left dorsolateral tongue irritation Either secondary to  irritation due to contact against upper teeth as the tongue is pushed up by the flap vs candidiasis.  Will try nystatin rinse for now. --Missing teeth Patient would like to replace missing teeth. Discussed with her that the currently remaining mandibular teeth are not good abutment candidates for an RPD. Implant may be possibility but will need a consultation with a prosthodontist to explore restorative options first.  Patient understands that based on the ridge shape and bone quality, she may not be a candidate for prosthesis.  Plan to refer to prosthodontics after debulking surgery.  --Periodontal Will re-assess #30 at next prophy for possible need for ScRP Loss of restoration, likely onlay Discussed leaving the tooth as is as the tooth is cleansable and patient has been keeping it clean. No symptoms. Patient, however, would like occlusion with the opposing tooth as she does not have many teeth to chew with. Will plan for a composite restoration for now. Given minimal tooth preparation, direct restoration was recommended, but will consider crown if restoration does not hold.   Last ENT visit was on:  12/08/2021 Dr. Tiburcio Pea [facility]) I discussion with the patient and her husband regarding the events surrounding the aborted procedure.  I explained the circumstances, and though disappointed, she is in good spirits and glad to have the pack out.. She is going to consider her options regarding flap debulking as this decision is largely dependent on her ability to get dental rehabilitation. Follow up with me PRN. She will contact me with questions or desire to move forward with debulking. She will continue to follow with Dr. Amada Jupiter for cancer surveillance. They  have my contact information and know to contact me with any questions or concerns  10/13/2021 --Dr. Ileene Rubens  Patient saw Dr. Ihor Austin in the interim and would like to proceed with revision surgery but has not yet been  scheduled.  She is waiting to schedule once her inflammation diminishes and she sees dental.  She presents for routine surveillance.  Overall she is doing very well.  The patient endorses persistent tongue soreness/irritation.  She has had her G-tube removed in the interim. She is following with dental routinely and states she is going to be evaluated for partial dentures.   Other notable issues, if any: Last saw medical oncologist (Dr. Chryl Heck) January 2023, and will follow-up again in January 2024

## 2022-01-25 ENCOUNTER — Ambulatory Visit
Admission: RE | Admit: 2022-01-25 | Discharge: 2022-01-25 | Disposition: A | Discharge status: Home / Self Care | Payer: Medicare Other | Source: Ambulatory Visit | Attending: Radiation Oncology | Admitting: Radiation Oncology

## 2022-01-25 ENCOUNTER — Ambulatory Visit: Payer: Medicare Other

## 2022-01-25 ENCOUNTER — Other Ambulatory Visit: Payer: Self-pay

## 2022-01-25 VITALS — BP 130/65 | HR 57 | Temp 97.5°F | Resp 20 | Wt 130.8 lb

## 2022-01-25 DIAGNOSIS — R519 Headache, unspecified: Secondary | ICD-10-CM | POA: Insufficient documentation

## 2022-01-25 DIAGNOSIS — Z7982 Long term (current) use of aspirin: Secondary | ICD-10-CM | POA: Insufficient documentation

## 2022-01-25 DIAGNOSIS — I890 Lymphedema, not elsewhere classified: Secondary | ICD-10-CM | POA: Insufficient documentation

## 2022-01-25 DIAGNOSIS — Z79899 Other long term (current) drug therapy: Secondary | ICD-10-CM | POA: Insufficient documentation

## 2022-01-25 DIAGNOSIS — Z923 Personal history of irradiation: Secondary | ICD-10-CM | POA: Diagnosis not present

## 2022-01-25 DIAGNOSIS — C031 Malignant neoplasm of lower gum: Secondary | ICD-10-CM | POA: Diagnosis not present

## 2022-01-25 DIAGNOSIS — Z79890 Hormone replacement therapy (postmenopausal): Secondary | ICD-10-CM | POA: Insufficient documentation

## 2022-01-25 DIAGNOSIS — Z7951 Long term (current) use of inhaled steroids: Secondary | ICD-10-CM | POA: Diagnosis not present

## 2022-01-25 DIAGNOSIS — C060 Malignant neoplasm of cheek mucosa: Secondary | ICD-10-CM

## 2022-01-25 DIAGNOSIS — Z7952 Long term (current) use of systemic steroids: Secondary | ICD-10-CM | POA: Insufficient documentation

## 2022-01-25 DIAGNOSIS — Z8589 Personal history of malignant neoplasm of other organs and systems: Secondary | ICD-10-CM | POA: Diagnosis not present

## 2022-01-25 NOTE — Progress Notes (Signed)
Radiation Oncology         [phone] ________________________________  Name: Tammy Brady MRN: [id number]  Date: 01/25/2022  DOB: [birth year]  Follow-Up Visit Note  Outpatient  CC: Delia Chimes, PA-C  Diagnosis and Prior Radiotherapy:    ICD-10-CM   1. Carcinoma of lower gum (HCC)  C03.1     2. 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 Scheduled for left temporal artery biopsy this Friday Using a feeding tube?: N/A Weight changes, if any:  Wt Readings from Last 3 Encounters:  01/25/22 130 lb 12.8 oz (59.3 kg)  01/24/22 132 lb (59.9 kg)  01/19/22 131 lb 3.2 oz (59.5 kg)   Swallowing issues, if any: More related to difficulty chewing (still struggles with solid food); but tolerates liquids and soft foods well. Last saw Bridgett Larsson on 09/08/2021: "At this time pt cont to exhibit at least cont'd oral stage dysphagia due mainly to surgical changes with mandible. [name]'s swallowing is deemed WNL/WFL with water and puree although pt could likely tolerate dys II and III items as well. She reports eating a wider variety of foods from dys I-III. She has not used PEG in two weeks, and has maintained weight about 147-148lbs for 7 days." Smoking or chewing tobacco? None Using fluoride trays daily? Yes; 11/10/2021 Saw Dr. Nonie Hoyer On Lim [facility], [facility])  --Periodontal Will re-assess #30 at next prophy for possible need for ScRP Loss of restoration, likely onlay Discussed leaving the tooth as is as the tooth is cleansable and patient has been keeping it clean. No symptoms.  Patient, however, would like occlusion with the opposing tooth as she does not have many teeth to chew with. Will plan for a composite restoration  for now.  Given minimal tooth preparation, direct restoration was recommended, but will consider crown if restoration does not hold. --Abfraction and likely loss of restoration #5B Recommended direct restoration Right lateral tongue leukoplakic lesion  Consistent with hyperkeratosis s/s irritation against the teeth as the tongue is pushed to the right by the flap.  Expect to resolve after debulking surgery. Left dorsolateral tongue irritation Either secondary to irritation due to contact against upper teeth as the tongue is pushed up by the flap vs candidiasis.  Will try nystatin rinse for now. --Missing teeth Patient would like to replace missing teeth. Discussed with her that the currently remaining mandibular teeth are not good abutment candidates for an RPD. Implant may be possibility but will need a consultation with a prosthodontist to explore restorative options first.  Patient understands that based on the ridge shape and bone quality, she may not be a candidate for prosthesis.  Plan to refer to prosthodontics after debulking surgery.  --Periodontal Will re-assess #30 at next prophy for possible need for ScRP Loss of restoration, likely onlay Discussed leaving the tooth as is as the tooth is cleansable and patient has been keeping it clean. No symptoms. Patient, however, would like occlusion with the opposing tooth as she does not have many teeth to chew with. Will plan for a composite restoration for now. Given minimal tooth preparation, direct restoration was recommended, but will consider crown if restoration does not hold.   Last ENT visit was  on:  12/08/2021 Dr. Tiburcio Pea [facility]) I discussion with the patient and her husband regarding the events surrounding the aborted procedure.  I explained the circumstances, and though disappointed, she is in good spirits and glad to have the pack out.. She is going to consider her options regarding flap debulking as this decision is  largely dependent on her ability to get dental rehabilitation. Follow up with me PRN. She will contact me with questions or desire to move forward with debulking. She will continue to follow with Dr. Amada Jupiter for cancer surveillance. They have my contact information and know to contact me with any questions or concerns  10/13/2021 --Dr. Ileene Rubens  Patient saw Dr. Ihor Austin in the interim and would like to proceed with revision surgery but has not yet been scheduled.  She is waiting to schedule once her inflammation diminishes and she sees dental.  She presents for routine surveillance.  Overall she is doing very well.  The patient endorses persistent tongue soreness/irritation.  She has had her G-tube removed in the interim. She is following with dental routinely and states she is going to be evaluated for partial dentures.   Other notable issues, if any: Last saw medical oncologist (Dr. Chryl Heck) January 2023, and will follow-up again in January 2024     ALLERGIES:  is allergic to codeine and oxycodone.  Meds: Current Outpatient Medications  Medication Sig Dispense Refill   acetaminophen (TYLENOL) 500 MG tablet Take 500 mg by mouth every 6 (six) hours as needed for mild pain.     aspirin EC 81 MG tablet Take 1 tablet (81 mg total) by mouth daily. Swallow whole. 90 tablet 3   calcium carbonate (OS-CAL) 600 MG TABS Take 600 mg by mouth daily.     diltiazem (CARDIZEM CD) 120 MG 24 hr capsule 2 po qd 30 capsule 1   fluticasone (FLOVENT HFA) 110 MCG/ACT inhaler Inhale 2 puffs into the lungs 2 (two) times daily. 3 each 3   gabapentin (NEURONTIN) 300 MG capsule Take 300 mg by mouth 3 (three) times daily.     levothyroxine (SYNTHROID) 100 MCG tablet Take 1 tablet (100 mcg total) by mouth daily. 90 tablet 1   nitroGLYCERIN (NITROSTAT) 0.4 MG SL tablet Place 1 tablet (0.4 mg total) under the tongue every 5 (five) minutes as needed. 25 tablet 6   predniSONE (DELTASONE) 20 MG tablet 3 po qd  ('60mg'$  daily) 90 tablet 0   simvastatin (ZOCOR) 40 MG tablet TAKE 1 TABLET BY MOUTH AT  BEDTIME 90 tablet 3   sodium fluoride (SODIUM FLUORIDE 5000 PPM) 1.1 % GEL dental gel Place 1 pea-size drop into each tooth space of fluoride trays once a day at bedtime.  Leave trays in for 5 minutes and then remove.  Spit out excess fluoride, but DO NOT rinse with water, eat or drink for at least 30 minutes after use. 120 mL 11   traZODone (DESYREL) 50 MG tablet TAKE 1 TABLET BY MOUTH AT  BEDTIME 90 tablet 3   No current facility-administered medications for this encounter.    Physical Findings: The patient is in no acute distress. Patient is alert and oriented.  weight is 130 lb 12.8 oz (59.3 kg). Her temperature is 97.5 F (36.4 C) (abnormal). Her blood pressure is 130/65 and her pulse is 57 (abnormal). Her respiration is 20 and oxygen saturation is 97%. .    General: Alert and oriented, in no acute distress HEENT: Head is normocephalic. Extraocular movements are intact.  Mouth /Oropharynx- no visible tumor or thrush.   Heart: RRR Chest: CTAB Neck: no masses palpated; + anterior/submental lymphedema has improved with lymphatic therapy. Psychiatric: Judgment and insight are intact. Affect is appropriate.   Lab Findings: Lab Results  Component Value Date   WBC 9.8 01/24/2022   HGB 12.7 01/24/2022   HCT 37.5 01/24/2022   MCV 90 01/24/2022   PLT 248 01/24/2022    Radiographic Findings: CT HEAD WO CONTRAST (5MM)  Result Date: 01/19/2022 CLINICAL DATA:  Headache EXAM: CT HEAD WITHOUT CONTRAST TECHNIQUE: Contiguous axial images were obtained from the base of the skull through the vertex without intravenous contrast. RADIATION DOSE REDUCTION: This exam was performed according to the departmental dose-optimization program which includes automated exposure control, adjustment of the mA and/or kV according to patient size and/or use of iterative reconstruction technique. COMPARISON:  None Available.  FINDINGS: Brain: No evidence of acute infarction, hemorrhage, hydrocephalus, extra-axial collection or mass lesion/mass effect. Vascular: No hyperdense vessel or unexpected calcification. Skull: Normal. Negative for fracture or focal lesion. Sinuses/Orbits: No acute finding. Other: None. IMPRESSION: No acute intracranial abnormality. Electronically Signed   By: Yetta Glassman M.D.   On: 01/19/2022 11:30    Impression/Plan:     1) Head and Neck Cancer Status: In remission with no evidence of disease  2) Nutritional Status: Doing well overall with oral intake, she has lost some weight now that she no longer uses a feeding tube  3) Risk Factors: The patient has been educated about risk factors including alcohol and tobacco abuse; they understand that avoidance of alcohol and tobacco is important to prevent recurrences as well as other cancers  4) Swallowing: functional with soft/puree foods  5) Dental: She is following closely with dental medicine at [facility] and understands that I can be contacted if they have any questions regarding prior radiation doses to various tooth roots  6) Thyroid function:  on supplement via PCP Lab Results  Component Value Date   TSH 0.191 (L) 12/17/2021   7) lymphedema: continue using tactile vest as instructed  8) Continue f/u w/ ENT and med onc. She will see me in a year, sooner PRN  On date of service, in total, I spent 25 minutes on this encounter. Patient was seen in person.  This note was signed after date of service.  Time above is in reference to date of service only.. _____________________________________   Eppie Gibson, MD

## 2022-01-25 NOTE — Progress Notes (Signed)
Oncology Nurse Navigator Documentation   I met with Tammy Brady and Tammy Brady before and during Tammy visit with Dr. Isidore Moos today. She is recovering well from treatment. She is seeing Tammy ENT MD at [facility] regularly. She has been scheduled to see Dr. Isidore Moos in one year but knows to call me before if she has any concerns or questions.  Harlow Asa RN, BSN, OCN Head & Neck Oncology Nurse [place] at [facility] Phone # [phone]  Fax # [phone]

## 2022-01-28 ENCOUNTER — Encounter: Payer: Self-pay | Admitting: Radiation Oncology

## 2022-01-28 DIAGNOSIS — Z79899 Other long term (current) drug therapy: Secondary | ICD-10-CM | POA: Diagnosis not present

## 2022-01-28 DIAGNOSIS — E039 Hypothyroidism, unspecified: Secondary | ICD-10-CM | POA: Diagnosis not present

## 2022-01-28 DIAGNOSIS — R519 Headache, unspecified: Secondary | ICD-10-CM | POA: Diagnosis not present

## 2022-01-28 DIAGNOSIS — I2510 Atherosclerotic heart disease of native coronary artery without angina pectoris: Secondary | ICD-10-CM | POA: Diagnosis not present

## 2022-01-28 DIAGNOSIS — E785 Hyperlipidemia, unspecified: Secondary | ICD-10-CM | POA: Diagnosis not present

## 2022-01-28 DIAGNOSIS — J439 Emphysema, unspecified: Secondary | ICD-10-CM | POA: Diagnosis not present

## 2022-01-28 DIAGNOSIS — Z9049 Acquired absence of other specified parts of digestive tract: Secondary | ICD-10-CM | POA: Diagnosis not present

## 2022-01-28 DIAGNOSIS — I201 Angina pectoris with documented spasm: Secondary | ICD-10-CM | POA: Diagnosis not present

## 2022-01-28 DIAGNOSIS — J449 Chronic obstructive pulmonary disease, unspecified: Secondary | ICD-10-CM | POA: Diagnosis not present

## 2022-01-28 DIAGNOSIS — I10 Essential (primary) hypertension: Secondary | ICD-10-CM | POA: Diagnosis not present

## 2022-01-28 DIAGNOSIS — Z87891 Personal history of nicotine dependence: Secondary | ICD-10-CM | POA: Diagnosis not present

## 2022-01-28 DIAGNOSIS — K219 Gastro-esophageal reflux disease without esophagitis: Secondary | ICD-10-CM | POA: Diagnosis not present

## 2022-01-28 DIAGNOSIS — M316 Other giant cell arteritis: Secondary | ICD-10-CM | POA: Diagnosis not present

## 2022-02-04 DIAGNOSIS — R29898 Other symptoms and signs involving the musculoskeletal system: Secondary | ICD-10-CM | POA: Diagnosis not present

## 2022-02-04 DIAGNOSIS — Z09 Encounter for follow-up examination after completed treatment for conditions other than malignant neoplasm: Secondary | ICD-10-CM | POA: Diagnosis not present

## 2022-02-07 ENCOUNTER — Encounter: Payer: Self-pay | Admitting: Physician Assistant

## 2022-02-07 ENCOUNTER — Ambulatory Visit (INDEPENDENT_AMBULATORY_CARE_PROVIDER_SITE_OTHER): Payer: Medicare Other | Admitting: Physician Assistant

## 2022-02-07 VITALS — BP 122/64 | HR 58 | Temp 96.5°F | Ht 65.0 in | Wt 129.4 lb

## 2022-02-07 DIAGNOSIS — R634 Abnormal weight loss: Secondary | ICD-10-CM | POA: Diagnosis not present

## 2022-02-07 DIAGNOSIS — I10 Essential (primary) hypertension: Secondary | ICD-10-CM | POA: Diagnosis not present

## 2022-02-07 DIAGNOSIS — E038 Other specified hypothyroidism: Secondary | ICD-10-CM | POA: Diagnosis not present

## 2022-02-07 DIAGNOSIS — G4452 New daily persistent headache (NDPH): Secondary | ICD-10-CM | POA: Diagnosis not present

## 2022-02-07 NOTE — Progress Notes (Signed)
Subjective:  Patient ID: Tammy Brady, female    DOB: Aug 12, 1950  Age: 71 y.o. MRN: [id number]  Chief Complaint  Patient presents with   Follow-up    HPI  Pt here for follow up of headache/neuralgia.  She is currently on prednisone '60mg'$  qd as treatment for presumed giant cell arteritis.  According to patient she did have biopsy which was negative.  She needs to be weaned off prednisone now.  She states her headache has resolved.  Pt is currently taking cardizem '300mg'$  qd (instead of '240mg'$ ) because that is what she had in supply at home.  States bp is doing well and is not having any drops of pressure and no symptoms of feeling light headed or dizzy  Pt with history of hypothyroidism - is due to recheck TSH because her value was low at last visit and medication adjusted.  She is currently on synthroid 1108mg qd She has lost a few more pounds since last visit but states she thinks due to fact it is so hard for her to eat given her history of mouth surgery and flap placement Current Outpatient Medications on File Prior to Visit  Medication Sig Dispense Refill   acetaminophen (TYLENOL) 500 MG tablet Take 500 mg by mouth every 6 (six) hours as needed for mild pain.     aspirin EC 81 MG tablet Take 1 tablet (81 mg total) by mouth daily. Swallow whole. 90 tablet 3   calcium carbonate (OS-CAL) 600 MG TABS Take 600 mg by mouth daily.     diltiazem (CARDIZEM CD) 300 MG 24 hr capsule Take 300 mg by mouth daily.     fluticasone (FLOVENT HFA) 110 MCG/ACT inhaler Inhale 2 puffs into the lungs 2 (two) times daily. 3 each 3   gabapentin (NEURONTIN) 300 MG capsule Take 300 mg by mouth 3 (three) times daily.     levothyroxine (SYNTHROID) 100 MCG tablet Take 1 tablet (100 mcg total) by mouth daily. 90 tablet 1   nitroGLYCERIN (NITROSTAT) 0.4 MG SL tablet Place 1 tablet (0.4 mg total) under the tongue every 5 (five) minutes as needed. 25 tablet 6   predniSONE (DELTASONE) 20 MG tablet 3 po qd ('60mg'$  daily) 90  tablet 0   simvastatin (ZOCOR) 40 MG tablet TAKE 1 TABLET BY MOUTH AT  BEDTIME 90 tablet 3   sodium fluoride (SODIUM FLUORIDE 5000 PPM) 1.1 % GEL dental gel Place 1 pea-size drop into each tooth space of fluoride trays once a day at bedtime.  Leave trays in for 5 minutes and then remove.  Spit out excess fluoride, but DO NOT rinse with water, eat or drink for at least 30 minutes after use. 120 mL 11   traZODone (DESYREL) 50 MG tablet TAKE 1 TABLET BY MOUTH AT  BEDTIME 90 tablet 3   No current facility-administered medications on file prior to visit.   Past Medical History:  Diagnosis Date   [place]    CAD (coronary artery disease)    Mild non-obstructive disease by cath January 2011. Coronary CTA showed  mild (25-49%) plaque in the RCA and minimal (<25%) coronary CTA 04/2021 with coronary Ca score 257   Carotid artery disease (HCC)    Followed by Dr LKellie Simmering  GERD (gastroesophageal reflux disease)    Hyperlipidemia    Hypertension    Hypothyroidism    Past Surgical History:  Procedure Laterality Date   APPENDECTOMY     IR IMAGING GUIDED PORT INSERTION  03/02/2021  Family History  Problem Relation Age of Onset   Cancer Mother        esophageal   Heart disease Father    Coronary artery disease Father    Hyperlipidemia Sister    Hyperlipidemia Sister    Hyperlipidemia Brother    Hyperlipidemia Brother    Hyperlipidemia Brother    Coronary artery disease Maternal Grandmother    Social History   Socioeconomic History   Marital status: Married    Spouse name: Not on file   Number of children: 1   Years of education: Not on file   Highest education level: Not on file  Occupational History   Occupation: Field seismologist: [employer]  Tobacco Use   Smoking status: Former    Packs/day: 1.00    Years: 40.00    Total pack years: 40.00    Types: Cigarettes    Quit date: 05/11/2009    Years since quitting: 12.7   Smokeless tobacco: Never   Substance and Sexual Activity   Alcohol use: Not Currently   Drug use: No   Sexual activity: Not Currently  Other Topics Concern   Not on file  Social History Narrative   Not on file   Social Determinants of Health   Financial Resource Strain: Low Risk  (06/22/2021)   Overall Financial Resource Strain (CARDIA)    Difficulty of Paying Living Expenses: Not hard at all  Food Insecurity: No Food Insecurity (02/25/2021)   Hunger Vital Sign    Worried About Running Out of Food in the Last Year: Never true    [place] in the Last Year: Never true  Transportation Needs: No Transportation Needs (06/22/2021)   PRAPARE - Hydrologist (Medical): No    Lack of Transportation (Non-Medical): No  Physical Activity: Inactive (05/25/2021)   Exercise Vital Sign    Days of Exercise per Week: 0 days    Minutes of Exercise per Session: 0 min  Stress: No Stress Concern Present (02/25/2021)   [place]    Feeling of Stress : Only a little  Social Connections: Socially Integrated (02/25/2021)   Social Connection and Isolation Panel [NHANES]    Frequency of Communication with Friends and Family: More than three times a week    Frequency of Social Gatherings with Friends and Family: More than three times a week    Attends Religious Services: More than 4 times per year    Active Member of [employer] or Organizations: Yes    Attends Music therapist: More than 4 times per year    Marital Status: Married    Review of Systems CONSTITUTIONAL: see HPI E/N/T: Negative for ear pain, nasal congestion and sore throat.  CARDIOVASCULAR: Negative for chest pain, dizziness,  RESPIRATORY: Negative for recent cough and dyspnea.   NEUROLOGICAL: Negative for dizziness and headaches.     Objective:  PHYSICAL EXAM:   VS: BP 122/64 (BP Location: Left Arm, Patient Position: Sitting, Cuff Size: Normal)    Pulse (!) 58   Temp (!) 96.5 F (35.8 C) (Temporal)   Ht '5\' 5"'$  (1.651 m)   Wt 129 lb 6.4 oz (58.7 kg)   SpO2 99%   BMI 21.53 kg/m   GEN: Well nourished, well developed, in no acute distress   Cardiac: RRR; no murmurs,  Respiratory:  normal respiratory rate and pattern with no distress - normal breath sounds with no rales,  rhonchi, wheezes or rubs  Skin: warm and dry, no rash  Neuro:  Alert and Oriented x 3, Strength and sensation are intact - CN II-Xii grossly intact Psych: euthymic mood, appropriate affect and demeanor  Lab Results  Component Value Date   WBC 9.8 01/24/2022   HGB 12.7 01/24/2022   HCT 37.5 01/24/2022   PLT 248 01/24/2022   GLUCOSE 133 (H) 01/24/2022   CHOL 137 12/17/2021   TRIG 69 12/17/2021   HDL 66 12/17/2021   LDLCALC 57 12/17/2021   ALT 14 01/24/2022   AST 21 01/24/2022   NA 137 01/24/2022   K 4.5 01/24/2022   CL 101 01/24/2022   CREATININE 0.67 01/24/2022   BUN 26 01/24/2022   CO2 26 01/24/2022   TSH 0.191 (L) 12/17/2021   INR 0.99 07/16/2009   HGBA1C  07/15/2009    5.1 (NOTE) The ADA recommends the following therapeutic goal for glycemic control related to Hgb A1c measurement: Goal of therapy: <6.5 Hgb A1c  Reference: American Diabetes Association: Clinical Practice Recommendations 2010, Diabetes Care, 2010, 33: (Suppl  1).      Assessment & Plan:   Problem List Items Addressed This Visit       Cardiovascular and Mediastinum   Hypertension, essential - Primary   Relevant Medications   diltiazem (CARDIZEM CD) 300 MG 24 hr capsule Follow up in 3 weeks     Endocrine   Hypothyroidism Continue current meds TSH pending     Other   New daily persistent headache - resolved   Relevant Medications   Taper dose of prednisone   Weight loss Recommend increase protein supplements Weight check in 3 weeks  .  No orders of the defined types were placed in this encounter.   No orders of the defined types were placed in this encounter.     Follow-up: Return in about 3 weeks (around 02/28/2022) for follow up.  An After Visit Summary was printed and given to the patient.  Yetta Flock [facility] [phone]

## 2022-02-08 ENCOUNTER — Other Ambulatory Visit: Payer: Self-pay | Admitting: Physician Assistant

## 2022-02-08 DIAGNOSIS — E038 Other specified hypothyroidism: Secondary | ICD-10-CM

## 2022-02-08 LAB — TSH: TSH: 0.281 u[IU]/mL — ABNORMAL LOW (ref 0.450–4.500)

## 2022-02-08 MED ORDER — LEVOTHYROXINE SODIUM 88 MCG PO TABS: 88.0000 ug | ORAL_TABLET | Freq: Every day | ORAL | 1 refills | Status: DC

## 2022-02-18 DIAGNOSIS — J069 Acute upper respiratory infection, unspecified: Secondary | ICD-10-CM | POA: Diagnosis not present

## 2022-02-18 DIAGNOSIS — R509 Fever, unspecified: Secondary | ICD-10-CM | POA: Diagnosis not present

## 2022-02-23 ENCOUNTER — Encounter: Payer: Self-pay | Admitting: Cardiology

## 2022-02-23 ENCOUNTER — Telehealth: Payer: Self-pay | Admitting: Cardiology

## 2022-02-23 ENCOUNTER — Telehealth: Payer: Self-pay | Admitting: *Deleted

## 2022-02-23 ENCOUNTER — Ambulatory Visit: Payer: Medicare Other | Admitting: Cardiology

## 2022-02-23 VITALS — BP 112/62 | HR 64 | Ht 65.0 in | Wt 131.0 lb

## 2022-02-23 DIAGNOSIS — I6529 Occlusion and stenosis of unspecified carotid artery: Secondary | ICD-10-CM

## 2022-02-23 DIAGNOSIS — C069 Malignant neoplasm of mouth, unspecified: Secondary | ICD-10-CM | POA: Diagnosis not present

## 2022-02-23 DIAGNOSIS — I10 Essential (primary) hypertension: Secondary | ICD-10-CM

## 2022-02-23 DIAGNOSIS — I2510 Atherosclerotic heart disease of native coronary artery without angina pectoris: Secondary | ICD-10-CM | POA: Diagnosis not present

## 2022-02-23 DIAGNOSIS — E782 Mixed hyperlipidemia: Secondary | ICD-10-CM

## 2022-02-23 DIAGNOSIS — R072 Precordial pain: Secondary | ICD-10-CM | POA: Diagnosis not present

## 2022-02-23 LAB — BASIC METABOLIC PANEL
BUN/Creatinine Ratio: 19 (ref 12–28)
BUN: 14 mg/dL (ref 8–27)
CO2: 26 mmol/L (ref 20–29)
Calcium: 9.4 mg/dL (ref 8.7–10.3)
Chloride: 100 mmol/L (ref 96–106)
Creatinine, Ser: 0.72 mg/dL (ref 0.57–1.00)
Glucose: 104 mg/dL — ABNORMAL HIGH (ref 70–99)
Potassium: 4.6 mmol/L (ref 3.5–5.2)
Sodium: 139 mmol/L (ref 134–144)
eGFR: 90 mL/min/{1.73_m2} (ref >59)

## 2022-02-23 MED ORDER — METOPROLOL TARTRATE 50 MG PO TABS: 50.0000 mg | ORAL_TABLET | Freq: Once | ORAL | 0 refills | Status: DC

## 2022-02-23 NOTE — Telephone Encounter (Signed)
*  STAT* If patient is at the pharmacy, call can be transferred to refill team.   1. Which medications need to be refilled? (please list name of each medication and dose if known) metoprolol tartrate (LOPRESSOR) 50 MG tablet  2. Which pharmacy/location (including street and city if local pharmacy) is medication to be sent to? [place]  3. Do they need a 30 day or 90 day supply? 30  Pt states that this was sent to the wrong pharmacy and it'll take two weeks for her to receive this medication and needs it called into the [facility], also.

## 2022-02-23 NOTE — Patient Outreach (Signed)
  Care Coordination   Initial Visit Note   02/23/2022 Name: Tammy Brady MRN: [id number] DOB: Nov 24, 1950  Tammy Brady is a 71 y.o. year old female who sees Marge Duncans, [place] for primary care. I spoke with  Seward Speck by phone today  What matters to the patients health and wellness today?  Declines Care Coordination at this time.  Reminded pt to schedule her AWV- Annual Wellness Visit with PCP as she is due for this.     Goals Addressed   None     SDOH assessments and interventions completed:  No     Care Coordination Interventions Activated:  No  Care Coordination Interventions:  No, not indicated   Follow up plan: No further intervention required.   Encounter Outcome:  Pt. Refused   Eduard Clos MSW, LCSW Licensed Clinical Social Worker      [phone]

## 2022-02-23 NOTE — Telephone Encounter (Signed)
Pt's medication was sent to pt's pharmacy as requested. Confirmation received.  °

## 2022-02-23 NOTE — Patient Instructions (Addendum)
Medication Instructions:  Your physician recommends that you continue on your current medications as directed. Please refer to the Current Medication list given to you today.  *If you need a refill on your cardiac medications before your next appointment, please call your pharmacy*  Lab Work: TODAY: BMET If you have labs (blood work) drawn today and your tests are completely normal, you will receive your results only by: [place] (if you have MyChart) OR A paper copy in the mail If you have any lab test that is abnormal or we need to change your treatment, we will call you to review the results.  Testing/Procedures: Your physician has requested that you have a carotid duplex. This test is an ultrasound of the carotid arteries in your neck. It looks at blood flow through these arteries that supply the brain with blood. Allow one hour for this exam. There are no restrictions or special instructions.  Your physician has requested that you have cardiac CT. Cardiac computed tomography (CT) is a painless test that uses an x-ray machine to take clear, detailed pictures of your heart. See below for further instructions.   Follow-Up: At [facility], you and your health needs are our priority.  As part of our continuing mission to provide you with exceptional heart care, we have created designated Provider Care Teams.  These Care Teams include your primary Cardiologist (physician) and Advanced Practice Providers (APPs -  Physician Assistants and Nurse Practitioners) who all work together to provide you with the care you need, when you need it.  Your next appointment:   1 year(s)  The format for your next appointment:   In Person  Provider:   Fransico Him, MD     Other Instructions   Your cardiac CT will be scheduled at:   [facility] [address] [place], [place] [address] [phone]  Please arrive at the [facility] Entrance (Entrance C2) of [facility] 30 minutes prior to test start time. You can use the FREE valet parking offered at entrance C (encouraged to control the heart rate for the test)  Proceed to the [facility] Radiology Department (first floor) to check-in and test prep.  All radiology patients and guests should use entrance C2 at [facility], accessed from [facility], even though the hospital's physical address listed is [address]    Please follow these instructions carefully (unless otherwise directed):  On the Night Before the Test: Be sure to Drink plenty of water. Do not consume any caffeinated/decaffeinated beverages or chocolate 12 hours prior to your test. Do not take any antihistamines 12 hours prior to your test.  On the Day of the Test: Drink plenty of water until 1 hour prior to the test. Do not eat any food 4 hours prior to the test. You may take your regular medications prior to the test.  Take metoprolol (Lopressor) two hours prior to test. FEMALES- please wear underwire-free bra if available, avoid dresses & tight clothing  After the Test: Drink plenty of water. After receiving IV contrast, you may experience a mild flushed feeling. This is normal. On occasion, you may experience a mild rash up to 24 hours after the test. This is not dangerous. If this occurs, you can take Benadryl 25 mg and increase your fluid intake. If you experience trouble breathing, this can be serious. If it is severe call 911 IMMEDIATELY. If it is mild, please call our office. If  you take any of these medications: Glipizide/Metformin, Avandament, Glucavance, please do not take 48 hours after completing test unless otherwise instructed.  We will call to schedule your test 2-4 weeks out understanding that some insurance companies will need an authorization prior to the service being performed.   For non-scheduling related questions, please contact the cardiac imaging nurse navigator  should you have any questions/concerns: Marchia Bond, Cardiac Imaging Nurse Navigator Gordy Clement, Cardiac Imaging Nurse Navigator [facility] Direct Office Dial: [phone]   For scheduling needs, including cancellations and rescheduling, please call [place], [phone].   Important Information About Sugar

## 2022-02-23 NOTE — Progress Notes (Signed)
Cardiology Office Note    Date:  02/23/2022   ID:  Tammy Brady, Tammy Brady 06/23/1951, MRN [id number]  PCP:  Marge Duncans, PA-C  Cardiologist:  Fransico Him, MD   Chief Complaint  Patient presents with   Coronary Artery Disease   Hypertension   Hyperlipidemia     History of Present Illness:  Tammy Brady is a 71 y.o. female  with a hx of ASCAD, former tobacco abuse, HLD, HTN and carotid artery disease. She was initially admitted to [facility] in January 2011 with a NSTEMI. Cardiac cath 07/16/09 with 20% LAD stenosis, 20% diffuse RCA stenosis, no Circumflex disease. Her event was felt to be secondary to coronary vasospasm. She has been treated with Cardizem and uses SL NTG prn. She stopped smoking in November 2010. She has known carotid artery disease followed by Vascular surgery. She has GERD treated with Protonix. 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   She is here today for followup.  Since I saw her last she has had 3 episodes of chest pain all in the last month. She also has had bad HAs and had an arterial bx done which was negative for temporal arteritis and placed on Prednisone which increased her BP readings. She describes the pain in her chest as a sharp pain lasting 15 minutes and 1-2 times had jaw pain as well.  She took a NTG once which helped some.  There was no diaphoresis or nausea with the events and are nonexertional with one episode awakening her from sleep.  She has had SOB as well but is being treated for a URI currently and also was dx with COPD and is on an inhaler but has not noticed any improvement in SOB.. She denies any PND, orthopnea, LE edema, dizziness (except when standing up too fast), palpitations or syncope. She is compliant with her meds and is tolerating meds with no SE.     Past Medical History:  Diagnosis Date   Aortic atherosclerosis ([place])    CAD (coronary artery disease)    Mild non-obstructive  disease by cath January 2011. 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   aspirin EC 81 MG tablet Take 1 tablet (81 mg total) by mouth daily. Swallow whole.   calcium carbonate (OS-CAL) 600 MG TABS Take 600 mg by mouth daily.   diltiazem (CARDIZEM CD) 300 MG 24 hr capsule Take 300 mg by mouth daily.   fluticasone (FLOVENT HFA) 110 MCG/ACT inhaler Inhale 2 puffs into the lungs 2 (two) times daily.   gabapentin (NEURONTIN) 300 MG capsule Take 300 mg by mouth 3 (three) times daily.   levothyroxine (SYNTHROID) 88 MCG tablet Take 1 tablet (88 mcg total) by mouth daily before breakfast.  nitroGLYCERIN (NITROSTAT) 0.4 MG SL tablet Place 1 tablet (0.4 mg total) under the tongue every 5 (five) minutes as needed.   simvastatin (ZOCOR) 40 MG tablet TAKE 1 TABLET BY MOUTH AT  BEDTIME   sodium fluoride (SODIUM FLUORIDE 5000 PPM) 1.1 % GEL dental gel Place 1 pea-size drop into each tooth space of fluoride trays once a day at bedtime.  Leave trays in for 5 minutes and then remove.  Spit out excess fluoride, but DO NOT rinse with water, eat or drink for at least 30 minutes after use.   traZODone (DESYREL) 50 MG tablet TAKE 1 TABLET BY MOUTH AT  BEDTIME    Allergies:   Codeine and Oxycodone   Social History   Socioeconomic History   Marital status: Married    Spouse name: Not on file   Number of children: 1   Years of  education: Not on file   Highest education level: Not on file  Occupational History   Occupation: Field seismologist: [employer]  Tobacco Use   Smoking status: Former    Packs/day: 1.00    Years: 40.00    Total pack years: 40.00    Types: Cigarettes    Quit date: 05/11/2009    Years since quitting: 12.7   Smokeless tobacco: Never  Substance and Sexual Activity   Alcohol use: Not Currently   Drug use: No   Sexual activity: Not Currently  Other Topics Concern   Not on file  Social History Narrative   Not on file   Social Determinants of Health   Financial Resource Strain: Low Risk  (06/22/2021)   Overall Financial Resource Strain (CARDIA)    Difficulty of Paying Living Expenses: Not hard at all  Food Insecurity: No Food Insecurity (02/25/2021)   Hunger Vital Sign    Worried About Running Out of Food in the Last Year: Never true    [place] in the Last Year: Never true  Transportation Needs: No Transportation Needs (06/22/2021)   PRAPARE - Hydrologist (Medical): No    Lack of Transportation (Non-Medical): No  Physical Activity: Inactive (05/25/2021)   Exercise Vital Sign    Days of Exercise per Week: 0 days    Minutes of Exercise per Session: 0 min  Stress: No Stress Concern Present (02/25/2021)   [place]    Feeling of Stress : Only a little  Social Connections: Socially Integrated (02/25/2021)   Social Connection and Isolation Panel [NHANES]    Frequency of Communication with Friends and Family: More than three times a week    Frequency of Social Gatherings with Friends and Family: More than three times a week    Attends Religious Services: More than 4 times per year    Active Member of [employer] or Organizations: Yes    Attends Music therapist: More than 4 times per year    Marital Status: Married     Family History:  The patient's family history  includes Cancer in her mother; Coronary artery disease in her father and maternal grandmother; Heart disease in her father; Hyperlipidemia in her brother, brother, brother, sister, and sister.   ROS:   Please see the history of present illness.    ROS All other systems reviewed and are negative.      No data to display             PHYSICAL EXAM:  VS:  BP 112/62   Pulse 64   Ht '5\' 5"'$  (1.651 m)   Wt 131 lb (59.4 kg)   BMI 21.80 kg/m    GEN: Well nourished, well developed in no acute distress HEENT: Normal NECK: No JVD; No carotid bruits LYMPHATICS: No lymphadenopathy CARDIAC:RRR, no murmurs, rubs, gallops RESPIRATORY:  Clear to auscultation without rales, wheezing or rhonchi  ABDOMEN: Soft, non-tender, non-distended MUSCULOSKELETAL:  No edema; No deformity  SKIN: Warm and dry NEUROLOGIC:  Alert and oriented x 3 PSYCHIATRIC:  Normal affect   Wt Readings from Last 3 Encounters:  02/23/22 131 lb (59.4 kg)  02/07/22 129 lb 6.4 oz (58.7 kg)  01/25/22 130 lb 12.8 oz (59.3 kg)      Studies/Labs Reviewed:   EKG:  EKG is ordered today.  The ekg ordered today demonstrates NSR with nonspecific ST abnormality  Recent Labs: 05/31/2021: Magnesium 2.0 01/24/2022: ALT 14; BUN 26; Creatinine, Ser 0.67; Hemoglobin 12.7; Platelets 248; Potassium 4.5; Sodium 137 02/07/2022: TSH 0.281   Lipid Panel    Component Value Date/Time   CHOL 137 12/17/2021 1025   TRIG 69 12/17/2021 1025   HDL 66 12/17/2021 1025   CHOLHDL 2.1 12/17/2021 1025   CHOLHDL 2.1 07/16/2009 0020   VLDL 18 07/16/2009 0020   LDLCALC 57 12/17/2021 1025      Additional studies/ records that were reviewed today include:  Prior OV notes by Dr. Angelena Form and PCP notes    ASSESSMENT:    1. Atherosclerosis of native coronary artery of native heart without angina pectoris   2. Hypertension, essential   3. Mixed hyperlipidemia   4. Obstruction of carotid artery, unspecified laterality   5. Squamous cell  carcinoma of mouth (HCC)      PLAN:  In order of problems listed above:  ASCAD -s/p remote NSTEMI. Cardiac cath 07/16/09 with 20% LAD stenosis, 20% diffuse RCA stenosis, no Circumflex disease. Her event was felt to be secondary to coronary vasospasm.  -She recently started having problems with CP but episodes are atypical and sharp in nature but 1 episode did radiate to her jaw and slightly improved with NTG -continue prescription drug management with ASA '81mg'$  daily and Simvastatin '40mg'$  daily with PRN refills -avoid nitrates since she has been having problems with HAs -I will get a coronary CTA to redefine coronary anatomy and also rule out occult pathology in her lungs given hx of SSCA of the mouth  HTN -BP is adequately controlled on exam today -Continue prescription drug management with diltiazem CD 300 mg daily with as needed refills  HLD -LDL goal < 70 -I have personally reviewed and interpreted outside labs performed by patient's PCP which showed LDL 57 and HDL 66 on 12/17/2021 -Continue prescription drug management with simvastatin 40 mg daily with as needed refills  Carotid artery stenosis -Carotid Dopplers 2018 showed <40% right carotid artery stenosis and 40 to 59% left carotid artery stenosis -She has not been seen by vascular surgery in some time so we will get carotid Dopplers -Continue aspirin and statin therapy  Followup with me in 1 year  Time Spent: 25 minutes total time of encounter, including 15 minutes spent in face-to-face patient care on the date of this encounter. This time includes coordination of care and counseling regarding above mentioned problem list. Remainder of non-face-to-face time involved reviewing chart documents/testing relevant to the patient encounter and documentation in the medical record. I have independently reviewed documentation from referring provider  Medication Adjustments/Labs and Tests Ordered:  Current medicines are reviewed at length  with the patient today.  Concerns regarding medicines are outlined above.  Medication changes, Labs and Tests ordered today are listed in the Patient Instructions below.  There are no Patient Instructions on file for this visit.   Signed, Fransico Him, MD  02/23/2022 9:29 AM    [facility] [place], [place], [place]  [address] Phone: [phone]; Fax: [phone]

## 2022-02-23 NOTE — Addendum Note (Signed)
Addended by: Antonieta Iba on: 02/23/2022 09:40 AM   Modules accepted: Orders

## 2022-02-28 ENCOUNTER — Encounter: Payer: Self-pay | Admitting: Physician Assistant

## 2022-02-28 ENCOUNTER — Ambulatory Visit (INDEPENDENT_AMBULATORY_CARE_PROVIDER_SITE_OTHER): Payer: Medicare Other | Admitting: Physician Assistant

## 2022-02-28 VITALS — BP 122/64 | HR 67 | Temp 97.7°F | Resp 16 | Ht 65.0 in | Wt 130.6 lb

## 2022-02-28 DIAGNOSIS — R5381 Other malaise: Secondary | ICD-10-CM | POA: Insufficient documentation

## 2022-02-28 DIAGNOSIS — I10 Essential (primary) hypertension: Secondary | ICD-10-CM

## 2022-02-28 DIAGNOSIS — W57XXXA Bitten or stung by nonvenomous insect and other nonvenomous arthropods, initial encounter: Secondary | ICD-10-CM

## 2022-02-28 DIAGNOSIS — S30861A Insect bite (nonvenomous) of abdominal wall, initial encounter: Secondary | ICD-10-CM | POA: Insufficient documentation

## 2022-02-28 DIAGNOSIS — R3129 Other microscopic hematuria: Secondary | ICD-10-CM

## 2022-02-28 LAB — POCT URINALYSIS DIP (CLINITEK)
Bilirubin, UA: NEGATIVE
Glucose, UA: NEGATIVE mg/dL
Ketones, POC UA: NEGATIVE mg/dL
Leukocytes, UA: NEGATIVE
Nitrite, UA: NEGATIVE
Spec Grav, UA: 1.015 (ref 1.010–1.025)
Urobilinogen, UA: 0.2 E.U./dL
pH, UA: 5.5 (ref 5.0–8.0)

## 2022-02-28 MED ORDER — DOXYCYCLINE HYCLATE 100 MG PO TABS: 100.0000 mg | ORAL_TABLET | Freq: Two times a day (BID) | ORAL | 0 refills | Status: DC

## 2022-02-28 NOTE — Progress Notes (Signed)
Subjective:  Patient ID: Tammy Brady, female    DOB: [birth year]  Age: 71 y.o. MRN: [id number]  Chief Complaint  Patient presents with   Hypertension   Headache    HPI  Pt in today to follow up with blood pressure - she states overall her bp is doing better since switching back to cardizem 300 qd.  She is however having intermittent headaches - she describes as 'all over' at times and sometimes behind both eyes - has had some uri symptoms earlier in the month (fever, malaise, mild cough) and was treated with zpack Upon obtaining history she has had tick exposure as well several weeks ago  Pt states when seen at urgent care she was diagnosed with uri but told she had large amount of blood in her urine - she denies any urine symptoms but I do recommend further evaluation She has seen urology in past for recurrent hematuria but that has been several years ago  Pt has seen cardiology recently - is scheduled for carotid ultrasound and CTA of chest  Current Outpatient Medications on File Prior to Visit  Medication Sig Dispense Refill   acetaminophen (TYLENOL) 500 MG tablet Take 500 mg by mouth every 6 (six) hours as needed for mild pain.     aspirin EC 81 MG tablet Take 1 tablet (81 mg total) by mouth daily. Swallow whole. 90 tablet 3   calcium carbonate (OS-CAL) 600 MG TABS Take 600 mg by mouth daily.     diltiazem (CARDIZEM CD) 300 MG 24 hr capsule Take 300 mg by mouth daily.     fluticasone (FLOVENT HFA) 110 MCG/ACT inhaler Inhale 2 puffs into the lungs 2 (two) times daily. 3 each 3   gabapentin (NEURONTIN) 300 MG capsule Take 300 mg by mouth 3 (three) times daily.     levothyroxine (SYNTHROID) 88 MCG tablet Take 1 tablet (88 mcg total) by mouth daily before breakfast. 30 tablet 1   metoprolol tartrate (LOPRESSOR) 50 MG tablet Take 1 tablet (50 mg total) by mouth once for 1 dose. Take 1 tablet two hours prior to CT scan 1 tablet 0   nitroGLYCERIN (NITROSTAT) 0.4 MG SL tablet Place 1 tablet  (0.4 mg total) under the tongue every 5 (five) minutes as needed. 25 tablet 6   simvastatin (ZOCOR) 40 MG tablet TAKE 1 TABLET BY MOUTH AT  BEDTIME 90 tablet 3   sodium fluoride (SODIUM FLUORIDE 5000 PPM) 1.1 % GEL dental gel Place 1 pea-size drop into each tooth space of fluoride trays once a day at bedtime.  Leave trays in for 5 minutes and then remove.  Spit out excess fluoride, but DO NOT rinse with water, eat or drink for at least 30 minutes after use. 120 mL 11   traZODone (DESYREL) 50 MG tablet TAKE 1 TABLET BY MOUTH AT  BEDTIME 90 tablet 3   No current facility-administered medications on file prior to visit.   Past Medical History:  Diagnosis Date   Aortic atherosclerosis ([place])    CAD (coronary artery disease)    Mild non-obstructive disease by cath January 2011. 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   Past Surgical History:  Procedure Laterality Date   APPENDECTOMY     IR IMAGING GUIDED PORT INSERTION  03/02/2021    Family History  Problem Relation Age of Onset   Cancer Mother        esophageal   Heart disease Father    Coronary artery disease Father    Hyperlipidemia Sister    Hyperlipidemia Sister    Hyperlipidemia Brother    Hyperlipidemia Brother    Hyperlipidemia Brother    Coronary artery disease Maternal Grandmother    Social History   Socioeconomic History   Marital status: Married    Spouse name: Not on file   Number of children: 1   Years of education: Not on file   Highest education level: Not on file  Occupational History   Occupation: Field seismologist: [employer]  Tobacco Use   Smoking status: Former    Packs/day: 1.00    Years:  40.00    Total pack years: 40.00    Types: Cigarettes    Quit date: 05/11/2009    Years since quitting: 12.8   Smokeless tobacco: Never  Substance and Sexual Activity   Alcohol use: Not Currently   Drug use: No   Sexual activity: Not Currently  Other Topics Concern   Not on file  Social History Narrative   Not on file   Social Determinants of Health   Financial Resource Strain: Low Risk  (06/22/2021)   Overall Financial Resource Strain (CARDIA)    Difficulty of Paying Living Expenses: Not hard at all  Food Insecurity: No Food Insecurity (02/25/2021)   Hunger Vital Sign    Worried About Running Out of Food in the Last Year: Never true    Ran Out of Food in the Last Year: Never true  Transportation Needs: No Transportation Needs (06/22/2021)   PRAPARE - Hydrologist (Medical): No    Lack of Transportation (Non-Medical): No  Physical Activity: Inactive (05/25/2021)   Exercise Vital Sign    Days of Exercise per Week: 0 days    Minutes of Exercise per Session: 0 min  Stress: No Stress Concern Present (02/25/2021)   [place]    Feeling of Stress : Only a little  Social Connections: Socially Integrated (02/25/2021)   Social Connection and Isolation Panel [NHANES]    Frequency of Communication with Friends and Family: More than three times a week    Frequency of Social Gatherings with Friends and Family: More than three times a week    Attends Religious Services: More than 4 times per year    Active Member of [employer] or Organizations: Yes    Attends Music therapist: More than 4 times per year    Marital Status: Married    Review of Systems  CONSTITUTIONAL: see HPI E/N/T: Negative for ear pain, nasal congestion and sore throat.  CARDIOVASCULAR: Negative for chest pain, dizziness, palpitations and pedal edema.  RESPIRATORY: Negative for recent cough and dyspnea.   GASTROINTESTINAL: Negative for abdominal pain, acid reflux symptoms, constipation, diarrhea, nausea and vomiting.  MSK: Negative for arthralgias and myalgias.  INTEGUMENTARY: Negative for rash.  NEUROLOGICAL -see HPI      Objective:  PHYSICAL EXAM:   VS: BP 122/64   Pulse 67   Temp 97.7 F (36.5 C)   Resp 16   Ht '5\' 5"'$  (1.651 m)   Wt 130  lb 9.6 oz (59.2 kg)   SpO2 92%   BMI 21.73 kg/m   GEN: Well nourished, well developed, in no acute distress  HEENT: normal external ears and nose - normal external auditory canals and TMS - hearing grossly normal - normal nasal mucosa and septum - Oropharynx - normal posterior pharynx - flap noted   Cardiac: RRR; no murmurs,  Respiratory:  normal respiratory rate and pattern with no distress - normal breath sounds with no rales, rhonchi, wheezes or rubs  Skin: warm and dry, no rash  Neuro:  Alert and Oriented x 3, Strength and sensation are intact -    Lab Results  Component Value Date   WBC 9.8 01/24/2022   HGB 12.7 01/24/2022   HCT 37.5 01/24/2022   PLT 248 01/24/2022   GLUCOSE 104 (H) 02/23/2022   CHOL 137 12/17/2021   TRIG 69 12/17/2021   HDL 66 12/17/2021   LDLCALC 57 12/17/2021   ALT 14 01/24/2022   AST 21 01/24/2022   NA 139 02/23/2022   K 4.6 02/23/2022   CL 100 02/23/2022   CREATININE 0.72 02/23/2022   BUN 14 02/23/2022   CO2 26 02/23/2022   TSH 0.281 (L) 02/07/2022   INR 0.99 07/16/2009   HGBA1C  07/15/2009    5.1 (NOTE) The ADA recommends the following therapeutic goal for glycemic control related to Hgb A1c measurement: Goal of therapy: <6.5 Hgb A1c  Reference: American Diabetes Association: Clinical Practice Recommendations 2010, Diabetes Care, 2010, 33: (Suppl  1).      Assessment & Plan:   Problem List Items Addressed This Visit       Cardiovascular and Mediastinum   Hypertension, essential - Primary Continue current meds     Musculoskeletal and Integument   Tick bite of abdomen Labwork pending      Genitourinary   Hematuria, microscopic   Relevant Orders   Urine Culture ua     Other   Malaise   Relevant Orders   POCT URINALYSIS DIP (CLINITEK)   CBC with Differential/Platelet   Lyme Disease Serology w/Reflex  .  Meds ordered this encounter  Medications   doxycycline (VIBRA-TABS) 100 MG tablet    Sig: Take 1 tablet (100 mg total) by mouth 2 (two) times daily.    Dispense:  20 tablet    Refill:  0    Order Specific Question:   Supervising Provider    AnswerShelton Silvas    Orders Placed This Encounter  Procedures   Urine Culture   CBC with Differential/Platelet   Lyme Disease Serology w/Reflex   POCT URINALYSIS DIP (CLINITEK)     Follow-up: Return in about 2 months (around 04/30/2022) for follow up.  An After Visit Summary was printed and given to the patient.  Yetta Flock [facility] [phone]

## 2022-03-01 ENCOUNTER — Other Ambulatory Visit: Payer: Self-pay | Admitting: Physician Assistant

## 2022-03-01 ENCOUNTER — Other Ambulatory Visit: Payer: Self-pay

## 2022-03-01 ENCOUNTER — Inpatient Hospital Stay: Payer: Medicare Other | Attending: Hematology and Oncology

## 2022-03-01 DIAGNOSIS — R899 Unspecified abnormal finding in specimens from other organs, systems and tissues: Secondary | ICD-10-CM

## 2022-03-01 DIAGNOSIS — Z452 Encounter for adjustment and management of vascular access device: Secondary | ICD-10-CM | POA: Diagnosis not present

## 2022-03-01 DIAGNOSIS — Z95828 Presence of other vascular implants and grafts: Secondary | ICD-10-CM

## 2022-03-01 DIAGNOSIS — C060 Malignant neoplasm of cheek mucosa: Secondary | ICD-10-CM | POA: Diagnosis not present

## 2022-03-01 LAB — CBC WITH DIFFERENTIAL/PLATELET
Basophils Absolute: 0 10*3/uL (ref 0.0–0.2)
Basos: 1 %
EOS (ABSOLUTE): 0.2 10*3/uL (ref 0.0–0.4)
Eos: 3 %
Hematocrit: 32.7 % — ABNORMAL LOW (ref 34.0–46.6)
Hemoglobin: 10.6 g/dL — ABNORMAL LOW (ref 11.1–15.9)
Immature Grans (Abs): 0 10*3/uL (ref 0.0–0.1)
Immature Granulocytes: 0 %
Lymphocytes Absolute: 1.1 10*3/uL (ref 0.7–3.1)
Lymphs: 21 %
MCH: 29.8 pg (ref 26.6–33.0)
MCHC: 32.4 g/dL (ref 31.5–35.7)
MCV: 92 fL (ref 79–97)
Monocytes Absolute: 0.7 10*3/uL (ref 0.1–0.9)
Monocytes: 14 %
Neutrophils Absolute: 3 10*3/uL (ref 1.4–7.0)
Neutrophils: 61 %
Platelets: 379 10*3/uL (ref 150–450)
RBC: 3.56 x10E6/uL — ABNORMAL LOW (ref 3.77–5.28)
RDW: 13.9 % (ref 11.7–15.4)
WBC: 5 10*3/uL (ref 3.4–10.8)

## 2022-03-01 LAB — URINE CULTURE

## 2022-03-01 LAB — LYME DISEASE SEROLOGY W/REFLEX: Lyme Total Antibody EIA: NEGATIVE

## 2022-03-01 MED ORDER — SODIUM CHLORIDE 0.9% FLUSH
10.0000 mL | Freq: Once | INTRAVENOUS | Status: AC
  Administered 2022-03-01: 10 mL

## 2022-03-01 MED ORDER — HEPARIN SOD (PORK) LOCK FLUSH 100 UNIT/ML IV SOLN
500.0000 [IU] | Freq: Once | INTRAVENOUS | Status: AC
  Administered 2022-03-01: 500 [IU]

## 2022-03-02 DIAGNOSIS — R29898 Other symptoms and signs involving the musculoskeletal system: Secondary | ICD-10-CM | POA: Diagnosis not present

## 2022-03-07 ENCOUNTER — Other Ambulatory Visit: Payer: Medicare Other

## 2022-03-07 DIAGNOSIS — R899 Unspecified abnormal finding in specimens from other organs, systems and tissues: Secondary | ICD-10-CM

## 2022-03-08 ENCOUNTER — Ambulatory Visit (HOSPITAL_COMMUNITY)
Admission: RE | Admit: 2022-03-08 | Discharge: 2022-03-08 | Disposition: A | Discharge status: Home / Self Care | Payer: Medicare Other | Source: Ambulatory Visit | Attending: Cardiovascular Disease | Admitting: Cardiovascular Disease

## 2022-03-08 DIAGNOSIS — I10 Essential (primary) hypertension: Secondary | ICD-10-CM | POA: Insufficient documentation

## 2022-03-08 DIAGNOSIS — I6529 Occlusion and stenosis of unspecified carotid artery: Secondary | ICD-10-CM | POA: Diagnosis not present

## 2022-03-08 DIAGNOSIS — C069 Malignant neoplasm of mouth, unspecified: Secondary | ICD-10-CM | POA: Insufficient documentation

## 2022-03-08 DIAGNOSIS — E782 Mixed hyperlipidemia: Secondary | ICD-10-CM | POA: Insufficient documentation

## 2022-03-08 DIAGNOSIS — I2510 Atherosclerotic heart disease of native coronary artery without angina pectoris: Secondary | ICD-10-CM | POA: Diagnosis not present

## 2022-03-08 DIAGNOSIS — R072 Precordial pain: Secondary | ICD-10-CM | POA: Diagnosis not present

## 2022-03-08 DIAGNOSIS — I6523 Occlusion and stenosis of bilateral carotid arteries: Secondary | ICD-10-CM

## 2022-03-08 LAB — CBC WITH DIFFERENTIAL/PLATELET
Basophils Absolute: 0.1 10*3/uL (ref 0.0–0.2)
Basos: 2 %
EOS (ABSOLUTE): 0.1 10*3/uL (ref 0.0–0.4)
Eos: 2 %
Hematocrit: 35.8 % (ref 34.0–46.6)
Hemoglobin: 11.3 g/dL (ref 11.1–15.9)
Immature Grans (Abs): 0 10*3/uL (ref 0.0–0.1)
Immature Granulocytes: 1 %
Lymphocytes Absolute: 1 10*3/uL (ref 0.7–3.1)
Lymphs: 16 %
MCH: 28.9 pg (ref 26.6–33.0)
MCHC: 31.6 g/dL (ref 31.5–35.7)
MCV: 92 fL (ref 79–97)
Monocytes Absolute: 0.6 10*3/uL (ref 0.1–0.9)
Monocytes: 9 %
Neutrophils Absolute: 4.5 10*3/uL (ref 1.4–7.0)
Neutrophils: 70 %
Platelets: 417 10*3/uL (ref 150–450)
RBC: 3.91 x10E6/uL (ref 3.77–5.28)
RDW: 14.1 % (ref 11.7–15.4)
WBC: 6.4 10*3/uL (ref 3.4–10.8)

## 2022-03-08 LAB — IRON,TIBC AND FERRITIN PANEL
Ferritin: 64 ng/mL (ref 15–150)
Iron Saturation: 16 % (ref 15–55)
Iron: 54 ug/dL (ref 27–139)
Total Iron Binding Capacity: 344 ug/dL (ref 250–450)
UIBC: 290 ug/dL (ref 118–369)

## 2022-03-10 ENCOUNTER — Encounter: Payer: Self-pay | Admitting: Cardiology

## 2022-03-11 ENCOUNTER — Ambulatory Visit (INDEPENDENT_AMBULATORY_CARE_PROVIDER_SITE_OTHER): Payer: Medicare Other

## 2022-03-11 DIAGNOSIS — R899 Unspecified abnormal finding in specimens from other organs, systems and tissues: Secondary | ICD-10-CM | POA: Diagnosis not present

## 2022-03-11 LAB — POC HEMOCCULT BLD/STL (HOME/3-CARD/SCREEN)
Card #1 Date: 8282023
Card #2 Date: 8292023
Card #2 Fecal Occult Blod, POC: NEGATIVE
Card #3 Date: 8312023
Card #3 Fecal Occult Blood, POC: NEGATIVE
Fecal Occult Blood, POC: NEGATIVE

## 2022-03-11 NOTE — Progress Notes (Signed)
All hemoccult cards were negative. Patient made aware of results.   Tammy Brady, [place] 03/11/22 9:34 AM

## 2022-03-24 DIAGNOSIS — H5319 Other subjective visual disturbances: Secondary | ICD-10-CM | POA: Diagnosis not present

## 2022-03-24 DIAGNOSIS — H35033 Hypertensive retinopathy, bilateral: Secondary | ICD-10-CM | POA: Diagnosis not present

## 2022-03-24 DIAGNOSIS — H43823 Vitreomacular adhesion, bilateral: Secondary | ICD-10-CM | POA: Diagnosis not present

## 2022-03-24 DIAGNOSIS — H25813 Combined forms of age-related cataract, bilateral: Secondary | ICD-10-CM | POA: Diagnosis not present

## 2022-03-30 ENCOUNTER — Other Ambulatory Visit: Payer: Self-pay

## 2022-03-30 DIAGNOSIS — E038 Other specified hypothyroidism: Secondary | ICD-10-CM

## 2022-03-30 MED ORDER — LEVOTHYROXINE SODIUM 88 MCG PO TABS: 88.0000 ug | ORAL_TABLET | Freq: Every day | ORAL | 0 refills | Status: DC

## 2022-04-06 ENCOUNTER — Other Ambulatory Visit: Payer: Medicare Other

## 2022-04-06 ENCOUNTER — Telehealth (HOSPITAL_COMMUNITY): Payer: Self-pay | Admitting: *Deleted

## 2022-04-06 ENCOUNTER — Other Ambulatory Visit (HOSPITAL_COMMUNITY): Payer: Self-pay | Admitting: Cardiology

## 2022-04-06 DIAGNOSIS — E038 Other specified hypothyroidism: Secondary | ICD-10-CM

## 2022-04-06 DIAGNOSIS — I779 Disorder of arteries and arterioles, unspecified: Secondary | ICD-10-CM

## 2022-04-06 NOTE — Telephone Encounter (Signed)
Reaching out to patient to offer assistance regarding upcoming cardiac imaging study; pt verbalizes understanding of appt date/time, parking situation and where to check in, medications ordered, and verified current allergies; name and call back number provided for further questions should they arise  Gordy Clement RN Navigator Cardiac [place] [facility] [phone] office [phone] cell  Patient to take '50mg'$  metoprolol tartrate TWO hours prior to her cardiac CT scan. She is aware to arrive at 10:30am.

## 2022-04-07 ENCOUNTER — Ambulatory Visit (HOSPITAL_COMMUNITY)
Admission: RE | Admit: 2022-04-07 | Discharge: 2022-04-07 | Disposition: A | Discharge status: Home / Self Care | Payer: Medicare Other | Source: Ambulatory Visit | Attending: Cardiology | Admitting: Cardiology

## 2022-04-07 DIAGNOSIS — R072 Precordial pain: Secondary | ICD-10-CM | POA: Insufficient documentation

## 2022-04-07 LAB — TSH: TSH: 0.591 u[IU]/mL (ref 0.450–4.500)

## 2022-04-07 MED ORDER — IOHEXOL 350 MG/ML SOLN
95.0000 mL | Freq: Once | INTRAVENOUS | Status: AC | PRN
  Administered 2022-04-07: 95 mL via INTRAVENOUS

## 2022-04-07 MED ORDER — NITROGLYCERIN 0.4 MG SL SUBL
0.8000 mg | SUBLINGUAL_TABLET | Freq: Once | SUBLINGUAL | Status: AC
  Administered 2022-04-07: 0.8 mg via SUBLINGUAL

## 2022-04-07 MED ORDER — NITROGLYCERIN 0.4 MG SL SUBL
SUBLINGUAL_TABLET | SUBLINGUAL | Status: AC
  Filled 2022-04-07: qty 2

## 2022-04-08 ENCOUNTER — Encounter: Payer: Self-pay | Admitting: Cardiology

## 2022-04-08 DIAGNOSIS — C060 Malignant neoplasm of cheek mucosa: Secondary | ICD-10-CM | POA: Diagnosis not present

## 2022-04-08 DIAGNOSIS — K036 Deposits [accretions] on teeth: Secondary | ICD-10-CM | POA: Diagnosis not present

## 2022-04-12 ENCOUNTER — Inpatient Hospital Stay: Payer: Medicare Other | Attending: Hematology and Oncology

## 2022-04-12 DIAGNOSIS — Z452 Encounter for adjustment and management of vascular access device: Secondary | ICD-10-CM | POA: Insufficient documentation

## 2022-04-12 DIAGNOSIS — C060 Malignant neoplasm of cheek mucosa: Secondary | ICD-10-CM | POA: Insufficient documentation

## 2022-04-12 DIAGNOSIS — Z95828 Presence of other vascular implants and grafts: Secondary | ICD-10-CM

## 2022-04-12 MED ORDER — HEPARIN SOD (PORK) LOCK FLUSH 100 UNIT/ML IV SOLN
500.0000 [IU] | Freq: Once | INTRAVENOUS | Status: AC
  Administered 2022-04-12: 500 [IU]

## 2022-04-12 MED ORDER — SODIUM CHLORIDE 0.9% FLUSH
10.0000 mL | Freq: Once | INTRAVENOUS | Status: AC
  Administered 2022-04-12: 10 mL

## 2022-04-13 DIAGNOSIS — C031 Malignant neoplasm of lower gum: Secondary | ICD-10-CM | POA: Diagnosis not present

## 2022-04-13 DIAGNOSIS — C760 Malignant neoplasm of head, face and neck: Secondary | ICD-10-CM | POA: Diagnosis not present

## 2022-05-02 ENCOUNTER — Ambulatory Visit (INDEPENDENT_AMBULATORY_CARE_PROVIDER_SITE_OTHER): Payer: Medicare Other | Admitting: Physician Assistant

## 2022-05-02 ENCOUNTER — Encounter: Payer: Self-pay | Admitting: Physician Assistant

## 2022-05-02 VITALS — BP 108/68 | HR 57 | Temp 97.3°F | Ht 65.0 in | Wt 128.4 lb

## 2022-05-02 DIAGNOSIS — I2510 Atherosclerotic heart disease of native coronary artery without angina pectoris: Secondary | ICD-10-CM

## 2022-05-02 DIAGNOSIS — R3129 Other microscopic hematuria: Secondary | ICD-10-CM | POA: Diagnosis not present

## 2022-05-02 DIAGNOSIS — E038 Other specified hypothyroidism: Secondary | ICD-10-CM | POA: Diagnosis not present

## 2022-05-02 DIAGNOSIS — E559 Vitamin D deficiency, unspecified: Secondary | ICD-10-CM

## 2022-05-02 DIAGNOSIS — I10 Essential (primary) hypertension: Secondary | ICD-10-CM

## 2022-05-02 DIAGNOSIS — R634 Abnormal weight loss: Secondary | ICD-10-CM

## 2022-05-02 DIAGNOSIS — D0000 Carcinoma in situ of oral cavity, unspecified site: Secondary | ICD-10-CM

## 2022-05-02 DIAGNOSIS — F3289 Other specified depressive episodes: Secondary | ICD-10-CM

## 2022-05-02 LAB — POCT URINALYSIS DIP (CLINITEK)
Bilirubin, UA: NEGATIVE
Glucose, UA: NEGATIVE mg/dL
Ketones, POC UA: NEGATIVE mg/dL
Leukocytes, UA: NEGATIVE
Nitrite, UA: NEGATIVE
POC PROTEIN,UA: NEGATIVE
Spec Grav, UA: 1.015 (ref 1.010–1.025)
Urobilinogen, UA: 0.2 E.U./dL
pH, UA: 5 (ref 5.0–8.0)

## 2022-05-02 NOTE — Progress Notes (Signed)
Subjective:  Patient ID: Tammy Brady, female    DOB: 11/30/1950  Age: 71 y.o. MRN: [id number]  Chief Complaint  Patient presents with   Hypertension    HPI  Pt presents for follow up of hypertension.  The patient is tolerating the medication well without side effects. Compliance with treatment has been good; including taking medication as directed , maintains a healthy diet and regular exercise regimen , and following up as directed. Currently on cardizem CD '300mg'$  qd.  States bp running well at home  Pt with history of hypothyroidism - currently on synthroid 36mg qd - due for labwork  Pt has seen cardiology since last visit with me.  She states she had a carotid ultrasound done which was stable according to pt and a CTA of her chest.  Those results showed aortic atherosclerosis and emphysema  Mixed hyperlipidemia  Pt presents with hyperlipidemia.  Compliance with treatment has been good -The patient is compliant with medications, maintains a low cholesterol diet , follows up as directed , and maintains an exercise regimen . The patient denies experiencing any hypercholesterolemia related symptoms. She is currently taking zocor '40mg'$  qd  Pt with history of emphysema.  She has flovent HFA 110 - does not use regularly but states breathing is good and having no current problems  Pt with history of mild depression - symptoms stable on desyrel '50mg'$  qhs  Pt with history of squamous cell carcinoma of oral cavity with surgical resection.  She is still following with Head[facility]Neck specialists in [facility]  She is still having some problems with eating as far as chewing and that limits what she can and can't eat.  She has lost another 2 pounds since last visit however she states she is not drinking protein shakes daily as suggested and while on long vacation her options were limited.  States she does have appetite and will adjust diet.   At last visit it was noted she had trace of hematuria with  negative culture - due to repeat ua today Denies any urine symptoms  Current Outpatient Medications on File Prior to Visit  Medication Sig Dispense Refill   acetaminophen (TYLENOL) 500 MG tablet Take 500 mg by mouth every 6 (six) hours as needed for mild pain.     aspirin EC 81 MG tablet Take 1 tablet (81 mg total) by mouth daily. Swallow whole. 90 tablet 3   calcium carbonate (OS-CAL) 600 MG TABS Take 600 mg by mouth daily.     diltiazem (CARDIZEM CD) 300 MG 24 hr capsule Take 300 mg by mouth daily.     fluticasone (FLOVENT HFA) 110 MCG/ACT inhaler Inhale 2 puffs into the lungs 2 (two) times daily. 3 each 3   levothyroxine (SYNTHROID) 88 MCG tablet Take 1 tablet (88 mcg total) by mouth daily before breakfast. 90 tablet 0   nitroGLYCERIN (NITROSTAT) 0.4 MG SL tablet Place 1 tablet (0.4 mg total) under the tongue every 5 (five) minutes as needed. 25 tablet 6   simvastatin (ZOCOR) 40 MG tablet TAKE 1 TABLET BY MOUTH AT  BEDTIME 90 tablet 3   sodium fluoride (SODIUM FLUORIDE 5000 PPM) 1.1 % GEL dental gel Place 1 pea-size drop into each tooth space of fluoride trays once a day at bedtime.  Leave trays in for 5 minutes and then remove.  Spit out excess fluoride, but DO NOT rinse with water, eat or drink for at least 30 minutes after use. 120 mL 11   traZODone (  DESYREL) 50 MG tablet TAKE 1 TABLET BY MOUTH AT  BEDTIME 90 tablet 3   No current facility-administered medications on file prior to visit.   Past Medical History:  Diagnosis Date   Aortic atherosclerosis ([place])    CAD (coronary artery disease)    Mild non-obstructive disease by cath January 2011. Coronary CTA showed  mild (25-49%) plaque in the RCA and minimal (<25%) coronary CTA 04/2021 with coronary Ca score 257.  Cor Ca score 448 with 25-49% RCA and <25% pLCx and pLAD by coronary CTA 03/2022   Carotid artery disease (HCC)    1 to 39% right ICA stenosis and 40 to 59% left ICA stenosis by Dopplers 02/2022   GERD (gastroesophageal reflux  disease)    Hyperlipidemia    Hypertension    Hypothyroidism    Squamous cell carcinoma of mouth (HCC)    s/p resection and XRT and chemo   Past Surgical History:  Procedure Laterality Date   APPENDECTOMY     IR IMAGING GUIDED PORT INSERTION  03/02/2021    Family History  Problem Relation Age of Onset   Cancer Mother        esophageal   Heart disease Father    Coronary artery disease Father    Hyperlipidemia Sister    Hyperlipidemia Sister    Hyperlipidemia Brother    Hyperlipidemia Brother    Hyperlipidemia Brother    Coronary artery disease Maternal Grandmother    Social History   Socioeconomic History   Marital status: Married    Spouse name: Not on file   Number of children: 1   Years of education: Not on file   Highest education level: Not on file  Occupational History   Occupation: Field seismologist: [employer]  Tobacco Use   Smoking status: Former    Packs/day: 1.00    Years: 40.00    Total pack years: 40.00    Types: Cigarettes    Quit date: 05/11/2009    Years since quitting: 12.9   Smokeless tobacco: Never  Substance and Sexual Activity   Alcohol use: Not Currently   Drug use: No   Sexual activity: Not Currently  Other Topics Concern   Not on file  Social History Narrative   Not on file   Social Determinants of Health   Financial Resource Strain: Low Risk  (06/22/2021)   Overall Financial Resource Strain (CARDIA)    Difficulty of Paying Living Expenses: Not hard at all  Food Insecurity: No Food Insecurity (02/25/2021)   Hunger Vital Sign    Worried About Running Out of Food in the Last Year: Never true    [place] in the Last Year: Never true  Transportation Needs: No Transportation Needs (06/22/2021)   PRAPARE - Hydrologist (Medical): No    Lack of Transportation (Non-Medical): No  Physical Activity: Inactive (05/25/2021)   Exercise Vital Sign    Days of Exercise per Week: 0 days    Minutes of  Exercise per Session: 0 min  Stress: No Stress Concern Present (02/25/2021)   [place]    Feeling of Stress : Only a little  Social Connections: Socially Integrated (02/25/2021)   Social Connection and Isolation Panel [NHANES]    Frequency of Communication with Friends and Family: More than three times a week    Frequency of Social Gatherings with Friends and Family: More than three times a week  Attends Religious Services: More than 4 times per year    Active Member of Clubs or Organizations: Yes    Attends Archivist Meetings: More than 4 times per year    Marital Status: Married    Review of Systems CONSTITUTIONAL: see HPI E/N/T: Negative for ear pain, nasal congestion and sore throat.  CARDIOVASCULAR: Negative for chest pain, dizziness, palpitations and pedal edema.  RESPIRATORY: Negative for recent cough and dyspnea.  GASTROINTESTINAL: Negative for abdominal pain, acid reflux symptoms, constipation, diarrhea, nausea and vomiting.  GU - negative MSK: Negative for arthralgias and myalgias.  INTEGUMENTARY: Negative for rash.  NEUROLOGICAL: Negative for dizziness and headaches.  PSYCHIATRIC: Negative for sleep disturbance and to question depression screen.  Negative for depression, negative for anhedonia.       Objective:  PHYSICAL EXAM:   VS: BP 108/68 (BP Location: Left Arm, Patient Position: Sitting, Cuff Size: Normal)   Pulse (!) 57   Temp (!) 97.3 F (36.3 C) (Temporal)   Ht '5\' 5"'$  (1.651 m)   Wt 128 lb 6.4 oz (58.2 kg)   SpO2 97%   BMI 21.37 kg/m   GEN: Well nourished, well developed, in no acute distress   Cardiac: RRR; no murmurs, rubs, or gallops,no edema - Respiratory:  normal respiratory rate and pattern with no distress - normal breath sounds with no rales, rhonchi, wheezes or rubs  MS: no deformity or atrophy  Skin: warm and dry, no rash   Psych: euthymic mood, appropriate  affect and demeanor  Office Visit on 05/02/2022  Component Date Value Ref Range Status   Color, UA 05/02/2022 yellow  yellow Final   Clarity, UA 05/02/2022 clear  clear Final   Glucose, UA 05/02/2022 negative  negative mg/dL Final   Bilirubin, UA 05/02/2022 negative  negative Final   Ketones, POC UA 05/02/2022 negative  negative mg/dL Final   Spec Grav, UA 05/02/2022 1.015  1.010 - 1.025 Final   Blood, UA 05/02/2022 moderate (A)  negative Final   pH, UA 05/02/2022 5.0  5.0 - 8.0 Final   POC PROTEIN,UA 05/02/2022 negative  negative, trace Final   Urobilinogen, UA 05/02/2022 0.2  0.2 or 1.0 E.U./dL Final   Nitrite, UA 05/02/2022 Negative  Negative Final   Leukocytes, UA 05/02/2022 Negative  Negative Final    Lab Results  Component Value Date   WBC 6.4 03/07/2022   HGB 11.3 03/07/2022   HCT 35.8 03/07/2022   PLT 417 03/07/2022   GLUCOSE 104 (H) 02/23/2022   CHOL 137 12/17/2021   TRIG 69 12/17/2021   HDL 66 12/17/2021   LDLCALC 57 12/17/2021   ALT 14 01/24/2022   AST 21 01/24/2022   NA 139 02/23/2022   K 4.6 02/23/2022   CL 100 02/23/2022   CREATININE 0.72 02/23/2022   BUN 14 02/23/2022   CO2 26 02/23/2022   TSH 0.591 04/06/2022   INR 0.99 07/16/2009   HGBA1C  07/15/2009    5.1 (NOTE) The ADA recommends the following therapeutic goal for glycemic control related to Hgb A1c measurement: Goal of therapy: <6.5 Hgb A1c  Reference: American Diabetes Association: Clinical Practice Recommendations 2010, Diabetes Care, 2010, 33: (Suppl  1).      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  No orders of the defined types were placed in this encounter.   Orders Placed This Encounter  Procedures   CBC with Differential/Platelet   Comprehensive metabolic panel   Lipid panel   TSH   VITAMIN D 25 Hydroxy (Vit-D Deficiency, Fractures)   Ambulatory referral to Urology   POCT URINALYSIS DIP (CLINITEK)     Follow-up: Return in about 4 months (around 09/02/2022) for chronic fasting follow up.  An After Visit Summary was printed and given to the patient.  Yetta Flock [facility] [phone]

## 2022-05-03 LAB — TSH: TSH: 1.15 u[IU]/mL (ref 0.450–4.500)

## 2022-05-03 LAB — CBC WITH DIFFERENTIAL/PLATELET
Basophils Absolute: 0.1 10*3/uL (ref 0.0–0.2)
Basos: 1 %
EOS (ABSOLUTE): 0.1 10*3/uL (ref 0.0–0.4)
Eos: 2 %
Hematocrit: 39.8 % (ref 34.0–46.6)
Hemoglobin: 13.2 g/dL (ref 11.1–15.9)
Immature Grans (Abs): 0 10*3/uL (ref 0.0–0.1)
Immature Granulocytes: 0 %
Lymphocytes Absolute: 1 10*3/uL (ref 0.7–3.1)
Lymphs: 17 %
MCH: 30.3 pg (ref 26.6–33.0)
MCHC: 33.2 g/dL (ref 31.5–35.7)
MCV: 91 fL (ref 79–97)
Monocytes Absolute: 0.6 10*3/uL (ref 0.1–0.9)
Monocytes: 10 %
Neutrophils Absolute: 4.4 10*3/uL (ref 1.4–7.0)
Neutrophils: 70 %
Platelets: 230 10*3/uL (ref 150–450)
RBC: 4.36 x10E6/uL (ref 3.77–5.28)
RDW: 14.1 % (ref 11.7–15.4)
WBC: 6.2 10*3/uL (ref 3.4–10.8)

## 2022-05-03 LAB — COMPREHENSIVE METABOLIC PANEL
ALT: 14 IU/L (ref 0–32)
AST: 25 IU/L (ref 0–40)
Albumin/Globulin Ratio: 2 (ref 1.2–2.2)
Albumin: 4.3 g/dL (ref 3.9–4.9)
Alkaline Phosphatase: 61 IU/L (ref 44–121)
BUN/Creatinine Ratio: 15 (ref 12–28)
BUN: 13 mg/dL (ref 8–27)
Bilirubin Total: 0.4 mg/dL (ref 0.0–1.2)
CO2: 23 mmol/L (ref 20–29)
Calcium: 10 mg/dL (ref 8.7–10.3)
Chloride: 103 mmol/L (ref 96–106)
Creatinine, Ser: 0.85 mg/dL (ref 0.57–1.00)
Globulin, Total: 2.2 g/dL (ref 1.5–4.5)
Glucose: 92 mg/dL (ref 70–99)
Potassium: 5.5 mmol/L — ABNORMAL HIGH (ref 3.5–5.2)
Sodium: 140 mmol/L (ref 134–144)
Total Protein: 6.5 g/dL (ref 6.0–8.5)
eGFR: 74 mL/min/{1.73_m2} (ref >59)

## 2022-05-03 LAB — LIPID PANEL
Chol/HDL Ratio: 2 ratio (ref 0.0–4.4)
Cholesterol, Total: 160 mg/dL (ref 100–199)
HDL: 79 mg/dL (ref >39)
LDL Chol Calc (NIH): 68 mg/dL (ref 0–99)
Triglycerides: 66 mg/dL (ref 0–149)
VLDL Cholesterol Cal: 13 mg/dL (ref 5–40)

## 2022-05-03 LAB — CARDIOVASCULAR RISK ASSESSMENT

## 2022-05-03 LAB — VITAMIN D 25 HYDROXY (VIT D DEFICIENCY, FRACTURES): Vit D, 25-Hydroxy: 56.1 ng/mL (ref 30.0–100.0)

## 2022-05-04 ENCOUNTER — Other Ambulatory Visit: Payer: Self-pay | Admitting: Physician Assistant

## 2022-05-04 DIAGNOSIS — R899 Unspecified abnormal finding in specimens from other organs, systems and tissues: Secondary | ICD-10-CM

## 2022-05-12 ENCOUNTER — Other Ambulatory Visit: Payer: Self-pay | Admitting: Physician Assistant

## 2022-05-19 DIAGNOSIS — B3731 Acute candidiasis of vulva and vagina: Secondary | ICD-10-CM | POA: Diagnosis not present

## 2022-05-19 DIAGNOSIS — R3129 Other microscopic hematuria: Secondary | ICD-10-CM | POA: Diagnosis not present

## 2022-05-19 DIAGNOSIS — Z79899 Other long term (current) drug therapy: Secondary | ICD-10-CM | POA: Diagnosis not present

## 2022-05-24 ENCOUNTER — Inpatient Hospital Stay: Payer: Medicare Other | Attending: Hematology and Oncology

## 2022-05-24 DIAGNOSIS — Z95828 Presence of other vascular implants and grafts: Secondary | ICD-10-CM

## 2022-05-24 DIAGNOSIS — Z452 Encounter for adjustment and management of vascular access device: Secondary | ICD-10-CM | POA: Insufficient documentation

## 2022-05-24 DIAGNOSIS — C060 Malignant neoplasm of cheek mucosa: Secondary | ICD-10-CM | POA: Diagnosis not present

## 2022-05-24 MED ORDER — SODIUM CHLORIDE 0.9% FLUSH
10.0000 mL | Freq: Once | INTRAVENOUS | Status: AC
  Administered 2022-05-24: 10 mL

## 2022-05-24 MED ORDER — HEPARIN SOD (PORK) LOCK FLUSH 100 UNIT/ML IV SOLN
500.0000 [IU] | Freq: Once | INTRAVENOUS | Status: AC
  Administered 2022-05-24: 500 [IU]

## 2022-05-25 DIAGNOSIS — R3129 Other microscopic hematuria: Secondary | ICD-10-CM | POA: Diagnosis not present

## 2022-05-25 DIAGNOSIS — R319 Hematuria, unspecified: Secondary | ICD-10-CM | POA: Diagnosis not present

## 2022-05-26 ENCOUNTER — Ambulatory Visit (INDEPENDENT_AMBULATORY_CARE_PROVIDER_SITE_OTHER): Payer: Medicare Other

## 2022-05-26 VITALS — BP 108/60 | HR 57 | Resp 16 | Ht 65.0 in | Wt 130.8 lb

## 2022-05-26 DIAGNOSIS — R899 Unspecified abnormal finding in specimens from other organs, systems and tissues: Secondary | ICD-10-CM | POA: Diagnosis not present

## 2022-05-26 DIAGNOSIS — Z1231 Encounter for screening mammogram for malignant neoplasm of breast: Secondary | ICD-10-CM

## 2022-05-26 DIAGNOSIS — Z23 Encounter for immunization: Secondary | ICD-10-CM | POA: Diagnosis not present

## 2022-05-26 DIAGNOSIS — Z0000 Encounter for general adult medical examination without abnormal findings: Secondary | ICD-10-CM

## 2022-05-26 LAB — COMPREHENSIVE METABOLIC PANEL
ALT: 11 IU/L (ref 0–32)
AST: 23 IU/L (ref 0–40)
Albumin/Globulin Ratio: 1.9 (ref 1.2–2.2)
Albumin: 4 g/dL (ref 3.9–4.9)
Alkaline Phosphatase: 55 IU/L (ref 44–121)
BUN/Creatinine Ratio: 21 (ref 12–28)
BUN: 19 mg/dL (ref 8–27)
Bilirubin Total: <0.2 mg/dL (ref 0.0–1.2)
CO2: 24 mmol/L (ref 20–29)
Calcium: 9.9 mg/dL (ref 8.7–10.3)
Chloride: 103 mmol/L (ref 96–106)
Creatinine, Ser: 0.9 mg/dL (ref 0.57–1.00)
Globulin, Total: 2.1 g/dL (ref 1.5–4.5)
Glucose: 90 mg/dL (ref 70–99)
Potassium: 5.7 mmol/L — ABNORMAL HIGH (ref 3.5–5.2)
Sodium: 141 mmol/L (ref 134–144)
Total Protein: 6.1 g/dL (ref 6.0–8.5)
eGFR: 69 mL/min/{1.73_m2} (ref >59)

## 2022-05-27 ENCOUNTER — Other Ambulatory Visit: Payer: Self-pay | Admitting: Physician Assistant

## 2022-05-27 DIAGNOSIS — R899 Unspecified abnormal finding in specimens from other organs, systems and tissues: Secondary | ICD-10-CM

## 2022-05-30 NOTE — Patient Instructions (Signed)
Health Maintenance After Age 69 After age 69, you are at a higher risk for certain long-term diseases and infections as well as injuries from falls. Falls are a major cause of broken bones and head injuries in people who are older than age 69. Getting regular preventive care can help to keep you healthy and well. Preventive care includes getting regular testing and making lifestyle changes as recommended by your health care provider. Talk with your health care provider about: Which screenings and tests you should have. A screening is a test that checks for a disease when you have no symptoms. A diet and exercise plan that is right for you. What should I know about screenings and tests to prevent falls? Screening and testing are the best ways to find a health problem early. Early diagnosis and treatment give you the best chance of managing medical conditions that are common after age 69. Certain conditions and lifestyle choices may make you more likely to have a fall. Your health care provider may recommend: Regular vision checks. Poor vision and conditions such as cataracts can make you more likely to have a fall. If you wear glasses, make sure to get your prescription updated if your vision changes. Medicine review. Work with your health care provider to regularly review all of the medicines you are taking, including over-the-counter medicines. Ask your health care provider about any side effects that may make you more likely to have a fall. Tell your health care provider if any medicines that you take make you feel dizzy or sleepy. Strength and balance checks. Your health care provider may recommend certain tests to check your strength and balance while standing, walking, or changing positions. Foot health exam. Foot pain and numbness, as well as not wearing proper footwear, can make you more likely to have a fall. Screenings, including: Osteoporosis screening. Osteoporosis is a condition that causes  the bones to get weaker and break more easily. Blood pressure screening. Blood pressure changes and medicines to control blood pressure can make you feel dizzy. Depression screening. You may be more likely to have a fall if you have a fear of falling, feel depressed, or feel unable to do activities that you used to do. Alcohol use screening. Using too much alcohol can affect your balance and may make you more likely to have a fall. Follow these instructions at home: Lifestyle Do not drink alcohol if: Your health care provider tells you not to drink. If you drink alcohol: Limit how much you have to: 0-1 drink a day for women. 0-2 drinks a day for men. Know how much alcohol is in your drink. In the [place] one drink equals one 12 oz bottle of beer (355 mL), one 5 oz glass of wine (148 mL), or one 1 oz glass of hard liquor (44 mL). Do not use any products that contain nicotine or tobacco. These products include cigarettes, chewing tobacco, and vaping devices, such as e-cigarettes. If you need help quitting, ask your health care provider. Activity  Follow a regular exercise program to stay fit. This will help you maintain your balance. Ask your health care provider what types of exercise are appropriate for you. If you need a cane or walker, use it as recommended by your health care provider. Wear supportive shoes that have nonskid soles. Safety  Remove any tripping hazards, such as rugs, cords, and clutter. Install safety equipment such as grab bars in bathrooms and safety rails on stairs. Keep rooms and walkways   well-lit. General instructions Talk with your health care provider about your risks for falling. Tell your health care provider if: You fall. Be sure to tell your health care provider about all falls, even ones that seem minor. You feel dizzy, tiredness (fatigue), or off-balance. Take over-the-counter and prescription medicines only as told by your health care provider. These include  supplements. Eat a healthy diet and maintain a healthy weight. A healthy diet includes low-fat dairy products, low-fat (lean) meats, and fiber from whole grains, beans, and lots of fruits and vegetables. Stay current with your vaccines. Schedule regular health, dental, and eye exams. Summary Having a healthy lifestyle and getting preventive care can help to protect your health and wellness after age 69. Screening and testing are the best way to find a health problem early and help you avoid having a fall. Early diagnosis and treatment give you the best chance for managing medical conditions that are more common for people who are older than age 69. Falls are a major cause of broken bones and head injuries in people who are older than age 69. Take precautions to prevent a fall at home. Work with your health care provider to learn what changes you can make to improve your health and wellness and to prevent falls. This information is not intended to replace advice given to you by your health care provider. Make sure you discuss any questions you have with your health care provider. Document Revised: 11/16/2020 Document Reviewed: 11/16/2020 Elsevier Patient Education  2023 Elsevier Inc.  

## 2022-05-30 NOTE — Progress Notes (Signed)
Subjective:   Tammy Brady is a 71 y.o. female who presents for Medicare Annual (Subsequent) preventive examination.  This wellness visit is conducted by a nurse.  The patient's medications were reviewed and reconciled since the patient's last visit.  History details were provided by the patient.  The history appears to be reliable.    Medical History: Patient history and Family history was reviewed  Medications, Allergies, and preventative health maintenance was reviewed and updated.  Cardiac Risk Factors include: advanced age (>52mn, >>51women)     Objective:    Today's Vitals   05/26/22 1054  BP: 108/60  Pulse: (!) 57  Resp: 16  Weight: 130 lb 12.8 oz (59.3 kg)  Height: '5\' 5"'$  (1.651 m)   Body mass index is 21.77 kg/m.     01/25/2022    3:08 PM 05/25/2021    3:30 PM 05/17/2021    4:30 AM 05/07/2021   11:05 AM 03/31/2021    9:33 AM 03/31/2021    8:50 AM 03/11/2021    8:13 AM  Advanced Directives  Does Patient Have a Medical Advance Directive? Yes Yes No Yes Yes Yes Yes  Type of AParamed[place]of [place] will Healthcare Power of [place]am[place]Living will  HWarner RobinsLiving will HPoplar BluffLiving will  Does patient want to make changes to medical advance directive? No - Patient declined     No - Patient declined No - Patient declined  Copy of HSt. [place]in Chart? No - copy requested No - copy requested   No - copy requested No - copy requested No - copy requested  Would patient like information on creating a medical advance directive?   No - Patient declined        Current Medications (verified) Outpatient Encounter Medications as of 05/26/2022  Medication Sig   acetaminophen (TYLENOL) 500 MG tablet Take 500 mg by mouth every 6 (six) hours as needed for mild pain.   aspirin EC 81 MG tablet Take 1 tablet (81 mg total) by mouth daily. Swallow whole.   calcium carbonate  (OS-CAL) 600 MG TABS Take 600 mg by mouth daily.   diltiazem (CARDIZEM CD) 300 MG 24 hr capsule TAKE 1 CAPSULE BY MOUTH DAILY   fluconazole (DIFLUCAN) 150 MG tablet Take by mouth.   fluticasone (FLOVENT HFA) 110 MCG/ACT inhaler Inhale 2 puffs into the lungs 2 (two) times daily.   levothyroxine (SYNTHROID) 88 MCG tablet Take 1 tablet (88 mcg total) by mouth daily before breakfast.   nitroGLYCERIN (NITROSTAT) 0.4 MG SL tablet Place 1 tablet (0.4 mg total) under the tongue every 5 (five) minutes as needed.   simvastatin (ZOCOR) 40 MG tablet TAKE 1 TABLET BY MOUTH AT  BEDTIME   sodium fluoride (SODIUM FLUORIDE 5000 PPM) 1.1 % GEL dental gel Place 1 pea-size drop into each tooth space of fluoride trays once a day at bedtime.  Leave trays in for 5 minutes and then remove.  Spit out excess fluoride, but DO NOT rinse with water, eat or drink for at least 30 minutes after use.   traZODone (DESYREL) 50 MG tablet TAKE 1 TABLET BY MOUTH AT  BEDTIME   No facility-administered encounter medications on file as of 05/26/2022.    Allergies (verified) Codeine and Oxycodone   History: Past Medical History:  Diagnosis Date   Aortic atherosclerosis (HCC)    CAD (coronary artery disease)    Mild non-obstructive disease by  cath January 2011. Coronary CTA showed  mild (25-49%) plaque in the RCA and minimal (<25%) coronary CTA 04/2021 with coronary Ca score 257.  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   Past Surgical History:  Procedure Laterality Date   APPENDECTOMY     EXCISION ORAL TUMOR  01/18/2021   see details [facility] Everywhere   IR IMAGING GUIDED PORT INSERTION  03/02/2021    Family History  Problem Relation Age of Onset   Cancer Mother        esophageal   Heart disease Father    Coronary artery disease Father    Hyperlipidemia Sister    Hyperlipidemia Sister    Hyperlipidemia Brother    Hyperlipidemia Brother    Hyperlipidemia Brother    Coronary artery disease Maternal Grandmother    Social History   Socioeconomic History   Marital status: Married    Spouse name: [name]   Number of children: 1   Years of education: Not on file   Highest education level: Not on file  Occupational History   Occupation: Field seismologist: [employer]  Tobacco Use   Smoking status: Former    Packs/day: 1.00    Years: 40.00    Total pack years: 40.00    Types: Cigarettes    Quit date: 05/11/2009    Years since quitting: 13.0   Smokeless tobacco: Never  Vaping Use   Vaping Use: Never used  Substance and Sexual Activity   Alcohol use: Not Currently   Drug use: No   Sexual activity: Not Currently  Other Topics Concern   Not on file  Social History Narrative   Not on file   Social Determinants of Health   Financial Resource Strain: Low Risk  (06/22/2021)   Overall Financial Resource Strain (CARDIA)    Difficulty of Paying Living Expenses: Not hard at all  Food Insecurity: No Food Insecurity (02/25/2021)   Hunger Vital Sign    Worried About Running Out of Food in the Last Year: Never true    Ran Out of Food in the Last Year: Never true  Transportation Needs: No Transportation Needs (06/22/2021)   PRAPARE - Hydrologist (Medical): No    Lack of Transportation (Non-Medical): No  Physical Activity: Inactive (05/25/2021)   Exercise Vital Sign    Days of Exercise per Week: 0 days    Minutes of Exercise per Session: 0 min  Stress: No Stress Concern Present (02/25/2021)   [place]    Feeling of Stress : Only a little  Social Connections: Socially  Integrated (02/25/2021)   Social Connection and Isolation Panel [NHANES]    Frequency of Communication with Friends and Family: More than three times a week    Frequency of Social Gatherings with Friends and Family: More than three times a week    Attends Religious Services: More than 4 times per year    Active Member of [employer] or Organizations: Yes    Attends Archivist Meetings: More than 4  times per year    Marital Status: Married    Tobacco Counseling Counseling given: Not Answered   Clinical Intake:     Pain : No/denies pain     BMI - recorded: 21.77 Nutritional Status: BMI of 19-24  Normal Nutritional Risks: Unintentional weight loss Diabetes: No  How often do you need to have someone help you when you read instructions, pamphlets, or other written materials from your doctor or pharmacy?: 1 - Never Interpreter Needed?: No    Activities of Daily Living    05/26/2022   11:06 AM 05/23/2022   11:31 AM  In your present state of health, do you have any difficulty performing the following activities:  Hearing? 0 0  Vision? 0 0  Difficulty concentrating or making decisions? 0 0  Walking or climbing stairs? 0 0  Dressing or bathing? 0 0  Doing errands, shopping? 0 0  Preparing Food and eating ? N N  Using the Toilet? N N  In the past six months, have you accidently leaked urine? N N  Do you have problems with loss of bowel control? N N  Managing your Medications? N N  Managing your Finances? N N  Housekeeping or managing your Housekeeping? N N    Patient Care Team: Marge Duncans, Hershal Coria as PCP - General (Physician Assistant) Sueanne Margarita, MD as PCP - Cardiology (Cardiology) Malmfelt, Stephani Police, RN as Oncology Nurse Navigator Eppie Gibson, MD as Consulting Physician (Radiation Oncology) Benay Pike, MD as Consulting Physician (Hematology and Oncology) Lane Hacker, [facility] as Pharmacist (Pharmacist)     Assessment:   This is a routine wellness  examination for [employer].  Hearing/Vision screen No results found.  Dietary issues and exercise activities discussed: Current Exercise Habits: Home exercise routine;Structured exercise class, Type of exercise: stretching;walking, Time (Minutes): 40, Frequency (Times/Week): 4, Weekly Exercise (Minutes/Week): 160, Intensity: Mild, Exercise limited by: None identified   Depression Screen    05/30/2022   11:05 AM 05/25/2021   10:38 AM  PHQ 2/9 Scores  PHQ - 2 Score 1 1    Fall Risk    05/26/2022   11:04 AM 05/23/2022   11:31 AM 07/26/2021    8:54 AM  Fall Risk   Falls in the past year? 1 0 0  Number falls in past yr: 0 0 0  Injury with Fall? 0 0 0  Risk for fall due to : No Fall Risks  No Fall Risks  Follow up Falls evaluation completed;Education provided  Falls evaluation completed    [place]:  Any stairs in or around the home? Yes  If so, are there any without handrails? No  Home free of loose throw rugs in walkways, pet beds, electrical cords, etc? Yes  Adequate lighting in your home to reduce risk of falls? Yes   ASSISTIVE DEVICES UTILIZED TO PREVENT FALLS:  Life alert? No  Use of a cane, walker or w/c? No   Gait steady and fast without use of assistive device  Cognitive Function:        05/30/2022   11:06 AM  6CIT Screen  What Year? 0 points  What month? 0 points  What time? 0 points  Count back from 20 0 points  Months in reverse 0 points  Repeat phrase 0 points  Total Score 0 points    Immunizations Immunization History  Administered Date(s) Administered   Fluad Quad(high Dose 65+) 07/30/2020, 05/25/2021   Moderna Sars-Covid-2 Vaccination 09/05/2019,  10/03/2019   Pfizer Covid-19 Vaccine Bivalent Booster 14yr & up 05/25/2021   Pneumococcal Conjugate-13 06/25/2018   Pneumococcal Polysaccharide-23 07/01/2019    TDAP status: Due, Education has been provided regarding the importance of this vaccine. Advised may  receive this vaccine at local pharmacy or Health Dept. Aware to provide a copy of the vaccination record if obtained from local pharmacy or Health Dept. Verbalized acceptance and understanding.  Flu Vaccine status: Completed at today's visit  Pneumococcal vaccine status: Up to date  Covid-19 vaccine status: Declined, Education has been provided regarding the importance of this vaccine but patient still declined. Advised may receive this vaccine at local pharmacy or Health Dept.or vaccine clinic. Aware to provide a copy of the vaccination record if obtained from local pharmacy or Health Dept. Verbalized acceptance and understanding.  Qualifies for Shingles Vaccine? Yes   Zostavax completed No   Shingrix Completed?: No.    Education has been provided regarding the importance of this vaccine. Patient has been advised to call insurance company to determine out of pocket expense if they have not yet received this vaccine. Advised may also receive vaccine at local pharmacy or Health Dept. Verbalized acceptance and understanding.  Screening Tests Health Maintenance  Topic Date Due   Medicare Annual Wellness (AWV)  05/25/2022   INFLUENZA VACCINE  06/12/2023 (Originally 02/08/2022)   Lung Cancer Screening  04/08/2023   MAMMOGRAM  09/02/2023   COLONOSCOPY (Pts 45-460yrInsurance coverage will need to be confirmed)  04/09/2028   Pneumonia Vaccine 6549Years old  Completed   DEXA SCAN  Completed   HPV VACCINES  Aged Out   COVID-19 Vaccine  Discontinued   Hepatitis C Screening  Discontinued   Zoster Vaccines- Shingrix  Discontinued    Health Maintenance  Health Maintenance Due  Topic Date Due   Medicare Annual Wellness (AWV)  05/25/2022    Colorectal cancer screening: Type of screening: Colonoscopy. Completed 04/09/18. Repeat every 10 years  Mammogram status: Completed 09/01/21. Repeat every year  Bone Density status: Completed 09/01/21. Results reflect: Bone density results: NORMAL. Repeat every  2 years.  Lung Cancer Screening: (Low Dose CT Chest recommended if Age 69-80ears, 30 pack-year currently smoking OR have quit w/in 15years.) does not qualify.   Lung Cancer Screening Referral: N/A  Additional Screening:  Hepatitis C Screening: does qualify; Completed Never  Vision Screening: Recommended annual ophthalmology exams for early detection of glaucoma and other disorders of the eye. Is the patient up to date with their annual eye exam?  Yes   Dental Screening: Recommended annual dental exams for proper oral hygiene  Community Resource Referral / Chronic Care Management: CRR required this visit?  No   CCM required this visit?  No      Plan:    1- Mammogram ordered to be done in February  I have personally reviewed and noted the following in the patient's chart:   Medical and social history Use of alcohol, tobacco or illicit drugs  Current medications and supplements including opioid prescriptions.  Functional ability and status Nutritional status Physical activity Advanced directives List of other physicians Hospitalizations, surgeries, and ER visits in previous 12 months Vitals Screenings to include cognitive, depression, and falls Referrals and appointments  In addition, I have reviewed and discussed with patient certain preventive protocols, quality metrics, and best practice recommendations. A written personalized care plan for preventive services as well as general preventive health recommendations were provided to patient.     KiErie NoeLPN  05/30/2022      

## 2022-05-31 ENCOUNTER — Other Ambulatory Visit: Payer: Self-pay | Admitting: Physician Assistant

## 2022-05-31 DIAGNOSIS — E038 Other specified hypothyroidism: Secondary | ICD-10-CM

## 2022-06-09 DIAGNOSIS — R3129 Other microscopic hematuria: Secondary | ICD-10-CM | POA: Diagnosis not present

## 2022-06-18 DIAGNOSIS — J029 Acute pharyngitis, unspecified: Secondary | ICD-10-CM | POA: Diagnosis not present

## 2022-06-21 ENCOUNTER — Ambulatory Visit: Payer: Medicare Other | Admitting: Physician Assistant

## 2022-07-01 ENCOUNTER — Telehealth: Payer: Self-pay | Admitting: Hematology and Oncology

## 2022-07-01 NOTE — Telephone Encounter (Signed)
Called patient to notify of new appointment time/date. Patient notified of new time/date.

## 2022-07-12 NOTE — Therapy (Signed)
[facility] [address], [place], [place], [address] Phone: [phone]   Fax:  [phone]  Patient Details  Name: Tammy Brady MRN: [id number] Date of Birth: 1950/09/15 Referring Provider:  Eppie Gibson, MD  Encounter Date: 07/12/2022   SPEECH THERAPY DISCHARGE SUMMARY  Visits from [facility] Care: 5  Current functional level related to goals / functional outcomes: Pt's progress after her last scheduled and attended therapy session in March 2023 was as follows; She was followed approx every 8 weeks but cancelled her scheduled session on 11-02-21 and never rescheduled. At this point it is assumed that pt no longer desires ST services.  SLP Short Term Goals - 07/13/21 1053                SLP SHORT TERM GOAL #1    Title pt will complete HEP with rare min A     Time 1     Period --   visit, for all STGs    Status Not Met   andcontinued         SLP SHORT TERM GOAL #2    Title pt will tell SLP why pt is completing HEP with modified independence     Time 1     Status Partially Met          SLP SHORT TERM GOAL #3    Title pt will describe 3 overt s/s aspiration PNA with modified independence     Status Achieved          SLP SHORT TERM GOAL #4    Title pt will tell SLP how a food journal could hasten return to a more normalized diet     Status Achieved                     SLP Long Term Goals - 09/08/21 1311                SLP LONG TERM GOAL #1    Title pt will complete HEP with modified independence over three visits     Baseline 07-13-21, 09-08-21     Time 3     Period --   visits, for all LTGs    Status Revised   renewed 06-11-21    Target Date 12/07/21          SLP LONG TERM GOAL #2    Title pt will describe how to modify HEP over time, and the timeline associated with reduction in HEP frequency with modified independence over two sessions     Baseline 09-08-21     Time 3     Status On-going   renewed 06-11-21    Target  Date 12/07/21                     Plan - 09/08/21 1307       Clinical Impression Statement At this time pt cont to exhibit at least cont'd oral stage dysphagia due mainly to surgical changes with mandible. [name]'s swallowing is deemed WNL/WFL with water and puree although pt could likely tolerate dys II and III items as well. She reports eating a wider variety of foods from dys I-III. She has not used PEG in two weeks, and has maintained weight about 147-148lbs for 7 days. SLP reviewed pt's individualized HEP for dysphagia- pt cont'd independent.  Data indicate that pt's swallow ability will likely decrease over the course of radiation therapy  and could very well decline over time following conclusion of their radiation therapy due to muscle disuse atrophy and/or muscle fibrosis. Pt to have flap revision sx in mid-May and she will cont to need to be seen by SLP in order to assess safety of PO intake, assess the need for recommending any objective swallow assessment, and ensuring pt correctly completes the individualized HEP.         Remaining deficits: Deficits likely remain.   Education / Equipment: HEP procedure, aspiration PNA s/sx. See therapy notes for more details.    Patient agrees to discharge. Patient goals were partially met. Patient is being discharged due to not returning since the last visit.Marland Kitchen     [place], [place] 07/12/2022, 9:06 AM  [facility] [place] [address], [place], [place], [address] Phone: [phone]   Fax:  [phone]

## 2022-07-20 ENCOUNTER — Inpatient Hospital Stay: Payer: Medicare Other

## 2022-07-20 ENCOUNTER — Ambulatory Visit: Payer: Medicare Other | Admitting: Hematology and Oncology

## 2022-07-22 ENCOUNTER — Encounter: Payer: Self-pay | Admitting: Hematology and Oncology

## 2022-07-22 ENCOUNTER — Inpatient Hospital Stay: Payer: Medicare Other | Attending: Hematology and Oncology

## 2022-07-22 ENCOUNTER — Inpatient Hospital Stay (HOSPITAL_BASED_OUTPATIENT_CLINIC_OR_DEPARTMENT_OTHER): Payer: Medicare Other | Admitting: Hematology and Oncology

## 2022-07-22 VITALS — BP 131/57 | HR 62 | Temp 98.1°F | Resp 16 | Ht 65.0 in | Wt 132.0 lb

## 2022-07-22 DIAGNOSIS — C060 Malignant neoplasm of cheek mucosa: Secondary | ICD-10-CM | POA: Diagnosis not present

## 2022-07-22 DIAGNOSIS — Z923 Personal history of irradiation: Secondary | ICD-10-CM | POA: Diagnosis not present

## 2022-07-22 DIAGNOSIS — Z95828 Presence of other vascular implants and grafts: Secondary | ICD-10-CM

## 2022-07-22 DIAGNOSIS — Z9221 Personal history of antineoplastic chemotherapy: Secondary | ICD-10-CM | POA: Insufficient documentation

## 2022-07-22 DIAGNOSIS — Z452 Encounter for adjustment and management of vascular access device: Secondary | ICD-10-CM | POA: Diagnosis not present

## 2022-07-22 DIAGNOSIS — C069 Malignant neoplasm of mouth, unspecified: Secondary | ICD-10-CM

## 2022-07-22 DIAGNOSIS — Z87891 Personal history of nicotine dependence: Secondary | ICD-10-CM | POA: Insufficient documentation

## 2022-07-22 MED ORDER — SODIUM CHLORIDE 0.9% FLUSH
10.0000 mL | Freq: Once | INTRAVENOUS | Status: AC
  Administered 2022-07-22: 10 mL

## 2022-07-22 MED ORDER — HEPARIN SOD (PORK) LOCK FLUSH 100 UNIT/ML IV SOLN
500.0000 [IU] | Freq: Once | INTRAVENOUS | Status: AC
  Administered 2022-07-22: 500 [IU]

## 2022-07-22 NOTE — Assessment & Plan Note (Signed)
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  Her pathologic staging is pT3 N3B.  Given positive margins and presence of extracapsular extension,she completed 6 weeks of chemoradiation. She is here for her annual follow-up.  Since her last visit, followed up with ENT in October, no concern for recurrence.  She denies any new symptoms, she at this time continues to recover with no concerns.  Physical examination unremarkable, graft appears healthy.  Oral exam without any concerns.  Her primary care doctor has been managing her hypothyroidism and she is on levothyroxine.  She will return to clinic in 1 year with medical oncology or sooner as needed.

## 2022-07-22 NOTE — Progress Notes (Signed)
[facility] Follow up:    Tammy Duncans, PA-C [address] 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  Her pathologic staging is pT3 N3B.   02/19/2021 Cancer Staging   Staging form: Oral Cavity, AJCC 8th Edition - Pathologic stage from 02/19/2021: Stage IVB (pT3, pN3b, cM0) - Signed by Eppie Gibson, MD on 02/20/2021 Stage prefix: Initial diagnosis   03/08/2021 - 04/19/2021 Radiation Therapy   Radiation therapy given concurrently with weekly Cisplatin   03/10/2021 - 04/15/2021 Chemotherapy   Patient is on Treatment Plan :  HEAD/NECK Cisplatin q7d  x 6 cycles     CURRENT THERAPY: s/p chemoradiation  INTERVAL HISTORY:  Tammy Brady 73 y.o. female returns for evaluation and follow-up of her head neck cancer diagnoses. She saw Dr. Amada Jupiter at [facility], and they noted no sign of cancer recurrence.  She has been doing well. She feels well. She has been eating most soft woods, hard to chew meat. She lost a bunch of weight but she is now stable at around 130 lbs. She decided not to proceed with dental implants. She sometimes sensitive in her mouth, this sensation waxes and wanes, not bothersome today. She is now back to the [facility],  She is taking her  thyroid medication. PCP manages the thyroid medication. Rest of the pertinent 10 point ROS reviewed and negative.  Patient Active Problem List   Diagnosis Date Noted   Malaise 02/28/2022   Hematuria, microscopic 02/28/2022   Tick bite of abdomen 02/28/2022   Weight loss 02/07/2022   New daily persistent headache 01/19/2022   Vitamin D insufficiency 12/20/2021   Centrilobular emphysema ([place]) 12/20/2021   History of radiation to head and neck region 06/25/2021   Encounter for dental examination 06/25/2021   Xerostomia due to radiotherapy 06/25/2021   Dysgeusia 06/25/2021   Trismus 06/25/2021   Encounter for general adult medical examination with abnormal findings 06/06/2021   Encounter for osteoporosis screening in asymptomatic postmenopausal patient 06/06/2021   Visit for screening mammogram 06/06/2021   Need for immunization against influenza 06/06/2021   Symptomatic anemia 05/18/2021   CAP (community acquired pneumonia) 05/17/2021   Normocytic anemia 05/17/2021   Moderate protein-calorie malnutrition ([place]) 05/17/2021   Hyponatremia 05/17/2021   G tube feedings ([place]) 04/29/2021   Aortic atherosclerosis ([place]) 04/25/2021   Chest pain of uncertain etiology 85/63/1497   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 chemotherapy ([place]) 04/07/2021   Mucositis due to antineoplastic therapy 03/31/2021   Drug induced constipation 03/31/2021   Chemotherapy-induced nausea 03/31/2021   Port-A-Cath in place 03/09/2021   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   Carcinoma of lower gum ([place]) 02/20/2021   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  MEDICAL HISTORY: Past Medical History:  Diagnosis Date   Aortic atherosclerosis ([place])    CAD (coronary artery disease)    Mild non-obstructive disease by cath January 2011. Coronary CTA showed  mild (25-49%) plaque in the RCA and minimal (<25%) coronary CTA 04/2021 with coronary Ca score 257.  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    SURGICAL HISTORY: Past Surgical History:  Procedure Laterality Date   APPENDECTOMY     EXCISION ORAL TUMOR  01/18/2021   see details [facility] Care Everywhere   IR IMAGING GUIDED PORT INSERTION  03/02/2021    SOCIAL HISTORY: Social History   Socioeconomic History   Marital status: Married    Spouse name: [name]   Number of children: 1   Years of education: Not on file   Highest education level: Not on file  Occupational History   Occupation: Field seismologist: [employer]  Tobacco Use   Smoking status: Former    Packs/day: 1.00    Years: 40.00    Total pack years: 40.00    Types: Cigarettes    Quit date: 05/11/2009    Years since quitting: 13.2   Smokeless tobacco: Never  Vaping Use   Vaping Use: Never used  Substance and Sexual Activity   Alcohol use: Not Currently   Drug use: No   Sexual activity: Not Currently  Other Topics Concern   Not on file  Social History Narrative   Not on file   Social Determinants of Health   Financial Resource Strain: Low Risk  (06/22/2021)   Overall Financial Resource  Strain (CARDIA)    Difficulty of Paying Living Expenses: Not hard at all  Food Insecurity: No Food Insecurity (02/25/2021)   Hunger Vital Sign    Worried About Running Out of Food in the Last Year: Never true    Ran Out of Food in the Last Year: Never true  Transportation Needs: No Transportation Needs (06/22/2021)   PRAPARE - Hydrologist (Medical): No    Lack of Transportation (Non-Medical): No  Physical Activity: Inactive (05/25/2021)   Exercise Vital Sign    Days of Exercise per Week: 0 days    Minutes of Exercise per Session: 0 min  Stress: No Stress Concern Present (02/25/2021)   [place]    Feeling of Stress : Only a little  Social Connections: Socially Integrated (02/25/2021)   Social Connection and Isolation Panel [NHANES]    Frequency of Communication with Friends and Family: More than three times a week    Frequency of Social Gatherings with Friends  and Family: More than three times a week    Attends Religious Services: More than 4 times per year    Active Member of Clubs or Organizations: Yes    Attends Archivist Meetings: More than 4 times per year    Marital Status: Married  Human resources officer Violence: Not At Risk (05/25/2021)   Humiliation, Afraid, Rape, and Kick questionnaire    Fear of Current or Ex-Partner: No    Emotionally Abused: No    Physically Abused: No    Sexually Abused: No    FAMILY HISTORY: Family History  Problem Relation Age of Onset   Cancer Mother        esophageal   Heart disease Father    Coronary artery disease Father    Hyperlipidemia Sister    Hyperlipidemia Sister    Hyperlipidemia Brother    Hyperlipidemia Brother    Hyperlipidemia Brother    Coronary artery disease Maternal Grandmother     Review of Systems  Constitutional:  Negative for appetite change, chills, fatigue, fever and unexpected weight change.  HENT:   Negative  for hearing loss, lump/mass and trouble swallowing.   Eyes:  Negative for eye problems and icterus.  Respiratory:  Negative for chest tightness, cough and shortness of breath.   Cardiovascular:  Negative for chest pain, leg swelling and palpitations.  Gastrointestinal:  Negative for abdominal distention, abdominal pain, constipation, diarrhea, nausea and vomiting.  Endocrine: Negative for hot flashes.  Genitourinary:  Negative for difficulty urinating.   Musculoskeletal:  Negative for arthralgias.  Skin:  Negative for itching and rash.  Neurological:  Negative for dizziness, extremity weakness, headaches and numbness.  Hematological:  Negative for adenopathy. Does not bruise/bleed easily.  Psychiatric/Behavioral:  Negative for depression. The patient is not nervous/anxious.       PHYSICAL EXAMINATION  ECOG PERFORMANCE STATUS: 1 - Symptomatic but completely ambulatory  Vitals:   07/22/22 0911  BP: (!) 131/57  Pulse: 62  Resp: 16  Temp: 98.1 F (36.7 C)  SpO2: 100%    Physical Exam Constitutional:      General: She is not in acute distress.    Appearance: Normal appearance. She is not toxic-appearing.  HENT:     Head: Normocephalic and atraumatic.     Mouth/Throat:     Comments: Graft appears healthy.  The tongue appears raw but no overt lesions Eyes:     General: No scleral icterus. Cardiovascular:     Rate and Rhythm: Normal rate and regular rhythm.     Pulses: Normal pulses.     Heart sounds: Normal heart sounds.  Pulmonary:     Effort: Pulmonary effort is normal.     Breath sounds: Normal breath sounds.  Abdominal:     General: Abdomen is flat. Bowel sounds are normal. There is no distension.     Palpations: Abdomen is soft.     Tenderness: There is no abdominal tenderness.  Musculoskeletal:        General: No swelling.     Cervical back: Neck supple.  Lymphadenopathy:     Cervical: No cervical adenopathy.  Skin:    General: Skin is warm and dry.      Findings: No rash.  Neurological:     General: No focal deficit present.     Mental Status: She is alert.  Psychiatric:        Mood and Affect: Mood normal.        Behavior: Behavior normal.     LABORATORY  DATA:  CBC    Component Value Date/Time   WBC 6.2 05/02/2022 1036   WBC 5.6 07/19/2021 0848   WBC 4.7 05/18/2021 0516   RBC 4.36 05/02/2022 1036   RBC 3.79 (L) 07/19/2021 0848   HGB 13.2 05/02/2022 1036   HCT 39.8 05/02/2022 1036   PLT 230 05/02/2022 1036   MCV 91 05/02/2022 1036   MCH 30.3 05/02/2022 1036   MCH 31.1 07/19/2021 0848   MCHC 33.2 05/02/2022 1036   MCHC 32.6 07/19/2021 0848   RDW 14.1 05/02/2022 1036   LYMPHSABS 1.0 05/02/2022 1036   MONOABS 0.5 07/19/2021 0848   EOSABS 0.1 05/02/2022 1036   BASOSABS 0.1 05/02/2022 1036    CMP     Component Value Date/Time   NA 141 05/26/2022 1015   K 5.7 (H) 05/26/2022 1015   CL 103 05/26/2022 1015   CO2 24 05/26/2022 1015   GLUCOSE 90 05/26/2022 1015   GLUCOSE 98 07/19/2021 0848   BUN 19 05/26/2022 1015   CREATININE 0.90 05/26/2022 1015   CREATININE 0.84 07/19/2021 0848   CALCIUM 9.9 05/26/2022 1015   PROT 6.1 05/26/2022 1015   ALBUMIN 4.0 05/26/2022 1015   AST 23 05/26/2022 1015   AST 19 07/19/2021 0848   ALT 11 05/26/2022 1015   ALT 12 07/19/2021 0848   ALKPHOS 55 05/26/2022 1015   BILITOT <0.2 05/26/2022 1015   BILITOT 0.3 07/19/2021 0848   GFRNONAA >60 07/19/2021 0848   GFRAA 83 08/26/2020 0927    I have reviewed her imaging results from care everywhere.  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  Her pathologic staging is pT3 N3B.  Given positive margins and presence of extracapsular extension,she completed 6 weeks of chemoradiation. She is here for her annual follow-up.  Since her last visit, followed up with ENT in October, no concern for recurrence.  She denies any new symptoms, she at this time continues to recover with no concerns.  Physical examination unremarkable, graft appears healthy.  Oral exam without any concerns.  Her primary care doctor has been managing her hypothyroidism and she is on levothyroxine.  She will return to clinic in 1 year with medical oncology or sooner as needed.   Total encounter time: 30 minutes in face-to-face visit time, chart review, lab review, care coordination, and documentation of the encounter.  We have discussed about surveillance recommendations, advancing G-tube feedings, expected recovery time and follow-up. *Total Encounter Time as defined by the Centers for Medicare and Medicaid Services includes, in addition to the face-to-face time of a patient visit (documented in the note above) non-face-to-face time: obtaining and reviewing outside history, ordering and reviewing medications, tests or procedures, care coordination (communications with other health care professionals or caregivers) and documentation in the medical record.

## 2022-07-26 ENCOUNTER — Other Ambulatory Visit: Payer: Self-pay | Admitting: Physician Assistant

## 2022-07-26 DIAGNOSIS — F419 Anxiety disorder, unspecified: Secondary | ICD-10-CM

## 2022-08-17 ENCOUNTER — Other Ambulatory Visit: Payer: Self-pay | Admitting: Hematology and Oncology

## 2022-08-17 DIAGNOSIS — C069 Malignant neoplasm of mouth, unspecified: Secondary | ICD-10-CM

## 2022-08-17 NOTE — Progress Notes (Signed)
Port removal order placed.  Tammy Brady

## 2022-08-23 ENCOUNTER — Ambulatory Visit (HOSPITAL_COMMUNITY)
Admission: RE | Admit: 2022-08-23 | Discharge: 2022-08-23 | Disposition: A | Discharge status: Home / Self Care | Payer: Medicare Other | Source: Ambulatory Visit | Attending: Hematology and Oncology | Admitting: Hematology and Oncology

## 2022-08-23 DIAGNOSIS — C069 Malignant neoplasm of mouth, unspecified: Secondary | ICD-10-CM | POA: Insufficient documentation

## 2022-08-23 DIAGNOSIS — Z452 Encounter for adjustment and management of vascular access device: Secondary | ICD-10-CM | POA: Insufficient documentation

## 2022-08-23 HISTORY — PX: IR REMOVAL TUN ACCESS W/ PORT W/O FL MOD SED: IMG2290

## 2022-08-23 MED ORDER — LIDOCAINE-EPINEPHRINE 1 %-1:100000 IJ SOLN
INTRAMUSCULAR | Status: AC
  Administered 2022-08-23: 10 mL via INTRADERMAL
  Filled 2022-08-23: qty 1

## 2022-08-23 NOTE — Procedures (Signed)
Interventional Radiology Procedure Note  Procedure: Removal of a right IJ approach single lumen PowerPort.    Complications: None  Recommendations:  - Ok to shower tomorrow - Do not submerge for 7-10 days - Routine wound care   Signed,  Dulcy Fanny. Earleen Newport, DO

## 2022-08-31 DIAGNOSIS — C031 Malignant neoplasm of lower gum: Secondary | ICD-10-CM | POA: Diagnosis not present

## 2022-08-31 DIAGNOSIS — C760 Malignant neoplasm of head, face and neck: Secondary | ICD-10-CM | POA: Diagnosis not present

## 2022-09-01 ENCOUNTER — Telehealth: Payer: Self-pay

## 2022-09-01 NOTE — Progress Notes (Signed)
Care Management & Coordination Services Pharmacy Team  Reason for Encounter: General adherence update   Contacted patient for general health update and medication adherence call.  Spoke with patient on 09/05/2022    What concerns do you have about your medications? No concerns   The patient denies side effects with their medications.   How often do you forget or accidentally miss a dose? Never  Do you use a pillbox? No  Are you having any problems getting your medications from your pharmacy? No  Has the cost of your medications been a concern? No  Since last visit with PharmD, no interventions have been made.   The patient has not had an ED visit since last contact.   The patient denies problems with their health.   Patient denies concerns or questions for [place] Constable, PharmD at this time.   Counseled patient on: Great job taking medications   Chart Updates:  Recent office visits:  05/26/22 Rhae Hammock LPN. Medicare Annual Wellness. No med changes.   05/02/22 Marge Duncans PA-C. Seen for routine visit. Referral to Urology. D/C Gabapentin '300mg'$  and Metoprolol Tartrate '50mg'$ .  Recent consult visits:  08/31/22 (Oncology) Gordy Councilman MD. Seen for Carcinoma of lower gingiva. No med changes.   07/22/22 (Oncology) Benay Pike MD. Seen for squamous cell carcinoma. No med changes.   06/14/22 (OBGYN) Demetrio Lapping FNP. Seen for cervical erosion. Reported pt no longer taking Diltiazem '240mg'$ , Pantoprazole '40mg'$ , Simvastatin '40mg'$  and Levothyroxine 120mg.   05/06/22 (Dentistry) LArther AbbottOn, DDS. Seen for Abfraction. No med changes.   04/13/22 (Oncology) LGordy CouncilmanMD. Seen for follow up. D/C Diltiazem '120mg'$  and Levothyroxine 10109m.   04/08/22 (Oncology) BlLennox GrumblesD. Seen for follow up. No med changes.   Hospital visits:  None in previous 6 months  Medications: Outpatient Encounter Medications as of 09/01/2022  Medication Sig    acetaminophen (TYLENOL) 500 MG tablet Take 500 mg by mouth every 6 (six) hours as needed for mild pain.   aspirin EC 81 MG tablet Take 1 tablet (81 mg total) by mouth daily. Swallow whole.   calcium carbonate (OS-CAL) 600 MG TABS Take 600 mg by mouth daily.   diltiazem (CARDIZEM CD) 300 MG 24 hr capsule TAKE 1 CAPSULE BY MOUTH DAILY   fluticasone (FLOVENT HFA) 110 MCG/ACT inhaler Inhale 2 puffs into the lungs 2 (two) times daily.   levothyroxine (SYNTHROID) 88 MCG tablet TAKE 1 TABLET BY MOUTH DAILY  BEFORE BREAKFAST   nitroGLYCERIN (NITROSTAT) 0.4 MG SL tablet Place 1 tablet (0.4 mg total) under the tongue every 5 (five) minutes as needed.   simvastatin (ZOCOR) 40 MG tablet TAKE 1 TABLET BY MOUTH AT  BEDTIME   sodium fluoride (SODIUM FLUORIDE 5000 PPM) 1.1 % GEL dental gel Place 1 pea-size drop into each tooth space of fluoride trays once a day at bedtime.  Leave trays in for 5 minutes and then remove.  Spit out excess fluoride, but DO NOT rinse with water, eat or drink for at least 30 minutes after use.   traZODone (DESYREL) 50 MG tablet TAKE 1 TABLET BY MOUTH AT  BEDTIME   No facility-administered encounter medications on file as of 09/01/2022.    Recent vitals BP Readings from Last 3 Encounters:  07/22/22 (!) 131/57  05/26/22 108/60  05/02/22 108/68   Pulse Readings from Last 3 Encounters:  07/22/22 62  05/26/22 (!) 57  05/02/22 (!) 57   Wt Readings from Last 3 Encounters:  07/22/22 132 lb (59.9 kg)  05/26/22 130 lb 12.8 oz (59.3 kg)  05/02/22 128 lb 6.4 oz (58.2 kg)   BMI Readings from Last 3 Encounters:  07/22/22 21.97 kg/m  05/26/22 21.77 kg/m  05/02/22 21.37 kg/m    Recent lab results    Component Value Date/Time   NA 141 05/26/2022 1015   K 5.7 (H) 05/26/2022 1015   CL 103 05/26/2022 1015   CO2 24 05/26/2022 1015   GLUCOSE 90 05/26/2022 1015   GLUCOSE 98 07/19/2021 0848   BUN 19 05/26/2022 1015   CREATININE 0.90 05/26/2022 1015   CREATININE 0.84 07/19/2021  0848   CALCIUM 9.9 05/26/2022 1015    Lab Results  Component Value Date   CREATININE 0.90 05/26/2022   EGFR 69 05/26/2022   GFRNONAA >60 07/19/2021   GFRAA 83 08/26/2020   Lab Results  Component Value Date/Time   HGBA1C  07/15/2009 05:45 PM    5.1 (NOTE) The ADA recommends the following therapeutic goal for glycemic control related to Hgb A1c measurement: Goal of therapy: <6.5 Hgb A1c  Reference: American Diabetes Association: Clinical Practice Recommendations 2010, Diabetes Care, 2010, 33: (Suppl  1).    Lab Results  Component Value Date   CHOL 160 05/02/2022   HDL 79 05/02/2022   LDLCALC 68 05/02/2022   TRIG 66 05/02/2022   CHOLHDL 2.0 05/02/2022    Care Gaps: Annual wellness visit in last year? Yes   Star Rating Drugs:  Medication:  Last Fill: Day Supply Simvastatin   06/11/22-03/18/22 [place], [place] Pharmacist Assistant  [phone]

## 2022-09-06 DIAGNOSIS — Z1231 Encounter for screening mammogram for malignant neoplasm of breast: Secondary | ICD-10-CM | POA: Diagnosis not present

## 2022-09-06 LAB — MM DIGITAL SCREENING BILATERAL

## 2022-09-12 ENCOUNTER — Other Ambulatory Visit: Payer: Self-pay

## 2022-09-12 DIAGNOSIS — Z1231 Encounter for screening mammogram for malignant neoplasm of breast: Secondary | ICD-10-CM

## 2022-09-13 ENCOUNTER — Encounter: Payer: Self-pay | Admitting: Physician Assistant

## 2022-09-13 ENCOUNTER — Ambulatory Visit (INDEPENDENT_AMBULATORY_CARE_PROVIDER_SITE_OTHER): Payer: Medicare Other | Admitting: Physician Assistant

## 2022-09-13 VITALS — BP 126/78 | HR 58 | Temp 97.0°F | Ht 65.0 in | Wt 129.8 lb

## 2022-09-13 DIAGNOSIS — E038 Other specified hypothyroidism: Secondary | ICD-10-CM | POA: Diagnosis not present

## 2022-09-13 DIAGNOSIS — I700 Atherosclerosis of aorta: Secondary | ICD-10-CM | POA: Diagnosis not present

## 2022-09-13 DIAGNOSIS — R82998 Other abnormal findings in urine: Secondary | ICD-10-CM | POA: Diagnosis not present

## 2022-09-13 DIAGNOSIS — D0000 Carcinoma in situ of oral cavity, unspecified site: Secondary | ICD-10-CM

## 2022-09-13 DIAGNOSIS — I10 Essential (primary) hypertension: Secondary | ICD-10-CM

## 2022-09-13 DIAGNOSIS — E559 Vitamin D deficiency, unspecified: Secondary | ICD-10-CM

## 2022-09-13 DIAGNOSIS — I2510 Atherosclerotic heart disease of native coronary artery without angina pectoris: Secondary | ICD-10-CM

## 2022-09-13 DIAGNOSIS — J060 Acute laryngopharyngitis: Secondary | ICD-10-CM | POA: Diagnosis not present

## 2022-09-13 LAB — POCT URINALYSIS DIP (CLINITEK)
Bilirubin, UA: NEGATIVE
Glucose, UA: NEGATIVE mg/dL
Ketones, POC UA: NEGATIVE mg/dL
Nitrite, UA: NEGATIVE
POC PROTEIN,UA: NEGATIVE
Spec Grav, UA: 1.015 (ref 1.010–1.025)
Urobilinogen, UA: NEGATIVE E.U./dL — AB
pH, UA: 6 (ref 5.0–8.0)

## 2022-09-13 MED ORDER — AZITHROMYCIN 250 MG PO TABS: ORAL_TABLET | ORAL | 0 refills | Status: AC

## 2022-09-13 NOTE — Progress Notes (Signed)
Subjective:  Patient ID: Tammy Brady, female    DOB: 07-15-1950  Age: 72 y.o. MRN: [id number]  Chief Complaint  Patient presents with   Hyperlipidemia   Hypertension    Hyperlipidemia  Hypertension    Pt presents for follow up of hypertension.  The patient is tolerating the medication well without side effects. Compliance with treatment has been good; including taking medication as directed , maintains a healthy diet and regular exercise regimen , and following up as directed. Currently on cardizem CD '300mg'$  qd.  States bp running well at home  Pt with history of hypothyroidism - currently on synthroid 39mg qd - due for labwork Currently having no symptoms  Pt with history of aortic atherosclerosis - currently on zocor '40mg'$  qd  Mixed hyperlipidemia  Pt presents with hyperlipidemia.  Compliance with treatment has been good -The patient is compliant with medications, maintains a low cholesterol diet , follows up as directed , and maintains an exercise regimen . The patient denies experiencing any hypercholesterolemia related symptoms. She is currently taking zocor '40mg'$  qd  Pt with history of emphysema.  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  [facility]  Last appt was last week.  She will follow with radiology in July - She states all is stable. Still having some trouble with eating/drinking  Pt with history of hematuria - did get ua today with trace of leukocytes and blood but without symptoms - will culture urine She has seen urology and had cystoscope done which she states was normal She also had GYN evaluation and got pap  smear which she states was normal  Pt states she has had mild sinus headaches, congestion and sore throat.  Some productive cough as well.  Denies fever or malaise  Current Outpatient Medications on File Prior to Visit  Medication Sig Dispense Refill   acetaminophen (TYLENOL) 500 MG tablet Take 500 mg by mouth every 6 (six) hours as needed for mild pain.     aspirin EC 81 MG tablet Take 1 tablet (81 mg total) by mouth daily. Swallow whole. 90 tablet 3   calcium carbonate (OS-CAL) 600 MG TABS Take 600 mg by mouth daily.     diltiazem (CARDIZEM CD) 300 MG 24 hr capsule TAKE 1 CAPSULE BY MOUTH DAILY 90 capsule 1   fluticasone (FLOVENT HFA) 110 MCG/ACT inhaler Inhale 2 puffs into the lungs 2 (two) times daily. 3 each 3   levothyroxine (SYNTHROID) 88 MCG tablet TAKE 1 TABLET BY MOUTH DAILY  BEFORE BREAKFAST 90 tablet 3   nitroGLYCERIN (NITROSTAT) 0.4 MG SL tablet Place 1 tablet (0.4 mg total) under the tongue every 5 (five) minutes as needed. 25 tablet 6   simvastatin (ZOCOR) 40 MG tablet TAKE 1 TABLET BY MOUTH AT  BEDTIME 90 tablet 3   traZODone (DESYREL) 50 MG tablet TAKE 1 TABLET BY MOUTH AT  BEDTIME 90 tablet 3   No current facility-administered medications on file prior to visit.   Past Medical History:  Diagnosis Date   Aortic atherosclerosis (HCC)    CAD (coronary artery disease)    Mild non-obstructive disease by cath  January 2011. Coronary CTA showed  mild (25-49%) plaque in the RCA and minimal (<25%) coronary CTA 04/2021 with coronary Ca score 257.  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   Past Surgical History:  Procedure Laterality Date   APPENDECTOMY      EXCISION ORAL TUMOR  01/18/2021   see details [facility] Everywhere   IR IMAGING GUIDED PORT INSERTION  03/02/2021   IR REMOVAL TUN ACCESS W/ PORT W/O FL MOD SED  08/23/2022    Family History  Problem Relation Age of Onset   Cancer Mother        esophageal   Heart disease Father    Coronary artery disease Father    Hyperlipidemia Sister    Hyperlipidemia Sister    Hyperlipidemia Brother    Hyperlipidemia Brother    Hyperlipidemia Brother    Coronary artery disease Maternal Grandmother    Social History   Socioeconomic History   Marital status: Married    Spouse name: [name]   Number of children: 1   Years of education: Not on file   Highest education level: Not on file  Occupational History   Occupation: Field seismologist: [employer]  Tobacco Use   Smoking status: Former    Packs/day: 1.00    Years: 40.00    Total pack years: 40.00    Types: Cigarettes    Quit date: 05/11/2009    Years since quitting: 13.3   Smokeless tobacco: Never  Vaping Use   Vaping Use: Never used  Substance and Sexual Activity   Alcohol use: Not Currently   Drug use: No   Sexual activity: Not Currently  Other Topics Concern   Not on file  Social History Narrative   Not on file   Social Determinants of Health   Financial Resource Strain: Low Risk  (06/22/2021)   Overall Financial Resource Strain (CARDIA)    Difficulty of Paying Living Expenses: Not hard at all  Food Insecurity: No Food Insecurity (02/25/2021)   Hunger Vital Sign    Worried About Running Out of Food in the Last Year: Never true    Ran Out of Food in the Last Year: Never true  Transportation Needs: No Transportation Needs (06/22/2021)   PRAPARE - Hydrologist (Medical): No    Lack of Transportation (Non-Medical): No  Physical Activity: Inactive (05/25/2021)   Exercise Vital Sign    Days of Exercise per Week: 0 days    Minutes of Exercise per Session: 0 min  Stress: No Stress Concern  Present (02/25/2021)   [place]    Feeling of Stress : Only a little  Social Connections: Socially Integrated (02/25/2021)   Social Connection and Isolation Panel [NHANES]    Frequency of Communication with Friends and Family: More than three times a week    Frequency of Social Gatherings with Friends and Family: More than three times a week    Attends Religious Services: More than 4 times per year    Active Member of [employer]  or Organizations: Yes    Attends Music therapist: More than 4 times per year    Marital Status: Married    Review of Systems CONSTITUTIONAL: Negative for chills, fatigue, fever, unintentional weight gain and unintentional weight loss.  E/N/T:see HPI CARDIOVASCULAR: Negative for chest pain, dizziness, palpitations and pedal edema.  RESPIRATORY: see HPI GASTROINTESTINAL: Negative for abdominal pain, acid reflux symptoms, constipation, diarrhea, nausea and vomiting.  MSK: Negative for arthralgias and myalgias.  INTEGUMENTARY: Negative for rash.  NEUROLOGICAL: Negative for dizziness and headaches.  PSYCHIATRIC: Negative for sleep disturbance and to question depression screen.  Negative for depression, negative for anhedonia.     Office Visit on 09/13/2022  Component Date Value Ref Range Status   Glucose, UA 09/13/2022 negative  negative mg/dL Final   Bilirubin, UA 09/13/2022 negative  negative Final   Ketones, POC UA 09/13/2022 negative  negative mg/dL Final   Spec Grav, UA 09/13/2022 1.015  1.010 - 1.025 Final   Blood, UA 09/13/2022 trace-intact (A)  negative Final   pH, UA 09/13/2022 6.0  5.0 - 8.0 Final   POC PROTEIN,UA 09/13/2022 negative  negative, trace Final   Urobilinogen, UA 09/13/2022 negative (A)  0.2 or 1.0 E.U./dL Final   Nitrite, UA 09/13/2022 Negative  Negative Final   Leukocytes, UA 09/13/2022 Trace (A)  Negative Final      Objective:  PHYSICAL EXAM:   VS: BP  126/78 (BP Location: Left Arm, Patient Position: Sitting)   Pulse (!) 58   Temp (!) 97 F (36.1 C) (Temporal)   Ht '5\' 5"'$  (1.651 m)   Wt 129 lb 12.8 oz (58.9 kg)   SpO2 99%   BMI 21.60 kg/m   GEN: Well nourished, well developed, in no acute distress  HEENT: normal external ears and nose - normal external auditory canals and TMS - Oropharynx - mild erythema -- surgical changes noted of jaw/tongue Cardiac: RRR; no murmurs, rubs, or gallops,no edema -  Respiratory:  normal respiratory rate and pattern with no distress - normal breath sounds with no rales, rhonchi, wheezes or rubs MS: no deformity or atrophy  Skin: warm and dry, no rash  Neuro:  Alert and Oriented x 3,  Psych: euthymic mood, appropriate affect and demeanor   Office Visit on 09/13/2022  Component Date Value Ref Range Status   Glucose, UA 09/13/2022 negative  negative mg/dL Final   Bilirubin, UA 09/13/2022 negative  negative Final   Ketones, POC UA 09/13/2022 negative  negative mg/dL Final   Spec Grav, UA 09/13/2022 1.015  1.010 - 1.025 Final   Blood, UA 09/13/2022 trace-intact (A)  negative Final   pH, UA 09/13/2022 6.0  5.0 - 8.0 Final   POC PROTEIN,UA 09/13/2022 negative  negative, trace Final   Urobilinogen, UA 09/13/2022 negative (A)  0.2 or 1.0 E.U./dL Final   Nitrite, UA 09/13/2022 Negative  Negative Final   Leukocytes, UA 09/13/2022 Trace (A)  Negative Final    Lab Results  Component Value Date   WBC 6.2 05/02/2022   HGB 13.2 05/02/2022   HCT 39.8 05/02/2022   PLT 230 05/02/2022   GLUCOSE 90 05/26/2022   CHOL 160 05/02/2022   TRIG 66 05/02/2022   HDL 79 05/02/2022   LDLCALC 68 05/02/2022   ALT 11 05/26/2022   AST 23 05/26/2022   NA 141 05/26/2022   K 5.7 (H) 05/26/2022   CL 103 05/26/2022   CREATININE 0.90 05/26/2022   BUN 19 05/26/2022   CO2 24 05/26/2022  TSH 1.150 05/02/2022   INR 0.99 07/16/2009   HGBA1C  07/15/2009    5.1 (NOTE) The ADA recommends the following therapeutic goal for  glycemic control related to Hgb A1c measurement: Goal of therapy: <6.5 Hgb A1c  Reference: American Diabetes Association: Clinical Practice Recommendations 2010, Diabetes Care, 2010, 33: (Suppl  1).      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  Meds ordered this encounter  Medications   azithromycin (ZITHROMAX) 250 MG tablet    Sig: Take 2 tablets on day 1, then 1 tablet daily on days 2 through 5    Dispense:  6 tablet    Refill:  0    Order Specific Question:   Supervising Provider    AnswerShelton Silvas     Orders Placed This Encounter  Procedures   Urine Culture   CBC with Differential/Platelet   Comprehensive metabolic panel   TSH   Lipid panel   VITAMIN D 25 Hydroxy (Vit-D Deficiency, Fractures)   POCT URINALYSIS DIP (CLINITEK)     Follow-up: Return in about 6 months (around 03/16/2023) for chronic fasting follow up.  An After Visit Summary was printed and given to the patient.  Yetta Flock [facility] [phone]

## 2022-09-14 LAB — COMPREHENSIVE METABOLIC PANEL
ALT: 14 IU/L (ref 0–32)
AST: 24 IU/L (ref 0–40)
Albumin/Globulin Ratio: 2.4 — ABNORMAL HIGH (ref 1.2–2.2)
Albumin: 4.5 g/dL (ref 3.9–4.9)
Alkaline Phosphatase: 70 IU/L (ref 44–121)
BUN/Creatinine Ratio: 20 (ref 12–28)
BUN: 16 mg/dL (ref 8–27)
Bilirubin Total: 0.3 mg/dL (ref 0.0–1.2)
CO2: 23 mmol/L (ref 20–29)
Calcium: 10 mg/dL (ref 8.7–10.3)
Chloride: 101 mmol/L (ref 96–106)
Creatinine, Ser: 0.81 mg/dL (ref 0.57–1.00)
Globulin, Total: 1.9 g/dL (ref 1.5–4.5)
Glucose: 82 mg/dL (ref 70–99)
Potassium: 5.6 mmol/L — ABNORMAL HIGH (ref 3.5–5.2)
Sodium: 138 mmol/L (ref 134–144)
Total Protein: 6.4 g/dL (ref 6.0–8.5)
eGFR: 79 mL/min/{1.73_m2} (ref >59)

## 2022-09-14 LAB — CBC WITH DIFFERENTIAL/PLATELET
Basophils Absolute: 0 10*3/uL (ref 0.0–0.2)
Basos: 0 %
EOS (ABSOLUTE): 0.1 10*3/uL (ref 0.0–0.4)
Eos: 1 %
Hematocrit: 39.8 % (ref 34.0–46.6)
Hemoglobin: 13.2 g/dL (ref 11.1–15.9)
Immature Grans (Abs): 0 10*3/uL (ref 0.0–0.1)
Immature Granulocytes: 0 %
Lymphocytes Absolute: 1.3 10*3/uL (ref 0.7–3.1)
Lymphs: 20 %
MCH: 30.3 pg (ref 26.6–33.0)
MCHC: 33.2 g/dL (ref 31.5–35.7)
MCV: 92 fL (ref 79–97)
Monocytes Absolute: 0.5 10*3/uL (ref 0.1–0.9)
Monocytes: 7 %
Neutrophils Absolute: 4.8 10*3/uL (ref 1.4–7.0)
Neutrophils: 72 %
Platelets: 218 10*3/uL (ref 150–450)
RBC: 4.35 x10E6/uL (ref 3.77–5.28)
RDW: 13.3 % (ref 11.7–15.4)
WBC: 6.7 10*3/uL (ref 3.4–10.8)

## 2022-09-14 LAB — LIPID PANEL
Chol/HDL Ratio: 2.3 ratio (ref 0.0–4.4)
Cholesterol, Total: 165 mg/dL (ref 100–199)
HDL: 73 mg/dL (ref >39)
LDL Chol Calc (NIH): 75 mg/dL (ref 0–99)
Triglycerides: 97 mg/dL (ref 0–149)
VLDL Cholesterol Cal: 17 mg/dL (ref 5–40)

## 2022-09-14 LAB — CARDIOVASCULAR RISK ASSESSMENT

## 2022-09-14 LAB — TSH: TSH: 0.833 u[IU]/mL (ref 0.450–4.500)

## 2022-09-14 LAB — VITAMIN D 25 HYDROXY (VIT D DEFICIENCY, FRACTURES): Vit D, 25-Hydroxy: 68.9 ng/mL (ref 30.0–100.0)

## 2022-09-14 LAB — URINE CULTURE

## 2022-09-29 IMAGING — US IR IMAGING GUIDED PORT INSERTION
1 series · 1 of 1 positions shown · non-contrast
Comparison: none

CLINICAL DATA: Squamous cell carcinoma of the oral mucosa
TECHNIQUE: The procedure, risks, benefits, and alternatives were explained to
the patient. Questions regarding the procedure were encouraged and
answered. The patient understands and consents to the procedure.

[Series 1: ir fluoro/shunt/fist · 1 of 1 slices shown]
[im 1/1]
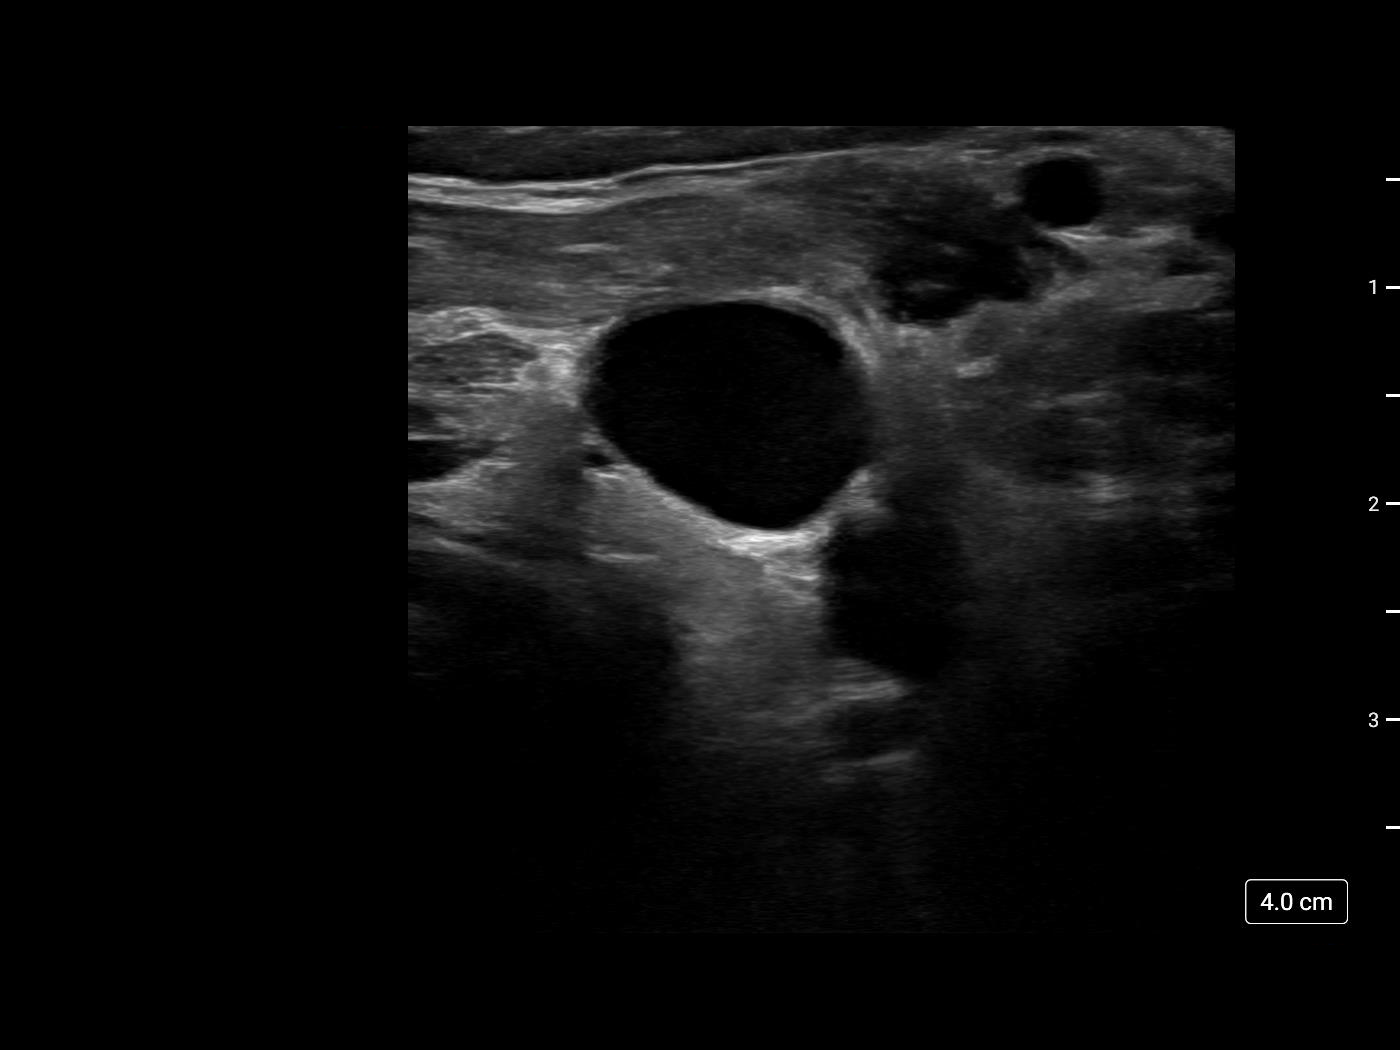

[1 of 1 positions shown; findings below may reference images not displayed]

EXAM:
TUNNELED PORT CATHETER PLACEMENT WITH ULTRASOUND AND FLUOROSCOPIC
GUIDANCE

FLUOROSCOPY TIME:  12 seconds; 1 mGy

ANESTHESIA/SEDATION:
Intravenous Fentanyl 566mcg and Versed 2mg were administered as
conscious sedation during continuous monitoring of the patient's
level of consciousness and physiological / cardiorespiratory status
by the radiology RN, with a total moderate sedation time of 19
minutes.
Patency of the right IJ vein was confirmed with ultrasound with
image documentation. An appropriate skin site was determined. Skin
site was marked. Region was prepped using maximum barrier technique
including cap and mask, sterile gown, sterile gloves, large sterile
sheet, and Chlorhexidine as cutaneous antisepsis. The region was
infiltrated locally with 1% lidocaine. Under real-time ultrasound
guidance, the right IJ vein was accessed with a 21 gauge
micropuncture needle; the needle tip within the vein was confirmed
with ultrasound image documentation. Needle was exchanged over a 018
guidewire for transitional dilator, and vascular measurement was
performed.

A small incision was made on the right anterior chest wall and a
subcutaneous pocket fashioned. The power-injectable port was
positioned and its catheter tunneled to the right IJ dermatotomy
site. The transitional dilator was exchanged over an Amplatz wire
for a peel-away sheath, through which the port catheter, which had
been trimmed to the appropriate length, was advanced and positioned
under fluoroscopy with its tip at the cavoatrial junction. Spot
chest radiograph confirms good catheter position and no
pneumothorax. The port was flushed per protocol. The pocket was
closed with deep interrupted and subcuticular continuous 3-0
Monocryl sutures. The incisions were covered with Dermabond then
covered with a sterile dressing.

The patient tolerated the procedure well.

COMPLICATIONS:
COMPLICATIONS
None immediate
IMPRESSION: Technically successful right IJ power-injectable port catheter
placement. Ready for routine use.

## 2022-09-30 ENCOUNTER — Other Ambulatory Visit: Payer: Self-pay | Admitting: Family Medicine

## 2022-10-09 ENCOUNTER — Encounter: Payer: Self-pay | Admitting: Physician Assistant

## 2022-10-10 ENCOUNTER — Other Ambulatory Visit: Payer: Self-pay | Admitting: Physician Assistant

## 2022-10-10 DIAGNOSIS — E038 Other specified hypothyroidism: Secondary | ICD-10-CM

## 2022-10-10 MED ORDER — LEVOTHYROXINE SODIUM 88 MCG PO TABS: 88.0000 ug | ORAL_TABLET | Freq: Every day | ORAL | 0 refills | Status: DC

## 2022-10-10 MED ORDER — LEVOTHYROXINE SODIUM 88 MCG PO TABS: 88.0000 ug | ORAL_TABLET | Freq: Every day | ORAL | 1 refills | Status: DC

## 2022-10-13 DIAGNOSIS — K036 Deposits [accretions] on teeth: Secondary | ICD-10-CM | POA: Diagnosis not present

## 2022-10-29 ENCOUNTER — Other Ambulatory Visit: Payer: Self-pay | Admitting: Physician Assistant

## 2022-10-29 DIAGNOSIS — E782 Mixed hyperlipidemia: Secondary | ICD-10-CM

## 2022-11-19 IMAGING — CT CT HEART MORP W/ CTA COR W/ SCORE W/ CA W/CM &/OR W/O CM
4 of 7 series · 8 of 20 positions shown, 9 images · non-contrast
Comparison: None.
COMPARISON: None.

Addendum:
EXAM:
OVER-READ INTERPRETATION  CT CHEST

The following report is an over-read performed by radiologist Dr.
Prescott Zollinger [[email or web address]] on 04/22/2021. This
over-read does not include interpretation of cardiac or coronary
anatomy or pathology. The coronary calcium score/coronary CTA
interpretation by the cardiologist is attached.
CLINICAL DATA: 70F with CAD, hypertension, hyperlipidemia, carotid
stenosis and back pain.
Cardiac/Coronary  CT
TECHNIQUE: The patient was scanned on a Phillips Force scanner.

[Series 6: ts diast sharp · axial · 0.39mm/px · z∈[-247,-206]mm · 2 of 313 slices shown]
[im 105/313  lung]
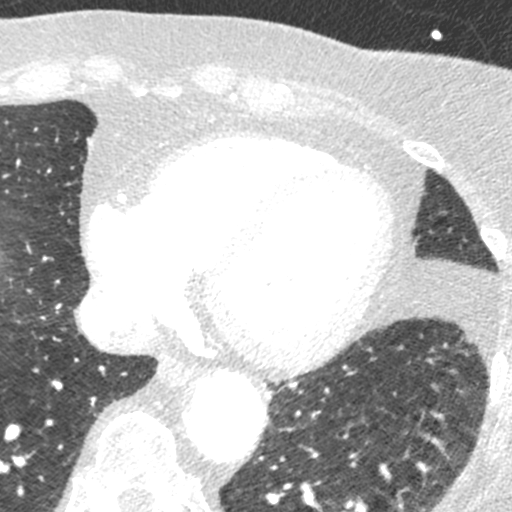
[im 209/313  lung]
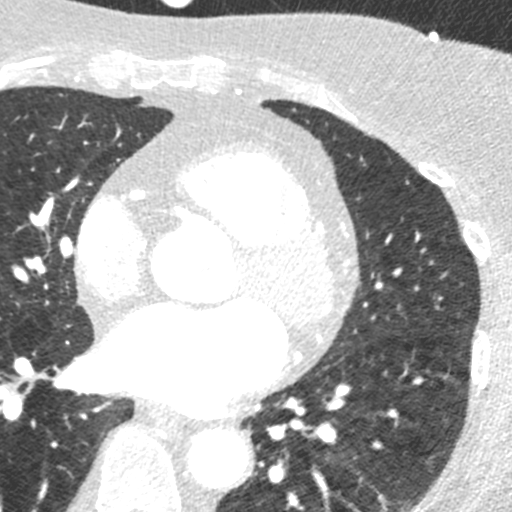

[Series 7: best diast · axial · 0.39mm/px · z∈[-247,-206]mm · 2 of 313 slices shown, 3 images]
[im 105/313  vessel]
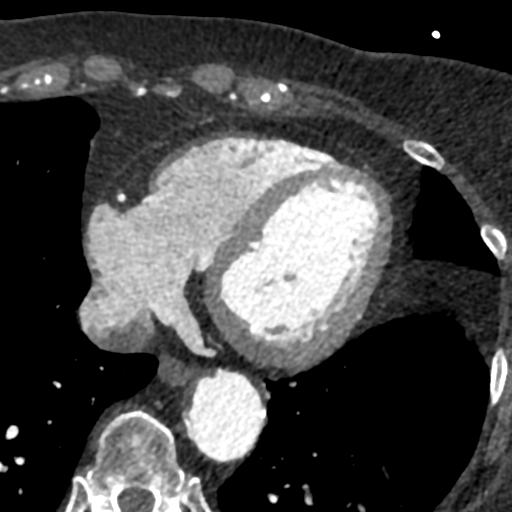
[im 105/313  lung]
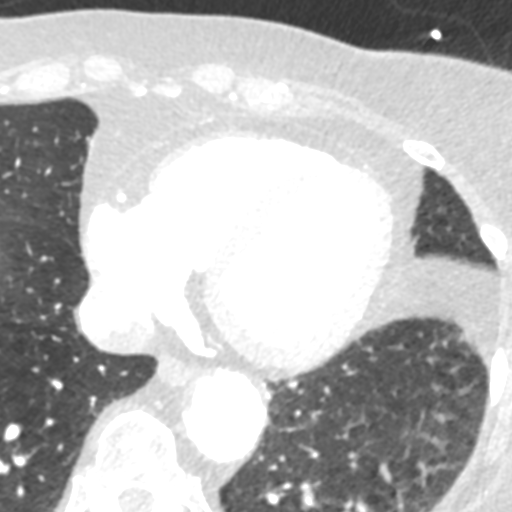
[im 209/313  vessel]
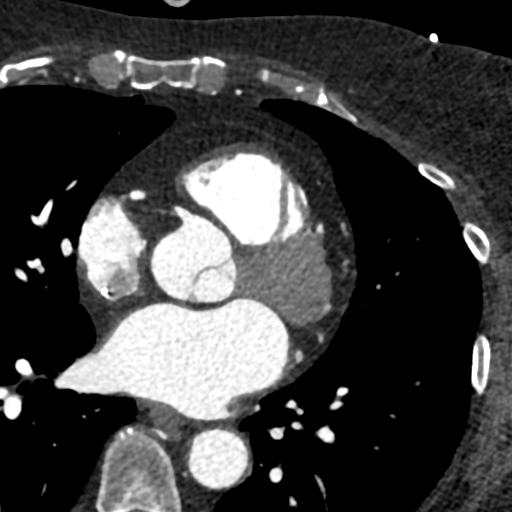

[Series 8: best syst · axial · 0.39mm/px · z∈[-247,-206]mm · 2 of 313 slices shown]
[im 105/313  vessel]
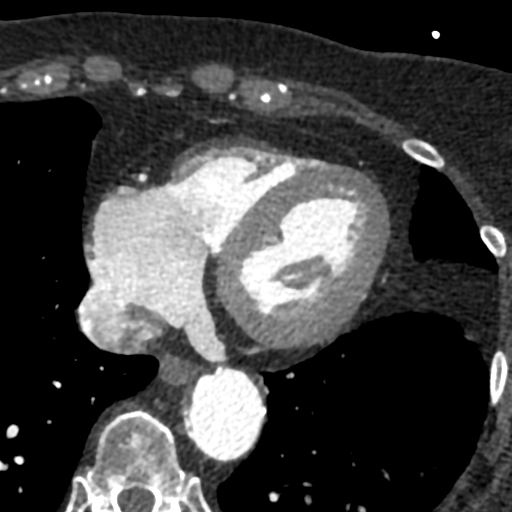
[im 209/313  vessel]
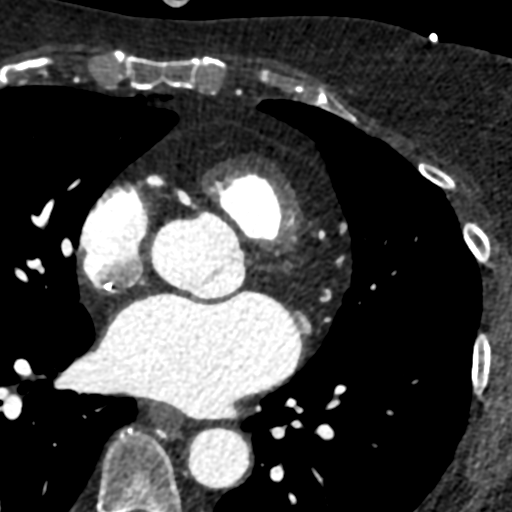

[Series 9: ts syst sharp · axial · 0.39mm/px · z∈[-247,-206]mm · 2 of 313 slices shown]
[im 105/313  lung]
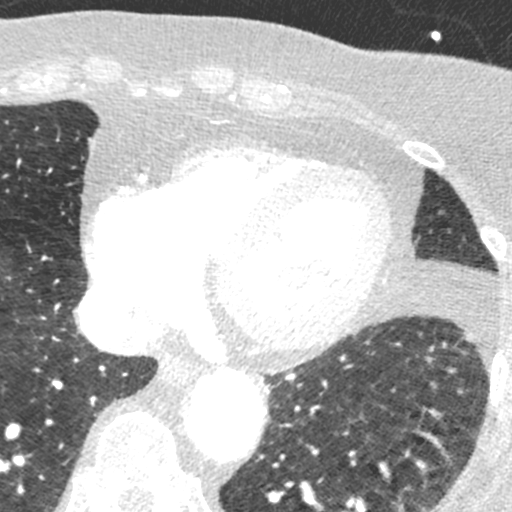
[im 209/313  lung]
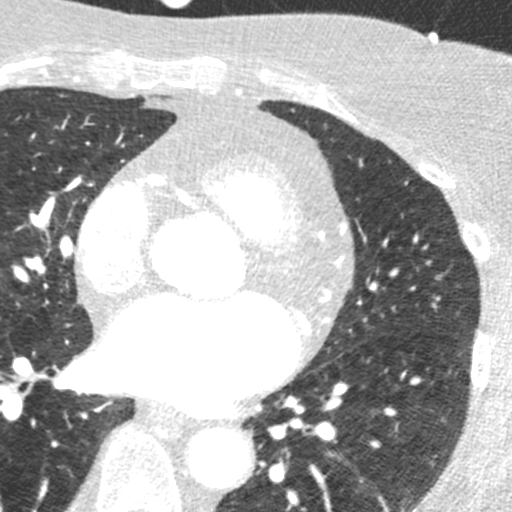

[8 of 20 positions shown; findings below may reference images not displayed]

FINDINGS: Central venous catheter terminating at the superior cavoatrial
junction. Atherosclerotic calcifications in the thoracic aorta. Mild
emphysematous changes are noted throughout the visualize lungs.
Within the visualized portions of the thorax there are no suspicious
appearing pulmonary nodules or masses, there is no acute
consolidative airspace disease, no pleural effusions, no
pneumothorax and no lymphadenopathy. Visualized portions of the
upper abdomen are unremarkable. There are no aggressive appearing
lytic or blastic lesions noted in the visualized portions of the
skeleton.
IMPRESSION: 1.  Aortic Atherosclerosis (K5WYM-K70.0).
2.  Emphysema (K5WYM-FGD.7).
FINDINGS: A 120 kV prospective scan was triggered in the descending thoracic
aorta at 111 HU's. Axial non-contrast 3 mm slices were carried out
through the heart. The data set was analyzed on a dedicated work
station and scored using the Agatson method. Gantry rotation speed
was 250 msecs and collimation was .6 mm. No beta blockade and 0.8 mg
of sl NTG was given. The 3D data set was reconstructed in 5%
intervals of the 67-82 % of the R-R cycle. Diastolic phases were
analyzed on a dedicated work station using MPR, MIP and VRT modes.
The patient received 80 cc of contrast.

Aorta: Normal size. Ascending aorta 3.1 cm. Diffuse aortic
atherosclerosis. No dissection.

Aortic Valve:  Trileaflet.  No calcifications.

Coronary Arteries:  Normal coronary origin.  Right dominance.

RCA is a large dominant artery that gives rise to PDA and PLVB.
There is mild (25-49%) calcified plaque in the mid RCA and minimal
(<25%) calcified plaque in the distal RCA.

Left main is a large artery that gives rise to LAD, RI, and LCX
arteries.

LAD is a large vessel that has minimal (<25%) calcified plaque in
the proximal and mid LAD.

LCX is a non-dominant artery that gives rise to one large OM1
branch. There is no plaque.

RI is a small vessel without plaque.

Coronary Calcium Score:

Left main: 0

Left anterior descending artery:

Left circumflex artery: 0

Right coronary artery: 203

Total: 257

Percentile: 87th

Other findings:

Normal pulmonary vein drainage into the left atrium. There are three
pulmonary veins on the right and two on the left.

Normal let atrial appendage without a thrombus.

Normal size of the pulmonary artery.
IMPRESSION: 1. Coronary calcium score of 257. This was 87th percentile for age-,
race-, and sex-matched controls.

2. Normal coronary origin with right dominance.

3. There is mild (25-49%) plaque in the RCA and minimal (<25%)
plaque in the LAD.

4. Recommend aggressive risk factor modification, including LDL goal
<70.

*** End of Addendum ***
EXAM:
OVER-READ INTERPRETATION  CT CHEST

The following report is an over-read performed by radiologist Dr.
Prescott Zollinger [REDACTED] on 04/22/2021. This
over-read does not include interpretation of cardiac or coronary
anatomy or pathology. The coronary calcium score/coronary CTA
interpretation by the cardiologist is attached.
FINDINGS: Central venous catheter terminating at the superior cavoatrial
junction. Atherosclerotic calcifications in the thoracic aorta. Mild
emphysematous changes are noted throughout the visualize lungs.
Within the visualized portions of the thorax there are no suspicious
appearing pulmonary nodules or masses, there is no acute
consolidative airspace disease, no pleural effusions, no
pneumothorax and no lymphadenopathy. Visualized portions of the
upper abdomen are unremarkable. There are no aggressive appearing
lytic or blastic lesions noted in the visualized portions of the
skeleton.
IMPRESSION: 1.  Aortic Atherosclerosis (K5WYM-K70.0).
2.  Emphysema (K5WYM-FGD.7).

## 2023-01-17 NOTE — Progress Notes (Signed)
Tammy Brady presents today for follow-up after completing radiation to her mandible on 04/19/2021.   Pain issues, if any: none to report Using a feeding tube?: had one now out since Sept 2023 Weight changes, if any: still around 130 lb according to pt, most days she feels like she is getting enough nutrition, using protein supplements Swallowing issues, if any: hard time swallowing certain textures, spaghetti noodles, etc., still having to modify what she eats Smoking or chewing tobacco? Former smoker, Quit May 2022 Using fluoride toothpaste daily? Yes, using fluoride trays Last ENT visit was on: Biagio Quint, MD - 08/31/2022   Other notable issues, if any: No major concerns at this time. Working out now as well. Sister died on 01/21/23.   Biagio Quint, MD - 08/31/2022  07/14/21 CT Neck: IMPRESSION: -- Postsurgical changes of the floor of mouth resection, mandibular reconstruction, and bilateral selective neck dissections without evidence of disease recurrence. (Primary site NI-RADS 1, neck NI-RADS 1)  07/14/21 CT Chest: IMPRESSION: *No thoracic metastatic disease. *Decreased conspicuity/stability of scattered bilateral micronodules. *New left greater than right biapical groundglass opacities and consolidations are likely treatment-related. *Moderate to advanced centrilobular emphysema. *Mild coronary artery atherosclerosis. *Colonic diverticulosis. *Cholelithiasis.   Assessment and Plan:   72 y.o. 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 She is now s/p CCRT on 04/19/21. She was originally scheduled for flap debulking and revision on 12/02/21, although surgery was aborted due to difficulties with nasotracheal intubation causing severe epistaxis and need for packing.  -  Extensively discussed her CT maxillofacial and risks and benefits of dental implants -Seeing rad onc in July - See me in Feb/March

## 2023-01-31 ENCOUNTER — Telehealth: Payer: Self-pay

## 2023-01-31 ENCOUNTER — Other Ambulatory Visit: Payer: Self-pay

## 2023-01-31 ENCOUNTER — Ambulatory Visit
Admission: RE | Admit: 2023-01-31 | Discharge: 2023-01-31 | Disposition: A | Discharge status: Home / Self Care | Payer: Medicare Other | Source: Ambulatory Visit | Attending: Radiation Oncology | Admitting: Radiation Oncology

## 2023-01-31 ENCOUNTER — Encounter: Payer: Self-pay | Admitting: Radiation Oncology

## 2023-01-31 VITALS — BP 130/65 | HR 58 | Temp 97.9°F | Resp 18 | Ht 65.0 in | Wt 128.2 lb

## 2023-01-31 DIAGNOSIS — Z7982 Long term (current) use of aspirin: Secondary | ICD-10-CM | POA: Insufficient documentation

## 2023-01-31 DIAGNOSIS — Z923 Personal history of irradiation: Secondary | ICD-10-CM | POA: Diagnosis not present

## 2023-01-31 DIAGNOSIS — R634 Abnormal weight loss: Secondary | ICD-10-CM | POA: Insufficient documentation

## 2023-01-31 DIAGNOSIS — Z79899 Other long term (current) drug therapy: Secondary | ICD-10-CM | POA: Diagnosis not present

## 2023-01-31 DIAGNOSIS — C031 Malignant neoplasm of lower gum: Secondary | ICD-10-CM

## 2023-01-31 DIAGNOSIS — C060 Malignant neoplasm of cheek mucosa: Secondary | ICD-10-CM

## 2023-01-31 DIAGNOSIS — Z8589 Personal history of malignant neoplasm of other organs and systems: Secondary | ICD-10-CM | POA: Insufficient documentation

## 2023-01-31 DIAGNOSIS — Z79890 Hormone replacement therapy (postmenopausal): Secondary | ICD-10-CM | POA: Diagnosis not present

## 2023-01-31 NOTE — Progress Notes (Signed)
Radiation Oncology         [phone] ________________________________  Name: Tammy Brady MRN: [id number]  Date: 01/31/2023  DOB: 1951-04-24  Follow-Up Visit Note  Outpatient  CC: Eduardo Osier, PA-C  Diagnosis and Prior Radiotherapy:    ICD-10-CM   1. 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 Kerekes presents today for follow-up after completing radiation to her mandible on 04/19/2021  Pain issues, if any: none to report Using a feeding tube?: had one now out since Sept 2023 Weight changes, if any: still around 130 lb according to pt, most days she feels like she is getting enough nutrition, using protein supplements Swallowing issues, if any: hard time swallowing certain textures, spaghetti noodles, etc., still having to modify what she eats Smoking or chewing tobacco? Former smoker, Quit May 2022 Using fluoride toothpaste daily? Yes, using fluoride trays Last ENT visit was on: Biagio Quint, MD - 08/31/2022    Other notable issues, if any: No major concerns at this time. Working out now as well. Sister died on 2023-01-23.    ALLERGIES:  is allergic to codeine and oxycodone.  Meds: Current Outpatient Medications  Medication Sig Dispense Refill   acetaminophen (TYLENOL) 500 MG tablet Take 500 mg by mouth every 6 (six) hours as needed for mild pain.     aspirin EC 81 MG tablet Take 1 tablet (81 mg total) by mouth daily. Swallow whole. 90 tablet 3   calcium carbonate (OS-CAL) 600 MG TABS Take 600 mg by mouth daily.     diltiazem (CARDIZEM CD) 300 MG 24 hr capsule TAKE 1 CAPSULE BY MOUTH DAILY 90 capsule 3   fluticasone (FLOVENT HFA) 110 MCG/ACT inhaler Inhale 2 puffs into the lungs 2 (two) times daily. 3 each 3   levothyroxine (SYNTHROID) 88 MCG tablet Take 1 tablet (88 mcg total) by mouth daily before breakfast. 14 tablet 0   nitroGLYCERIN  (NITROSTAT) 0.4 MG SL tablet Place 1 tablet (0.4 mg total) under the tongue every 5 (five) minutes as needed. 25 tablet 6   simvastatin (ZOCOR) 40 MG tablet TAKE 1 TABLET BY MOUTH AT  BEDTIME 90 tablet 3   traZODone (DESYREL) 50 MG tablet TAKE 1 TABLET BY MOUTH AT  BEDTIME 90 tablet 3   No current facility-administered medications for this encounter.    Physical Findings: The patient is in no acute distress. Patient is alert and oriented.  height is 5\' 5"  (1.651 m) and weight is 128 lb 3.2 oz (58.2 kg). Her temperature is 97.9 F (36.6 C). Her blood pressure is 130/65 and her pulse is 58 (abnormal). Her respiration is 18 and oxygen saturation is 97%. .    General: Alert and oriented, in no acute distress HEENT: Head is normocephalic. Extraocular movements are intact. Mouth /Oropharynx- no visible tumor or thrush.  No concerning lesions.  Heart: RRR Chest: CTAB Neck: no cervical, submental, or supraclavicular adenopathy palpated. Psychiatric: Judgment and insight are intact. Affect is appropriate.   Lab Findings: Lab Results  Component Value Date   WBC 6.7 09/13/2022   HGB 13.2 09/13/2022   HCT 39.8 09/13/2022   MCV 92 09/13/2022   PLT 218 09/13/2022    Radiographic Findings: No results found.  Impression/Plan:     1) Head and Neck Cancer Status: In remission with no evidence of disease  2) Nutritional Status: Doing well overall with oral intake, she has lost some weight since her feeding tube was taken out. Her weight has been stable overall.    3) Risk Factors: The patient has been educated about risk factors including alcohol and tobacco abuse; they understand that avoidance of alcohol and tobacco is important to prevent recurrences as well as other cancers  4) Swallowing: functional with soft foods, unable to eat dry meats.  5) Dental: She is following closely with dental medicine at [facility] and understands that I can be contacted if they have any questions regarding prior  radiation doses to various tooth roots  6) Thyroid function:  on supplement via PCP Lab Results  Component Value Date   TSH 0.833 09/13/2022    7) Patient will continue close follow-up with Dr. Al Pimple and ENT. Radiation oncology follow-up PRN at this time, we are happy to see her again if deemed necessary. It was a pleasure taking part in this patient's care.   On date of service, in total, I spent 25 minutes on this encounter. Patient was seen in person.  This note was signed after date of service.  Time above is in reference to date of service only. _____________________________________   Joyice Faster, PA-C    Lonie Peak, MD

## 2023-01-31 NOTE — Telephone Encounter (Signed)
Rn attempted to call pt for meaningful use information without success. RN left message for pt with call back information.

## 2023-01-31 NOTE — Telephone Encounter (Signed)
Pt called RN back and Rn was able to complete meaningful use on Pt. Note completed and routed to Dr. Basilio Cairo and Quitman Livings PA-C for review.

## 2023-02-01 ENCOUNTER — Encounter: Payer: Self-pay | Admitting: Radiation Oncology

## 2023-03-01 NOTE — Progress Notes (Signed)
Cardiology Office Note:   Date:  03/02/2023  ID:  Tammy Brady, DOB April 29, 1951, MRN [id number] PCP: Marianne Sofia, PA-C  [facility] Providers Cardiologist:  Armanda Magic, MD    History of Present Illness:   Tammy Brady is a 72 y.o. female with history of CAD, hyperlipidemia, hypertension, carotid artery disease, COPD, prior tobacco use, SSCA of mouth.  Patient encountered cardiology in 2011 when admitted with NSTEMI. LHC found only mild, non-obstructive CAD and symptoms attributed to coronary vasospasm. She was treated with Cardizem and sublingual nitroglycerin. Prior to her last follow up with Dr. Mayford Knife in August of last year, patient had recurrence of chest pain, sharp in nature with jaw pain. Symptoms improved with nitroglycerin. No exertional component reported. A coronary CTA was ordered and patient found with calcium score of 448. RCA with diffuse minimal to mild mixed stenosis. LM normal. LAD with minimal mixed proximal stenosis. LCX with minimal mixed stenosis.   Patient has been doing very well since her last visit. She has been exercising 4-5 days per week without any chest pain or shortness of breath. She has not needed to take any nitroglycerin in the last year. Her only symptom has been some mild dizziness with very heavy exertion, worse on hotter days. She reports that she is tolerating medications well. Endorses intermittent nocturnal leg cramping that seems to be worse on days when she has exercised. She says that this has been going on for years and is not concerned.   Today patient denies chest pain, shortness of breath, lower extremity edema, fatigue, palpitations, melena, hematuria, hemoptysis, diaphoresis, weakness, presyncope, syncope, orthopnea, and PND.   Studies Reviewed:    EKG:   EKG Interpretation Date/Time:  Thursday March 02 2023 08:24:47 EDT Ventricular Rate:  55 PR Interval:  166 QRS Duration:  76 QT Interval:  440 QTC Calculation: 420 R  Axis:   88  Text Interpretation: Sinus bradycardia Nonspecific T wave abnormality When compared with ECG of 17-May-2021 05:15, PREVIOUS ECG IS PRESENT Confirmed by Perlie Gold [phone] on 03/02/2023 8:29:59 AM   INDINGS: Quality: Good, HR 41   Coronary calcium score: The patient's coronary artery calcium score is 448, which places the patient in the 92nd percentile. Calcium is noted throughout the RCA and proximal LAD.   Coronary arteries: Normal coronary origins.  Right dominance.   Right Coronary Artery: Dominant. Diffuse minimal to mild mixed stenoses (25-49%).   Left Main Coronary Artery: Normal. Bifurcates into the LAD and LCx arteries.   Left Anterior Descending Coronary Artery: Moderate-sized anterior artery that wraps around the apex. Minimal mixed 1-24% proximal stenosis. 2 small diagonal branches without disease.   Left Circumflex Artery: AV groove vessel which is small in caliber with minimal mixed proximal 1-24% stenosis. High OM branch without disease.   Aorta: Normal size, 30 mm at the mid ascending aorta (level of the PA bifurcation) measured double oblique. Aortic atherosclerosis. No dissection.   Aortic Valve: Trileaflet. No calcifications.   Other findings:   Normal pulmonary vein drainage into the left atrium.   Normal left atrial appendage without a thrombus.   Dilated main pulmonary artery at 31 mm, suggestive of pulmonary hypertension.   Tunneled central line port with tip in the right atrium.   IMPRESSION: 1. Minimal mixed non-obstructive CAD, CADRADS = 1.   2. Coronary calcium score of 448. This was 92nd percentile for age and sex matched control.   3. Normal coronary origin with right dominance.   4.  Dilated main pulmonary artery at 31 mm, suggestive of pulmonary hypertension.   5. Aggressive cardiovascular risk factor modification is recommended.   Risk Assessment/Calculations:              Physical Exam:   VS:  BP 120/68    Pulse (!) 55   Ht 5\' 5"  (1.651 m)   Wt 128 lb 9.6 oz (58.3 kg)   SpO2 98%   BMI 21.40 kg/m    Wt Readings from Last 3 Encounters:  03/02/23 128 lb 9.6 oz (58.3 kg)  01/31/23 128 lb 3.2 oz (58.2 kg)  09/13/22 129 lb 12.8 oz (58.9 kg)     Physical Exam   ASSESSMENT AND PLAN:   Obstruction of carotid artery Repeat doppler study in August of 2023 found generally stable disease: 1 to 39% right ICA stenosis and 40 to 59% left ICA stenosis. Will repeat annual imaging. Continue with ASA and statin.  CAD, NATIVE VESSEL Cardiac CTA in September 2023 showed calcium score of 448. RCA with diffuse minimal to mild mixed stenosis. LM normal. LAD with minimal mixed proximal stenosis. LCX with minimal mixed stenosis.  We will continue her aspirin and simvastatin. Discussed that LDL of 75 per March 2024 labs is just slightly higher than goal <70. Encouraged healthy fat/low saturated fat diet and to continue with regular exercise.   Hypertension, essential Blood pressure well controlled today. Continue with Cardizem CD 300mg  daily.   Coronary artery spasm [facility]) Patient with NSTEMI in 2011 attributed to coronary vasospasm. She has felt well in the last year, no recurrent chest pain on Cardizem CD 300mg . Continue PRN nitroglycerin. No changes.   Dizziness Patient reporting intermittent dizziness that she associated with heavy exertion. This seems to be worse on hot days. Given lack of dyspnea or angina, I suspect that this is a side effect of Cardizem CD. Discussed that this can slightly decrease exertional tolerance because it reduces HR and advised patient to thoroughly hydrate prior to exercise. I also encouraged her to avoid exertion in high heat when possible. Will continue to monitor.            Signed, Perlie Gold, PA-C

## 2023-03-01 NOTE — Assessment & Plan Note (Signed)
Repeat doppler study in August of 2023 found generally stable disease: 1 to 39% right ICA stenosis and 40 to 59% left ICA stenosis. Will repeat annual imaging. Continue with ASA and statin.

## 2023-03-01 NOTE — Assessment & Plan Note (Signed)
Cardiac CTA in September 2023 showed calcium score of 448. RCA with diffuse minimal to mild mixed stenosis. LM normal. LAD with minimal mixed proximal stenosis. LCX with minimal mixed stenosis.  We will continue her aspirin and simvastatin. Discussed that LDL of 75 per March 2024 labs is just slightly higher than goal <70. Encouraged healthy fat/low saturated fat diet and to continue with regular exercise.

## 2023-03-02 ENCOUNTER — Encounter: Payer: Self-pay | Admitting: Cardiology

## 2023-03-02 ENCOUNTER — Ambulatory Visit: Payer: Medicare Other | Attending: Cardiology | Admitting: Cardiology

## 2023-03-02 VITALS — BP 120/68 | HR 55 | Ht 65.0 in | Wt 128.6 lb

## 2023-03-02 DIAGNOSIS — I25111 Atherosclerotic heart disease of native coronary artery with angina pectoris with documented spasm: Secondary | ICD-10-CM | POA: Diagnosis not present

## 2023-03-02 DIAGNOSIS — E782 Mixed hyperlipidemia: Secondary | ICD-10-CM | POA: Diagnosis not present

## 2023-03-02 DIAGNOSIS — I6523 Occlusion and stenosis of bilateral carotid arteries: Secondary | ICD-10-CM | POA: Diagnosis not present

## 2023-03-02 DIAGNOSIS — I10 Essential (primary) hypertension: Secondary | ICD-10-CM

## 2023-03-02 DIAGNOSIS — R42 Dizziness and giddiness: Secondary | ICD-10-CM

## 2023-03-02 DIAGNOSIS — I2510 Atherosclerotic heart disease of native coronary artery without angina pectoris: Secondary | ICD-10-CM

## 2023-03-02 DIAGNOSIS — I201 Angina pectoris with documented spasm: Secondary | ICD-10-CM

## 2023-03-02 NOTE — Patient Instructions (Signed)
Medication Instructions:  Your physician recommends that you continue on your current medications as directed. Please refer to the Current Medication list given to you today.  *If you need a refill on your cardiac medications before your next appointment, please call your pharmacy*   Testing/Procedures: Your physician has requested that you have a carotid duplex. This test is an ultrasound of the carotid arteries in your neck. It looks at blood flow through these arteries that supply the brain with blood. Allow one hour for this exam. There are no restrictions or special instructions.    Follow-Up: At [facility], you and your health needs are our priority.  As part of our continuing mission to provide you with exceptional heart care, we have created designated Provider Care Teams.  These Care Teams include your primary Cardiologist (physician) and Advanced Practice Providers (APPs -  Physician Assistants and Nurse Practitioners) who all work together to provide you with the care you need, when you need it.   Your next appointment:   1 year(s)  Provider:   Armanda Magic, MD

## 2023-03-02 NOTE — Assessment & Plan Note (Signed)
Blood pressure well controlled today. Continue with Cardizem CD 300mg  daily.

## 2023-03-02 NOTE — Assessment & Plan Note (Signed)
Patient reporting intermittent dizziness that she associated with heavy exertion. This seems to be worse on hot days. Given lack of dyspnea or angina, I suspect that this is a side effect of Cardizem CD. Discussed that this can slightly decrease exertional tolerance because it reduces HR and advised patient to thoroughly hydrate prior to exercise. I also encouraged her to avoid exertion in high heat when possible. Will continue to monitor.

## 2023-03-02 NOTE — Assessment & Plan Note (Signed)
Patient with NSTEMI in 2011 attributed to coronary vasospasm. She has felt well in the last year, no recurrent chest pain on Cardizem CD 300mg . Continue PRN nitroglycerin. No changes.

## 2023-03-05 ENCOUNTER — Encounter: Payer: Self-pay | Admitting: Cardiology

## 2023-03-09 ENCOUNTER — Ambulatory Visit (HOSPITAL_COMMUNITY)
Admission: RE | Admit: 2023-03-09 | Discharge: 2023-03-09 | Disposition: A | Discharge status: Home / Self Care | Payer: Medicare Other | Source: Ambulatory Visit | Attending: Cardiology | Admitting: Cardiology

## 2023-03-09 DIAGNOSIS — I2510 Atherosclerotic heart disease of native coronary artery without angina pectoris: Secondary | ICD-10-CM | POA: Insufficient documentation

## 2023-03-09 DIAGNOSIS — I6523 Occlusion and stenosis of bilateral carotid arteries: Secondary | ICD-10-CM | POA: Diagnosis not present

## 2023-03-09 DIAGNOSIS — I201 Angina pectoris with documented spasm: Secondary | ICD-10-CM | POA: Diagnosis not present

## 2023-03-09 DIAGNOSIS — E782 Mixed hyperlipidemia: Secondary | ICD-10-CM | POA: Insufficient documentation

## 2023-03-09 DIAGNOSIS — I25111 Atherosclerotic heart disease of native coronary artery with angina pectoris with documented spasm: Secondary | ICD-10-CM

## 2023-03-09 DIAGNOSIS — I10 Essential (primary) hypertension: Secondary | ICD-10-CM

## 2023-03-10 ENCOUNTER — Other Ambulatory Visit: Payer: Self-pay | Admitting: Medical Genetics

## 2023-03-10 DIAGNOSIS — Z006 Encounter for examination for normal comparison and control in clinical research program: Secondary | ICD-10-CM

## 2023-03-18 ENCOUNTER — Other Ambulatory Visit: Payer: Self-pay | Admitting: Physician Assistant

## 2023-03-18 DIAGNOSIS — E038 Other specified hypothyroidism: Secondary | ICD-10-CM

## 2023-03-21 ENCOUNTER — Ambulatory Visit (INDEPENDENT_AMBULATORY_CARE_PROVIDER_SITE_OTHER): Payer: Medicare Other | Admitting: Physician Assistant

## 2023-03-21 ENCOUNTER — Encounter: Payer: Self-pay | Admitting: Physician Assistant

## 2023-03-21 VITALS — BP 114/62 | HR 64 | Temp 97.2°F | Resp 15 | Ht 65.0 in | Wt 126.8 lb

## 2023-03-21 DIAGNOSIS — D0000 Carcinoma in situ of oral cavity, unspecified site: Secondary | ICD-10-CM | POA: Diagnosis not present

## 2023-03-21 DIAGNOSIS — Z122 Encounter for screening for malignant neoplasm of respiratory organs: Secondary | ICD-10-CM | POA: Diagnosis not present

## 2023-03-21 DIAGNOSIS — E559 Vitamin D deficiency, unspecified: Secondary | ICD-10-CM | POA: Diagnosis not present

## 2023-03-21 DIAGNOSIS — Z23 Encounter for immunization: Secondary | ICD-10-CM

## 2023-03-21 DIAGNOSIS — I700 Atherosclerosis of aorta: Secondary | ICD-10-CM | POA: Diagnosis not present

## 2023-03-21 DIAGNOSIS — I10 Essential (primary) hypertension: Secondary | ICD-10-CM | POA: Diagnosis not present

## 2023-03-21 DIAGNOSIS — J432 Centrilobular emphysema: Secondary | ICD-10-CM | POA: Diagnosis not present

## 2023-03-21 DIAGNOSIS — E038 Other specified hypothyroidism: Secondary | ICD-10-CM

## 2023-03-21 MED ORDER — DILTIAZEM HCL ER COATED BEADS 240 MG PO CP24: 240.0000 mg | ORAL_CAPSULE | Freq: Every day | ORAL | 2 refills | Status: DC

## 2023-03-21 MED ORDER — TRELEGY ELLIPTA 100-62.5-25 MCG/ACT IN AEPB: 1.0000 | INHALATION_SPRAY | Freq: Every day | RESPIRATORY_TRACT | 5 refills | Status: DC

## 2023-03-21 MED ORDER — ALBUTEROL SULFATE HFA 108 (90 BASE) MCG/ACT IN AERS: 2.0000 | INHALATION_SPRAY | Freq: Four times a day (QID) | RESPIRATORY_TRACT | 5 refills | Status: DC | PRN

## 2023-03-21 NOTE — Progress Notes (Signed)
Subjective:  Patient ID: Tammy Brady, female    DOB: October 12, 1950  Age: 72 y.o. MRN: [id number]  Chief Complaint  Patient presents with   Medical Management of Chronic Issues    Hyperlipidemia  Hypertension    Pt presents for follow up of hypertension.  Patient states that she would like to try to lower bp medication - currently on diltiazem 300mg  - says bp at home running low 100s/60s and occasionally has some dizziness - she has recently seen cardiologist and had normal workup including carotid ultrasound Denies chest pain/dyspnea  Pt with history of hypothyroidism - currently on synthroid qd - due for labwork Currently having no symptoms  Pt with history of aortic atherosclerosis - currently on zocor 40mg  qd and taking asa 81mg  qd  Mixed hyperlipidemia  Pt presents with hyperlipidemia.  Compliance with treatment has been good -The patient is compliant with medications, maintains a low cholesterol diet , follows up as directed , and maintains an exercise regimen . The patient denies experiencing any hypercholesterolemia related symptoms. She is currently taking zocor 40mg  qd  Pt with history of emphysema.  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  She is still following with Head/Neck specialists in [facility].  She has appt with them next month and has been discharged from radiologist - she follows with oncologist yearly and next appt is in january  Pt would like flu shot today  Current Outpatient Medications on File Prior to Visit  Medication Sig Dispense Refill   acetaminophen (TYLENOL) 500 MG tablet Take 500 mg by mouth every 6 (six) hours as needed for mild pain.     aspirin EC 81 MG tablet Take 1 tablet (81  mg total) by mouth daily. Swallow whole. 90 tablet 3   calcium carbonate (OS-CAL) 600 MG TABS Take 600 mg by mouth daily.     levothyroxine (SYNTHROID) 88 MCG tablet TAKE 1 TABLET BY MOUTH DAILY  BEFORE BREAKFAST 100 tablet 0   nitroGLYCERIN (NITROSTAT) 0.4 MG SL tablet Place 1 tablet (0.4 mg total) under the tongue every 5 (five) minutes as needed. 25 tablet 6   simvastatin (ZOCOR) 40 MG tablet TAKE 1 TABLET BY MOUTH AT  BEDTIME 90 tablet 3   No current facility-administered medications on file prior to visit.   Past Medical History:  Diagnosis Date   Aortic atherosclerosis (HCC)    CAD (coronary artery disease)    Mild non-obstructive disease by cath January 2011. Coronary CTA showed  mild (25-49%) plaque in the RCA and minimal (<25%) coronary CTA 04/2021 with coronary Ca score 257.  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   Past Surgical History:  Procedure Laterality Date   APPENDECTOMY     EXCISION ORAL TUMOR  01/18/2021   see details [facility] Everywhere   IR IMAGING  GUIDED PORT INSERTION  03/02/2021   IR REMOVAL TUN ACCESS W/ PORT W/O FL MOD SED  08/23/2022    Family History  Problem Relation Age of Onset   Cancer Mother        esophageal   Heart disease Father    Coronary artery disease Father    Hyperlipidemia Sister    Hyperlipidemia Sister    Hyperlipidemia Brother    Hyperlipidemia Brother    Hyperlipidemia Brother    Coronary artery disease Maternal Grandmother    Social History   Socioeconomic History   Marital status: Married    Spouse name: [name]   Number of children: 1   Years of education: Not on file   Highest education level: Not  on file  Occupational History   Occupation: Geophysical data processor: [employer]  Tobacco Use   Smoking status: Former    Current packs/day: 0.00    Average packs/day: 1 pack/day for 40.0 years (40.0 ttl pk-yrs)    Types: Cigarettes    Start date: 05/11/1969    Quit date: 05/11/2009    Years since quitting: 13.8   Smokeless tobacco: Never  Vaping Use   Vaping status: Never Used  Substance and Sexual Activity   Alcohol use: Not Currently   Drug use: No   Sexual activity: Not Currently  Other Topics Concern   Not on file  Social History Narrative   Not on file   Social Determinants of Health   Financial Resource Strain: Low Risk  (03/21/2023)   Overall Financial Resource Strain (CARDIA)    Difficulty of Paying Living Expenses: Not hard at all  Food Insecurity: No Food Insecurity (03/21/2023)   Hunger Vital Sign    Worried About Running Out of Food in the Last Year: Never true    Ran Out of Food in the Last Year: Never true  Transportation Needs: No Transportation Needs (03/21/2023)   PRAPARE - Administrator, Civil Service (Medical): No    Lack of Transportation (Non-Medical): No  Physical Activity: Inactive (03/21/2023)   Exercise Vital Sign    Days of Exercise per Week: 0 days    Minutes of Exercise per Session: 0 min  Stress: No Stress Concern Present (03/21/2023)   [employer] of Occupational Health - Occupational Stress Questionnaire    Feeling of Stress : Only a little  Social Connections: Socially Integrated (03/21/2023)   Social Connection and Isolation Panel [NHANES]    Frequency of Communication with Friends and Family: More than three times a week    Frequency of Social Gatherings with Friends and Family: Three times a week    Attends Religious Services: More than 4 times per year    Active Member of Clubs or Organizations: Yes    Attends Banker Meetings: More than 4 times per year    Marital Status: Married       No visits with  results within 1 Day(s) from this visit.  Latest known visit with results is:  Office Visit on 09/13/2022  Component Date Value Ref Range Status   WBC 09/13/2022 6.7  3.4 - 10.8 x10E3/uL Final   RBC 09/13/2022 4.35  3.77 - 5.28 x10E6/uL Final   Hemoglobin 09/13/2022 13.2  11.1 - 15.9 g/dL Final   Hematocrit 96/10/5407 39.8  34.0 - 46.6 % Final   MCV 09/13/2022 92  79 - 97 fL Final   MCH 09/13/2022 30.3  26.6 - 33.0 pg Final   MCHC  09/13/2022 33.2  31.5 - 35.7 g/dL Final   RDW 44/07/270 13.3  11.7 - 15.4 % Final   Platelets 09/13/2022 218  150 - 450 x10E3/uL Final   Neutrophils 09/13/2022 72  Not Estab. % Final   Lymphs 09/13/2022 20  Not Estab. % Final   Monocytes 09/13/2022 7  Not Estab. % Final   Eos 09/13/2022 1  Not Estab. % Final   Basos 09/13/2022 0  Not Estab. % Final   Neutrophils Absolute 09/13/2022 4.8  1.4 - 7.0 x10E3/uL Final   Lymphocytes Absolute 09/13/2022 1.3  0.7 - 3.1 x10E3/uL Final   Monocytes Absolute 09/13/2022 0.5  0.1 - 0.9 x10E3/uL Final   EOS (ABSOLUTE) 09/13/2022 0.1  0.0 - 0.4 x10E3/uL Final   Basophils Absolute 09/13/2022 0.0  0.0 - 0.2 x10E3/uL Final   Immature Granulocytes 09/13/2022 0  Not Estab. % Final   Immature Grans (Abs) 09/13/2022 0.0  0.0 - 0.1 x10E3/uL Final   Glucose 09/13/2022 82  70 - 99 mg/dL Final   BUN 53/66/4403 16  8 - 27 mg/dL Final   Creatinine, Ser 09/13/2022 0.81  0.57 - 1.00 mg/dL Final   eGFR 47/42/5956 79  >59 mL/min/1.73 Final   BUN/Creatinine Ratio 09/13/2022 20  12 - 28 Final   Sodium 09/13/2022 138  134 - 144 mmol/L Final   Potassium 09/13/2022 5.6 (H)  3.5 - 5.2 mmol/L Final   Chloride 09/13/2022 101  96 - 106 mmol/L Final   CO2 09/13/2022 23  20 - 29 mmol/L Final   Calcium 09/13/2022 10.0  8.7 - 10.3 mg/dL Final   Total Protein 38/75/6433 6.4  6.0 - 8.5 g/dL Final   Albumin 29/51/8841 4.5  3.9 - 4.9 g/dL Final   Globulin, Total 09/13/2022 1.9  1.5 - 4.5 g/dL Final   Albumin/Globulin Ratio 09/13/2022 2.4 (H)  1.2 - 2.2  Final   Bilirubin Total 09/13/2022 0.3  0.0 - 1.2 mg/dL Final   Alkaline Phosphatase 09/13/2022 70  44 - 121 IU/L Final   AST 09/13/2022 24  0 - 40 IU/L Final   ALT 09/13/2022 14  0 - 32 IU/L Final   TSH 09/13/2022 0.833  0.450 - 4.500 uIU/mL Final   Cholesterol, Total 09/13/2022 165  100 - 199 mg/dL Final   Triglycerides 66/12/3014 97  0 - 149 mg/dL Final   HDL 07/19/3233 73  >39 mg/dL Final   VLDL Cholesterol Cal 09/13/2022 17  5 - 40 mg/dL Final   LDL Chol Calc (NIH) 09/13/2022 75  0 - 99 mg/dL Final   Chol/HDL Ratio 09/13/2022 2.3  0.0 - 4.4 ratio Final   Comment:                                   T. Chol/HDL Ratio                                             Men  Women                               1/2 Avg.Risk  3.4    3.3  Avg.Risk  5.0    4.4                                2X Avg.Risk  9.6    7.1                                3X Avg.Risk 23.4   11.0    Vit D, 25-Hydroxy 09/13/2022 68.9  30.0 - 100.0 ng/mL Final   Comment: Vitamin D deficiency has been defined by the Institute of Medicine and an Endocrine Society practice guideline as a level of serum 25-OH vitamin D less than 20 ng/mL (1,2). The Endocrine Society went on to further define vitamin D insufficiency as a level between 21 and 29 ng/mL (2). 1. IOM (Institute of Medicine). 2010. Dietary reference    intakes for calcium and D. [place]: The    [employer]. 2. Holick MF, Binkley [place], Bischoff-Ferrari HA, et al.    Evaluation, treatment, and prevention of vitamin D    deficiency: an Endocrine Society clinical practice    guideline. JCEM. 2011 Jul; 96(7):1911-30.    Urine Culture, Routine 09/13/2022 Final report   Final   Organism ID, Bacteria 09/13/2022 Comment   Final   Comment: Culture shows less than 10,000 colony forming units of bacteria per milliliter of urine. This colony count is not generally considered to be clinically significant.    Glucose, UA  09/13/2022 negative  negative mg/dL Final   Bilirubin, UA 05/07/2535 negative  negative Final   Ketones, POC UA 09/13/2022 negative  negative mg/dL Final   Spec Grav, UA 64/40/3474 1.015  1.010 - 1.025 Final   Blood, UA 09/13/2022 trace-intact (A)  negative Final   pH, UA 09/13/2022 6.0  5.0 - 8.0 Final   POC PROTEIN,UA 09/13/2022 negative  negative, trace Final   Urobilinogen, UA 09/13/2022 negative (A)  0.2 or 1.0 E.U./dL Final   Nitrite, UA 25/95/6387 Negative  Negative Final   Leukocytes, UA 09/13/2022 Trace (A)  Negative Final   Interpretation 09/13/2022 Note   Final   Supplemental report is available.    CONSTITUTIONAL: Negative for chills, fatigue, fever, unintentional weight gain and unintentional weight loss.  E/N/T: Negative for ear pain, nasal congestion and sore throat.  CARDIOVASCULAR: Negative for chest pain, dizziness, palpitations and pedal edema.  RESPIRATORY: see HPI GASTROINTESTINAL: Negative for abdominal pain, acid reflux symptoms, constipation, diarrhea, nausea and vomiting.  MSK: Negative for arthralgias and myalgias.  INTEGUMENTARY: Negative for rash.  NEUROLOGICAL: Negative for dizziness and headaches.  PSYCHIATRIC: Negative for sleep disturbance and to question depression screen.  Negative for depression, negative for anhedonia.       Objective:  PHYSICAL EXAM:   VS: BP 114/62 (BP Location: Right Arm, Patient Position: Sitting, Cuff Size: Normal)   Pulse 64   Temp (!) 97.2 F (36.2 C) (Temporal)   Resp 15   Ht 5\' 5"  (1.651 m)   Wt 126 lb 12.8 oz (57.5 kg)   SpO2 98%   BMI 21.10 kg/m   GEN: Well nourished, well developed, in no acute distress   Neck: no JVD or masses - no thyromegaly Cardiac: RRR; no murmurs, rubs, or gallops,no edema -  Respiratory:  normal respiratory rate and pattern with no distress - normal breath sounds with no rales, rhonchi, wheezes or rubs  MS: no deformity or atrophy  Skin: warm and dry, no rash  Neuro:  Alert and  Oriented x 3, - CN II-Xii grossly intact Psych: euthymic mood, appropriate affect and demeanor  No visits with results within 1 Day(s) from this visit.  Latest known visit with results is:  Office Visit on 09/13/2022  Component Date Value Ref Range Status   WBC 09/13/2022 6.7  3.4 - 10.8 x10E3/uL Final   RBC 09/13/2022 4.35  3.77 - 5.28 x10E6/uL Final   Hemoglobin 09/13/2022 13.2  11.1 - 15.9 g/dL Final   Hematocrit 16/04/9603 39.8  34.0 - 46.6 % Final   MCV 09/13/2022 92  79 - 97 fL Final   MCH 09/13/2022 30.3  26.6 - 33.0 pg Final   MCHC 09/13/2022 33.2  31.5 - 35.7 g/dL Final   RDW 54/03/8118 13.3  11.7 - 15.4 % Final   Platelets 09/13/2022 218  150 - 450 x10E3/uL Final   Neutrophils 09/13/2022 72  Not Estab. % Final   Lymphs 09/13/2022 20  Not Estab. % Final   Monocytes 09/13/2022 7  Not Estab. % Final   Eos 09/13/2022 1  Not Estab. % Final   Basos 09/13/2022 0  Not Estab. % Final   Neutrophils Absolute 09/13/2022 4.8  1.4 - 7.0 x10E3/uL Final   Lymphocytes Absolute 09/13/2022 1.3  0.7 - 3.1 x10E3/uL Final   Monocytes Absolute 09/13/2022 0.5  0.1 - 0.9 x10E3/uL Final   EOS (ABSOLUTE) 09/13/2022 0.1  0.0 - 0.4 x10E3/uL Final   Basophils Absolute 09/13/2022 0.0  0.0 - 0.2 x10E3/uL Final   Immature Granulocytes 09/13/2022 0  Not Estab. % Final   Immature Grans (Abs) 09/13/2022 0.0  0.0 - 0.1 x10E3/uL Final   Glucose 09/13/2022 82  70 - 99 mg/dL Final   BUN 14/78/2956 16  8 - 27 mg/dL Final   Creatinine, Ser 09/13/2022 0.81  0.57 - 1.00 mg/dL Final   eGFR 21/30/8657 79  >59 mL/min/1.73 Final   BUN/Creatinine Ratio 09/13/2022 20  12 - 28 Final   Sodium 09/13/2022 138  134 - 144 mmol/L Final   Potassium 09/13/2022 5.6 (H)  3.5 - 5.2 mmol/L Final   Chloride 09/13/2022 101  96 - 106 mmol/L Final   CO2 09/13/2022 23  20 - 29 mmol/L Final   Calcium 09/13/2022 10.0  8.7 - 10.3 mg/dL Final   Total Protein 84/69/6295 6.4  6.0 - 8.5 g/dL Final   Albumin 28/41/3244 4.5  3.9 - 4.9 g/dL  Final   Globulin, Total 09/13/2022 1.9  1.5 - 4.5 g/dL Final   Albumin/Globulin Ratio 09/13/2022 2.4 (H)  1.2 - 2.2 Final   Bilirubin Total 09/13/2022 0.3  0.0 - 1.2 mg/dL Final   Alkaline Phosphatase 09/13/2022 70  44 - 121 IU/L Final   AST 09/13/2022 24  0 - 40 IU/L Final   ALT 09/13/2022 14  0 - 32 IU/L Final   TSH 09/13/2022 0.833  0.450 - 4.500 uIU/mL Final   Cholesterol, Total 09/13/2022 165  100 - 199 mg/dL Final   Triglycerides 07/13/7251 97  0 - 149 mg/dL Final   HDL 66/44/0347 73  >39 mg/dL Final   VLDL Cholesterol Cal 09/13/2022 17  5 - 40 mg/dL Final   LDL Chol Calc (NIH) 09/13/2022 75  0 - 99 mg/dL Final   Chol/HDL Ratio 09/13/2022 2.3  0.0 - 4.4 ratio Final   Comment:  T. Chol/HDL Ratio                                             Men  Women                               1/2 Avg.Risk  3.4    3.3                                   Avg.Risk  5.0    4.4                                2X Avg.Risk  9.6    7.1                                3X Avg.Risk 23.4   11.0    Vit D, 25-Hydroxy 09/13/2022 68.9  30.0 - 100.0 ng/mL Final   Comment: Vitamin D deficiency has been defined by the Institute of Medicine and an Endocrine Society practice guideline as a level of serum 25-OH vitamin D less than 20 ng/mL (1,2). The Endocrine Society went on to further define vitamin D insufficiency as a level between 21 and 29 ng/mL (2). 1. IOM (Institute of Medicine). 2010. Dietary reference    intakes for calcium and D. [place]: The    [employer]. 2. Holick MF, Binkley , Bischoff-Ferrari HA, et al.    Evaluation, treatment, and prevention of vitamin D    deficiency: an Endocrine Society clinical practice    guideline. JCEM. 2011 Jul; 96(7):1911-30.    Urine Culture, Routine 09/13/2022 Final report   Final   Organism ID, Bacteria 09/13/2022 Comment   Final   Comment: Culture shows less than 10,000 colony forming units of bacteria  per milliliter of urine. This colony count is not generally considered to be clinically significant.    Glucose, UA 09/13/2022 negative  negative mg/dL Final   Bilirubin, UA 16/04/9603 negative  negative Final   Ketones, POC UA 09/13/2022 negative  negative mg/dL Final   Spec Grav, UA 54/03/8118 1.015  1.010 - 1.025 Final   Blood, UA 09/13/2022 trace-intact (A)  negative Final   pH, UA 09/13/2022 6.0  5.0 - 8.0 Final   POC PROTEIN,UA 09/13/2022 negative  negative, trace Final   Urobilinogen, UA 09/13/2022 negative (A)  0.2 or 1.0 E.U./dL Final   Nitrite, UA 14/78/2956 Negative  Negative Final   Leukocytes, UA 09/13/2022 Trace (A)  Negative Final   Interpretation 09/13/2022 Note   Final   Supplemental report is available.    Lab Results  Component Value Date   WBC 6.7 09/13/2022   HGB 13.2 09/13/2022   HCT 39.8 09/13/2022   PLT 218 09/13/2022   GLUCOSE 82 09/13/2022   CHOL 165 09/13/2022   TRIG 97 09/13/2022   HDL 73 09/13/2022   LDLCALC 75 09/13/2022   ALT 14 09/13/2022   AST 24 09/13/2022   NA 138 09/13/2022   K 5.6 (H) 09/13/2022   CL 101 09/13/2022   CREATININE 0.81 09/13/2022   BUN 16 09/13/2022   CO2 23 09/13/2022  TSH 0.833 09/13/2022   INR 0.99 07/16/2009   HGBA1C  07/15/2009    5.1 (NOTE) The ADA recommends the following therapeutic goal for glycemic control related to Hgb A1c measurement: Goal of therapy: <6.5 Hgb A1c  Reference: American Diabetes Association: Clinical Practice Recommendations 2010, Diabetes Care, 2010, 33: (Suppl  1).      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  Meds ordered this encounter  Medications   Fluticasone-Umeclidin-Vilant (TRELEGY ELLIPTA) 100-62.5-25 MCG/ACT AEPB    Sig: Inhale 1 puff into the lungs daily.    Dispense:  1 each    Refill:  5    Order Specific Question:   Supervising Provider    Answer:   Corey Harold   albuterol (VENTOLIN HFA) 108 (90 Base) MCG/ACT inhaler    Sig: Inhale 2 puffs into the lungs every 6 (six) hours as needed for wheezing or shortness of breath.    Dispense:  8 g    Refill:  5    Order Specific Question:   Supervising Provider    Answer:   Corey Harold   diltiazem (CARDIZEM CD) 240 MG 24 hr capsule    Sig: Take 1 capsule (240 mg total) by mouth daily.    Dispense:  30 capsule    Refill:  2    Order Specific Question:   Supervising Provider    Answer:   Corey Harold     Orders Placed This Encounter  Procedures   CT CHEST LUNG CA SCREEN LOW DOSE W/O CM   Flu Vaccine Trivalent High Dose (Fluad)   CBC with Differential/Platelet   Comprehensive metabolic panel   TSH   Lipid panel   VITAMIN D 25 Hydroxy (Vit-D Deficiency, Fractures)     Follow-up: Return in about 6 months (around 09/18/2023) for chronic fasting follow-up- 3 weeks bp check.  An After Visit Summary was printed and given to the patient.  Jettie Pagan [facility] [phone]

## 2023-03-22 ENCOUNTER — Other Ambulatory Visit: Payer: Self-pay

## 2023-03-22 DIAGNOSIS — Z122 Encounter for screening for malignant neoplasm of respiratory organs: Secondary | ICD-10-CM

## 2023-03-22 LAB — CBC WITH DIFFERENTIAL/PLATELET
Basophils Absolute: 0 10*3/uL (ref 0.0–0.2)
Basos: 0 %
EOS (ABSOLUTE): 0.2 10*3/uL (ref 0.0–0.4)
Eos: 3 %
Hematocrit: 39.8 % (ref 34.0–46.6)
Hemoglobin: 13 g/dL (ref 11.1–15.9)
Immature Grans (Abs): 0 10*3/uL (ref 0.0–0.1)
Immature Granulocytes: 0 %
Lymphocytes Absolute: 0.9 10*3/uL (ref 0.7–3.1)
Lymphs: 11 %
MCH: 30.3 pg (ref 26.6–33.0)
MCHC: 32.7 g/dL (ref 31.5–35.7)
MCV: 93 fL (ref 79–97)
Monocytes Absolute: 0.6 10*3/uL (ref 0.1–0.9)
Monocytes: 7 %
Neutrophils Absolute: 6.1 10*3/uL (ref 1.4–7.0)
Neutrophils: 79 %
Platelets: 261 10*3/uL (ref 150–450)
RBC: 4.29 x10E6/uL (ref 3.77–5.28)
RDW: 12.9 % (ref 11.7–15.4)
WBC: 7.8 10*3/uL (ref 3.4–10.8)

## 2023-03-22 LAB — COMPREHENSIVE METABOLIC PANEL
ALT: 15 IU/L (ref 0–32)
AST: 29 IU/L (ref 0–40)
Albumin: 4.2 g/dL (ref 3.9–4.9)
Alkaline Phosphatase: 70 IU/L (ref 44–121)
BUN/Creatinine Ratio: 19 (ref 12–28)
BUN: 14 mg/dL (ref 8–27)
Bilirubin Total: 0.2 mg/dL (ref 0.0–1.2)
CO2: 21 mmol/L (ref 20–29)
Calcium: 9.6 mg/dL (ref 8.7–10.3)
Chloride: 105 mmol/L (ref 96–106)
Creatinine, Ser: 0.75 mg/dL (ref 0.57–1.00)
Globulin, Total: 2.1 g/dL (ref 1.5–4.5)
Glucose: 84 mg/dL (ref 70–99)
Potassium: 4.5 mmol/L (ref 3.5–5.2)
Sodium: 138 mmol/L (ref 134–144)
Total Protein: 6.3 g/dL (ref 6.0–8.5)
eGFR: 86 mL/min/{1.73_m2} (ref >59)

## 2023-03-22 LAB — LIPID PANEL
Chol/HDL Ratio: 1.9 ratio (ref 0.0–4.4)
Cholesterol, Total: 140 mg/dL (ref 100–199)
HDL: 75 mg/dL (ref >39)
LDL Chol Calc (NIH): 48 mg/dL (ref 0–99)
Triglycerides: 92 mg/dL (ref 0–149)
VLDL Cholesterol Cal: 17 mg/dL (ref 5–40)

## 2023-03-22 LAB — VITAMIN D 25 HYDROXY (VIT D DEFICIENCY, FRACTURES): Vit D, 25-Hydroxy: 65.7 ng/mL (ref 30.0–100.0)

## 2023-03-22 LAB — TSH: TSH: 0.501 u[IU]/mL (ref 0.450–4.500)

## 2023-03-27 ENCOUNTER — Telehealth: Payer: Self-pay

## 2023-03-27 DIAGNOSIS — Z87891 Personal history of nicotine dependence: Secondary | ICD-10-CM | POA: Diagnosis not present

## 2023-03-27 DIAGNOSIS — I700 Atherosclerosis of aorta: Secondary | ICD-10-CM | POA: Diagnosis not present

## 2023-03-27 DIAGNOSIS — Z122 Encounter for screening for malignant neoplasm of respiratory organs: Secondary | ICD-10-CM | POA: Diagnosis not present

## 2023-03-27 DIAGNOSIS — I2510 Atherosclerotic heart disease of native coronary artery without angina pectoris: Secondary | ICD-10-CM | POA: Diagnosis not present

## 2023-03-27 NOTE — Telephone Encounter (Signed)
[facility] called and they are changing generics on the levothyroxine.  Marianne Sofia, PA approved and [facility] was notified.

## 2023-03-28 ENCOUNTER — Telehealth: Payer: Self-pay

## 2023-03-28 NOTE — Telephone Encounter (Signed)
Order given by Eber Jones to [facility] okay to give levothyroxine from new manufacturer.

## 2023-04-10 ENCOUNTER — Encounter: Payer: Self-pay | Admitting: Physician Assistant

## 2023-04-11 ENCOUNTER — Ambulatory Visit: Payer: Medicare Other

## 2023-04-18 ENCOUNTER — Ambulatory Visit: Payer: Medicare Other

## 2023-04-18 VITALS — BP 134/78 | HR 68

## 2023-04-18 DIAGNOSIS — I10 Essential (primary) hypertension: Secondary | ICD-10-CM

## 2023-04-18 NOTE — Progress Notes (Signed)
Patient is in office today for a nurse visit for Blood Pressure Check. Patient's blood pressure 134/78, pulse 68.  She has been doing well with medication changes.  She feels that her bp would be even lower had she not used her albuterol inhaler 30 minutes before coming in.

## 2023-04-18 NOTE — Patient Instructions (Signed)
Continue current medication as prescribed. Keep scheduled appointment in November.

## 2023-04-19 DIAGNOSIS — C031 Malignant neoplasm of lower gum: Secondary | ICD-10-CM | POA: Diagnosis not present

## 2023-04-19 DIAGNOSIS — C760 Malignant neoplasm of head, face and neck: Secondary | ICD-10-CM | POA: Diagnosis not present

## 2023-04-26 ENCOUNTER — Other Ambulatory Visit: Payer: Self-pay | Admitting: Physician Assistant

## 2023-04-26 DIAGNOSIS — I10 Essential (primary) hypertension: Secondary | ICD-10-CM

## 2023-05-10 ENCOUNTER — Other Ambulatory Visit (HOSPITAL_COMMUNITY)
Admission: RE | Admit: 2023-05-10 | Discharge: 2023-05-10 | Disposition: A | Discharge status: Home / Self Care | Payer: Medicare Other | Source: Ambulatory Visit | Attending: Medical Genetics | Admitting: Medical Genetics

## 2023-05-10 DIAGNOSIS — Z006 Encounter for examination for normal comparison and control in clinical research program: Secondary | ICD-10-CM | POA: Insufficient documentation

## 2023-05-16 LAB — HELIX MOLECULAR SCREEN: Genetic Analysis Overall Interpretation: NEGATIVE

## 2023-05-16 LAB — GENECONNECT MOLECULAR SCREEN

## 2023-05-18 LAB — LAB REPORT - SCANNED: Microalb Creat Ratio: NORMAL

## 2023-05-30 ENCOUNTER — Ambulatory Visit (INDEPENDENT_AMBULATORY_CARE_PROVIDER_SITE_OTHER): Payer: Medicare Other | Admitting: Family Medicine

## 2023-05-30 ENCOUNTER — Encounter: Payer: Self-pay | Admitting: Family Medicine

## 2023-05-30 VITALS — BP 122/78 | HR 72 | Temp 98.0°F | Resp 16 | Ht 65.0 in | Wt 124.8 lb

## 2023-05-30 DIAGNOSIS — Z0000 Encounter for general adult medical examination without abnormal findings: Secondary | ICD-10-CM | POA: Diagnosis not present

## 2023-05-30 DIAGNOSIS — Z1231 Encounter for screening mammogram for malignant neoplasm of breast: Secondary | ICD-10-CM

## 2023-05-30 NOTE — Progress Notes (Signed)
Subjective:   Tammy Brady is a 72 y.o. female who presents for Medicare Annual (Subsequent) preventive examination.  Visit Complete: In person  Patient Medicare AWV questionnaire was completed by the patient on 05/30/2023; I have confirmed that all information answered by patient is correct and no changes since this date.        Objective:    Today's Vitals   05/30/23 1111  BP: 122/78  Pulse: 72  Resp: 16  Temp: 98 F (36.7 C)  Weight: 124 lb 12.8 oz (56.6 kg)  Height: 5\' 5"  (1.651 m)   Body mass index is 20.77 kg/m.     05/30/2023   11:28 AM 01/31/2023   11:33 AM 01/25/2022    3:08 PM 05/25/2021    3:30 PM 05/17/2021    4:30 AM 05/07/2021   11:05 AM 03/31/2021    9:33 AM  Advanced Directives  Does Patient Have a Medical Advance Directive? Yes Yes Yes Yes No Yes Yes  Type of Estate agent of [place];Living will  Healthcare Power of [place];Living will Healthcare Power of [employer] of [place];Living will   Does patient want to make changes to medical advance directive?   No - Patient declined      Copy of Healthcare Power of Attorney in Chart?   No - copy requested No - copy requested   No - copy requested  Would patient like information on creating a medical advance directive?     No - Patient declined      Current Medications (verified) Outpatient Encounter Medications as of 05/30/2023  Medication Sig   acetaminophen (TYLENOL) 500 MG tablet Take 500 mg by mouth every 6 (six) hours as needed for mild pain.   albuterol (VENTOLIN HFA) 108 (90 Base) MCG/ACT inhaler Inhale 2 puffs into the lungs every 6 (six) hours as needed for wheezing or shortness of breath.   aspirin EC 81 MG tablet Take 1 tablet (81 mg total) by mouth daily. Swallow whole.   calcium carbonate (OS-CAL) 600 MG TABS Take 600 mg by mouth daily.   diltiazem (CARDIZEM CD) 240 MG 24 hr capsule TAKE 1 CAPSULE BY MOUTH DAILY   Fluticasone-Umeclidin-Vilant (TRELEGY  ELLIPTA) 100-62.5-25 MCG/ACT AEPB Inhale 1 puff into the lungs daily.   levothyroxine (SYNTHROID) 88 MCG tablet TAKE 1 TABLET BY MOUTH DAILY  BEFORE BREAKFAST   nitroGLYCERIN (NITROSTAT) 0.4 MG SL tablet Place 1 tablet (0.4 mg total) under the tongue every 5 (five) minutes as needed.   simvastatin (ZOCOR) 40 MG tablet TAKE 1 TABLET BY MOUTH AT  BEDTIME   No facility-administered encounter medications on file as of 05/30/2023.    Allergies (verified) Codeine and Oxycodone   History: Past Medical History:  Diagnosis Date   Aortic atherosclerosis (HCC)    CAD (coronary artery disease)    Mild non-obstructive disease by cath January 2011. Coronary CTA showed  mild (25-49%) plaque in the RCA and minimal (<25%) coronary CTA 04/2021 with coronary Ca score 257.  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   Past Surgical History:  Procedure Laterality Date   APPENDECTOMY     EXCISION ORAL TUMOR  01/18/2021   see details [facility] Care Everywhere   IR IMAGING GUIDED PORT INSERTION  03/02/2021   IR REMOVAL TUN ACCESS W/ PORT W/O FL MOD SED  08/23/2022   Family History  Problem Relation Age of Onset   Cancer Mother        esophageal   Heart disease Father    Coronary artery disease Father    Hyperlipidemia Sister    Hyperlipidemia Sister    Hyperlipidemia Brother    Hyperlipidemia Brother    Hyperlipidemia Brother    Coronary artery disease Maternal Grandmother    Social History   Socioeconomic History   Marital status: Married    Spouse name: [name]   Number of children: 1   Years of education: Not on file   Highest education level: Not on file  Occupational History    Occupation: Geophysical data processor: [employer]  Tobacco Use   Smoking status: Former    Current packs/day: 0.00    Average packs/day: 1 pack/day for 40.0 years (40.0 ttl pk-yrs)    Types: Cigarettes    Start date: 05/11/1969    Quit date: 05/11/2009    Years since quitting: 14.0   Smokeless tobacco: Never  Vaping Use   Vaping status: Never Used  Substance and Sexual Activity   Alcohol use: Not Currently   Drug use: No   Sexual activity: Not Currently  Other Topics Concern   Not on file  Social History Narrative   Not on file   Social Determinants of Health   Financial Resource Strain: Low Risk  (03/21/2023)   Overall Financial Resource Strain (CARDIA)    Difficulty of Paying Living Expenses: Not hard at all  Food Insecurity: No Food Insecurity (03/21/2023)   Hunger Vital Sign    Worried About Running Out of Food in the Last Year: Never true    Ran Out of Food in the Last Year: Never true  Transportation Needs: No Transportation Needs (03/21/2023)   PRAPARE - Administrator, Civil Service (Medical): No    Lack of Transportation (Non-Medical): No  Physical Activity: Inactive (03/21/2023)   Exercise Vital Sign    Days of Exercise per Week: 0 days    Minutes of Exercise per Session: 0 min  Stress: No Stress Concern Present (03/21/2023)   [employer] of Occupational Health - Occupational Stress Questionnaire    Feeling of Stress : Only a little  Social Connections: Socially Integrated (03/21/2023)   Social Connection and Isolation Panel [NHANES]    Frequency of Communication with Friends and Family: More than three times a week    Frequency of Social Gatherings with Friends and Family: Three times a week    Attends Religious Services: More than 4 times per year    Active Member of Clubs or Organizations: Yes    Attends Engineer, structural: More than 4 times per year    Marital Status: Married    Tobacco Counseling Counseling given: Not  Answered   Clinical Intake:  Pre-visit preparation completed: Yes  Pain : No/denies pain     BMI - recorded: 20.77 Nutritional Status: BMI of 19-24  Normal Nutritional Risks: None Diabetes: No  How often do you need to have someone help you when you read instructions, pamphlets, or other written materials from your doctor or pharmacy?: 1 - Never  Interpreter Needed?: No  Activities of Daily Living    05/26/2023    9:22 AM  In your present state of health, do you have any difficulty performing the following activities:  Hearing? 0  Vision? 0  Difficulty concentrating or making decisions? 0  Walking or climbing stairs? 0  Dressing or bathing? 0  Doing errands, shopping? 0  Preparing Food and eating ? N  Using the Toilet? N  In the past six months, have you accidently leaked urine? N  Do you have problems with loss of bowel control? N  Managing your Medications? N  Managing your Finances? N  Housekeeping or managing your Housekeeping? N    Patient Care Team: Marianne Sofia, PA-C as PCP - General (Physician Assistant) Quintella Reichert, MD as PCP - Cardiology (Cardiology) Malmfelt, Lise Auer, RN as Oncology Nurse Navigator Lonie Peak, MD as Consulting Physician (Radiation Oncology) Rachel Moulds, MD as Consulting Physician (Hematology and Oncology) Zettie Pho, [facility] (Inactive) as Pharmacist (Pharmacist)  Indicate any recent Medical Services you may have received from other than [facility] providers in the past year (date may be approximate).     Assessment:   This is a routine wellness examination for [employer].  Hearing/Vision screen No results found.   Goals Addressed             This Visit's Progress    Activity and Exercise Increased       Evidence-based guidance:  Review current exercise levels.  Assess patient perspective on exercise or activity level, barriers to increasing activity, motivation and readiness for change.  Recommend or set  healthy exercise goal based on individual tolerance.  Encourage small steps toward making change in amount of exercise or activity.  Urge reduction of sedentary activities or screen time.  Promote group activities within the community or with family or support person.  Consider referral to rehabiliation therapist for assessment and exercise/activity plan.   Notes: Work out 4 times a week @ home on her [place] and walks her dog about a 1/2 mile/ day       Depression Screen    05/30/2023   11:18 AM 03/21/2023    8:15 AM 05/30/2022   11:05 AM 05/25/2021   10:38 AM  PHQ 2/9 Scores  PHQ - 2 Score 0 0 1 1  PHQ- 9 Score 0 1      Fall Risk    05/30/2023   11:29 AM 05/30/2023   11:18 AM 05/26/2023    9:22 AM 03/21/2023    8:12 AM 05/26/2022   11:04 AM  Fall Risk   Falls in the past year? 0 0 0 0 1  Number falls in past yr: 0 0 0 0 0  Injury with Fall? 0 0 0 0 0  Risk for fall due to : History of fall(s) No Fall Risks  No Fall Risks No Fall Risks  Follow up Falls evaluation completed Falls evaluation completed  Falls evaluation completed Falls evaluation completed;Education provided    MEDICARE RISK AT HOME: Medicare Risk at Home Any stairs in or around the home?: (P) Yes If so, are there any without handrails?: (P) No Home free of loose throw rugs in walkways, pet beds, electrical cords, etc?: (P) Yes Adequate lighting in your home to reduce risk of falls?: (P) Yes Life alert?: (P) No Use of a cane, walker or w/c?: (P) No Grab bars in the bathroom?: (P) No Shower chair or bench in shower?: (P) Yes Elevated toilet seat or a handicapped toilet?: (  P) Yes  TIMED UP AND GO:  Was the test performed?  Yes  Length of time to ambulate 10 feet: 6 sec Gait steady and fast without use of assistive device    Cognitive Function:        05/30/2023   11:30 AM 05/30/2022   11:06 AM  6CIT Screen  What Year?  0 points  What month? 0 points 0 points  What time? 0 points 0 points   Count back from 20 0 points 0 points  Months in reverse 0 points 0 points  Repeat phrase 0 points 0 points  Total Score  0 points    Immunizations Immunization History  Administered Date(s) Administered   Fluad Quad(high Dose 65+) 07/30/2020, 05/25/2021, 05/26/2022   Fluad Trivalent(High Dose 65+) 03/21/2023   Moderna Sars-Covid-2 Vaccination 09/05/2019, 10/03/2019   Pfizer Covid-19 Vaccine Bivalent Booster 32yrs & up 05/25/2021   Pneumococcal Conjugate-13 06/25/2018   Pneumococcal Polysaccharide-23 07/01/2019    TDAP status: Due, Education has been provided regarding the importance of this vaccine. Advised may receive this vaccine at local pharmacy or Health Dept. Aware to provide a copy of the vaccination record if obtained from local pharmacy or Health Dept. Verbalized acceptance and understanding.  Flu Vaccine status: Completed at today's visit - 03/21/23  Pneumococcal vaccine status: Up to date  Covid-19 vaccine status: Completed vaccines patient has had 3 already  Qualifies for Shingles Vaccine? Yes   Zostavax completed No   Shingrix Completed?: No.    Education has been provided regarding the importance of this vaccine. Patient has been advised to call insurance company to determine out of pocket expense if they have not yet received this vaccine. Advised may also receive vaccine at local pharmacy or Health Dept. Verbalized acceptance and understanding.  Screening Tests Health Maintenance  Topic Date Due   DEXA SCAN  09/02/2023   MAMMOGRAM  09/07/2023   Lung Cancer Screening  03/26/2024   Medicare Annual Wellness (AWV)  05/29/2024   Colonoscopy  04/09/2028   Pneumonia Vaccine 44+ Years old  Completed   INFLUENZA VACCINE  Completed   HPV VACCINES  Aged Out   DTaP/Tdap/Td  Discontinued   COVID-19 Vaccine  Discontinued   Hepatitis C Screening  Discontinued   Zoster Vaccines- Shingrix  Discontinued    Health Maintenance  There are no preventive care reminders to  display for this patient.   Colorectal cancer screening: Type of screening: Colonoscopy. Completed 03/23/2018. Repeat every 10 years  Mammogram status: Completed 09/01/21. Repeat every year  Bone Density status: Completed 09/01/21. Results reflect: Bone density results: NORMAL. Repeat every 2 years.  Lung Cancer Screening: (Low Dose CT Chest recommended if Age 43-80 years, 20 pack-year currently smoking OR have quit w/in 15years.) does qualify.   Lung Cancer Screening Referral: completed last month  Additional Screening:  Hepatitis C Screening: does qualify; Completed declines  Vision Screening: Recommended annual ophthalmology exams for early detection of glaucoma and other disorders of the eye. Is the patient up to date with their annual eye exam?  Yes  Who is the provider or what is the name of the office in which the patient attends annual eye exams? [facility] If pt is not established with a provider, would they like to be referred to a provider to establish care? No .   Dental Screening: Recommended annual dental exams for proper oral hygiene  Community Resource Referral / Chronic Care Management: CRR required this visit?  No   CCM required  this visit?  No     Plan:     I have personally reviewed and noted the following in the patient's chart:   Medical and social history Use of alcohol, tobacco or illicit drugs  Current medications and supplements including opioid prescriptions. Patient is not currently taking opioid prescriptions. Functional ability and status Nutritional status Physical activity Advanced directives List of other physicians Hospitalizations, surgeries, and ER visits in previous 12 months Vitals Screenings to include cognitive, depression, and falls Referrals and appointments  In addition, I have reviewed and discussed with patient certain preventive protocols, quality metrics, and best practice recommendations. A written personalized  care plan for preventive services as well as general preventive health recommendations were provided to patient.     Lajuana Matte, FNP [facility] [phone]   05/30/2023   After Visit Summary: (MyChart) Due to this being a telephonic visit, the after visit summary with patients personalized plan was offered to patient via MyChart

## 2023-06-02 ENCOUNTER — Other Ambulatory Visit: Payer: Self-pay | Admitting: Physician Assistant

## 2023-06-02 DIAGNOSIS — E038 Other specified hypothyroidism: Secondary | ICD-10-CM

## 2023-07-15 ENCOUNTER — Other Ambulatory Visit: Payer: Self-pay | Admitting: Physician Assistant

## 2023-07-15 DIAGNOSIS — E782 Mixed hyperlipidemia: Secondary | ICD-10-CM

## 2023-07-24 ENCOUNTER — Encounter: Payer: Self-pay | Admitting: Hematology and Oncology

## 2023-07-24 ENCOUNTER — Inpatient Hospital Stay: Payer: Medicare Other | Attending: Hematology and Oncology | Admitting: Hematology and Oncology

## 2023-07-24 VITALS — BP 127/44 | HR 62 | Temp 98.5°F | Resp 17 | Wt 125.4 lb

## 2023-07-24 DIAGNOSIS — Z800 Family history of malignant neoplasm of digestive organs: Secondary | ICD-10-CM | POA: Insufficient documentation

## 2023-07-24 DIAGNOSIS — E039 Hypothyroidism, unspecified: Secondary | ICD-10-CM | POA: Diagnosis not present

## 2023-07-24 DIAGNOSIS — C069 Malignant neoplasm of mouth, unspecified: Secondary | ICD-10-CM | POA: Diagnosis not present

## 2023-07-24 DIAGNOSIS — Z9221 Personal history of antineoplastic chemotherapy: Secondary | ICD-10-CM | POA: Diagnosis not present

## 2023-07-24 DIAGNOSIS — R1310 Dysphagia, unspecified: Secondary | ICD-10-CM | POA: Insufficient documentation

## 2023-07-24 DIAGNOSIS — Z79890 Hormone replacement therapy (postmenopausal): Secondary | ICD-10-CM | POA: Insufficient documentation

## 2023-07-24 DIAGNOSIS — C060 Malignant neoplasm of cheek mucosa: Secondary | ICD-10-CM | POA: Insufficient documentation

## 2023-07-24 DIAGNOSIS — Z923 Personal history of irradiation: Secondary | ICD-10-CM | POA: Insufficient documentation

## 2023-07-24 DIAGNOSIS — Z87891 Personal history of nicotine dependence: Secondary | ICD-10-CM | POA: Diagnosis not present

## 2023-07-24 NOTE — Assessment & Plan Note (Signed)
 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  Her pathologic staging is pT3 N3B.  Given positive margins and presence of extracapsular extension,she completed 6 weeks of chemoradiation. She is here for her annual follow-up. Her primary care doctor has been managing her hypothyroidism and she is on levothyroxine .    Post-Therapy for Head and Neck Cancer No current complaints. Oral cavity appears healthy on examination. Difficulty swallowing certain foods (lettuce, bread, chicken) due to treatment effects. -Continue current management strategies for swallowing difficulties.  Hypothyroidism On Levothyroxine , managed by PA Lauraine Moats. -Continue Levothyroxine  as prescribed.  Lung Cancer Screening Recent CT chest for lung cancer screening was negative. -Continue annual lung cancer screening.  Follow-up Annual follow-up unless new concerns arise.

## 2023-07-24 NOTE — Progress Notes (Signed)
 Tammy Brady:    Tammy Credit, PA-C 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  Her pathologic staging is pT3 N3B.   02/19/2021 Cancer Staging   Staging form: Oral Cavity, AJCC 8th Edition - Pathologic stage from 02/19/2021: Stage IVB (pT3, pN3b, cM0) - Signed by Izell Domino, MD on 02/20/2021 Stage prefix: Initial diagnosis   03/08/2021 - 04/19/2021 Radiation Therapy   Radiation therapy given concurrently with weekly Cisplatin    03/10/2021 - 04/15/2021 Chemotherapy   Patient is on Treatment Plan :  HEAD/NECK Cisplatin  q7d  x 6 cycles     CURRENT THERAPY: s/p chemoradiation  INTERVAL HISTORY:  Tammy Brady 73 y.o. female returns for evaluation and follow-Brady of her head neck cancer.  The patient, with a history of cancer, presents for a routine follow-Brady appointment. She reports that she has been doing well overall, with no new or worsening symptoms. She has been active, traveling and overseeing the construction of her new house. She has also experienced a significant personal loss with the passing of her sister. The patient is currently on levothyroxine , which she takes daily. She reports some difficulty swallowing certain foods,  such as lettuce and bread, but is otherwise able to eat without issue. She also mentions that she has been maintaining her physical fitness by attending a fitness center four days a week. She saw Dr Barbie at [facility], no concerns. CT lung cancer screening negative. Rest of the pertinent 10 point ROS reviewed and negative.  Patient Active Problem List   Diagnosis Date Noted   Dizziness 03/02/2023   Malaise 02/28/2022   Hematuria, microscopic 02/28/2022   Tick bite of abdomen 02/28/2022   Weight loss 02/07/2022   New daily persistent headache 01/19/2022   Vitamin D  insufficiency 12/20/2021   Centrilobular emphysema (HCC) 12/20/2021   History of radiation to head and neck region 06/25/2021   Encounter for dental examination 06/25/2021   Xerostomia due to radiotherapy 06/25/2021   Dysgeusia 06/25/2021   Trismus 06/25/2021   Encounter for general adult medical examination with abnormal findings 06/06/2021   Encounter for osteoporosis screening in asymptomatic postmenopausal patient 06/06/2021   Visit for screening mammogram 06/06/2021   Need for immunization against influenza 06/06/2021   Symptomatic anemia 05/18/2021   CAP (community acquired pneumonia) 05/17/2021   Normocytic anemia 05/17/2021   Moderate protein-calorie malnutrition (HCC) 05/17/2021   Hyponatremia 05/17/2021   G tube feedings (HCC) 04/29/2021   Aortic atherosclerosis (HCC) 04/25/2021   Chest pain of uncertain etiology 04/07/2021   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 chemotherapy (HCC) 04/07/2021   Mucositis due to antineoplastic therapy 03/31/2021   Drug induced constipation 03/31/2021   Chemotherapy-induced nausea 03/31/2021   Port-A-Cath in place 03/09/2021   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  02/22/2021   Carcinoma of lower gum (HCC) 02/20/2021   Squamous cell carcinoma of mouth (HCC) 12/08/2020   Gastritis 12/08/2020   Anxiety 12/08/2020   Need for prophylactic vaccination and inoculation against  influenza 07/30/2020   Depression 11/26/2015   GERD (gastroesophageal reflux disease) 11/26/2015   Migraine headache 11/26/2015   Screening for diabetes mellitus 09/03/2015   Hypothyroidism 07/22/2009   Mixed hyperlipidemia 07/22/2009   Hypertension, essential 07/22/2009   CAD, NATIVE VESSEL 07/22/2009   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   Coronary artery spasm (HCC) 07/22/2009    is allergic to codeine and oxycodone .  MEDICAL HISTORY: Past Medical History:  Diagnosis Date   Aortic atherosclerosis (HCC)    CAD (coronary artery disease)    Mild non-obstructive disease by cath January 2011. Coronary CTA showed  mild (25-49%) plaque in the RCA and minimal (<25%) coronary CTA 04/2021 with coronary Ca score 257.  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    SURGICAL HISTORY: Past Surgical History:  Procedure Laterality Date   APPENDECTOMY     EXCISION ORAL TUMOR  01/18/2021   see details [facility] Everywhere   IR IMAGING GUIDED PORT INSERTION  03/02/2021   IR REMOVAL TUN ACCESS W/ PORT W/O FL MOD SED  08/23/2022    SOCIAL HISTORY: Social History   Socioeconomic History   Marital status: Married    Spouse name: [name]   Number of children: 1   Years of education: Not on file   Highest education level: Not on file  Occupational History   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  Tobacco Use   Smoking status: Former    Current packs/day: 0.00    Average packs/day: 1 pack/day for 40.0 years (40.0 ttl pk-yrs)    Types: Cigarettes    Start date: 05/11/1969    Quit date: 05/11/2009    Years since quitting: 14.2   Smokeless tobacco: Never   Vaping Use   Vaping status: Never Used  Substance and Sexual Activity   Alcohol use: Not Currently   Drug use: No   Sexual activity: Not Currently  Other Topics Concern   Not on file  Social History Narrative   Not on file   Social Drivers of Health   Financial Resource Strain: Low Risk  (03/21/2023)   Overall Financial Resource Strain (CARDIA)    Difficulty of Paying Living Expenses: Not hard at all  Food Insecurity: No Food Insecurity (03/21/2023)   Hunger Vital Sign    Worried About Running Out of Food in the Last Year: Never true    Ran Out of Food in the Last Year: Never true  Transportation Needs: No Transportation Needs (03/21/2023)   PRAPARE - Administrator, Civil Service (Medical): No    Lack of Transportation (Non-Medical): No  Physical Activity: Inactive (03/21/2023)   Exercise Vital Sign    Days of Exercise per Week: 0 days    Minutes of Exercise per Session: 0 min  Stress: No Stress Concern Present (03/21/2023)   [employer] of Occupational Health - Occupational Stress  Questionnaire    Feeling of Stress : Only a little  Social Connections: Socially Integrated (03/21/2023)   Social Connection and Isolation Panel [NHANES]    Frequency of Communication with Friends and Family: More than three times a week    Frequency of Social Gatherings with Friends and Family: Three times a week    Attends Religious Services: More than 4 times per year    Active Member of Clubs or Organizations: Yes    Attends Banker Meetings: More than 4 times per year    Marital Status: Married  Catering Manager Violence: Not At Risk (03/21/2023)   Humiliation, Afraid, Rape, and Kick questionnaire    Fear of Current or Ex-Partner: No    Emotionally Abused: No    Physically Abused: No    Sexually Abused: No    FAMILY HISTORY: Family History  Problem Relation Age of Onset   Cancer Mother        esophageal   Heart disease Father    Coronary artery disease  Father    Hyperlipidemia Sister    Hyperlipidemia Sister    Hyperlipidemia Brother    Hyperlipidemia Brother    Hyperlipidemia Brother    Coronary artery disease Maternal Grandmother     Review of Systems  Constitutional:  Negative for appetite change, chills, fatigue, fever and unexpected weight change.  HENT:   Negative for hearing loss, lump/mass and trouble swallowing.   Eyes:  Negative for eye problems and icterus.  Respiratory:  Negative for chest tightness, cough and shortness of breath.   Cardiovascular:  Negative for chest pain, leg swelling and palpitations.  Gastrointestinal:  Negative for abdominal distention, abdominal pain, constipation, diarrhea, nausea and vomiting.  Endocrine: Negative for hot flashes.  Genitourinary:  Negative for difficulty urinating.   Musculoskeletal:  Negative for arthralgias.  Skin:  Negative for itching and rash.  Neurological:  Negative for dizziness, extremity weakness, headaches and numbness.  Hematological:  Negative for adenopathy. Does not bruise/bleed easily.  Psychiatric/Behavioral:  Negative for depression. The patient is not nervous/anxious.       PHYSICAL EXAMINATION  ECOG PERFORMANCE STATUS: 1 - Symptomatic but completely ambulatory  Vitals:   07/24/23 1134  BP: (!) 127/44  Pulse: 62  Resp: 17  Temp: 98.5 F (36.9 C)  SpO2: 98%    Physical Exam Constitutional:      General: She is not in acute distress.    Appearance: Normal appearance. She is not toxic-appearing.  HENT:     Head: Normocephalic and atraumatic.     Mouth/Throat:     Comments: Graft appears healthy.  NO lesions Eyes:     General: No scleral icterus. Cardiovascular:     Rate and Rhythm: Normal rate and regular rhythm.     Pulses: Normal pulses.     Heart sounds: Normal heart sounds.  Pulmonary:     Effort: Pulmonary effort is normal.     Breath sounds: Normal breath sounds.  Abdominal:     General: Abdomen is flat. Bowel sounds are normal.  There is no distension.     Palpations: Abdomen is soft.     Tenderness: There is no abdominal tenderness.  Musculoskeletal:        General: No swelling.     Cervical back: Neck supple.  Lymphadenopathy:     Cervical: No cervical adenopathy.  Skin:    General: Skin is warm and dry.     Findings: No rash.  Neurological:     General:  No focal deficit present.     Mental Status: She is alert.  Psychiatric:        Mood and Affect: Mood normal.        Behavior: Behavior normal.     LABORATORY DATA:  CBC    Component Value Date/Time   WBC 7.8 03/21/2023 0841   WBC 5.6 07/19/2021 0848   WBC 4.7 05/18/2021 0516   RBC 4.29 03/21/2023 0841   RBC 3.79 (L) 07/19/2021 0848   HGB 13.0 03/21/2023 0841   HCT 39.8 03/21/2023 0841   PLT 261 03/21/2023 0841   MCV 93 03/21/2023 0841   MCH 30.3 03/21/2023 0841   MCH 31.1 07/19/2021 0848   MCHC 32.7 03/21/2023 0841   MCHC 32.6 07/19/2021 0848   RDW 12.9 03/21/2023 0841   LYMPHSABS 0.9 03/21/2023 0841   MONOABS 0.5 07/19/2021 0848   EOSABS 0.2 03/21/2023 0841   BASOSABS 0.0 03/21/2023 0841    CMP     Component Value Date/Time   NA 138 03/21/2023 0841   K 4.5 03/21/2023 0841   CL 105 03/21/2023 0841   CO2 21 03/21/2023 0841   GLUCOSE 84 03/21/2023 0841   GLUCOSE 98 07/19/2021 0848   BUN 14 03/21/2023 0841   CREATININE 0.75 03/21/2023 0841   CREATININE 0.84 07/19/2021 0848   CALCIUM  9.6 03/21/2023 0841   PROT 6.3 03/21/2023 0841   ALBUMIN 4.2 03/21/2023 0841   AST 29 03/21/2023 0841   AST 19 07/19/2021 0848   ALT 15 03/21/2023 0841   ALT 12 07/19/2021 0848   ALKPHOS 70 03/21/2023 0841   BILITOT 0.2 03/21/2023 0841   BILITOT 0.3 07/19/2021 0848   GFRNONAA >60 07/19/2021 0848   GFRAA 83 08/26/2020 0927    I have reviewed her imaging results from care everywhere.  ASSESSMENT and PLAN:   Squamous cell carcinoma of mouth (HCC) This is a very pleasant 73 year old female patient with new diagnosis of squamous cell  carcinoma of the oral cavity status post surgery on January 18, 2021 with pathology showing a 3.3 cm tumor, grade 2 squamous cell carcinoma of the alveolar ridge, negative margins, 6 out of 49 lymph nodes positive bilaterally with extracapsular extension referred to medical oncology for consideration of adjuvant chemotherapy.  Her pathologic staging is pT3 N3B.  Given positive margins and presence of extracapsular extension,she completed 6 weeks of chemoradiation. She is here for her annual follow-Brady. Her primary care doctor has been managing her hypothyroidism and she is on levothyroxine .    Post-Therapy for Head and Neck Cancer No current complaints. Oral cavity appears healthy on examination. Difficulty swallowing certain foods (lettuce, bread, chicken) due to treatment effects. -Continue current management strategies for swallowing difficulties.  Hypothyroidism On Levothyroxine , managed by PA Tammy Brady. -Continue Levothyroxine  as prescribed.  Lung Cancer Screening Recent CT chest for lung cancer screening was negative. -Continue annual lung cancer screening.  Follow-Brady Annual follow-Brady unless new concerns arise.   Total encounter time: 30 minutes in face-to-face visit time, chart review, lab review, care coordination, and documentation of the encounter.  We have discussed about surveillance recommendations, advancing G-tube feedings, expected recovery time and follow-Brady. *Total Encounter Time as defined by the Centers for Medicare and Medicaid Services includes, in addition to the face-to-face time of a patient visit (documented in the note above) non-face-to-face time: obtaining and reviewing outside history, ordering and reviewing medications, tests or procedures, care coordination (communications with other health care professionals or caregivers) and documentation in the medical record.

## 2023-08-02 ENCOUNTER — Other Ambulatory Visit: Payer: Self-pay | Admitting: Family Medicine

## 2023-08-02 DIAGNOSIS — I10 Essential (primary) hypertension: Secondary | ICD-10-CM

## 2023-08-20 ENCOUNTER — Other Ambulatory Visit: Payer: Self-pay | Admitting: Physician Assistant

## 2023-08-20 DIAGNOSIS — J432 Centrilobular emphysema: Secondary | ICD-10-CM

## 2023-09-21 ENCOUNTER — Ambulatory Visit: Payer: Medicare Other | Admitting: Physician Assistant

## 2023-09-25 ENCOUNTER — Encounter: Payer: Self-pay | Admitting: Physician Assistant

## 2023-09-25 ENCOUNTER — Ambulatory Visit (INDEPENDENT_AMBULATORY_CARE_PROVIDER_SITE_OTHER): Payer: Medicare Other | Admitting: Physician Assistant

## 2023-09-25 VITALS — BP 118/74 | HR 60 | Temp 97.5°F | Resp 18 | Ht 65.5 in | Wt 123.6 lb

## 2023-09-25 DIAGNOSIS — J432 Centrilobular emphysema: Secondary | ICD-10-CM

## 2023-09-25 DIAGNOSIS — E038 Other specified hypothyroidism: Secondary | ICD-10-CM | POA: Diagnosis not present

## 2023-09-25 DIAGNOSIS — D0000 Carcinoma in situ of oral cavity, unspecified site: Secondary | ICD-10-CM | POA: Diagnosis not present

## 2023-09-25 DIAGNOSIS — I700 Atherosclerosis of aorta: Secondary | ICD-10-CM | POA: Diagnosis not present

## 2023-09-25 DIAGNOSIS — E559 Vitamin D deficiency, unspecified: Secondary | ICD-10-CM | POA: Diagnosis not present

## 2023-09-25 DIAGNOSIS — I10 Essential (primary) hypertension: Secondary | ICD-10-CM | POA: Diagnosis not present

## 2023-09-25 DIAGNOSIS — N959 Unspecified menopausal and perimenopausal disorder: Secondary | ICD-10-CM | POA: Insufficient documentation

## 2023-09-25 MED ORDER — TRELEGY ELLIPTA 100-62.5-25 MCG/ACT IN AEPB: 1.0000 | INHALATION_SPRAY | Freq: Every day | RESPIRATORY_TRACT | 3 refills | Status: AC

## 2023-09-25 NOTE — Progress Notes (Signed)
 Subjective:  Patient ID: Tammy Brady, female    DOB: 1950-10-26  Age: 73 y.o. MRN: [id number]  Chief Complaint  Patient presents with   Medical Management of Chronic Issues    Hyperlipidemia  Hypertension    Pt presents for follow up of hypertension.  Patient states that she would like to try to lower bp medication - currently on diltiazem 240mg  - bp at home has been doing well on this dose - bp today in office 118/74 Denies chest pain/dyspnea/edema  Pt with history of hypothyroidism - currently on synthroid qd - due for labwork Currently having no symptoms  Pt with history of aortic atherosclerosis - currently on zocor 40mg  qd and taking asa 81mg  qd - last lipid panel normal Follows with cardiology regularly  Mixed hyperlipidemia  Pt presents with hyperlipidemia.  Compliance with treatment has been good -The patient is compliant with medications, maintains a low cholesterol diet , follows up as directed , and maintains an exercise regimen . The patient denies experiencing any hypercholesterolemia related symptoms. She is currently taking zocor 40mg  qd  Pt with history of emphysema.  Pt is up to date on yearly lung screening Requests refill of Trelegy - working well for her and also uses albuterol as needed  Pt with history of squamous cell carcinoma of oral cavity with surgical resection.  She is still following with Head/Neck specialists in [facility].  She has appt with them in May  and has been discharged from radiologist - she follows with oncologist yearly  Pt is due for dexa scan - would like to schedule  Current Outpatient Medications on File Prior to Visit  Medication Sig Dispense Refill   acetaminophen (TYLENOL) 500 MG tablet Take 500 mg by mouth every 6 (six) hours as needed for mild pain.     albuterol (VENTOLIN HFA) 108 (90 Base) MCG/ACT inhaler Inhale 2 puffs into the lungs every 6 (six) hours as needed for wheezing or shortness of breath. 8 g 5   aspirin EC  81 MG tablet Take 1 tablet (81 mg total) by mouth daily. Swallow whole. 90 tablet 3   calcium carbonate (OS-CAL) 600 MG TABS Take 600 mg by mouth daily.     diltiazem (CARDIZEM CD) 240 MG 24 hr capsule TAKE 1 CAPSULE BY MOUTH DAILY 90 capsule 1   levothyroxine (SYNTHROID) 88 MCG tablet TAKE 1 TABLET BY MOUTH DAILY  BEFORE BREAKFAST 100 tablet 2   nitroGLYCERIN (NITROSTAT) 0.4 MG SL tablet Place 1 tablet (0.4 mg total) under the tongue every 5 (five) minutes as needed. 25 tablet 6   simvastatin (ZOCOR) 40 MG tablet TAKE 1 TABLET BY MOUTH AT  BEDTIME 100 tablet 1   No current facility-administered medications on file prior to visit.   Past Medical History:  Diagnosis Date   Aortic atherosclerosis (HCC)    CAD (coronary artery disease)    Mild non-obstructive disease by cath January 2011. Coronary CTA showed  mild (25-49%) plaque in the RCA and minimal (<25%) coronary CTA 04/2021 with coronary Ca score 257.  Cor Ca score 448 with 25-49% RCA and <25% pLCx and pLAD by coronary CTA 03/2022   Carotid artery disease (HCC)    1 to 39% right ICA stenosis and 40 to 59% left ICA stenosis by Dopplers 02/2022   GERD (gastroesophageal reflux disease)    Hyperlipidemia    Hypertension    Hypothyroidism    Squamous cell carcinoma of mouth (HCC)    s/p  resection and XRT and chemo   Past Surgical History:  Procedure Laterality Date   APPENDECTOMY     EXCISION ORAL TUMOR  01/18/2021   see details [facility] Everywhere   IR IMAGING GUIDED PORT INSERTION  03/02/2021   IR REMOVAL TUN ACCESS W/ PORT W/O FL MOD SED  08/23/2022    Family History  Problem Relation Age of Onset   Cancer Mother        esophageal   Heart disease Father    Coronary artery disease Father    Hyperlipidemia Sister    Hyperlipidemia Sister    Hyperlipidemia Brother    Hyperlipidemia Brother    Hyperlipidemia Brother    Coronary artery disease Maternal Grandmother    Social History   Socioeconomic History   Marital status:  Married    Spouse name: [name]   Number of children: 1   Years of education: Not on file   Highest education level: 12th grade  Occupational History   Occupation: Geophysical data processor: [employer]  Tobacco Use   Smoking status: Former    Current packs/day: 0.00    Average packs/day: 1 pack/day for 40.0 years (40.0 ttl pk-yrs)    Types: Cigarettes    Start date: 05/11/1969    Quit date: 05/11/2009    Years since quitting: 14.3   Smokeless tobacco: Never  Vaping Use   Vaping status: Never Used  Substance and Sexual Activity   Alcohol use: Not Currently   Drug use: No   Sexual activity: Not Currently  Other Topics Concern   Not on file  Social History Narrative   Not on file   Social Drivers of Health   Financial Resource Strain: Low Risk  (09/21/2023)   Overall Financial Resource Strain (CARDIA)    Difficulty of Paying Living Expenses: Not hard at all  Food Insecurity: No Food Insecurity (09/21/2023)   Hunger Vital Sign    Worried About Running Out of Food in the Last Year: Never true    Ran Out of Food in the Last Year: Never true  Transportation Needs: No Transportation Needs (09/21/2023)   PRAPARE - Administrator, Civil Service (Medical): No    Lack of Transportation (Non-Medical): No  Physical Activity: Insufficiently Active (09/21/2023)   Exercise Vital Sign    Days of Exercise per Week: 4 days    Minutes of Exercise per Session: 30 min  Stress: No Stress Concern Present (09/21/2023)   [employer] of Occupational Health - Occupational Stress Questionnaire    Feeling of Stress : Not at all  Social Connections: Socially Integrated (09/21/2023)   Social Connection and Isolation Panel [NHANES]    Frequency of Communication with Friends and Family: More than three times a week    Frequency of Social Gatherings with Friends and Family: Three times a week    Attends Religious Services: More than 4 times per year    Active Member of Clubs or  Organizations: Yes    Attends Banker Meetings: More than 4 times per year    Marital Status: Married       No visits with results within 1 Day(s) from this visit.  Latest known visit with results is:  Scanned Document on 05/29/2023  Component Date Value Ref Range Status   Microalb Creat Ratio 05/18/2023 Normal   Final   Albumin/creatinine ratio - Abstracted by HIM    CONSTITUTIONAL: Negative for chills, fatigue, fever, unintentional weight gain and unintentional  weight loss.  E/N/T: Negative for ear pain, nasal congestion and sore throat.  CARDIOVASCULAR: Negative for chest pain, dizziness, palpitations and pedal edema.  RESPIRATORY: Negative for recent cough and dyspnea.  GASTROINTESTINAL: Negative for abdominal pain, acid reflux symptoms, constipation, diarrhea, nausea and vomiting.  MSK: Negative for arthralgias and myalgias.  INTEGUMENTARY: Negative for rash.  NEUROLOGICAL: Negative for dizziness and headaches.  PSYCHIATRIC: Negative for sleep disturbance and to question depression screen.  Negative for depression, negative for anhedonia.       Objective:  PHYSICAL EXAM:   VS: BP 118/74 (BP Location: Right Arm, Patient Position: Sitting, Cuff Size: Normal)   Pulse 60   Temp (!) 97.5 F (36.4 C) (Temporal)   Resp 18   Ht 5' 5.5" (1.664 m)   Wt 123 lb 9.6 oz (56.1 kg)   SpO2 100%   BMI 20.26 kg/m   GEN: Well nourished, well developed, in no acute distress  Cardiac: RRR; no murmurs, rubs, or gallops,no edema -  Respiratory:  normal respiratory rate and pattern with no distress - normal breath sounds with no rales, rhonchi, wheezes or rubs  MS: no deformity or atrophy  Skin: warm and dry, no rash  Neuro:  Alert and Oriented x 3- CN II-Xii grossly intact Psych: euthymic mood, appropriate affect and demeanor   No visits with results within 1 Day(s) from this visit.  Latest known visit with results is:  Scanned Document on 05/29/2023  Component Date  Value Ref Range Status   Microalb Creat Ratio 05/18/2023 Normal   Final   Albumin/creatinine ratio - Abstracted by HIM    Lab Results  Component Value Date   WBC 7.8 03/21/2023   HGB 13.0 03/21/2023   HCT 39.8 03/21/2023   PLT 261 03/21/2023   GLUCOSE 84 03/21/2023   CHOL 140 03/21/2023   TRIG 92 03/21/2023   HDL 75 03/21/2023   LDLCALC 48 03/21/2023   ALT 15 03/21/2023   AST 29 03/21/2023   NA 138 03/21/2023   K 4.5 03/21/2023   CL 105 03/21/2023   CREATININE 0.75 03/21/2023   BUN 14 03/21/2023   CO2 21 03/21/2023   TSH 0.501 03/21/2023   INR 0.99 07/16/2009   HGBA1C  07/15/2009    5.1 (NOTE) The ADA recommends the following therapeutic goal for glycemic control related to Hgb A1c measurement: Goal of therapy: <6.5 Hgb A1c  Reference: American Diabetes Association: Clinical Practice Recommendations 2010, Diabetes Care, 2010, 33: (Suppl  1).      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  Meds ordered this encounter  Medications   Fluticasone-Umeclidin-Vilant (TRELEGY  ELLIPTA) 100-62.5-25 MCG/ACT AEPB    Sig: Take 1 Inhalation by mouth daily.    Dispense:  3 each    Refill:  3    Please send a replace/new response with 100-Day Supply if  appropriate to maximize member benefit. Requesting 1 year supply.    Supervising Provider:   Blane Ohara [phone]     Orders Placed This Encounter  Procedures   DG Bone Density   CBC with Differential/Platelet   Comprehensive metabolic panel   TSH   Lipid panel   VITAMIN D 25 Hydroxy (Vit-D Deficiency, Fractures)     Follow-up: Return in about 6 months (around 03/27/2024) for chronic fasting follow-up.  An After Visit Summary was printed and given to the patient.  Jettie Pagan [facility] [phone]

## 2023-09-26 ENCOUNTER — Encounter: Payer: Self-pay | Admitting: Physician Assistant

## 2023-09-26 LAB — CBC WITH DIFFERENTIAL/PLATELET
Basophils Absolute: 0.1 10*3/uL (ref 0.0–0.2)
Basos: 1 %
EOS (ABSOLUTE): 0.1 10*3/uL (ref 0.0–0.4)
Eos: 1 %
Hematocrit: 40.4 % (ref 34.0–46.6)
Hemoglobin: 13.1 g/dL (ref 11.1–15.9)
Immature Grans (Abs): 0 10*3/uL (ref 0.0–0.1)
Immature Granulocytes: 0 %
Lymphocytes Absolute: 1.2 10*3/uL (ref 0.7–3.1)
Lymphs: 19 %
MCH: 30.5 pg (ref 26.6–33.0)
MCHC: 32.4 g/dL (ref 31.5–35.7)
MCV: 94 fL (ref 79–97)
Monocytes Absolute: 0.6 10*3/uL (ref 0.1–0.9)
Monocytes: 9 %
Neutrophils Absolute: 4.3 10*3/uL (ref 1.4–7.0)
Neutrophils: 70 %
Platelets: 223 10*3/uL (ref 150–450)
RBC: 4.3 x10E6/uL (ref 3.77–5.28)
RDW: 13.3 % (ref 11.7–15.4)
WBC: 6.2 10*3/uL (ref 3.4–10.8)

## 2023-09-26 LAB — LIPID PANEL
Chol/HDL Ratio: 1.9 ratio (ref 0.0–4.4)
Cholesterol, Total: 159 mg/dL (ref 100–199)
HDL: 84 mg/dL (ref >39)
LDL Chol Calc (NIH): 63 mg/dL (ref 0–99)
Triglycerides: 61 mg/dL (ref 0–149)
VLDL Cholesterol Cal: 12 mg/dL (ref 5–40)

## 2023-09-26 LAB — COMPREHENSIVE METABOLIC PANEL
ALT: 14 IU/L (ref 0–32)
AST: 26 IU/L (ref 0–40)
Albumin: 4.4 g/dL (ref 3.9–4.9)
Alkaline Phosphatase: 63 IU/L (ref 44–121)
BUN/Creatinine Ratio: 20 (ref 12–28)
BUN: 15 mg/dL (ref 8–27)
Bilirubin Total: 0.3 mg/dL (ref 0.0–1.2)
CO2: 24 mmol/L (ref 20–29)
Calcium: 9.4 mg/dL (ref 8.7–10.3)
Chloride: 105 mmol/L (ref 96–106)
Creatinine, Ser: 0.75 mg/dL (ref 0.57–1.00)
Globulin, Total: 1.9 g/dL (ref 1.5–4.5)
Glucose: 74 mg/dL (ref 70–99)
Potassium: 5.3 mmol/L — ABNORMAL HIGH (ref 3.5–5.2)
Sodium: 142 mmol/L (ref 134–144)
Total Protein: 6.3 g/dL (ref 6.0–8.5)
eGFR: 86 mL/min/{1.73_m2} (ref >59)

## 2023-09-26 LAB — TSH: TSH: 1.87 u[IU]/mL (ref 0.450–4.500)

## 2023-09-26 LAB — VITAMIN D 25 HYDROXY (VIT D DEFICIENCY, FRACTURES): Vit D, 25-Hydroxy: 52 ng/mL (ref 30.0–100.0)

## 2023-10-03 ENCOUNTER — Ambulatory Visit
Admission: RE | Admit: 2023-10-03 | Discharge: 2023-10-03 | Disposition: A | Discharge status: Home / Self Care | Payer: Medicare Other | Source: Ambulatory Visit | Attending: Family Medicine | Admitting: Family Medicine

## 2023-10-03 DIAGNOSIS — Z1231 Encounter for screening mammogram for malignant neoplasm of breast: Secondary | ICD-10-CM | POA: Diagnosis not present

## 2023-10-06 ENCOUNTER — Other Ambulatory Visit: Payer: Self-pay | Admitting: Family Medicine

## 2023-10-06 DIAGNOSIS — I10 Essential (primary) hypertension: Secondary | ICD-10-CM

## 2023-10-19 DIAGNOSIS — Z780 Asymptomatic menopausal state: Secondary | ICD-10-CM | POA: Diagnosis not present

## 2023-10-19 DIAGNOSIS — Z13820 Encounter for screening for osteoporosis: Secondary | ICD-10-CM | POA: Diagnosis not present

## 2023-10-19 LAB — HM DEXA SCAN: HM Dexa Scan: NORMAL

## 2023-11-22 DIAGNOSIS — C060 Malignant neoplasm of cheek mucosa: Secondary | ICD-10-CM | POA: Diagnosis not present

## 2023-11-22 DIAGNOSIS — C760 Malignant neoplasm of head, face and neck: Secondary | ICD-10-CM | POA: Diagnosis not present

## 2024-01-01 ENCOUNTER — Telehealth: Payer: Self-pay | Admitting: Physician Assistant

## 2024-01-01 DIAGNOSIS — I10 Essential (primary) hypertension: Secondary | ICD-10-CM

## 2024-01-01 MED ORDER — DILTIAZEM HCL ER COATED BEADS 240 MG PO CP24: 240.0000 mg | ORAL_CAPSULE | Freq: Every day | ORAL | 1 refills | Status: DC

## 2024-01-01 NOTE — Telephone Encounter (Signed)
 Copied from CRM [id number]. Topic: Clinical - Medication Refill >> Jan 01, 2024  1:35 PM Ameerah G wrote: Medication: diltiazem  (CARDIZEM  CD) 240 MG 24 hr capsule [[id number]]  Has the patient contacted their pharmacy? Yes (Agent: If no, request that the patient contact the pharmacy for the refill. If patient does not wish to contact the pharmacy document the reason why and proceed with request.) (Agent: If yes, when and what did the pharmacy advise?)  [facility] [address] - [place], [place] - [address] [place] [address] Phone: [phone] Fax: [phone]  Is this the correct pharmacy for this prescription? Yes If no, delete pharmacy and type the correct one.   Has the prescription been filled recently? No  Is the patient out of the medication? Yes  Has the patient been seen for an appointment in the last year OR does the patient have an upcoming appointment? Yes  Can we respond through MyChart? Yes  Agent: Please be advised that Rx refills may take up to 3 business days. We ask that you follow-up with your pharmacy.

## 2024-01-23 ENCOUNTER — Telehealth: Payer: Self-pay | Admitting: Physical Therapy

## 2024-01-23 NOTE — Telephone Encounter (Signed)
 Returned patient's phone call regarding compression garment. Left a message. Also let pt know that she can send a mychart message if that is easier than a phone call.  Smriti Barkow Breedlove Blue, Prentice 01/23/24 4:24 PM

## 2024-03-27 ENCOUNTER — Encounter: Payer: Self-pay | Admitting: Physician Assistant

## 2024-03-27 ENCOUNTER — Ambulatory Visit (INDEPENDENT_AMBULATORY_CARE_PROVIDER_SITE_OTHER): Admitting: Physician Assistant

## 2024-03-27 VITALS — BP 126/88 | HR 83 | Temp 97.5°F | Ht 65.5 in | Wt 115.6 lb

## 2024-03-27 DIAGNOSIS — E038 Other specified hypothyroidism: Secondary | ICD-10-CM

## 2024-03-27 DIAGNOSIS — J432 Centrilobular emphysema: Secondary | ICD-10-CM

## 2024-03-27 DIAGNOSIS — I700 Atherosclerosis of aorta: Secondary | ICD-10-CM

## 2024-03-27 DIAGNOSIS — E559 Vitamin D deficiency, unspecified: Secondary | ICD-10-CM | POA: Diagnosis not present

## 2024-03-27 DIAGNOSIS — R634 Abnormal weight loss: Secondary | ICD-10-CM | POA: Diagnosis not present

## 2024-03-27 DIAGNOSIS — Z23 Encounter for immunization: Secondary | ICD-10-CM | POA: Diagnosis not present

## 2024-03-27 DIAGNOSIS — E782 Mixed hyperlipidemia: Secondary | ICD-10-CM

## 2024-03-27 DIAGNOSIS — I10 Essential (primary) hypertension: Secondary | ICD-10-CM | POA: Diagnosis not present

## 2024-03-27 DIAGNOSIS — D0000 Carcinoma in situ of oral cavity, unspecified site: Secondary | ICD-10-CM | POA: Diagnosis not present

## 2024-03-27 DIAGNOSIS — R1313 Dysphagia, pharyngeal phase: Secondary | ICD-10-CM | POA: Diagnosis not present

## 2024-03-27 DIAGNOSIS — Z122 Encounter for screening for malignant neoplasm of respiratory organs: Secondary | ICD-10-CM | POA: Diagnosis not present

## 2024-03-27 NOTE — Progress Notes (Signed)
 Subjective:  Patient ID: Tammy Brady, female    DOB: 12/13/1950  Age: 73 y.o. MRN: [id number]  Chief Complaint  Patient presents with   Medical Management of Chronic Issues    Hyperlipidemia  Hypertension    Pt presents for follow up of hypertension.   - currently on diltiazem  240mg  - bp at home has been doing well on this dose - bp today in office 126/88 Denies chest pain/dyspnea/edema  Pt with history of hypothyroidism - currently on synthroid  88mcg qd - due for labwork Currently having no symptoms  Pt with history of aortic atherosclerosis - currently on zocor  40mg  qd and taking asa 81mg  qd -   Mixed hyperlipidemia  Pt presents with hyperlipidemia.  Compliance with treatment has been good -The patient is compliant with medications, maintains a low cholesterol diet , follows up as directed , and maintains an exercise regimen . The patient denies experiencing any hypercholesterolemia related symptoms. She is currently taking zocor  40mg  qd  Pt with history of emphysema.  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  She is still following with Head/Neck specialists in [facility].  She has appt with them next month - she follows with oncologist yearly  Pt would like flu shot today  It is noted that pt has lost about 8 pounds since last visit.  She states her appetite is good and tries to eat soft foods, protein drinks, etc but hard to eat a lot of foods due to prior surgery and radiation on mouth/neck.  She has noted that she has been having trouble swallowing and food/pills get stuck She is agreeable for GI referral to further evaluate  Current Outpatient Medications on File Prior to Visit  Medication Sig Dispense Refill   acetaminophen  (TYLENOL ) 500 MG tablet Take 500 mg by mouth every 6 (six) hours as needed for mild pain.     albuterol  (VENTOLIN   HFA) 108 (90 Base) MCG/ACT inhaler Inhale 2 puffs into the lungs every 6 (six) hours as needed for wheezing or shortness of breath. 8 g 5   aspirin  EC 81 MG tablet Take 1 tablet (81 mg total) by mouth daily. Swallow whole. 90 tablet 3   calcium  carbonate (OS-CAL) 600 MG TABS Take 600 mg by mouth daily.     diltiazem  (CARDIZEM  CD) 240 MG 24 hr capsule Take 1 capsule (240 mg total) by mouth daily. 100 capsule 1   Fluticasone -Umeclidin-Vilant (TRELEGY ELLIPTA ) 100-62.5-25 MCG/ACT AEPB Take 1 Inhalation by mouth daily. 3 each 3   levothyroxine  (SYNTHROID ) 88 MCG tablet TAKE 1 TABLET BY MOUTH DAILY  BEFORE BREAKFAST 100 tablet 2   nitroGLYCERIN  (NITROSTAT ) 0.4 MG SL tablet Place 1 tablet (0.4 mg total) under the tongue every 5 (five) minutes as needed. 25 tablet 6   simvastatin  (ZOCOR ) 40 MG tablet TAKE 1 TABLET BY MOUTH AT  BEDTIME 100 tablet 1   No current facility-administered medications on file prior to visit.   Past Medical History:  Diagnosis Date   Aortic atherosclerosis (HCC)    CAD (coronary artery disease)    Mild non-obstructive disease by cath January 2011. Coronary CTA showed  mild (25-49%) plaque in the RCA and minimal (<25%) coronary CTA 04/2021 with coronary Ca score 257.  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 chemo   Past Surgical History:  Procedure Laterality Date   APPENDECTOMY     EXCISION ORAL TUMOR  01/18/2021   see details [facility] Everywhere   IR IMAGING GUIDED PORT INSERTION  03/02/2021   IR REMOVAL TUN ACCESS W/ PORT W/O FL MOD SED  08/23/2022    Family History  Problem Relation Age of Onset   Cancer Mother        esophageal   Heart disease  Father    Coronary artery disease Father    Hyperlipidemia Sister    Hyperlipidemia Sister    Hyperlipidemia Brother    Hyperlipidemia Brother    Hyperlipidemia Brother    Coronary artery disease Maternal Grandmother    Social History   Socioeconomic History   Marital status: Married    Spouse name: [name]   Number of children: 1   Years of education: Not on file   Highest education level: 12th grade  Occupational History   Occupation: Geophysical data processor: [employer]  Tobacco Use   Smoking status: Former    Current packs/day: 0.00    Average packs/day: 1 pack/day for 40.0 years (40.0 ttl pk-yrs)    Types: Cigarettes    Start date: 05/11/1969    Quit date: 05/11/2009    Years since quitting: 14.8   Smokeless tobacco: Never  Vaping Use   Vaping status: Never Used  Substance and Sexual Activity   Alcohol use: Not Currently   Drug use: No   Sexual activity: Not Currently  Other Topics Concern   Not on file  Social History Narrative   Not on file   Social Drivers of Health   Financial Resource Strain: Low Risk  (03/23/2024)   Overall Financial Resource Strain (CARDIA)    Difficulty of Paying Living Expenses: Not hard at all  Food Insecurity: No Food Insecurity (03/23/2024)   Hunger Vital Sign    Worried About Running Out of Food in the Last Year: Never true    Ran Out of Food in the Last Year: Never true  Transportation Needs: No Transportation Needs (03/23/2024)   PRAPARE - Administrator, Civil Service (Medical): No    Lack of Transportation (Non-Medical): No  Physical Activity: Sufficiently Active (03/23/2024)   Exercise Vital Sign    Days of Exercise per Week: 4 days    Minutes of Exercise per Session: 40 min  Stress: No Stress Concern Present (03/23/2024)   [employer] of Occupational Health - Occupational Stress Questionnaire    Feeling of Stress: Only a little  Social Connections: Socially Integrated (03/23/2024)   Social Connection and  Isolation Panel    Frequency of Communication with Friends and Family: Once a week    Frequency of Social Gatherings with Friends and Family: More than three times a week    Attends Religious Services: More than 4 times per year    Active Member of [employer] or Organizations: Yes    Attends Engineer, structural: More than 4 times per year    Marital Status: Married       No visits with results within 1 Day(s) from this visit.  Latest  known visit with results is:  Abstract on 10/19/2023  Component Date Value Ref Range Status   HM Dexa Scan 10/19/2023 NORMAL   Final   See Media   CONSTITUTIONAL: see HPI E/N/T: Negative for ear pain, nasal congestion and sore throat.  CARDIOVASCULAR: Negative for chest pain, dizziness, palpitations and pedal edema.  RESPIRATORY: Negative for recent cough and dyspnea.  GASTROINTESTINAL: see HPI MSK: Negative for arthralgias and myalgias.  INTEGUMENTARY: Negative for rash.  NEUROLOGICAL: Negative for dizziness and headaches.  PSYCHIATRIC: Negative for sleep disturbance and to question depression screen.  Negative for depression, negative for anhedonia.       Objective:  PHYSICAL EXAM:   VS: BP 126/88   Pulse 83   Temp (!) 97.5 F (36.4 C)   Ht 5' 5.5 (1.664 m)   Wt 115 lb 9.6 oz (52.4 kg)   BMI 18.94 kg/m   GEN: Well nourished, well developed, in no acute distress  HEENT: normal external ears and nose - normal external auditory canals and TMS - - Lips, Teeth and Gums - normal  Oropharynx - noted surgical resection of left jaw noted Cardiac: RRR; no murmurs, rubs, or gallops,no edema -  Respiratory:  normal respiratory rate and pattern with no distress - normal breath sounds with no rales, rhonchi, wheezes or rubs  Skin: warm and dry, no rash  Neuro:  Alert and Oriented x 3,  - CN II-Xii grossly intact Psych: euthymic mood, appropriate affect and demeanor   No visits with results within 1 Day(s) from this visit.  Latest known  visit with results is:  Abstract on 10/19/2023  Component Date Value Ref Range Status   HM Dexa Scan 10/19/2023 NORMAL   Final   See Media    Lab Results  Component Value Date   WBC 6.2 09/25/2023   HGB 13.1 09/25/2023   HCT 40.4 09/25/2023   PLT 223 09/25/2023   GLUCOSE 74 09/25/2023   CHOL 159 09/25/2023   TRIG 61 09/25/2023   HDL 84 09/25/2023   LDLCALC 63 09/25/2023   ALT 14 09/25/2023   AST 26 09/25/2023   NA 142 09/25/2023   K 5.3 (H) 09/25/2023   CL 105 09/25/2023   CREATININE 0.75 09/25/2023   BUN 15 09/25/2023   CO2 24 09/25/2023   TSH 1.870 09/25/2023   INR 0.99 07/16/2009   HGBA1C  07/15/2009    5.1 (NOTE) The ADA recommends the following therapeutic goal for glycemic control related to Hgb A1c measurement: Goal of therapy: <6.5 Hgb A1c  Reference: American Diabetes Association: Clinical Practice Recommendations 2010, Diabetes Care, 2010, 33: (Suppl  1).      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  No orders of the defined types were placed in this encounter.    Orders Placed This Encounter  Procedures   CT CHEST LUNG CA SCREEN LOW DOSE  W/O CM   Flu vaccine HIGH DOSE PF(Fluzone Trivalent)   CBC with Differential/Platelet   Comprehensive metabolic panel with GFR   TSH   Lipid panel   VITAMIN D  25 Hydroxy (Vit-D Deficiency, Fractures)   Ambulatory referral to Gastroenterology     Follow-up: Return in about 6 months (around 09/24/2024) for chronic fasting follow-up.  An After Visit Summary was printed and given to the patient.  CAMIE JONELLE NICHOLAUS DEVONNA [facility] [phone]

## 2024-03-27 NOTE — Patient Instructions (Signed)
 If I have ordered a referral, lab work, or a test, please watch for messages/letters in your [facility]. Please be aware of unknown numbers, as this may be a specialist's office attempting to call and schedule your appointment. You may wish to enter the specialist's phone number in your contacts, so your phone will not block the calls as SPAM. If you have NOT been contacted with in 2 weeks: Please call the specialist's office  Call [facility].

## 2024-03-28 ENCOUNTER — Ambulatory Visit: Payer: Self-pay | Admitting: Physician Assistant

## 2024-03-28 LAB — CBC WITH DIFFERENTIAL/PLATELET
Basophils Absolute: 0 x10E3/uL (ref 0.0–0.2)
Basos: 1 %
EOS (ABSOLUTE): 0.1 x10E3/uL (ref 0.0–0.4)
Eos: 2 %
Hematocrit: 41.5 % (ref 34.0–46.6)
Hemoglobin: 13.3 g/dL (ref 11.1–15.9)
Immature Grans (Abs): 0 x10E3/uL (ref 0.0–0.1)
Immature Granulocytes: 0 %
Lymphocytes Absolute: 1 x10E3/uL (ref 0.7–3.1)
Lymphs: 18 %
MCH: 31.4 pg (ref 26.6–33.0)
MCHC: 32 g/dL (ref 31.5–35.7)
MCV: 98 fL — ABNORMAL HIGH (ref 79–97)
Monocytes Absolute: 0.5 x10E3/uL (ref 0.1–0.9)
Monocytes: 9 %
Neutrophils Absolute: 3.9 x10E3/uL (ref 1.4–7.0)
Neutrophils: 70 %
Platelets: 226 x10E3/uL (ref 150–450)
RBC: 4.23 x10E6/uL (ref 3.77–5.28)
RDW: 12 % (ref 11.7–15.4)
WBC: 5.4 x10E3/uL (ref 3.4–10.8)

## 2024-03-28 LAB — COMPREHENSIVE METABOLIC PANEL WITH GFR
ALT: 13 IU/L (ref 0–32)
AST: 25 IU/L (ref 0–40)
Albumin: 4.4 g/dL (ref 3.8–4.8)
Alkaline Phosphatase: 60 IU/L (ref 49–135)
BUN/Creatinine Ratio: 23 (ref 12–28)
BUN: 19 mg/dL (ref 8–27)
Bilirubin Total: 0.3 mg/dL (ref 0.0–1.2)
CO2: 23 mmol/L (ref 20–29)
Calcium: 9.7 mg/dL (ref 8.7–10.3)
Chloride: 103 mmol/L (ref 96–106)
Creatinine, Ser: 0.82 mg/dL (ref 0.57–1.00)
Globulin, Total: 2.2 g/dL (ref 1.5–4.5)
Glucose: 81 mg/dL (ref 70–99)
Potassium: 5.5 mmol/L — ABNORMAL HIGH (ref 3.5–5.2)
Sodium: 140 mmol/L (ref 134–144)
Total Protein: 6.6 g/dL (ref 6.0–8.5)
eGFR: 76 mL/min/1.73 (ref >59)

## 2024-03-28 LAB — LIPID PANEL
Chol/HDL Ratio: 1.9 ratio (ref 0.0–4.4)
Cholesterol, Total: 155 mg/dL (ref 100–199)
HDL: 81 mg/dL (ref >39)
LDL Chol Calc (NIH): 61 mg/dL (ref 0–99)
Triglycerides: 64 mg/dL (ref 0–149)
VLDL Cholesterol Cal: 13 mg/dL (ref 5–40)

## 2024-03-28 LAB — VITAMIN D 25 HYDROXY (VIT D DEFICIENCY, FRACTURES): Vit D, 25-Hydroxy: 62.1 ng/mL (ref 30.0–100.0)

## 2024-03-28 LAB — TSH: TSH: 1.06 u[IU]/mL (ref 0.450–4.500)

## 2024-04-04 DIAGNOSIS — H35033 Hypertensive retinopathy, bilateral: Secondary | ICD-10-CM | POA: Diagnosis not present

## 2024-04-04 DIAGNOSIS — H43822 Vitreomacular adhesion, left eye: Secondary | ICD-10-CM | POA: Diagnosis not present

## 2024-04-04 DIAGNOSIS — H25813 Combined forms of age-related cataract, bilateral: Secondary | ICD-10-CM | POA: Diagnosis not present

## 2024-04-04 DIAGNOSIS — H35371 Puckering of macula, right eye: Secondary | ICD-10-CM | POA: Diagnosis not present

## 2024-04-07 ENCOUNTER — Encounter: Payer: Self-pay | Admitting: Hematology and Oncology

## 2024-04-09 ENCOUNTER — Ambulatory Visit (HOSPITAL_BASED_OUTPATIENT_CLINIC_OR_DEPARTMENT_OTHER): Admitting: Radiology

## 2024-04-10 ENCOUNTER — Encounter: Payer: Self-pay | Admitting: Physician Assistant

## 2024-04-10 ENCOUNTER — Ambulatory Visit (INDEPENDENT_AMBULATORY_CARE_PROVIDER_SITE_OTHER)
Admission: RE | Admit: 2024-04-10 | Discharge: 2024-04-10 | Disposition: A | Discharge status: Home / Self Care | Source: Ambulatory Visit | Attending: Physician Assistant | Admitting: Physician Assistant

## 2024-04-10 ENCOUNTER — Ambulatory Visit (HOSPITAL_BASED_OUTPATIENT_CLINIC_OR_DEPARTMENT_OTHER): Admitting: Radiology

## 2024-04-10 DIAGNOSIS — Z122 Encounter for screening for malignant neoplasm of respiratory organs: Secondary | ICD-10-CM

## 2024-04-10 DIAGNOSIS — Z87891 Personal history of nicotine dependence: Secondary | ICD-10-CM

## 2024-04-11 ENCOUNTER — Encounter: Payer: Self-pay | Admitting: Physician Assistant

## 2024-04-15 ENCOUNTER — Telehealth: Payer: Self-pay

## 2024-04-15 ENCOUNTER — Other Ambulatory Visit: Payer: Self-pay | Admitting: Physician Assistant

## 2024-04-15 DIAGNOSIS — D0000 Carcinoma in situ of oral cavity, unspecified site: Secondary | ICD-10-CM

## 2024-04-15 DIAGNOSIS — R911 Solitary pulmonary nodule: Secondary | ICD-10-CM

## 2024-04-15 NOTE — Telephone Encounter (Signed)
 Spoke with Gordy from [facility] and made him aware provider has spoke to patient about results from CT  Copied from CRM [id number]. Topic: Clinical - Lab/Test Results >> Apr 15, 2024  3:34 PM Sophia H wrote: Reason for CRM: Cheryll with [facility] is calling to give a report on the patient - Called [facility] but no one available. Please reach out # [phone]

## 2024-04-22 ENCOUNTER — Ambulatory Visit

## 2024-04-29 ENCOUNTER — Ambulatory Visit
Admission: RE | Admit: 2024-04-29 | Discharge: 2024-04-29 | Disposition: A | Discharge status: Home / Self Care | Source: Ambulatory Visit | Attending: Physician Assistant | Admitting: Physician Assistant

## 2024-04-29 ENCOUNTER — Ambulatory Visit: Payer: Self-pay | Admitting: Physician Assistant

## 2024-04-29 DIAGNOSIS — I700 Atherosclerosis of aorta: Secondary | ICD-10-CM | POA: Diagnosis not present

## 2024-04-29 DIAGNOSIS — R918 Other nonspecific abnormal finding of lung field: Secondary | ICD-10-CM | POA: Diagnosis not present

## 2024-04-29 DIAGNOSIS — R911 Solitary pulmonary nodule: Secondary | ICD-10-CM | POA: Insufficient documentation

## 2024-04-29 DIAGNOSIS — D0000 Carcinoma in situ of oral cavity, unspecified site: Secondary | ICD-10-CM | POA: Insufficient documentation

## 2024-04-29 DIAGNOSIS — N3289 Other specified disorders of bladder: Secondary | ICD-10-CM

## 2024-04-29 DIAGNOSIS — I2510 Atherosclerotic heart disease of native coronary artery without angina pectoris: Secondary | ICD-10-CM | POA: Diagnosis not present

## 2024-04-29 DIAGNOSIS — R942 Abnormal results of pulmonary function studies: Secondary | ICD-10-CM

## 2024-04-29 LAB — GLUCOSE, CAPILLARY: Glucose-Capillary: 83 mg/dL (ref 70–99)

## 2024-04-29 MED ORDER — FLUDEOXYGLUCOSE F - 18 (FDG) INJECTION
6.1600 | Freq: Once | INTRAVENOUS | Status: AC | PRN
  Administered 2024-04-29: 6.16 via INTRAVENOUS

## 2024-05-02 NOTE — Progress Notes (Signed)
 Tammy Console, PA-C [address] [place], [place]  [address] Phone: [phone]   Gastroenterology Consultation  Referring Provider:     Nicholaus Credit, PA-C Primary Care Physician:  Tammy Credit, PA-C Primary Gastroenterologist:  Tammy Console, PA-C / Glendia Holt, MD  Reason for Consultation:     Pharyngeal dysphagia and weight loss        HPI:   73 year old female is referred by her PCP to evaluate difficulty swallowing and weight loss.  She is here today with her husband.  New patient.  Here to evaluate pharyngeal dysphagia and weight loss.  She has had pharyngeal dysphagia since 2022 when she was treated with mouth cancer s/p surgery, chemo, and radiation.  Her mother had esophageal cancer.  She is followed by ENT at [facility].  She has been having difficulty swallowing liquids, food and pills.  She has coughing episodes when drinking liquids and feels like it is going in her lungs.  She feels like solid foods and large pills will not go through her throat and neck.  She avoids eating solid foods.  She can only eat very soft foods such as yogurt, protein shakes, or milkshakes.  She is losing weight because she is not able to eat or swallow well.  She had several manic lymph nodes removed and scar tissue in her neck.  She has not had a barium swallow test or an EGD.  No previous GI evaluation for the symptoms.  She is scheduled to have CT abdomen, and pelvis with and without contrast on 05/06/2024.  Unfortunately, she was recently diagnosed with a suspicious lung nodule.  She has upcoming appointments with pulmonology (Dr. Kara) and oncology (Dr. Loretha).  04/29/2024 PET scan: 1. Hypermetabolic right upper lobe nodule is most likely due to stage IA primary bronchogenic carcinoma. Difficult to definitively exclude a metastasis in this patient with a history oropharyngeal cancer. 2. Focal hypermetabolism in the right hilum. A metastatic lymph node cannot be  excluded. 3. Mildly hypermetabolic clustered nodularity in the right lower lobe, likely infectious in etiology. Recommend attention on follow-up. 4. Possible ventral bladder wall thickening. Please correlate clinically and consider CT abdomen pelvis without and with contrast, as clinically indicated. 5. Aortic atherosclerosis (ICD10-I70.0). Coronary artery calcification.  04/10/2024 chest CT: 1. Lung-RADS 4A, suspicious. Follow up low-dose chest CT without contrast in 3 months (please use the following order, CT CHEST LCS NODULE FOLLOW-UP W/O CM) is recommended. Alternatively, PET may be considered when there is a solid component 8 mm or larger. New spiculated solid peripheral right upper lobe pulmonary nodule measuring 10.3 mm in volume derived mean diameter, suspicious for primary bronchogenic carcinoma. 2. Two-vessel coronary atherosclerosis. 3. Aortic Atherosclerosis (ICD10-I70.0) and Emphysema (ICD10-J43.9).  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  Prior appendectomy.  Takes 81 mg aspirin  daily.  No other blood thinners.  Past Medical History:  Diagnosis Date   Aortic atherosclerosis    CAD (coronary artery disease)    Mild non-obstructive disease by cath January 2011. Coronary CTA showed  mild (25-49%) plaque in the RCA and minimal (<25%) coronary CTA 04/2021 with coronary Ca score 257.  Cor Ca score 448 with 25-49% RCA and <25% pLCx and pLAD by coronary CTA 03/2022   Carotid artery disease    1 to 39% right ICA stenosis and 40 to 59% left  ICA stenosis by Dopplers 02/2022   GERD (gastroesophageal reflux disease)    Hyperlipidemia    Hypertension    Hypothyroidism    Squamous cell carcinoma of mouth (HCC)    s/p resection and XRT and chemo    Past Surgical History:  Procedure Laterality Date    APPENDECTOMY     EXCISION ORAL TUMOR  01/18/2021   see details [facility] Everywhere   IR IMAGING GUIDED PORT INSERTION  03/02/2021   IR REMOVAL TUN ACCESS W/ PORT W/O FL MOD SED  08/23/2022    Prior to Admission medications   Medication Sig Start Date End Date Taking? Authorizing Provider  acetaminophen  (TYLENOL ) 500 MG tablet Take 500 mg by mouth every 6 (six) hours as needed for mild pain.    [provider]  albuterol  (VENTOLIN  HFA) 108 (90 Base) MCG/ACT inhaler Inhale 2 puffs into the lungs every 6 (six) hours as needed for wheezing or shortness of breath. 03/21/23   Tammy Credit, PA-C  aspirin  EC 81 MG tablet Take 1 tablet (81 mg total) by mouth daily. Swallow whole. 04/26/21   Shlomo Wilbert SAUNDERS, MD  calcium  carbonate (OS-CAL) 600 MG TABS Take 600 mg by mouth daily.    [provider]  diltiazem  (CARDIZEM  CD) 240 MG 24 hr capsule Take 1 capsule (240 mg total) by mouth daily. 01/01/24   Sirivol, Mamatha, MD  Fluticasone -Umeclidin-Vilant (TRELEGY ELLIPTA ) 100-62.5-25 MCG/ACT AEPB Take 1 Inhalation by mouth daily. 09/25/23   Tammy Credit, PA-C  levothyroxine  (SYNTHROID ) 88 MCG tablet TAKE 1 TABLET BY MOUTH DAILY  BEFORE BREAKFAST 06/05/23   Tammy Credit, PA-C  nitroGLYCERIN  (NITROSTAT ) 0.4 MG SL tablet Place 1 tablet (0.4 mg total) under the tongue every 5 (five) minutes as needed. 01/24/22   Tammy Credit, PA-C  simvastatin  (ZOCOR ) 40 MG tablet TAKE 1 TABLET BY MOUTH AT  BEDTIME 07/17/23   Tammy Credit, PA-C    Family History  Problem Relation Age of Onset   Cancer Mother        esophageal   Heart disease Father    Coronary artery disease Father    Hyperlipidemia Sister    Hyperlipidemia Sister    Hyperlipidemia Brother    Hyperlipidemia Brother    Hyperlipidemia Brother    Coronary artery disease Maternal Grandmother      Social History   Tobacco Use   Smoking status: Former    Current packs/day: 0.00    Average packs/day: 1 pack/day for 40.0 years (40.0 ttl pk-yrs)     Types: Cigarettes    Start date: 05/11/1969    Quit date: 05/11/2009    Years since quitting: 14.9   Smokeless tobacco: Never  Vaping Use   Vaping status: Never Used  Substance Use Topics   Alcohol use: Not Currently   Drug use: No    Allergies as of 05/03/2024 - Review Complete 05/03/2024  Allergen Reaction Noted   Codeine Other (See Comments) 08/11/2015   Oxycodone   05/25/2021    Review of Systems:    All systems reviewed and negative except where noted in HPI.   Physical Exam:  BP 110/64   Pulse 61   Ht 5' 5 (1.651 m)   Wt 114 lb 2 oz (51.8 kg)   BMI 18.99 kg/m  No LMP recorded. Patient is postmenopausal.  General:   Alert,  Well-developed, thin, pleasant and cooperative in NAD; walks with normal gait.  No assistive devices.  Able to get on and off exam table with no  help. Mouth: Scar tissue and deformity of the left lower jaw. Neck: No enlarged lymph nodes or neck masses.  Moderate scar tissue. Lungs:  Respirations even and unlabored.  Clear throughout to auscultation.   No wheezes, crackles, or rhonchi. No acute distress. Heart:  Regular rate and rhythm; no murmurs, clicks, rubs, or gallops. Abdomen:  Normal bowel sounds.  No bruits.  Soft, and non-distended without masses, hepatosplenomegaly or hernias noted.  No Tenderness.  No guarding or rebound tenderness.    Neurologic:  Alert and oriented x3;  grossly normal neurologically. Psych:  Alert and cooperative. Normal mood and affect.   Imaging Studies: NM PET Image Initial (PI) Skull Base To Thigh (F-18 FDG) Result Date: 04/29/2024 CLINICAL DATA:  Initial treatment strategy for lung nodule, oral cavity squamous cell carcinoma. EXAM: NUCLEAR MEDICINE PET SKULL BASE TO THIGH TECHNIQUE: 6.2 mCi F-18 FDG was injected intravenously. Full-ring PET imaging was performed from the skull base to thigh after the radiotracer. CT data was obtained and used for attenuation correction and anatomic localization. Fasting blood glucose:  83 mg/dl COMPARISON:  CT chest 89/98/7974. FINDINGS: Mediastinal blood pool activity: SUV max 1.9 Liver activity: SUV max NA NECK: No hypermetabolic lymph nodes. Incidental CT findings: None. CHEST: Hypermetabolic lateral right upper lobe nodule measures 8 mm (6/46), SUV max 2.6. Focal right hilar hypermetabolism, SUV max 2.5. Correlation with CT is challenging without IV contrast. Clustered nodularity in the posterolateral right lower lobe (6/79), SUV max 2.8. No additional abnormal hypermetabolism. Incidental CT findings: Atherosclerotic calcification of the aorta, aortic valve and coronary arteries. Heart is at the upper limits of normal in size to mildly enlarged. No pericardial or pleural effusion. Centrilobular emphysema. ABDOMEN/PELVIS: No abnormal hypermetabolism. Incidental CT findings: There may be ventral bladder wall thickening (6/137). SKELETON: No abnormal hypermetabolism. Incidental CT findings: Degenerative changes in the spine and hips. IMPRESSION: 1. Hypermetabolic right upper lobe nodule is most likely due to stage IA primary bronchogenic carcinoma. Difficult to definitively exclude a metastasis in this patient with a history oropharyngeal cancer. 2. Focal hypermetabolism in the right hilum. A metastatic lymph node cannot be excluded. 3. Mildly hypermetabolic clustered nodularity in the right lower lobe, likely infectious in etiology. Recommend attention on follow-up. 4. Possible ventral bladder wall thickening. Please correlate clinically and consider CT abdomen pelvis without and with contrast, as clinically indicated. 5. Aortic atherosclerosis (ICD10-I70.0). Coronary artery calcification. Electronically Signed   By: Newell Eke M.D.   On: 04/29/2024 11:51   CT CHEST LUNG CA SCREEN LOW DOSE W/O CM Result Date: 04/12/2024 CLINICAL DATA:  73 year old female former smoker with 40 pack-year smoking history, quit smoking 3.5 years prior EXAM: CT CHEST WITHOUT CONTRAST LOW-DOSE FOR LUNG CANCER  SCREENING TECHNIQUE: Multidetector CT imaging of the chest was performed following the standard protocol without IV contrast. RADIATION DOSE REDUCTION: This exam was performed according to the departmental dose-optimization program which includes automated exposure control, adjustment of the mA and/or kV according to patient size and/or use of iterative reconstruction technique. COMPARISON:  03/27/2023 screening chest CT FINDINGS: Cardiovascular: Normal heart size. No significant pericardial effusion/thickening. Left anterior descending and right coronary atherosclerosis. Atherosclerotic nonaneurysmal thoracic aorta. Normal caliber pulmonary arteries. Mediastinum/Nodes: No significant thyroid  nodules. Unremarkable esophagus. No pathologically enlarged axillary, mediastinal or hilar lymph nodes, noting limited sensitivity for the detection of hilar adenopathy on this noncontrast study. Lungs/Pleura: No pneumothorax. No pleural effusion. Severe centrilobular emphysema with diffuse bronchial wall thickening. No acute consolidative airspace disease or lung masses. Spiculated solid peripheral  right upper lobe pulmonary nodule measuring 10.3 mm in volume derived mean diameter on image 66 is new. Tiny solid right lower lobe pulmonary nodules up to 3.6 mm posteriorly on image 143 are new. Upper abdomen: No acute abnormality. Musculoskeletal: No aggressive appearing focal osseous lesions. Mild thoracic spondylosis. IMPRESSION: 1. Lung-RADS 4A, suspicious. Follow up low-dose chest CT without contrast in 3 months (please use the following order, CT CHEST LCS NODULE FOLLOW-UP W/O CM) is recommended. Alternatively, PET may be considered when there is a solid component 8 mm or larger. New spiculated solid peripheral right upper lobe pulmonary nodule measuring 10.3 mm in volume derived mean diameter, suspicious for primary bronchogenic carcinoma. 2. Two-vessel coronary atherosclerosis. 3. Aortic Atherosclerosis (ICD10-I70.0) and  Emphysema (ICD10-J43.9). These results will be called to the ordering clinician or representative by the Radiologist Assistant, and communication documented in the PACS or [employer]. Electronically Signed   By: Selinda DELENA Blue M.D.   On: 04/12/2024 22:05    Labs: CBC    Component Value Date/Time   WBC 5.4 03/27/2024 0907   WBC 5.6 07/19/2021 0848   WBC 4.7 05/18/2021 0516   RBC 4.23 03/27/2024 0907   RBC 3.79 (L) 07/19/2021 0848   HGB 13.3 03/27/2024 0907   HCT 41.5 03/27/2024 0907   PLT 226 03/27/2024 0907   MCV 98 (H) 03/27/2024 0907    CMP     Component Value Date/Time   NA 140 03/27/2024 0907   K 5.5 (H) 03/27/2024 0907   CL 103 03/27/2024 0907   CO2 23 03/27/2024 0907   GLUCOSE 81 03/27/2024 0907   GLUCOSE 98 07/19/2021 0848   BUN 19 03/27/2024 0907   CREATININE 0.82 03/27/2024 0907   CREATININE 0.84 07/19/2021 0848   CALCIUM  9.7 03/27/2024 0907   PROT 6.6 03/27/2024 0907   ALBUMIN 4.4 03/27/2024 0907   AST 25 03/27/2024 0907   AST 19 07/19/2021 0848   ALT 13 03/27/2024 0907   ALT 12 07/19/2021 0848   ALKPHOS 60 03/27/2024 0907   BILITOT 0.3 03/27/2024 0907   BILITOT 0.3 07/19/2021 0848   GFRNONAA >60 07/19/2021 0848   GFRAA 83 08/26/2020 0927    Assessment and Plan:   Tammy Brady is a 73 y.o. y/o female has been referred for:   1.  Worsening pharyngeal dysphagia since 2022:  History of mouth and neck surgery, chemo, and radiation to treat mouth cancer in 2022.  Aspirating liquids.  She may have a complicated upper esophageal stricture from neck radiation.   - Start with Modifed Ba Swallow.   - Pending results, then schedule Regular Ba Swallow with tablet.   - Pending results, then decide about scheduling EGD with Dilation (in hospital vs [facility]).  2.  Unintentional weight loss: Multifactorial.  She is not eating solid foods because she cannot chew or swallow well. - Encouraged her to eat pured diet and thick liquids. - Encouraged her to continue  drinking protein shakes, Boost, Ensure, and increasing calories in diet. - Continue with plan for upcoming CT abdomen and pelvis with contrast.  3..  Squamous cell carcinoma of the mouth s/p resection, chemo, and radiation in 2022 followed at [facility].  4.  Abnormal PET scan and lung nodule possible bronchogenic carcinoma - She is scheduled to have CT abdomen, and pelvis with and without contrast on 05/06/2024. - She has follow-up appointments with pulmonology and oncology in the near future.  5.  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  Prior appendectomy.  Takes 81 mg aspirin  daily.  No other blood thinners.  6.  Family history of esophageal cancer: Mother   Follow up based on test results and symptoms.  Tammy Console, PA-C

## 2024-05-03 ENCOUNTER — Encounter: Payer: Self-pay | Admitting: Physician Assistant

## 2024-05-03 ENCOUNTER — Ambulatory Visit: Admitting: Physician Assistant

## 2024-05-03 ENCOUNTER — Other Ambulatory Visit (HOSPITAL_COMMUNITY): Payer: Self-pay | Admitting: Physician Assistant

## 2024-05-03 VITALS — BP 110/64 | HR 61 | Ht 65.0 in | Wt 114.1 lb

## 2024-05-03 DIAGNOSIS — R634 Abnormal weight loss: Secondary | ICD-10-CM

## 2024-05-03 DIAGNOSIS — R911 Solitary pulmonary nodule: Secondary | ICD-10-CM

## 2024-05-03 DIAGNOSIS — R059 Cough, unspecified: Secondary | ICD-10-CM

## 2024-05-03 DIAGNOSIS — R1313 Dysphagia, pharyngeal phase: Secondary | ICD-10-CM | POA: Diagnosis not present

## 2024-05-03 DIAGNOSIS — Z800 Family history of malignant neoplasm of digestive organs: Secondary | ICD-10-CM

## 2024-05-03 DIAGNOSIS — T17998A Other foreign object in respiratory tract, part unspecified causing other injury, initial encounter: Secondary | ICD-10-CM

## 2024-05-03 DIAGNOSIS — R1310 Dysphagia, unspecified: Secondary | ICD-10-CM

## 2024-05-03 DIAGNOSIS — C069 Malignant neoplasm of mouth, unspecified: Secondary | ICD-10-CM | POA: Diagnosis not present

## 2024-05-03 NOTE — Patient Instructions (Signed)
 You have been scheduled for a modified barium swallow on 05/23/24 at 1:00 pm. Please arrive 30 minutes prior to your test for registration. You will go to the Main Entrance and ask for Radiology (1st Floor) for your appointment. Should you need to cancel or reschedule your appointment, please contact [phone] Geroge Long). _____________________________________________________________________ A Modified Barium Swallow Study, or MBS, is a special x-ray that is taken to check swallowing skills. It is carried out by a Marine scientist and a Warehouse manager (SLP). During this test, yourmouth, throat, and esophagus, a muscular tube which connects your mouth to your stomach, is checked. The test will help you, your doctor, and the SLP plan what types of foods and liquids are easier for you to swallow. The SLP will also identify positions and ways to help you swallow more easily and safely. What will happen during an MBS? You will be taken to an x-ray room and seated comfortably. You will be asked to swallow small amounts of food and liquid mixed with barium. Barium is a liquid or paste that allows images of your mouth, throat and esophagus to be seen on x-ray. The x-ray captures moving images of the food you are swallowing as it travels from your mouth through your throat and into your esophagus. This test helps identify whether food or liquid is entering your lungs (aspiration). The test also shows which part of your mouth or throat lacks strength or coordination to move the food or liquid in the right direction. This test typically takes 30 minutes to 1 hour to complete. _______________________________________________________________________  Please follow up sooner if symptoms increase or worsen  Due to recent changes in healthcare laws, you may see the results of your imaging and laboratory studies on MyChart before your provider has had a chance to review them.  We understand that in some cases  there may be results that are confusing or concerning to you. Not all laboratory results come back in the same time frame and the provider may be waiting for multiple results in order to interpret others.  Please give us  48 hours in order for your provider to thoroughly review all the results before contacting the office for clarification of your results.   Thank you for trusting me with your gastrointestinal care!   Ellouise Console, PA-C _______________________________________________________  If your blood pressure at your visit was 140/90 or greater, please contact your primary care physician to follow up on this.  _______________________________________________________  If you are age 97 or older, your body mass index should be between 23-30. Your Body mass index is 18.99 kg/m. If this is out of the aforementioned range listed, please consider follow up with your Primary Care Provider.  If you are age 75 or younger, your body mass index should be between 19-25. Your Body mass index is 18.99 kg/m. If this is out of the aformentioned range listed, please consider follow up with your Primary Care Provider.   ________________________________________________________  The [facility] providers would like to encourage you to use MYCHART to communicate with providers for non-urgent requests or questions.  Due to long hold times on the telephone, sending your provider a message by [facility] may be a faster and more efficient way to get a response.  Please allow 48 business hours for a response.  Please remember that this is for non-urgent requests.  _______________________________________________________

## 2024-05-04 ENCOUNTER — Encounter: Payer: Self-pay | Admitting: Physician Assistant

## 2024-05-06 ENCOUNTER — Encounter (HOSPITAL_BASED_OUTPATIENT_CLINIC_OR_DEPARTMENT_OTHER): Payer: Self-pay

## 2024-05-06 ENCOUNTER — Ambulatory Visit (HOSPITAL_BASED_OUTPATIENT_CLINIC_OR_DEPARTMENT_OTHER)
Admission: RE | Admit: 2024-05-06 | Discharge: 2024-05-06 | Disposition: A | Discharge status: Home / Self Care | Source: Ambulatory Visit | Attending: Physician Assistant | Admitting: Physician Assistant

## 2024-05-06 DIAGNOSIS — N3289 Other specified disorders of bladder: Secondary | ICD-10-CM | POA: Insufficient documentation

## 2024-05-06 DIAGNOSIS — K5730 Diverticulosis of large intestine without perforation or abscess without bleeding: Secondary | ICD-10-CM | POA: Diagnosis not present

## 2024-05-06 DIAGNOSIS — R942 Abnormal results of pulmonary function studies: Secondary | ICD-10-CM | POA: Diagnosis present

## 2024-05-06 MED ORDER — IOHEXOL 300 MG/ML  SOLN
100.0000 mL | Freq: Once | INTRAMUSCULAR | Status: AC | PRN
  Administered 2024-05-06: 100 mL via INTRAVENOUS

## 2024-05-06 NOTE — Progress Notes (Signed)
 Agree with the assessment and plan as outlined by Ellouise Console, PA-C.  Dysphagia sounds more pharyngeal than esophageal based on description.  Await MBS results.

## 2024-05-10 ENCOUNTER — Encounter: Payer: Self-pay | Admitting: Physician Assistant

## 2024-05-10 ENCOUNTER — Ambulatory Visit: Payer: Self-pay | Admitting: Physician Assistant

## 2024-05-16 ENCOUNTER — Other Ambulatory Visit: Payer: Self-pay | Admitting: Physician Assistant

## 2024-05-16 ENCOUNTER — Ambulatory Visit (INDEPENDENT_AMBULATORY_CARE_PROVIDER_SITE_OTHER)

## 2024-05-16 DIAGNOSIS — R3121 Asymptomatic microscopic hematuria: Secondary | ICD-10-CM

## 2024-05-16 DIAGNOSIS — R3129 Other microscopic hematuria: Secondary | ICD-10-CM

## 2024-05-16 DIAGNOSIS — N3289 Other specified disorders of bladder: Secondary | ICD-10-CM

## 2024-05-16 LAB — POCT URINALYSIS DIP (CLINITEK)
Bilirubin, UA: NEGATIVE
Glucose, UA: NEGATIVE mg/dL
Ketones, POC UA: NEGATIVE mg/dL
Leukocytes, UA: NEGATIVE
Nitrite, UA: NEGATIVE
POC PROTEIN,UA: NEGATIVE
Spec Grav, UA: 1.01 (ref 1.010–1.025)
Urobilinogen, UA: NEGATIVE U/dL — AB
pH, UA: 6 (ref 5.0–8.0)

## 2024-05-16 NOTE — Progress Notes (Signed)
 Patient came in for UA recheck and showed patient had Blood in her urine. So per Camie Moats [facility] send urine culture with reflex and will call the patient once we received the results.

## 2024-05-16 NOTE — Addendum Note (Signed)
 Addended by: FOREST BOWLING A on: 05/16/2024 04:27 PM   Modules accepted: Orders

## 2024-05-16 NOTE — Addendum Note (Signed)
 Addended by: FOREST BOWLING A on: 05/16/2024 04:25 PM   Modules accepted: Orders

## 2024-05-18 LAB — UA/M W/RFLX CULTURE, ROUTINE

## 2024-05-20 ENCOUNTER — Encounter: Payer: Self-pay | Admitting: Pulmonary Disease

## 2024-05-20 ENCOUNTER — Ambulatory Visit: Payer: Self-pay | Admitting: Physician Assistant

## 2024-05-20 ENCOUNTER — Ambulatory Visit: Admitting: Pulmonary Disease

## 2024-05-20 ENCOUNTER — Telehealth: Payer: Self-pay | Admitting: Pulmonary Disease

## 2024-05-20 VITALS — BP 124/68 | HR 66 | Ht 65.0 in | Wt 117.0 lb

## 2024-05-20 DIAGNOSIS — J432 Centrilobular emphysema: Secondary | ICD-10-CM

## 2024-05-20 DIAGNOSIS — R1310 Dysphagia, unspecified: Secondary | ICD-10-CM | POA: Diagnosis not present

## 2024-05-20 DIAGNOSIS — R3121 Asymptomatic microscopic hematuria: Secondary | ICD-10-CM

## 2024-05-20 DIAGNOSIS — J449 Chronic obstructive pulmonary disease, unspecified: Secondary | ICD-10-CM

## 2024-05-20 DIAGNOSIS — C069 Malignant neoplasm of mouth, unspecified: Secondary | ICD-10-CM

## 2024-05-20 DIAGNOSIS — R911 Solitary pulmonary nodule: Secondary | ICD-10-CM | POA: Insufficient documentation

## 2024-05-20 DIAGNOSIS — Z85819 Personal history of malignant neoplasm of unspecified site of lip, oral cavity, and pharynx: Secondary | ICD-10-CM

## 2024-05-20 DIAGNOSIS — Z87891 Personal history of nicotine dependence: Secondary | ICD-10-CM

## 2024-05-20 DIAGNOSIS — R634 Abnormal weight loss: Secondary | ICD-10-CM

## 2024-05-20 NOTE — Patient Instructions (Signed)
 We will schedule you for navigational robotic assisted bronchoscopy to biopsy the right upper lung nodule and perform endobronchial ultrasound biopsies of the lymph nodes in your chest.  Please have your ENT team address whether it is safe to perform oral endotracheal intubation based on your prior surgical and cancer history. They may need to consider flexible laryngoscopy evaluation during your upcoming visit.  We will follow up the the modified barium swallow test results  Continue trelegy ellipta  1 puff daily - rinse mouth out after each use  Consider extra protein shake in the afternoon evening time to help maintain your weight.  Follow up in 3 months

## 2024-05-20 NOTE — H&P (View-Only) (Signed)
 New Patient Pulmonology Office Visit   Subjective:  Patient ID: Tammy Brady, female    DOB: 1951/03/11  MRN: [id number]  Referred by: Nicholaus Credit, PA-C  CC:  Chief Complaint  Patient presents with   Consult    Pt states scan  showed something     Discussed the use of AI scribe software for clinical note transcription with the patient, who gave verbal consent to proceed.  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 She is accompanied by her husband, [name]. She was referred for evaluation of lung nodules identified on a CT scan.  CT Chest scan 04/10/2024 showed 10.38mm spiculated RUL lung nodule, with a follow-up PET scan 05/04/24 indicating increased activity, raising concerns for a potential malignancy. A year prior, her lung screening CT was clear. 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  She has a significant smoking history of two packs per day for 20 years, quitting in 2022 after her cancer diagnosis. She reports significant weight loss of approximately 50 pounds since spring 2023, following the removal of her feeding tube. She attributes this to difficulty swallowing, requiring fluids to aid in food passage, and uses protein supplements to maintain weight.  She has modified barium swallow test scheduled for this week along with follow up with her [facility] ENT team.   She experiences shortness of breath, particularly during physical activity, ongoing for about a year and a half. She uses Trelegy daily without noticeable improvement and lacks a current albuterol  inhaler. No  persistent cough or wheezing is present, with any cough due to throat clearing. She has a history of emphysema.  We reviewed her CT Chest scan and PET Scan in detail. We discussed moving forward with biopsy of the nodule and performing EBUS to sample the hilar and mediastinal lymph nodes. Risks of procedure including bleeding and pneumothorax were reviewed with a 1-2% chance of experiencing these complications. She has verbally agreed to be scheduled for procedure pending evaluation by her ENT team to determine if she can be safely undergo oral endotracheal intubation.     Review of Systems  Constitutional:  Positive for weight loss.  HENT:  Negative for congestion.   Respiratory:  Positive for shortness of breath. Negative for cough, hemoptysis, sputum production and wheezing.   Cardiovascular:  Negative for chest pain and leg swelling.  Gastrointestinal:  Negative for abdominal pain, heartburn, nausea and vomiting.  Musculoskeletal:  Negative for joint pain and myalgias.    Allergies: Codeine and Oxycodone   Current Outpatient Medications:    acetaminophen  (TYLENOL ) 500 MG tablet, Take 500 mg by mouth every 6 (six) hours as needed for mild pain., Disp: , Rfl:    albuterol  (VENTOLIN  HFA) 108 (90 Base) MCG/ACT inhaler, Inhale 2 puffs into the lungs every 6 (six) hours as needed for wheezing or shortness of breath., Disp: 8 g, Rfl: 5   aspirin  EC 81 MG tablet, Take 1 tablet (81 mg total) by mouth daily. Swallow whole., Disp: 90 tablet, Rfl: 3   calcium  carbonate (OS-CAL) 600 MG TABS, Take 600 mg by mouth daily., Disp: , Rfl:  diltiazem  (CARDIZEM  CD) 240 MG 24 hr capsule, Take 1 capsule (240 mg total) by mouth daily., Disp: 100 capsule, Rfl: 1   Fluticasone -Umeclidin-Vilant (TRELEGY ELLIPTA ) 100-62.5-25 MCG/ACT AEPB, Take 1 Inhalation by mouth daily., Disp: 3 each, Rfl: 3   levothyroxine  (SYNTHROID ) 88 MCG tablet, TAKE 1 TABLET BY MOUTH DAILY  BEFORE BREAKFAST, Disp: 100 tablet, Rfl: 2    nitroGLYCERIN  (NITROSTAT ) 0.4 MG SL tablet, Place 1 tablet (0.4 mg total) under the tongue every 5 (five) minutes as needed., Disp: 25 tablet, Rfl: 6   simvastatin  (ZOCOR ) 40 MG tablet, TAKE 1 TABLET BY MOUTH AT  BEDTIME, Disp: 100 tablet, Rfl: 1 Past Medical History:  Diagnosis Date   Aortic atherosclerosis    CAD (coronary artery disease)    Mild non-obstructive disease by cath January 2011. Coronary CTA showed  mild (25-49%) plaque in the RCA and minimal (<25%) coronary CTA 04/2021 with coronary Ca score 257.  Cor Ca score 448 with 25-49% RCA and <25% pLCx and pLAD by coronary CTA 03/2022   Carotid artery disease    1 to 39% right ICA stenosis and 40 to 59% left ICA stenosis by Dopplers 02/2022   GERD (gastroesophageal reflux disease)    Hyperlipidemia    Hypertension    Hypothyroidism    Squamous cell carcinoma of mouth (HCC)    s/p resection and XRT and chemo   Past Surgical History:  Procedure Laterality Date   APPENDECTOMY     EXCISION ORAL TUMOR  01/18/2021   see details [facility]   IR IMAGING GUIDED PORT INSERTION  03/02/2021   IR REMOVAL TUN ACCESS W/ PORT W/O FL MOD SED  08/23/2022   Family History  Problem Relation Age of Onset   Cancer Mother        esophageal   Heart disease Father    Coronary artery disease Father    Hyperlipidemia Sister    Hyperlipidemia Sister    Hyperlipidemia Brother    Hyperlipidemia Brother    Hyperlipidemia Brother    Coronary artery disease Maternal Grandmother    Social History   Socioeconomic History   Marital status: Married    Spouse name: [name]   Number of children: 1   Years of education: Not on file   Highest education level: 12th grade  Occupational History   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  Tobacco Use   Smoking status: Former    Current packs/day: 0.00    Average packs/day: 1 pack/day for 40.0 years (40.0 ttl pk-yrs)    Types: Cigarettes    Start date: 05/11/1969    Quit date: 05/11/2009     Years since quitting: 15.0   Smokeless tobacco: Never  Vaping Use   Vaping status: Never Used  Substance and Sexual Activity   Alcohol use: Not Currently   Drug use: No   Sexual activity: Not Currently  Other Topics Concern   Not on file  Social History Narrative   Not on file   Social Drivers of Health   Financial Resource Strain: Low Risk  (03/23/2024)   Overall Financial Resource Strain (CARDIA)    Difficulty of Paying Living Expenses: Not hard at all  Food Insecurity: No Food Insecurity (03/23/2024)   Hunger Vital Sign    Worried About Running Out of Food in the Last Year: Never true    Ran Out of Food in the Last Year: Never true  Transportation Needs: No Transportation Needs (03/23/2024)   PRAPARE - Transportation  Lack of Transportation (Medical): No    Lack of Transportation (Non-Medical): No  Physical Activity: Sufficiently Active (03/23/2024)   Exercise Vital Sign    Days of Exercise per Week: 4 days    Minutes of Exercise per Session: 40 min  Stress: No Stress Concern Present (03/23/2024)   [employer] of Occupational Health - Occupational Stress Questionnaire    Feeling of Stress: Only a little  Social Connections: Socially Integrated (03/23/2024)   Social Connection and Isolation Panel    Frequency of Communication with Friends and Family: Once a week    Frequency of Social Gatherings with Friends and Family: More than three times a week    Attends Religious Services: More than 4 times per year    Active Member of [employer] or Organizations: Yes    Attends Engineer, Structural: More than 4 times per year    Marital Status: Married  Catering Manager Violence: Not At Risk (03/21/2023)   Humiliation, Afraid, Rape, and Kick questionnaire    Fear of Current or Ex-Partner: No    Emotionally Abused: No    Physically Abused: No    Sexually Abused: No       Objective:  BP 124/68   Pulse 66   Ht 5' 5 (1.651 m) Comment: per pt  Wt 117 lb (53.1 kg)    SpO2 96%   BMI 19.47 kg/m  Wt Readings from Last 3 Encounters:  05/20/24 117 lb (53.1 kg)  05/03/24 114 lb 2 oz (51.8 kg)  03/27/24 115 lb 9.6 oz (52.4 kg)    Physical Exam Constitutional:      General: She is not in acute distress.    Appearance: Normal appearance. She is not ill-appearing.  HENT:     Head: Normocephalic and atraumatic.  Eyes:     General: No scleral icterus.    Conjunctiva/sclera: Conjunctivae normal.  Cardiovascular:     Rate and Rhythm: Normal rate and regular rhythm.     Pulses: Normal pulses.     Heart sounds: Normal heart sounds. No murmur heard. Pulmonary:     Effort: Pulmonary effort is normal.     Breath sounds: Normal breath sounds. No wheezing, rhonchi or rales.  Musculoskeletal:     Right lower leg: No edema.     Left lower leg: No edema.  Neurological:     Mental Status: She is alert.  Psychiatric:        Mood and Affect: Mood normal.        Behavior: Behavior normal.        Thought Content: Thought content normal.        Judgment: Judgment normal.     Diagnostic Review:  Last CBC Lab Results  Component Value Date   WBC 5.4 03/27/2024   HGB 13.3 03/27/2024   HCT 41.5 03/27/2024   MCV 98 (H) 03/27/2024   MCH 31.4 03/27/2024   RDW 12.0 03/27/2024   PLT 226 03/27/2024   Last metabolic panel Lab Results  Component Value Date   GLUCOSE 81 03/27/2024   NA 140 03/27/2024   K 5.5 (H) 03/27/2024   CL 103 03/27/2024   CO2 23 03/27/2024   BUN 19 03/27/2024   CREATININE 0.82 03/27/2024   EGFR 76 03/27/2024   CALCIUM  9.7 03/27/2024   PROT 6.6 03/27/2024   ALBUMIN 4.4 03/27/2024   LABGLOB 2.2 03/27/2024   AGRATIO 2.4 (H) 09/13/2022   BILITOT 0.3 03/27/2024   ALKPHOS 60 03/27/2024   AST 25 03/27/2024  ALT 13 03/27/2024   ANIONGAP 8 07/19/2021       Assessment & Plan:   Assessment & Plan Lung nodule  Squamous cell carcinoma of mouth (HCC)  Centrilobular emphysema (HCC)   Assessment and Plan Assessment & Plan Right  upper lobe pulmonary nodule and PET avid right hilar lymph nodes, suspicious for malignancy Increased PET scan activity in right upper lobe and hilar lymph nodes suggestive of malignancy. Discussed biopsy via robotic assisted navigation bronchoscopy.  Explained biopsy risks including bleeding and pneumothorax, potentially higher risk due to emphysema in area of nodule. She agreed to proceed. - Schedule bronchoscopy with navigation and EBUS for biopsy of right upper lobe nodule. - Need to confirm ability to safely intubated based on ENT recommendations at upcoming visit - May need to be intubated via bronchoscopy guided approach - Will consider alternative biopsy approach if intubation is unsafe.  History of squamous cell carcinoma of oral cavity, status post resection and chemoradiation Squamous cell carcinoma treated with resection and chemoradiation in 2022. Current pulmonary findings may indicate recurrence or new primary lung cancer. - Continue regular follow-up with ENT and oncologist.  Dysphagia with unintentional weight loss Dysphagia with significant weight loss since feeding tube removal in 2023. Difficulty swallowing due to limited teeth and chewing ability. Weight loss of approximately 50 pounds since 2023, with recent stabilization. - Continue protein supplementation with Fairlife and whey protein powder. - Consider adding a second protein drink in the evening. - Monitor weight and nutritional intake closely. - Follow up MBS study  Emphysema and chronic obstructive pulmonary disease (COPD) - Continue Trelegy as prescribed.     Return in about 3 months (around 08/20/2024) for f/u visit Dr. Kara.   Dorn KATHEE Kara, MD

## 2024-05-20 NOTE — Progress Notes (Signed)
 New Patient Pulmonology Office Visit   Subjective:  Patient ID: Tammy Brady, female    DOB: 1951/03/11  MRN: [id number]  Referred by: Nicholaus Credit, PA-C  CC:  Chief Complaint  Patient presents with   Consult    Pt states scan  showed something     Discussed the use of AI scribe software for clinical note transcription with the patient, who gave verbal consent to proceed.  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 She is accompanied by her husband, [name]. She was referred for evaluation of lung nodules identified on a CT scan.  CT Chest scan 04/10/2024 showed 10.38mm spiculated RUL lung nodule, with a follow-up PET scan 05/04/24 indicating increased activity, raising concerns for a potential malignancy. A year prior, her lung screening CT was clear. 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  She has a significant smoking history of two packs per day for 20 years, quitting in 2022 after her cancer diagnosis. She reports significant weight loss of approximately 50 pounds since spring 2023, following the removal of her feeding tube. She attributes this to difficulty swallowing, requiring fluids to aid in food passage, and uses protein supplements to maintain weight.  She has modified barium swallow test scheduled for this week along with follow up with her [facility] ENT team.   She experiences shortness of breath, particularly during physical activity, ongoing for about a year and a half. She uses Trelegy daily without noticeable improvement and lacks a current albuterol  inhaler. No  persistent cough or wheezing is present, with any cough due to throat clearing. She has a history of emphysema.  We reviewed her CT Chest scan and PET Scan in detail. We discussed moving forward with biopsy of the nodule and performing EBUS to sample the hilar and mediastinal lymph nodes. Risks of procedure including bleeding and pneumothorax were reviewed with a 1-2% chance of experiencing these complications. She has verbally agreed to be scheduled for procedure pending evaluation by her ENT team to determine if she can be safely undergo oral endotracheal intubation.     Review of Systems  Constitutional:  Positive for weight loss.  HENT:  Negative for congestion.   Respiratory:  Positive for shortness of breath. Negative for cough, hemoptysis, sputum production and wheezing.   Cardiovascular:  Negative for chest pain and leg swelling.  Gastrointestinal:  Negative for abdominal pain, heartburn, nausea and vomiting.  Musculoskeletal:  Negative for joint pain and myalgias.    Allergies: Codeine and Oxycodone   Current Outpatient Medications:    acetaminophen  (TYLENOL ) 500 MG tablet, Take 500 mg by mouth every 6 (six) hours as needed for mild pain., Disp: , Rfl:    albuterol  (VENTOLIN  HFA) 108 (90 Base) MCG/ACT inhaler, Inhale 2 puffs into the lungs every 6 (six) hours as needed for wheezing or shortness of breath., Disp: 8 g, Rfl: 5   aspirin  EC 81 MG tablet, Take 1 tablet (81 mg total) by mouth daily. Swallow whole., Disp: 90 tablet, Rfl: 3   calcium  carbonate (OS-CAL) 600 MG TABS, Take 600 mg by mouth daily., Disp: , Rfl:  diltiazem  (CARDIZEM  CD) 240 MG 24 hr capsule, Take 1 capsule (240 mg total) by mouth daily., Disp: 100 capsule, Rfl: 1   Fluticasone -Umeclidin-Vilant (TRELEGY ELLIPTA ) 100-62.5-25 MCG/ACT AEPB, Take 1 Inhalation by mouth daily., Disp: 3 each, Rfl: 3   levothyroxine  (SYNTHROID ) 88 MCG tablet, TAKE 1 TABLET BY MOUTH DAILY  BEFORE BREAKFAST, Disp: 100 tablet, Rfl: 2    nitroGLYCERIN  (NITROSTAT ) 0.4 MG SL tablet, Place 1 tablet (0.4 mg total) under the tongue every 5 (five) minutes as needed., Disp: 25 tablet, Rfl: 6   simvastatin  (ZOCOR ) 40 MG tablet, TAKE 1 TABLET BY MOUTH AT  BEDTIME, Disp: 100 tablet, Rfl: 1 Past Medical History:  Diagnosis Date   Aortic atherosclerosis    CAD (coronary artery disease)    Mild non-obstructive disease by cath January 2011. Coronary CTA showed  mild (25-49%) plaque in the RCA and minimal (<25%) coronary CTA 04/2021 with coronary Ca score 257.  Cor Ca score 448 with 25-49% RCA and <25% pLCx and pLAD by coronary CTA 03/2022   Carotid artery disease    1 to 39% right ICA stenosis and 40 to 59% left ICA stenosis by Dopplers 02/2022   GERD (gastroesophageal reflux disease)    Hyperlipidemia    Hypertension    Hypothyroidism    Squamous cell carcinoma of mouth (HCC)    s/p resection and XRT and chemo   Past Surgical History:  Procedure Laterality Date   APPENDECTOMY     EXCISION ORAL TUMOR  01/18/2021   see details [facility]where   IR IMAGING GUIDED PORT INSERTION  03/02/2021   IR REMOVAL TUN ACCESS W/ PORT W/O FL MOD SED  08/23/2022   Family History  Problem Relation Age of Onset   Cancer Mother        esophageal   Heart disease Father    Coronary artery disease Father    Hyperlipidemia Sister    Hyperlipidemia Sister    Hyperlipidemia Brother    Hyperlipidemia Brother    Hyperlipidemia Brother    Coronary artery disease Maternal Grandmother    Social History   Socioeconomic History   Marital status: Married    Spouse name: [name]   Number of children: 1   Years of education: Not on file   Highest education level: 12th grade  Occupational History   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  Tobacco Use   Smoking status: Former    Current packs/day: 0.00    Average packs/day: 1 pack/day for 40.0 years (40.0 ttl pk-yrs)    Types: Cigarettes    Start date: 05/11/1969    Quit date: 05/11/2009     Years since quitting: 15.0   Smokeless tobacco: Never  Vaping Use   Vaping status: Never Used  Substance and Sexual Activity   Alcohol use: Not Currently   Drug use: No   Sexual activity: Not Currently  Other Topics Concern   Not on file  Social History Narrative   Not on file   Social Drivers of Health   Financial Resource Strain: Low Risk  (03/23/2024)   Overall Financial Resource Strain (CARDIA)    Difficulty of Paying Living Expenses: Not hard at all  Food Insecurity: No Food Insecurity (03/23/2024)   Hunger Vital Sign    Worried About Running Out of Food in the Last Year: Never true    Ran Out of Food in the Last Year: Never true  Transportation Needs: No Transportation Needs (03/23/2024)   PRAPARE - Transportation  Lack of Transportation (Medical): No    Lack of Transportation (Non-Medical): No  Physical Activity: Sufficiently Active (03/23/2024)   Exercise Vital Sign    Days of Exercise per Week: 4 days    Minutes of Exercise per Session: 40 min  Stress: No Stress Concern Present (03/23/2024)   [employer] of Occupational Health - Occupational Stress Questionnaire    Feeling of Stress: Only a little  Social Connections: Socially Integrated (03/23/2024)   Social Connection and Isolation Panel    Frequency of Communication with Friends and Family: Once a week    Frequency of Social Gatherings with Friends and Family: More than three times a week    Attends Religious Services: More than 4 times per year    Active Member of [employer] or Organizations: Yes    Attends Engineer, Structural: More than 4 times per year    Marital Status: Married  Catering Manager Violence: Not At Risk (03/21/2023)   Humiliation, Afraid, Rape, and Kick questionnaire    Fear of Current or Ex-Partner: No    Emotionally Abused: No    Physically Abused: No    Sexually Abused: No       Objective:  BP 124/68   Pulse 66   Ht 5' 5 (1.651 m) Comment: per pt  Wt 117 lb (53.1 kg)    SpO2 96%   BMI 19.47 kg/m  Wt Readings from Last 3 Encounters:  05/20/24 117 lb (53.1 kg)  05/03/24 114 lb 2 oz (51.8 kg)  03/27/24 115 lb 9.6 oz (52.4 kg)    Physical Exam Constitutional:      General: She is not in acute distress.    Appearance: Normal appearance. She is not ill-appearing.  HENT:     Head: Normocephalic and atraumatic.  Eyes:     General: No scleral icterus.    Conjunctiva/sclera: Conjunctivae normal.  Cardiovascular:     Rate and Rhythm: Normal rate and regular rhythm.     Pulses: Normal pulses.     Heart sounds: Normal heart sounds. No murmur heard. Pulmonary:     Effort: Pulmonary effort is normal.     Breath sounds: Normal breath sounds. No wheezing, rhonchi or rales.  Musculoskeletal:     Right lower leg: No edema.     Left lower leg: No edema.  Neurological:     Mental Status: She is alert.  Psychiatric:        Mood and Affect: Mood normal.        Behavior: Behavior normal.        Thought Content: Thought content normal.        Judgment: Judgment normal.     Diagnostic Review:  Last CBC Lab Results  Component Value Date   WBC 5.4 03/27/2024   HGB 13.3 03/27/2024   HCT 41.5 03/27/2024   MCV 98 (H) 03/27/2024   MCH 31.4 03/27/2024   RDW 12.0 03/27/2024   PLT 226 03/27/2024   Last metabolic panel Lab Results  Component Value Date   GLUCOSE 81 03/27/2024   NA 140 03/27/2024   K 5.5 (H) 03/27/2024   CL 103 03/27/2024   CO2 23 03/27/2024   BUN 19 03/27/2024   CREATININE 0.82 03/27/2024   EGFR 76 03/27/2024   CALCIUM  9.7 03/27/2024   PROT 6.6 03/27/2024   ALBUMIN 4.4 03/27/2024   LABGLOB 2.2 03/27/2024   AGRATIO 2.4 (H) 09/13/2022   BILITOT 0.3 03/27/2024   ALKPHOS 60 03/27/2024   AST 25 03/27/2024  ALT 13 03/27/2024   ANIONGAP 8 07/19/2021       Assessment & Plan:   Assessment & Plan Lung nodule  Squamous cell carcinoma of mouth (HCC)  Centrilobular emphysema (HCC)   Assessment and Plan Assessment & Plan Right  upper lobe pulmonary nodule and PET avid right hilar lymph nodes, suspicious for malignancy Increased PET scan activity in right upper lobe and hilar lymph nodes suggestive of malignancy. Discussed biopsy via robotic assisted navigation bronchoscopy.  Explained biopsy risks including bleeding and pneumothorax, potentially higher risk due to emphysema in area of nodule. She agreed to proceed. - Schedule bronchoscopy with navigation and EBUS for biopsy of right upper lobe nodule. - Need to confirm ability to safely intubated based on ENT recommendations at upcoming visit - May need to be intubated via bronchoscopy guided approach - Will consider alternative biopsy approach if intubation is unsafe.  History of squamous cell carcinoma of oral cavity, status post resection and chemoradiation Squamous cell carcinoma treated with resection and chemoradiation in 2022. Current pulmonary findings may indicate recurrence or new primary lung cancer. - Continue regular follow-up with ENT and oncologist.  Dysphagia with unintentional weight loss Dysphagia with significant weight loss since feeding tube removal in 2023. Difficulty swallowing due to limited teeth and chewing ability. Weight loss of approximately 50 pounds since 2023, with recent stabilization. - Continue protein supplementation with [employer] and whey protein powder. - Consider adding a second protein drink in the evening. - Monitor weight and nutritional intake closely. - Follow up MBS study  Emphysema and chronic obstructive pulmonary disease (COPD) - Continue Trelegy as prescribed.     Return in about 3 months (around 08/20/2024) for f/u visit Dr. Kara.   Dorn KATHEE Kara, MD

## 2024-05-20 NOTE — Telephone Encounter (Signed)
 Please schedule the following:  Provider performing procedure: Lamar Chris, MD Diagnosis: Lung Nodule Which side if for nodule / mass? Right Procedure: Robotic Assisted Navigation Bronchoscopy and EBUS  Has patient been spoken to by Provider and given informed consent? yes Anesthesia: general Do you need Fluro? yes Duration of procedure: 1.5 hours Date: 11/18 Alternate Date: earliest possible date  Time: anytime Location: [facility] Does patient have OSA? no DM? no Or Latex allergy? no Medication Restriction/ Anticoagulate/Antiplatelet: hold aspirin  5 days before procedure Pre-op Labs Ordered:determined by Anesthesia Imaging request: n/a  (If, SuperDimension CT Chest, please have STAT courier sent to ENDO)   Thanks, Dorn Chill, MD [facility] Office: [phone]   See Amion for personal pager PCCM on call pager [phone] until 7pm. Please call Elink 7p-7a. [phone]

## 2024-05-21 ENCOUNTER — Encounter: Payer: Self-pay | Admitting: Pulmonary Disease

## 2024-05-21 NOTE — Telephone Encounter (Signed)
 2X LVDM about appointment to patient. Sending letter to patient. Routing to bristol-myers squibb

## 2024-05-23 ENCOUNTER — Ambulatory Visit (HOSPITAL_COMMUNITY)
Admission: RE | Admit: 2024-05-23 | Discharge: 2024-05-23 | Disposition: A | Source: Ambulatory Visit | Attending: Physician Assistant | Admitting: Physician Assistant

## 2024-05-23 ENCOUNTER — Ambulatory Visit (HOSPITAL_COMMUNITY)
Admission: RE | Admit: 2024-05-23 | Discharge: 2024-05-23 | Disposition: A | Discharge status: Home / Self Care | Source: Ambulatory Visit | Attending: Physician Assistant | Admitting: Physician Assistant

## 2024-05-23 DIAGNOSIS — R1310 Dysphagia, unspecified: Secondary | ICD-10-CM

## 2024-05-23 DIAGNOSIS — R1312 Dysphagia, oropharyngeal phase: Secondary | ICD-10-CM | POA: Diagnosis not present

## 2024-05-23 DIAGNOSIS — R059 Cough, unspecified: Secondary | ICD-10-CM

## 2024-05-23 DIAGNOSIS — R1319 Other dysphagia: Secondary | ICD-10-CM | POA: Insufficient documentation

## 2024-05-23 DIAGNOSIS — T17998A Other foreign object in respiratory tract, part unspecified causing other injury, initial encounter: Secondary | ICD-10-CM

## 2024-05-23 DIAGNOSIS — R1313 Dysphagia, pharyngeal phase: Secondary | ICD-10-CM

## 2024-05-27 ENCOUNTER — Ambulatory Visit: Payer: Self-pay | Admitting: Physician Assistant

## 2024-05-27 ENCOUNTER — Encounter (HOSPITAL_COMMUNITY): Payer: Self-pay | Admitting: Emergency Medicine

## 2024-05-27 ENCOUNTER — Other Ambulatory Visit: Payer: Self-pay

## 2024-05-27 DIAGNOSIS — R634 Abnormal weight loss: Secondary | ICD-10-CM

## 2024-05-27 DIAGNOSIS — R1313 Dysphagia, pharyngeal phase: Secondary | ICD-10-CM

## 2024-05-27 NOTE — Progress Notes (Signed)
 I sent patient modified barium swallow test results through MyChart. There is no evidence of aspiration. Please go ahead and schedule a regular barium swallow with tablet test to further evaluate dysphagia.  Rule out esophageal stricture. Thank you, Ellouise Console, PA-C

## 2024-05-27 NOTE — Progress Notes (Signed)
 SDW CALL  Patient was given pre-op instructions over the phone. The opportunity was given for the patient to ask questions. No further questions asked. Patient verbalized understanding of instructions given.   PCP - Nicholaus Credit, PA-C Cardiologist - Traci Turner,MD Pulmonologist - Dorn Kara COME  PPM/ICD - denies Device Orders -  Rep Notified -   Chest x-ray - na EKG - 03/02/23 Stress Test - 01/13/21 ECHO - 07/17/09 Cardiac Cath - 07/16/09  Sleep Study - denies CPAP - no  Fasting Blood Sugar - na Checks Blood Sugar _na____ times a day  Blood Thinner Instructions:na Aspirin  Instructions:Hold aspirin  5 days prior to procedure. Pt report she took her last dose on 11/11.   ERAS Protcol -NPO PRE-SURGERY Ensure or G2-   COVID TEST- na   Anesthesia review:   Patient denies shortness of breath, fever, cough and chest pain over the phone call   Special instructions:    Oral Hygiene is also important to reduce your risk of infection.  Remember - BRUSH YOUR TEETH THE MORNING OF SURGERY WITH YOUR REGULAR TOOTHPASTE

## 2024-05-27 NOTE — Anesthesia Preprocedure Evaluation (Addendum)
 Anesthesia Evaluation  Patient identified by MRN, date of birth, ID band Patient awake    Reviewed: Allergy & Precautions, NPO status , Patient's Chart, lab work & pertinent test results  History of Anesthesia Complications Negative for: history of anesthetic complications  Airway Mallampati: IV  TM Distance: >3 FB Neck ROM: Limited  Mouth opening: Limited Mouth Opening  Dental  (+) Missing, Dental Advisory Given   Pulmonary COPD,  COPD inhaler, former smoker S/p trach   breath sounds clear to auscultation       Cardiovascular hypertension, Pt. on medications (-) angina + CAD (non-obstructive) and + Peripheral Vascular Disease   Rhythm:Regular Rate:Normal  '22 Stress: low risk, EF 62%, no ischemia   Neuro/Psych  Headaches  Anxiety Depression       GI/Hepatic Neg liver ROS,GERD  ,,  Endo/Other  Hypothyroidism    Renal/GU negative Renal ROS     Musculoskeletal   Abdominal   Peds  Hematology negative hematology ROS (+)   Anesthesia Other Findings Ca mouth: trach '22 with XRT  Reproductive/Obstetrics                              Anesthesia Physical Anesthesia Plan  ASA: 3  Anesthesia Plan: General   Post-op Pain Management: Tylenol  PO (pre-op)*   Induction: Intravenous  PONV Risk Score and Plan: 3 and Ondansetron , Dexamethasone  and TIVA  Airway Management Planned: Oral ETT and Video Laryngoscope Planned  Additional Equipment: None  Intra-op Plan:   Post-operative Plan: Extubation in OR  Informed Consent: I have reviewed the patients History and Physical, chart, labs and discussed the procedure including the risks, benefits and alternatives for the proposed anesthesia with the patient or authorized representative who has indicated his/her understanding and acceptance.     Dental advisory given  Plan Discussed with: CRNA and Surgeon  Anesthesia Plan Comments: (PAT note by  Lynwood Hope, PA-C: 73 year old female follows with cardiology for history of CAD, HLD, HTN, carotid stenosis (1 to 39% R ICA and 40 to 59% LICA by duplex 02/2023).  Patient admitted in 2011 with NSTEMI. LHC found only mild, non-obstructive CAD and symptoms attributed to coronary vasospasm. She was treated with Cardizem  and sublingual nitroglycerin .  She was reevaluated in September 2023 for recurrence of chest pain. Symptoms improved with nitroglycerin . No exertional component reported. A coronary CTA was ordered and patient found with calcium  score of 448. RCA with diffuse minimal to mild mixed stenosis. LM normal. LAD with minimal mixed proximal stenosis. LCX with minimal mixed stenosis.  Recommend continue medical therapy.  Last seen in cardiology follow-up by Artist Pouch, PA-C on 03/02/2023 she was noted to be doing well, exercising 4 to 5 days/week without any chest pain or shortness of breath, no nitroglycerin  use in the past year.  No changes to management.  Former smoker, 40-pack-year history, quit 2022.  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 She was scheduled for flap debulking and revision on 12/02/21, although surgery was aborted due to difficulties with nasotracheal intubation causing severe epistaxis and need for packing.  Last seen in follow-up by otolaryngologist Dr. Barbie on 11/22/2023 and noted to be doing well at that time, no evidence of disease on exam, 88-month follow-up recommended.  CT Chest scan 04/10/2024 showed 10.50mm spiculated RUL lung nodule, with a follow-up  PET scan 05/04/24 indicating increased activity, raising concerns for a potential malignancy.  Seen by pulmonologist Dr. Kara on 05/20/2024 and recommended to undergo bronchoscopy with biopsy.  Other pertinent history includes  hypothyroidism, GERD, emphysema/COPD.  She will need day of surgery labs and evaluation.  EKG 03/02/2023: Sinus bradycardia.  Rate 55. Nonspecific T wave abnormality  Coronary CTA 04/07/2022: IMPRESSION: 1. Minimal mixed non-obstructive CAD, CADRADS = 1.   2. Coronary calcium  score of 448. This was 92nd percentile for age and sex matched control.   3. Normal coronary origin with right dominance.   4. Dilated main pulmonary artery at 31 mm, suggestive of pulmonary hypertension.   5. Aggressive cardiovascular risk factor modification is recommended.   6. Consider non-coronary causes of chest pain.    )         Anesthesia Quick Evaluation

## 2024-05-27 NOTE — Progress Notes (Signed)
 Anesthesia Chart Review: Same day workup  73 year old female follows with cardiology for history of CAD, HLD, HTN, carotid stenosis (1 to 39% R ICA and 40 to 59% LICA by duplex 02/2023).  Patient admitted in 2011 with NSTEMI. LHC found only mild, non-obstructive CAD and symptoms attributed to coronary vasospasm. She was treated with Cardizem  and sublingual nitroglycerin .  She was reevaluated in September 2023 for recurrence of chest pain. Symptoms improved with nitroglycerin . No exertional component reported. A coronary CTA was ordered and patient found with calcium  score of 448. RCA with diffuse minimal to mild mixed stenosis. LM normal. LAD with minimal mixed proximal stenosis. LCX with minimal mixed stenosis.  Recommend continue medical therapy.  Last seen in cardiology follow-up by Artist Pouch, PA-C on 03/02/2023 she was noted to be doing well, exercising 4 to 5 days/week without any chest pain or shortness of breath, no nitroglycerin  use in the past year.  No changes to management.  Former smoker, 40-pack-year history, quit 2022.  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 She was scheduled for flap debulking and revision on 12/02/21, although surgery was aborted due to difficulties with nasotracheal intubation causing severe epistaxis and need for packing.  Last seen in follow-up by otolaryngologist Dr. Barbie on 11/22/2023 and noted to be doing well at that time, no evidence of disease on exam, 74-month follow-up recommended.  CT Chest scan 04/10/2024 showed 10.49mm spiculated RUL lung nodule, with a follow-up PET scan 05/04/24 indicating increased activity, raising concerns for a potential malignancy.  Seen by pulmonologist Dr. Kara on 05/20/2024 and recommended to undergo bronchoscopy with biopsy.  Other pertinent  history includes hypothyroidism, GERD, emphysema/COPD.  She will need day of surgery labs and evaluation.  EKG 03/02/2023: Sinus bradycardia.  Rate 55. Nonspecific T wave abnormality  Coronary CTA 04/07/2022: IMPRESSION: 1. Minimal mixed non-obstructive CAD, CADRADS = 1.   2. Coronary calcium  score of 448. This was 92nd percentile for age and sex matched control.   3. Normal coronary origin with right dominance.   4. Dilated main pulmonary artery at 31 mm, suggestive of pulmonary hypertension.   5. Aggressive cardiovascular risk factor modification is recommended.   6. Consider non-coronary causes of chest pain.     Lynwood Geofm RIGGERS [facility]/Anesthesiology Phone [phone] 05/27/2024 4:03 PM

## 2024-05-28 ENCOUNTER — Ambulatory Visit (HOSPITAL_COMMUNITY)

## 2024-05-28 ENCOUNTER — Ambulatory Visit (HOSPITAL_COMMUNITY)
Admission: RE | Admit: 2024-05-28 | Discharge: 2024-05-28 | Disposition: A | Discharge status: Home / Self Care | Attending: Emergency Medicine | Admitting: Emergency Medicine

## 2024-05-28 ENCOUNTER — Encounter (HOSPITAL_COMMUNITY): Payer: Self-pay | Admitting: Emergency Medicine

## 2024-05-28 ENCOUNTER — Encounter (HOSPITAL_COMMUNITY)
Admission: RE | Disposition: A | Discharge status: Home / Self Care | Payer: Self-pay | Source: Home / Self Care | Attending: Emergency Medicine

## 2024-05-28 ENCOUNTER — Ambulatory Visit (HOSPITAL_BASED_OUTPATIENT_CLINIC_OR_DEPARTMENT_OTHER): Admitting: Physician Assistant

## 2024-05-28 ENCOUNTER — Ambulatory Visit (HOSPITAL_COMMUNITY): Admitting: Physician Assistant

## 2024-05-28 ENCOUNTER — Other Ambulatory Visit: Payer: Self-pay

## 2024-05-28 DIAGNOSIS — I2510 Atherosclerotic heart disease of native coronary artery without angina pectoris: Secondary | ICD-10-CM | POA: Insufficient documentation

## 2024-05-28 DIAGNOSIS — Z923 Personal history of irradiation: Secondary | ICD-10-CM | POA: Insufficient documentation

## 2024-05-28 DIAGNOSIS — I10 Essential (primary) hypertension: Secondary | ICD-10-CM | POA: Insufficient documentation

## 2024-05-28 DIAGNOSIS — Z87891 Personal history of nicotine dependence: Secondary | ICD-10-CM | POA: Insufficient documentation

## 2024-05-28 DIAGNOSIS — J432 Centrilobular emphysema: Secondary | ICD-10-CM | POA: Diagnosis not present

## 2024-05-28 DIAGNOSIS — Z85818 Personal history of malignant neoplasm of other sites of lip, oral cavity, and pharynx: Secondary | ICD-10-CM | POA: Insufficient documentation

## 2024-05-28 DIAGNOSIS — Z9221 Personal history of antineoplastic chemotherapy: Secondary | ICD-10-CM | POA: Diagnosis not present

## 2024-05-28 DIAGNOSIS — I739 Peripheral vascular disease, unspecified: Secondary | ICD-10-CM | POA: Diagnosis not present

## 2024-05-28 DIAGNOSIS — K219 Gastro-esophageal reflux disease without esophagitis: Secondary | ICD-10-CM | POA: Insufficient documentation

## 2024-05-28 DIAGNOSIS — R911 Solitary pulmonary nodule: Secondary | ICD-10-CM

## 2024-05-28 DIAGNOSIS — E039 Hypothyroidism, unspecified: Secondary | ICD-10-CM | POA: Diagnosis not present

## 2024-05-28 HISTORY — PX: FUDUCIAL PLACEMENT: SHX5083

## 2024-05-28 HISTORY — PX: CRYOTHERAPY: SHX6894

## 2024-05-28 HISTORY — DX: Headache, unspecified: R51.9

## 2024-05-28 HISTORY — DX: Chronic obstructive pulmonary disease, unspecified: J44.9

## 2024-05-28 HISTORY — PX: VIDEO BRONCHOSCOPY WITH ENDOBRONCHIAL NAVIGATION: SHX6175

## 2024-05-28 HISTORY — PX: BRONCHIAL NEEDLE ASPIRATION BIOPSY: SHX5106

## 2024-05-28 LAB — BASIC METABOLIC PANEL WITH GFR
Anion gap: 15 (ref 5–15)
BUN: 18 mg/dL (ref 8–23)
CO2: 24 mmol/L (ref 22–32)
Calcium: 9.4 mg/dL (ref 8.9–10.3)
Chloride: 104 mmol/L (ref 98–111)
Creatinine, Ser: 0.73 mg/dL (ref 0.44–1.00)
GFR, Estimated: >60 mL/min (ref >60)
Glucose, Bld: 92 mg/dL (ref 70–99)
Potassium: 4.4 mmol/L (ref 3.5–5.1)
Sodium: 143 mmol/L (ref 135–145)

## 2024-05-28 LAB — CBC
HCT: 38 % (ref 36.0–46.0)
Hemoglobin: 12.5 g/dL (ref 12.0–15.0)
MCH: 31.6 pg (ref 26.0–34.0)
MCHC: 32.9 g/dL (ref 30.0–36.0)
MCV: 96.2 fL (ref 80.0–100.0)
Platelets: 221 K/uL (ref 150–400)
RBC: 3.95 MIL/uL (ref 3.87–5.11)
RDW: 12.6 % (ref 11.5–15.5)
WBC: 5.3 K/uL (ref 4.0–10.5)
nRBC: 0 % (ref 0.0–0.2)

## 2024-05-28 SURGERY — VIDEO BRONCHOSCOPY WITH ENDOBRONCHIAL NAVIGATION
Anesthesia: General | Laterality: Right

## 2024-05-28 MED ORDER — PHENYLEPHRINE 80 MCG/ML (10ML) SYRINGE FOR IV PUSH (FOR BLOOD PRESSURE SUPPORT)
PREFILLED_SYRINGE | INTRAVENOUS | Status: DC | PRN
  Administered 2024-05-28: 120 ug via INTRAVENOUS
  Administered 2024-05-28: 80 ug via INTRAVENOUS
  Administered 2024-05-28: 120 ug via INTRAVENOUS

## 2024-05-28 MED ORDER — CHLORHEXIDINE GLUCONATE 0.12 % MT SOLN
15.0000 mL | Freq: Once | OROMUCOSAL | Status: AC
  Administered 2024-05-28: 15 mL via OROMUCOSAL
  Filled 2024-05-28: qty 15

## 2024-05-28 MED ORDER — FENTANYL CITRATE (PF) 250 MCG/5ML IJ SOLN
INTRAMUSCULAR | Status: DC | PRN
  Administered 2024-05-28: 25 ug via INTRAVENOUS
  Administered 2024-05-28: 50 ug via INTRAVENOUS

## 2024-05-28 MED ORDER — FENTANYL CITRATE (PF) 100 MCG/2ML IJ SOLN
INTRAMUSCULAR | Status: AC
  Filled 2024-05-28: qty 2

## 2024-05-28 MED ORDER — PROPOFOL 1000 MG/100ML IV EMUL
INTRAVENOUS | Status: AC
  Filled 2024-05-28: qty 400

## 2024-05-28 MED ORDER — ROCURONIUM BROMIDE 10 MG/ML (PF) SYRINGE
PREFILLED_SYRINGE | INTRAVENOUS | Status: DC | PRN
  Administered 2024-05-28: 10 mg via INTRAVENOUS
  Administered 2024-05-28: 50 mg via INTRAVENOUS

## 2024-05-28 MED ORDER — PHENYLEPHRINE HCL-NACL 20-0.9 MG/250ML-% IV SOLN
INTRAVENOUS | Status: AC
  Filled 2024-05-28: qty 250

## 2024-05-28 MED ORDER — SUGAMMADEX SODIUM 200 MG/2ML IV SOLN
INTRAVENOUS | Status: DC | PRN
  Administered 2024-05-28: 200 mg via INTRAVENOUS

## 2024-05-28 MED ORDER — LIDOCAINE 2% (20 MG/ML) 5 ML SYRINGE
INTRAMUSCULAR | Status: DC | PRN
  Administered 2024-05-28: 20 mg via INTRAVENOUS

## 2024-05-28 MED ORDER — LACTATED RINGERS IV SOLN: INTRAVENOUS | Status: DC

## 2024-05-28 MED ORDER — DEXAMETHASONE SOD PHOSPHATE PF 10 MG/ML IJ SOLN
INTRAMUSCULAR | Status: DC | PRN
  Administered 2024-05-28: 8 mg via INTRAVENOUS

## 2024-05-28 MED ORDER — ONDANSETRON HCL 4 MG/2ML IJ SOLN
INTRAMUSCULAR | Status: DC | PRN
  Administered 2024-05-28: 4 mg via INTRAVENOUS

## 2024-05-28 MED ORDER — PROPOFOL 10 MG/ML IV BOLUS
INTRAVENOUS | Status: DC | PRN
  Administered 2024-05-28: 150 ug/kg/min via INTRAVENOUS
  Administered 2024-05-28: 100 mg via INTRAVENOUS

## 2024-05-28 SURGICAL SUPPLY — 37 items
ADAPTER BRONCHOSCOPE OLYMPUS (ADAPTER) ×3 IMPLANT
ADAPTER VALVE BIOPSY EBUS (MISCELLANEOUS) IMPLANT
BAG COUNTER SPONGE SURGICOUNT (BAG) ×3 IMPLANT
BRUSH CYTOL CELLEBRITY 1.5X140 (MISCELLANEOUS) ×3 IMPLANT
BRUSH SUPERTRAX BIOPSY (INSTRUMENTS) IMPLANT
BRUSH SUPERTRAX NDL-TIP CYTO (INSTRUMENTS) ×3 IMPLANT
CANISTER SUCTION 3000ML PPV (SUCTIONS) ×3 IMPLANT
CNTNR URN SCR LID CUP LEK RST (MISCELLANEOUS) ×3 IMPLANT
COVER BACK TABLE 60X90IN (DRAPES) ×3 IMPLANT
FILTER STRAW FLUID ASPIR (MISCELLANEOUS) IMPLANT
FORCEPS BIOP 1.5 SINGLE USE (MISCELLANEOUS) ×3 IMPLANT
FORCEPS BIOP SUPERTRX PREMAR (INSTRUMENTS) ×3 IMPLANT
GAUZE SPONGE 4X4 12PLY STRL (GAUZE/BANDAGES/DRESSINGS) ×3 IMPLANT
GLOVE BIO SURGEON STRL SZ7.5 (GLOVE) ×6 IMPLANT
GOWN STRL REUS W/ TWL LRG LVL3 (GOWN DISPOSABLE) ×6 IMPLANT
KIT CLEAN ENDO COMPLIANCE (KITS) ×3 IMPLANT
KIT LOCATABLE GUIDE (CANNULA) IMPLANT
KIT MARKER FIDUCIAL DELIVERY (KITS) IMPLANT
KIT TURNOVER KIT B (KITS) ×3 IMPLANT
MARKER SKIN DUAL TIP RULER LAB (MISCELLANEOUS) ×3 IMPLANT
NDL SUPERTRX PREMARK BIOPSY (NEEDLE) ×3 IMPLANT
NEEDLE SUPERTRX PREMARK BIOPSY (NEEDLE) ×3 IMPLANT
OIL SILICONE PENTAX (PARTS (SERVICE/REPAIRS)) ×3 IMPLANT
PAD ARMBOARD POSITIONER FOAM (MISCELLANEOUS) ×6 IMPLANT
PATCHES PATIENT (LABEL) ×9 IMPLANT
SOLN 0.9% NACL POUR BTL 1000ML (IV SOLUTION) ×3 IMPLANT
SOLN STERILE WATER BTL 1000 ML (IV SOLUTION) ×3 IMPLANT
SYR 20ML ECCENTRIC (SYRINGE) ×3 IMPLANT
SYR 20ML LL LF (SYRINGE) ×3 IMPLANT
SYR 50ML SLIP (SYRINGE) ×3 IMPLANT
Superlock IMPLANT
TOWEL GREEN STERILE FF (TOWEL DISPOSABLE) ×3 IMPLANT
TRAP SPECIMEN MUCUS 40CC (MISCELLANEOUS) IMPLANT
TUBE CONNECTING 20X1/4 (TUBING) ×3 IMPLANT
UNDERPAD 30X36 HEAVY ABSORB (UNDERPADS AND DIAPERS) ×3 IMPLANT
VALVE BIOPSY SINGLE USE (MISCELLANEOUS) ×3 IMPLANT
VALVE SUCTION BRONCHIO DISP (MISCELLANEOUS) ×3 IMPLANT

## 2024-05-28 NOTE — Anesthesia Procedure Notes (Signed)
 Procedure Name: Intubation Date/Time: 05/28/2024 7:49 AM  Performed by: Roslynn Waddell LABOR, CRNAPre-anesthesia Checklist: Patient identified, Emergency Drugs available, Suction available and Patient being monitored Patient Re-evaluated:Patient Re-evaluated prior to induction Oxygen Delivery Method: Circle System Utilized Preoxygenation: Pre-oxygenation with 100% oxygen Induction Type: IV induction Ventilation: Mask ventilation without difficulty Laryngoscope Size: Glidescope and 3 Grade View: Grade II Tube type: Oral Tube size: 7.5 mm Number of attempts: 1 Airway Equipment and Method: Stylet and Oral airway Placement Confirmation: ETT inserted through vocal cords under direct vision, positive ETCO2 and breath sounds checked- equal and bilateral Secured at: 22 cm Tube secured with: Tape Dental Injury: Teeth and Oropharynx as per pre-operative assessment  Comments: Patient with history of oral cancer with radiation and surgery. Limited cervical extension. GS 3 blade used by SRNA with grade 2a view. No bleeding or trauma while placing airway. Airway gently and easily placed through glottis. +EBBS and +EtCO2.

## 2024-05-28 NOTE — Anesthesia Postprocedure Evaluation (Signed)
 Anesthesia Post Note  Patient: Tatayana Beshears Valeri  Procedure(s) Performed: VIDEO BRONCHOSCOPY WITH ENDOBRONCHIAL NAVIGATION (Right) BRONCHOSCOPY, WITH NEEDLE ASPIRATION BIOPSY CRYOTHERAPY INSERTION, FIDUCIAL MARKER, GOLD     Patient location during evaluation: PACU Anesthesia Type: General Level of consciousness: awake and alert, oriented and patient cooperative Pain management: pain level controlled Vital Signs Assessment: post-procedure vital signs reviewed and stable Respiratory status: spontaneous breathing, nonlabored ventilation and respiratory function stable Cardiovascular status: blood pressure returned to baseline and stable Postop Assessment: no apparent nausea or vomiting Anesthetic complications: no   No notable events documented.  Last Vitals:  Vitals:   05/28/24 0930 05/28/24 0945  BP: (!) 144/64 (!) 154/66  Pulse: (!) 51 (!) 52  Resp: 16 15  Temp:  36.6 C  SpO2: 97% 97%    Last Pain:  Vitals:   05/28/24 0945  TempSrc:   PainSc: 0-No pain                 Ceriah Kohler,E. Jahad Old

## 2024-05-28 NOTE — Addendum Note (Signed)
 Addendum  created 05/28/24 1559 by Roslynn Waddell LABOR, CRNA   Intraprocedure Event edited, Intraprocedure Meds edited, Intraprocedure Staff edited

## 2024-05-28 NOTE — Interval H&P Note (Signed)
 History and Physical Interval Note:  05/28/2024 7:28 AM  Tammy Brady  has presented today for surgery, with the diagnosis of Lung Nodule.  The various methods of treatment have been discussed with the patient and family. After consideration of risks, benefits and other options for treatment, the patient has consented to  Procedure(s) with comments: VIDEO BRONCHOSCOPY WITH ENDOBRONCHIAL NAVIGATION (Right) - Potential difficult intubation, hx of oral squamous cell cancer s/p surgery, chemo/radiation BRONCHOSCOPY, WITH EBUS (N/A) as a surgical intervention.  The patient's history has been reviewed, patient examined, no change in status, stable for surgery.  I have reviewed the patient's chart and labs.  Questions were answered to the patient's satisfaction.     Lamar GORMAN Chris

## 2024-05-28 NOTE — Op Note (Signed)
 Video Bronchoscopy with Robotic Assisted Bronchoscopic Navigation   Date of Operation: 05/28/2024   Pre-op Diagnosis: Right upper lobe pulmonary nodule  Post-op Diagnosis: Same  Surgeon: Lamar Chris  Assistants: None  Anesthesia: General endotracheal anesthesia  Operation: Flexible video fiberoptic bronchoscopy with robotic assistance and biopsies.  Estimated Blood Loss: Minimal  Complications: None  Indications and History: Tammy Brady is a 73 y.o. female with history of squamous cell cancer of the oropharynx.  She was found to have a right upper lobe pulmonary nodule on surveillance imaging with some moderate hypermetabolism on PET scan.  Recommendation made to achieve a tissue diagnosis via robotic assisted navigational bronchoscopy.  The risks, benefits, complications, treatment options and expected outcomes were discussed with the patient.  The possibilities of pneumothorax, pneumonia, reaction to medication, pulmonary aspiration, perforation of a viscus, bleeding, failure to diagnose a condition and creating a complication requiring transfusion or operation were discussed with the patient who freely signed the consent.    Description of Procedure: The patient was seen in the Preoperative Area, was examined and was deemed appropriate to proceed.  The patient was taken to [facility] Endoscopy room 3, identified as Macario DELENA Rams and the procedure verified as Flexible Video Fiberoptic Bronchoscopy.  A Time Out was held and the above information confirmed.   Prior to the date of the procedure a high-resolution CT scan of the chest was performed. Utilizing ION software program a virtual tracheobronchial tree was generated to allow the creation of distinct navigation pathways to the patient's parenchymal abnormalities. After being taken to the operating room general anesthesia was initiated and the patient  was orally intubated. The video fiberoptic bronchoscope was introduced via the endotracheal  tube and a general inspection was performed which showed normal right and left lung anatomy. Aspiration of the bilateral mainstems was completed to remove any remaining secretions. Robotic catheter inserted into patient's endotracheal tube.   Target #1 right upper lobe pulmonary nodule: The distinct navigation pathways prepared prior to this procedure were then utilized to navigate to patient's lesion identified on CT scan. The robotic catheter was secured into place and the vision probe was withdrawn.  Lesion location was approximated using fluoroscopy.  Local registration and targeting was performed using [employer] Cios mobile C-arm three-dimensional imaging. Under fluoroscopic guidance transbronchial needle biopsies and transbronchial cryoprobe biopsies were performed to be sent for cytology and pathology.  A transbronchial brush was attempted but the brush would not pass through the catheter so no sample was obtained.  Needle-in-lesion was confirmed using Cios mobile C-arm.  A bronchioalveolar lavage was performed in the right upper lobe adjacent to the nodule and sent for cytology and microbiology.  Under fluoroscopic guidance a single fiducial marker was placed adjacent to the nodule.   At the end of the procedure a general airway inspection was performed and there was no evidence of active bleeding. The bronchoscope was removed.  The patient tolerated the procedure well. There was no significant blood loss and there were no obvious complications. A post-procedural chest x-ray is pending.  Samples Target #1: 1. Transbronchial Wang needle biopsies from right upper lobe nodule 2. Transbronchial cryoprobe biopsies from right upper lobe nodule 3. Bronchoalveolar lavage from right upper lobe   Plans:  The patient will be discharged from the PACU to home when recovered from anesthesia and after chest x-ray is reviewed. We will review the cytology, pathology and microbiology results with the  patient when they become available. Outpatient followup will be  with Dr Kara.    Lamar Chris, MD, PhD 05/28/2024, 8:40 AM [facility] [phone] or if no answer before 7:00PM call [phone] For any issues after 7:00PM please call eLink [phone]

## 2024-05-28 NOTE — Discharge Instructions (Signed)
 Flexible Bronchoscopy, Care After This sheet gives you information about how to care for yourself after your test. Your doctor may also give you more specific instructions. If you have problems or questions, contact your doctor. Follow these instructions at home: Eating and drinking When you are wide awake, your numbness is gone and your cough and gag reflexes have come back, you may: Start eating only soft foods. Slowly drink liquids. Six hours after the test, go back to your normal diet. Driving Do not drive for 24 hours if you were given a medicine to help you relax (sedative). Do not drive or use heavy machinery while taking prescription pain medicine. General instructions Take over-the-counter and prescription medicines only as told by your doctor. Return to your normal activities as told. Ask what activities are safe for you. Do not use any products that have nicotine or tobacco in them. This includes cigarettes and e-cigarettes. If you need help quitting, ask your doctor. Keep all follow-up visits as told by your doctor. This is important. It is very important if you had a tissue sample (biopsy) taken. Get help right away if: You have shortness of breath that gets worse. You get light-headed. You feel like you are going to pass out (faint). You have chest pain. You cough up: More than a little blood. More blood than before. Summary Do not use cigarettes. Do not use e-cigarettes. Seek care in the Emergency Department right away if you have chest pain or shortness of breath. Call or MyChart Message our office for any questions or problems at [phone].  Okay to restart your aspirin  on 05/29/2024   This information is not intended to replace advice given to you by your health care provider. Make sure you discuss any questions you have with your health care provider.

## 2024-05-28 NOTE — Op Note (Signed)
 Procedure Note  Patient: Tammy Brady  [employer] Cios mobile C-arm was utilized to identify and biopsy right upper lobe pulmonary nodule.  Needle-in-lesion was confirmed using real-time Cios imaging, and images were uploaded to PACS.      Lamar Chris, MD, PhD 05/28/2024, 8:45 AM [facility] [phone] or if no answer before 7:00PM call [phone] For any issues after 7:00PM please call eLink [phone]

## 2024-05-28 NOTE — Transfer of Care (Signed)
 Immediate Anesthesia Transfer of Care Note  Patient: Tammy Brady  Procedure(s) Performed: VIDEO BRONCHOSCOPY WITH ENDOBRONCHIAL NAVIGATION (Right) BRONCHOSCOPY, WITH NEEDLE ASPIRATION BIOPSY CRYOTHERAPY INSERTION, FIDUCIAL MARKER, GOLD  Patient Location: PACU  Anesthesia Type:General  Level of Consciousness: awake, alert , and oriented  Airway & Oxygen Therapy: Patient Spontanous Breathing  Post-op Assessment: Report given to RN and Post -op Vital signs reviewed and stable  Post vital signs: Reviewed and stable  Last Vitals:  Vitals Value Taken Time  BP 125/61 05/28/24 08:46  Temp    Pulse 51 05/28/24 08:49  Resp 12 05/28/24 08:49  SpO2 96 % 05/28/24 08:49  Vitals shown include unfiled device data.  Last Pain:  Vitals:   05/28/24 9361  TempSrc:   PainSc: 0-No pain         Complications: No notable events documented.

## 2024-05-29 ENCOUNTER — Encounter (HOSPITAL_COMMUNITY): Payer: Self-pay | Admitting: Emergency Medicine

## 2024-05-29 LAB — CYTOLOGY - NON PAP

## 2024-05-31 ENCOUNTER — Ambulatory Visit (INDEPENDENT_AMBULATORY_CARE_PROVIDER_SITE_OTHER)

## 2024-05-31 ENCOUNTER — Ambulatory Visit: Admitting: Emergency Medicine

## 2024-05-31 VITALS — BP 104/68 | HR 73 | Temp 97.9°F | Ht 65.0 in | Wt 117.6 lb

## 2024-05-31 DIAGNOSIS — Z0000 Encounter for general adult medical examination without abnormal findings: Secondary | ICD-10-CM

## 2024-05-31 DIAGNOSIS — J432 Centrilobular emphysema: Secondary | ICD-10-CM

## 2024-05-31 DIAGNOSIS — E038 Other specified hypothyroidism: Secondary | ICD-10-CM | POA: Diagnosis not present

## 2024-05-31 MED ORDER — LEVOTHYROXINE SODIUM 88 MCG PO TABS: 88.0000 ug | ORAL_TABLET | Freq: Every day | ORAL | 2 refills | Status: AC

## 2024-05-31 MED ORDER — ALBUTEROL SULFATE HFA 108 (90 BASE) MCG/ACT IN AERS: 2.0000 | INHALATION_SPRAY | Freq: Four times a day (QID) | RESPIRATORY_TRACT | 5 refills | Status: AC | PRN

## 2024-05-31 NOTE — Patient Instructions (Signed)
 Tammy Brady , Thank you for taking time to come for your Medicare Wellness Visit. I appreciate your ongoing commitment to your health goals. Please review the following plan we discussed and let me know if I can assist you in the future.   These are the goals we discussed:  Goals       Activity and Exercise Increased      Evidence-based guidance:  Review current exercise levels.  Assess patient perspective on exercise or activity level, barriers to increasing activity, motivation and readiness for change.  Recommend or set healthy exercise goal based on individual tolerance.  Encourage small steps toward making change in amount of exercise or activity.  Urge reduction of sedentary activities or screen time.  Promote group activities within the community or with family or support person.  Consider referral to rehabiliation therapist for assessment and exercise/activity plan.   Notes: Work out 4 times a week @ home on her [employer] and walks her dog about a 1/2 mile/ day       Manage My Medicine      Timeframe:  Long-Range Goal Priority:  High Start Date:                             Expected End Date:                       Follow Up Date 06/22/22   - use a pillbox to sort medicine    Why is this important?   These steps will help you keep on track with your medicines.   Notes:       Patient Stated (pt-stated)      Patient stated she would like to stay active and live another year.      Track and Manage My Blood Pressure-Hypertension      Timeframe:  Long-Range Goal Priority:  High Start Date:                             Expected End Date:                       Follow Up Date 06/22/22   - choose a place to take my blood pressure (home, clinic or office, retail store) - write blood pressure results in a log or diary    Why is this important?   You won't feel high blood pressure, but it can still hurt your blood vessels.  High blood pressure can cause heart or kidney problems.  It can also cause a stroke.  Making lifestyle changes like losing a little weight or eating less salt will help.  Checking your blood pressure at home and at different times of the day can help to control blood pressure.  If the doctor prescribes medicine remember to take it the way the doctor ordered.  Call the office if you cannot afford the medicine or if there are questions about it.     Notes:         This is a list of the screening recommended for you and due dates:  Health Maintenance  Topic Date Due   Breast Cancer Screening  10/02/2024   Screening for Lung Cancer  04/10/2025   Medicare Annual Wellness Visit  05/31/2025   Osteoporosis screening with Bone Density Scan  10/18/2025   Colon Cancer Screening  04/09/2028  Pneumococcal Vaccine for age over 86  Completed   Flu Shot  Completed   Meningitis B Vaccine  Aged Out   DTaP/Tdap/Td vaccine  Discontinued   COVID-19 Vaccine  Discontinued   Hepatitis C Screening  Discontinued   Zoster (Shingles) Vaccine  Discontinued    Advanced directives: yes  Next appointment: Follow up in one year for your annual wellness visit 12.02.2026 at 11:00    Preventive Care 65 Years and Older, Female Preventive care refers to lifestyle choices and visits with your health care provider that can promote health and wellness. What does preventive care include? A yearly physical exam. This is also called an annual well check. Dental exams once or twice a year. Routine eye exams. Ask your health care provider how often you should have your eyes checked. Personal lifestyle choices, including: Daily care of your teeth and gums. Regular physical activity. Eating a healthy diet. Avoiding tobacco and drug use. Limiting alcohol use. Practicing safe sex. Taking low-dose aspirin  every day. Taking vitamin and mineral supplements as recommended by your health care provider. What happens during an annual well check? The services and screenings done  by your health care provider during your annual well check will depend on your age, overall health, lifestyle risk factors, and family history of disease. Counseling  Your health care provider may ask you questions about your: Alcohol use. Tobacco use. Drug use. Emotional well-being. Home and relationship well-being. Sexual activity. Eating habits. History of falls. Memory and ability to understand (cognition). Work and work astronomer. Reproductive health. Screening  You may have the following tests or measurements: Height, weight, and BMI. Blood pressure. Lipid and cholesterol levels. These may be checked every 5 years, or more frequently if you are over 70 years old. Skin check. Lung cancer screening. You may have this screening every year starting at age 96 if you have a 30-pack-year history of smoking and currently smoke or have quit within the past 15 years. Fecal occult blood test (FOBT) of the stool. You may have this test every year starting at age 47. Flexible sigmoidoscopy or colonoscopy. You may have a sigmoidoscopy every 5 years or a colonoscopy every 10 years starting at age 80. Hepatitis C blood test. Hepatitis B blood test. Sexually transmitted disease (STD) testing. Diabetes screening. This is done by checking your blood sugar (glucose) after you have not eaten for a while (fasting). You may have this done every 1-3 years. Bone density scan. This is done to screen for osteoporosis. You may have this done starting at age 36. Mammogram. This may be done every 1-2 years. Talk to your health care provider about how often you should have regular mammograms. Talk with your health care provider about your test results, treatment options, and if necessary, the need for more tests. Vaccines  Your health care provider may recommend certain vaccines, such as: Influenza vaccine. This is recommended every year. Tetanus, diphtheria, and acellular pertussis (Tdap, Td) vaccine. You  may need a Td booster every 10 years. Zoster vaccine. You may need this after age 46. Pneumococcal 13-valent conjugate (PCV13) vaccine. One dose is recommended after age 76. Pneumococcal polysaccharide (PPSV23) vaccine. One dose is recommended after age 23. Talk to your health care provider about which screenings and vaccines you need and how often you need them. This information is not intended to replace advice given to you by your health care provider. Make sure you discuss any questions you have with your health care provider. Document Released:  07/24/2015 Document Revised: 03/16/2016 Document Reviewed: 04/28/2015 Elsevier Interactive Patient Education  2017 [employer].  Fall Prevention in the Home Falls can cause injuries. They can happen to people of all ages. There are many things you can do to make your home safe and to help prevent falls. What can I do on the outside of my home? Regularly fix the edges of walkways and driveways and fix any cracks. Remove anything that might make you trip as you walk through a door, such as a raised step or threshold. Trim any bushes or trees on the path to your home. Use bright outdoor lighting. Clear any walking paths of anything that might make someone trip, such as rocks or tools. Regularly check to see if handrails are loose or broken. Make sure that both sides of any steps have handrails. Any raised decks and porches should have guardrails on the edges. Have any leaves, snow, or ice cleared regularly. Use sand or salt on walking paths during winter. Clean up any spills in your garage right away. This includes oil or grease spills. What can I do in the bathroom? Use night lights. Install grab bars by the toilet and in the tub and shower. Do not use towel bars as grab bars. Use non-skid mats or decals in the tub or shower. If you need to sit down in the shower, use a plastic, non-slip stool. Keep the floor dry. Clean up any water that spills  on the floor as soon as it happens. Remove soap buildup in the tub or shower regularly. Attach bath mats securely with double-sided non-slip rug tape. Do not have throw rugs and other things on the floor that can make you trip. What can I do in the bedroom? Use night lights. Make sure that you have a light by your bed that is easy to reach. Do not use any sheets or blankets that are too big for your bed. They should not hang down onto the floor. Have a firm chair that has side arms. You can use this for support while you get dressed. Do not have throw rugs and other things on the floor that can make you trip. What can I do in the kitchen? Clean up any spills right away. Avoid walking on wet floors. Keep items that you use a lot in easy-to-reach places. If you need to reach something above you, use a strong step stool that has a grab bar. Keep electrical cords out of the way. Do not use floor polish or wax that makes floors slippery. If you must use wax, use non-skid floor wax. Do not have throw rugs and other things on the floor that can make you trip. What can I do with my stairs? Do not leave any items on the stairs. Make sure that there are handrails on both sides of the stairs and use them. Fix handrails that are broken or loose. Make sure that handrails are as long as the stairways. Check any carpeting to make sure that it is firmly attached to the stairs. Fix any carpet that is loose or worn. Avoid having throw rugs at the top or bottom of the stairs. If you do have throw rugs, attach them to the floor with carpet tape. Make sure that you have a light switch at the top of the stairs and the bottom of the stairs. If you do not have them, ask someone to add them for you. What else can I do to help prevent falls?  Wear shoes that: Do not have high heels. Have rubber bottoms. Are comfortable and fit you well. Are closed at the toe. Do not wear sandals. If you use a stepladder: Make  sure that it is fully opened. Do not climb a closed stepladder. Make sure that both sides of the stepladder are locked into place. Ask someone to hold it for you, if possible. Clearly mark and make sure that you can see: Any grab bars or handrails. First and last steps. Where the edge of each step is. Use tools that help you move around (mobility aids) if they are needed. These include: Canes. Walkers. Scooters. Crutches. Turn on the lights when you go into a dark area. Replace any light bulbs as soon as they burn out. Set up your furniture so you have a clear path. Avoid moving your furniture around. If any of your floors are uneven, fix them. If there are any pets around you, be aware of where they are. Review your medicines with your doctor. Some medicines can make you feel dizzy. This can increase your chance of falling. Ask your doctor what other things that you can do to help prevent falls. This information is not intended to replace advice given to you by your health care provider. Make sure you discuss any questions you have with your health care provider. Document Released: 04/23/2009 Document Revised: 12/03/2015 Document Reviewed: 08/01/2014 Elsevier Interactive Patient Education  2017 [employer].

## 2024-05-31 NOTE — Progress Notes (Signed)
 Chief Complaint  Patient presents with   Annual Exam     Subjective:   Tammy Brady is a 73 y.o. female who presents for a Medicare Annual Wellness Visit.  Allergies (verified) Codeine and Oxycodone    History: Past Medical History:  Diagnosis Date   Aortic atherosclerosis    CAD (coronary artery disease)    Mild non-obstructive disease by cath January 2011. Coronary CTA showed  mild (25-49%) plaque in the RCA and minimal (<25%) coronary CTA 04/2021 with coronary Ca score 257.  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   Past Surgical History:  Procedure Laterality Date   APPENDECTOMY  Feb 2015   BRONCHIAL NEEDLE ASPIRATION BIOPSY  05/28/2024   Procedure: BRONCHOSCOPY, WITH NEEDLE ASPIRATION BIOPSY;  Surgeon: Shelah Lamar RAMAN, MD;  Location: [facility] ENDOSCOPY;  Service: Pulmonary;;   CRYOTHERAPY  05/28/2024   Procedure: CRYOTHERAPY;  Surgeon: Shelah Lamar RAMAN, MD;  Location: [facility] ENDOSCOPY;  Service: Pulmonary;;   EXCISION ORAL TUMOR  01/18/2021   see details [facility] Everywhere   FUDUCIAL PLACEMENT  05/28/2024   Procedure: INSERTION, FIDUCIAL MARKER, GOLD;  Surgeon: Shelah Lamar RAMAN, MD;  Location: [facility] ENDOSCOPY;  Service: Pulmonary;;   IR IMAGING GUIDED PORT INSERTION  03/02/2021   IR REMOVAL TUN ACCESS W/ PORT W/O FL MOD SED  08/23/2022   TONSILLECTOMY     TUBAL LIGATION     VIDEO BRONCHOSCOPY WITH ENDOBRONCHIAL NAVIGATION Right 05/28/2024   Procedure: VIDEO BRONCHOSCOPY WITH ENDOBRONCHIAL NAVIGATION;  Surgeon: Shelah Lamar RAMAN, MD;  Location: [facility] ENDOSCOPY;  Service: Pulmonary;   Laterality: Right;  Potential difficult intubation, hx of oral squamous cell cancer s/p surgery, chemo/radiation   Family History  Problem Relation Age of Onset   Cancer Mother        esophageal   Heart disease Father    Coronary artery disease Father    Alcohol abuse Father    COPD Father    Hyperlipidemia Sister    Obesity Sister    Hyperlipidemia Sister    Hyperlipidemia Brother    Hyperlipidemia Brother    Hyperlipidemia Brother    Coronary artery disease Maternal Grandmother    Alcohol abuse Sister    Social History   Occupational History   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  Tobacco Use   Smoking status: Former    Current packs/day: 0.00    Average packs/day: 1 pack/day for 40.0 years (40.0 ttl pk-yrs)    Types: Cigarettes    Start date: 05/11/1969    Quit date: 11/08/2020    Years since quitting: 3.5   Smokeless tobacco: Never  Vaping Use   Vaping status: Never Used  Substance and Sexual Activity   Alcohol use: Not Currently    Comment: Recovering alcoholic   Drug use: Never   Sexual activity: Not Currently   Tobacco Counseling Counseling given: Not Answered  SDOH Screenings   Food Insecurity: No Food Insecurity (05/31/2024)  Housing: Unknown (05/31/2024)  Transportation Needs: No Transportation Needs (05/31/2024)  Utilities: Not At Risk (05/31/2024)  Alcohol Screen: Low Risk  (03/21/2023)  Depression (PHQ2-9): Low Risk  (05/31/2024)  Financial Resource Strain: Low Risk  (05/30/2024)  Physical Activity: Sufficiently Active (05/31/2024)  Recent Concern: Physical Activity - Insufficiently Active (05/30/2024)  Social Connections: Socially Integrated (05/31/2024)  Stress: No Stress Concern Present (05/31/2024)  Tobacco Use: Medium Risk (05/31/2024)  Health Literacy: Adequate Health Literacy (05/31/2024)   See flowsheets for full screening details  Depression Screen PHQ 2 & 9 Depression Scale- Over the past 2 weeks, how often have you been  bothered by any of the following problems? Little interest or pleasure in doing things: 0 Feeling down, depressed, or hopeless (PHQ Adolescent also includes...irritable): 0 PHQ-2 Total Score: 0 Trouble falling or staying asleep, or sleeping too much: 2 Feeling tired or having little energy: 0 Poor appetite or overeating (PHQ Adolescent also includes...weight loss): 1 Feeling bad about yourself - or that you are a failure or have let yourself or your family down: 0 Trouble concentrating on things, such as reading the newspaper or watching television (PHQ Adolescent also includes...like school work): 0 Moving or speaking so slowly that other people could have noticed. Or the opposite - being so fidgety or restless that you have been moving around a lot more than usual: 0 Thoughts that you would be better off dead, or of hurting yourself in some way: 0 PHQ-9 Total Score: 3 If you checked off any problems, how difficult have these problems made it for you to do your work, take care of things at home, or get along with other people?: Not difficult at all  Depression Treatment Depression Interventions/Treatment : EYV7-0 Score <4 Follow-up Not Indicated     Goals Addressed               This Visit's Progress     Patient Stated (pt-stated)        Patient stated she would like to stay active and live another year.       Visit info / Clinical Intake: Medicare Wellness Visit Type:: Subsequent Annual Wellness Visit Persons participating in visit:: patient Medicare Wellness Visit Mode:: In-person (required for WTM) Information given by:: patient Pre-visit prep was completed: no AWV questionnaire completed by patient prior to visit?: no Living arrangements:: lives with spouse/significant other Patient's Overall Health Status Rating: very good Typical amount of pain: none Does pain affect daily life?: no Are you currently prescribed opioids?: no  Dietary Habits and Nutritional  Risks How many meals a day?: 3 Eats fruit and vegetables daily?: yes Most meals are obtained by: preparing own meals; eating out In the last 2 weeks, have you had any of the following?: none Diabetic:: no  Functional Status Activities of Daily Living (to include ambulation/medication): Independent Ambulation: Independent Medication Administration: Independent Home Management: Independent Manage your own finances?: yes Primary transportation is: driving Concerns about vision?: no *vision screening is required for WTM* Concerns about hearing?: no  Fall Screening Falls in the past year?: 1 Number of falls in past year: 1 Was there an injury with Fall?: 0 Fall Risk Category Calculator: 2 Patient Fall Risk Level: Moderate Fall Risk  Fall Risk Patient at Risk for Falls Due to: History of fall(s) Fall risk Follow up: Falls evaluation completed  Home and Transportation Safety: All rugs have non-skid backing?: N/A, no rugs All stairs or steps have railings?: yes Grab bars in the bathtub or shower?: yes Have non-skid surface in bathtub  or shower?: yes Good home lighting?: yes Regular seat belt use?: yes Hospital stays in the last year:: no  Cognitive Assessment Difficulty concentrating, remembering, or making decisions? : no Will 6CIT or Mini Cog be Completed: yes What year is it?: 0 points What month is it?: 0 points Give patient an address phrase to remember (5 components): [address] [place] About what time is it?: 0 points Count backwards from 20 to 1: 0 points Say the months of the year in reverse: 0 points Repeat the address phrase from earlier: 0 points 6 CIT Score: 0 points  Advance Directives (For Healthcare) Does Patient Have a Medical Advance Directive?: Yes Type of Advance Directive: Healthcare Power of [place]; Living will; Out of facility DNR (pink MOST or yellow form) Copy of Healthcare Power of Attorney in Chart?: No - copy requested Copy of Living  Will in Chart?: No - copy requested Out of facility DNR (pink MOST or yellow form) in Chart? (Ambulatory ONLY): No - copy requested  Reviewed/Updated  Reviewed/Updated: Reviewed All (Medical, Surgical, Family, Medications, Allergies, Care Teams, Patient Goals)        Objective:    Today's Vitals   05/31/24 0949  BP: 104/68  Pulse: 73  Temp: 97.9 F (36.6 C)  SpO2: 95%  Weight: 117 lb 9.6 oz (53.3 kg)  Height: 5' 5 (1.651 m)   Body mass index is 19.57 kg/m.  Current Medications (verified) Outpatient Encounter Medications as of 05/31/2024  Medication Sig   acetaminophen  (TYLENOL ) 500 MG tablet Take 500 mg by mouth every 6 (six) hours as needed for mild pain.   albuterol  (VENTOLIN  HFA) 108 (90 Base) MCG/ACT inhaler Inhale 2 puffs into the lungs every 6 (six) hours as needed for wheezing or shortness of breath.   aspirin  EC 81 MG tablet Take 1 tablet (81 mg total) by mouth daily. Swallow whole.   diltiazem  (CARDIZEM  CD) 240 MG 24 hr capsule Take 1 capsule (240 mg total) by mouth daily.   levothyroxine  (SYNTHROID ) 88 MCG tablet TAKE 1 TABLET BY MOUTH DAILY  BEFORE BREAKFAST   nitroGLYCERIN  (NITROSTAT ) 0.4 MG SL tablet Place 1 tablet (0.4 mg total) under the tongue every 5 (five) minutes as needed.   simvastatin  (ZOCOR ) 40 MG tablet TAKE 1 TABLET BY MOUTH AT  BEDTIME   calcium  carbonate (OS-CAL) 600 MG TABS Take 600 mg by mouth daily. (Patient not taking: Reported on 05/31/2024)   Fluticasone -Umeclidin-Vilant (TRELEGY ELLIPTA ) 100-62.5-25 MCG/ACT AEPB Take 1 Inhalation by mouth daily. (Patient not taking: Reported on 05/31/2024)   No facility-administered encounter medications on file as of 05/31/2024.   Hearing/Vision screen No results found. Immunizations and Health Maintenance Health Maintenance  Topic Date Due   Mammogram  10/02/2024   Lung Cancer Screening  04/10/2025   Medicare Annual Wellness (AWV)  05/31/2025   Bone Density Scan  10/18/2025   Colonoscopy  04/09/2028    Pneumococcal Vaccine: 50+ Years  Completed   Influenza Vaccine  Completed   Meningococcal B Vaccine  Aged Out   DTaP/Tdap/Td  Discontinued   COVID-19 Vaccine  Discontinued   Hepatitis C Screening  Discontinued   Zoster Vaccines- Shingrix  Discontinued        Assessment/Plan:  This is a routine wellness examination for [employer].  Patient Care Team: Nicholaus Credit, PA-C as PCP - General (Physician Assistant) Shlomo Wilbert SAUNDERS, MD as PCP - Cardiology (Cardiology) Malmfelt, Delon CROME, RN as Oncology Nurse Navigator Izell Domino, MD as Consulting Physician (Radiation Oncology) Loretha,  Amber, MD as Consulting Physician (Hematology and Oncology)  I have personally reviewed and noted the following in the patient's chart:   Medical and social history Use of alcohol, tobacco or illicit drugs  Current medications and supplements including opioid prescriptions. Functional ability and status Nutritional status Physical activity Advanced directives List of other physicians Hospitalizations, surgeries, and ER visits in previous 12 months Vitals Screenings to include cognitive, depression, and falls Referrals and appointments  No orders of the defined types were placed in this encounter.  In addition, I have reviewed and discussed with patient certain preventive protocols, quality metrics, and best practice recommendations. A written personalized care plan for preventive services as well as general preventive health recommendations were provided to patient.   [place] Mailman, [place]   05/31/2024   Return in 1 year (on 05/31/2025).  After Visit Summary: (In Person-Declined) Patient declined AVS at this time.  Nurse Notes: I spent 25 minutes with patient. Patient stated she need albuterol  inhaler and Levothyroxine  refilled. Refills has been sent to [facility] per patient's request. Patient has no questions or concerns at this time.

## 2024-06-01 LAB — ACID FAST SMEAR (AFB, MYCOBACTERIA): Acid Fast Smear: NEGATIVE

## 2024-06-02 LAB — AEROBIC/ANAEROBIC CULTURE W GRAM STAIN (SURGICAL/DEEP WOUND)
Culture: NO GROWTH
Gram Stain: NONE SEEN

## 2024-06-10 ENCOUNTER — Ambulatory Visit: Admitting: Acute Care

## 2024-06-10 ENCOUNTER — Telehealth: Payer: Self-pay | Admitting: Acute Care

## 2024-06-10 ENCOUNTER — Encounter: Payer: Self-pay | Admitting: Acute Care

## 2024-06-10 VITALS — BP 110/70 | HR 61 | Ht 65.0 in | Wt 116.2 lb

## 2024-06-10 DIAGNOSIS — R911 Solitary pulmonary nodule: Secondary | ICD-10-CM

## 2024-06-10 DIAGNOSIS — R9389 Abnormal findings on diagnostic imaging of other specified body structures: Secondary | ICD-10-CM

## 2024-06-10 DIAGNOSIS — Z923 Personal history of irradiation: Secondary | ICD-10-CM

## 2024-06-10 DIAGNOSIS — R942 Abnormal results of pulmonary function studies: Secondary | ICD-10-CM

## 2024-06-10 DIAGNOSIS — Z09 Encounter for follow-up examination after completed treatment for conditions other than malignant neoplasm: Secondary | ICD-10-CM | POA: Diagnosis not present

## 2024-06-10 DIAGNOSIS — Z85819 Personal history of malignant neoplasm of unspecified site of lip, oral cavity, and pharynx: Secondary | ICD-10-CM

## 2024-06-10 DIAGNOSIS — Z87891 Personal history of nicotine dependence: Secondary | ICD-10-CM | POA: Diagnosis not present

## 2024-06-10 DIAGNOSIS — Z9889 Other specified postprocedural states: Secondary | ICD-10-CM

## 2024-06-10 NOTE — Patient Instructions (Addendum)
 It is good to see you today.  I am glad you did well after your bronchoscopy with biopsies. We have reviewed the results of your biopsies. The right upper lobe biopsy was negative for malignant cells. There is a notation of a fungal organism present, despite the fact I did not see that in the micro. I will call the microbiology to see if they can expand on that. I will call you if I learn anything. If there is a fungus present we will send you to infectious disease for treatment. I have ordered a 16-month follow-up CT of the chest to maintain surveillance of this lung nodule. You will get a call closer to the time to get this scheduled. You will follow-up with either myself,  or Dr. Shelah to review the results. I will also speak with Dr. Shelah to see if he feels 12-month CT chest is adequate for surveillance. Please call if you have any questions. Have a good holiday. Please contact office for sooner follow up if symptoms do not improve or worsen or seek emergency care

## 2024-06-10 NOTE — Telephone Encounter (Signed)
 I have called pathology to see if they can help determine which fungal organism is present in the biopsy. All micro cultures were negative, even fungal. Dr. Belvie is out of the office this week, but Dr. Macie spoke with me and she will do some additional staining to help identify the fungal element. We will continue to monitor  for results.   I called and left a message on the patient's VM to let her know additional testing would be done to help identify the fungus. I have asked her to call the office with any questions.

## 2024-06-10 NOTE — Progress Notes (Signed)
 History of Present Illness Tammy Brady is a 73 y.o. female former smoker with a 40-pack-year smoking history, quit in 2022 ,with past medical history of oral squamous cell carcinoma referred November 2025 by Lauraine Moats, PA-C for evaluation of abnormal chest imaging.  Synopsis 73 year old female with a history of oral squamous cell carcinoma s/p composite resection of floor of mouth, mandible and ventral tongue, bilateral selective neck dissections and left free fibula reconstruction, tracheostomy who presents with spots on the lung identified on a recent CT scan.    CT Chest scan 04/10/2024 showed 10.19mm spiculated RUL lung nodule, with a follow-up PET scan 05/04/24 indicating increased activity, raising concerns for a potential malignancy. A year prior, her lung screening CT was clear. 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   She has a significant smoking history of two packs per day for 20 years, quitting in 2022 after her cancer diagnosis. She reports significant weight loss of approximately 50 pounds since spring 2023, following the removal of her feeding tube. She attributes this to difficulty swallowing, requiring fluids to aid in food passage, and uses protein supplements to maintain weight.  She experiences shortness of breath, particularly during physical activity, ongoing for about a year and a half. She uses Trelegy daily without noticeable improvement and lacks a current albuterol  inhaler. No persistent cough or wheezing is present, with any cough due to throat clearing. She has a history of emphysema.   She was see by Dr. Kara 05/20/2024. She underwent bronchoscopy with biopsy of the RUL nodule and EBUS to sample the hilar and mediastinal lymph nodes on 05/28/2024. She is here today with her husband to review results of the biopsy and for her post procedure evaluation.     06/10/2024 Discussed the use of AI scribe software for clinical note transcription with the patient, who gave verbal consent to proceed.    History of Present Illness Pt. Presents for follow up after bronchoscopy with biopsies, and EBUS to better evaluate a 10.3 mm spiculated right upper lobe pulmonary nodule.  With her history of squamous cell carcinoma in the lower mouth there was concern this could be metastatic disease.   Patient states she has done well postprocedure.  She states she had 1 day of coughing up blood clots, which self resolved.  She denies any fever, discolored secretions, worsening shortness of breath than baseline, or adverse reaction to anesthesia.  We have reviewed the results of her biopsy.  The biopsy of the right upper lobe of the lung showed no malignant cells with fungal organisms present.  However looking at the micro collected on the day of the procedure there was no positive culture for fungus.  This was confusing to both the patient and clinical staff.  I have called pathophysiology and I spoke with Dr. Macie.  Dr. Belvie who had read the original pathology is not available this week.  Dr. Arminda stated that she will order additional staining to see if they can help identify the fungus noted on pathology.  If this nodule is actually a fungal ball, we will send to infectious disease for treatment.  Plan will be for serial CT imaging of the chest to see if this responds to treatment.  If no fungus is identified, I will talk with Dr. Shelah to see what he would prefer, a 84-month follow-up CT chest versus repeat  bronchoscopy to collect fungal culture.  Both the patient and her husband are in agreement with the plan.  Per pathology the additional staining should take about 48 hours.  We will continue to monitor results, to see if we can get definitive identification of the fungal element noted on biopsy.  Patient shared with me today that she did gardening 4 hours daily for  4 -5 days 11/2023 and 12/2023, prepping their garden to sell her house.  She did not wear a mask. There were also bird droppings that she was exposed to.  This may take on increased significance if there is a fungal element identified with further testing.       Test Results: Cytology 05/28/2024 A. LUNG, RUL, FINE NEEDLE ASPIRATION:  - No malignant cells identified  - Fungal organisms present   SPECIMEN ADEQUACY:  A. Satisfactory for Evaluation   B. LUNG, RUL, LAVAGE:    FINAL MICROSCOPIC DIAGNOSIS:  - No malignant cells identified        Latest Ref Rng & Units 05/28/2024    6:49 AM 03/27/2024    9:07 AM 09/25/2023   11:25 AM  CBC  WBC 4.0 - 10.5 K/uL 5.3  5.4  6.2   Hemoglobin 12.0 - 15.0 g/dL 87.4  86.6  86.8   Hematocrit 36.0 - 46.0 % 38.0  41.5  40.4   Platelets 150 - 400 K/uL 221  226  223        Latest Ref Rng & Units 05/28/2024    6:49 AM 03/27/2024    9:07 AM 09/25/2023   11:25 AM  BMP  Glucose 70 - 99 mg/dL 92  81  74   BUN 8 - 23 mg/dL 18  19  15    Creatinine 0.44 - 1.00 mg/dL 9.26  9.17  9.24   BUN/Creat Ratio 12 - 28  23  20    Sodium 135 - 145 mmol/L 143  140  142   Potassium 3.5 - 5.1 mmol/L 4.4  5.5  5.3   Chloride 98 - 111 mmol/L 104  103  105   CO2 22 - 32 mmol/L 24  23  24    Calcium  8.9 - 10.3 mg/dL 9.4  9.7  9.4     BNP No results found for: BNP  ProBNP No results found for: PROBNP  PFT No results found for: FEV1PRE, FEV1POST, FVCPRE, FVCPOST, TLC, DLCOUNC, PREFEV1FVCRT, PSTFEV1FVCRT  DG Chest Port 1 View Result Date: 05/28/2024 EXAM: 1 VIEW(S) XRAY OF THE CHEST 05/28/2024 09:16:00 AM COMPARISON: AP and lateral radiographs of the chest dated 05/17/2021. CLINICAL HISTORY: S/P bronchoscopy with biopsy. FINDINGS: LUNGS AND PLEURA: Right upper lung opacity with superimposed surgical clip. No pleural effusion. No pneumothorax. HEART AND MEDIASTINUM: Aortic calcification. No acute abnormality of the cardiac and mediastinal  silhouettes. BONES AND SOFT TISSUES: No acute osseous abnormality. IMPRESSION: 1. Right upper lung opacity with superimposed surgical clip, possibly related to the recent bronchoscopy with biopsy. Electronically signed by: Evalene Coho MD 05/28/2024 09:40 AM EST RP Workstation: [id number]   DG C-ARM BRONCHOSCOPY Result Date: 05/28/2024 C-ARM BRONCHOSCOPY: Fluoroscopy was utilized by the requesting physician.  No radiographic interpretation.   DG SWALLOW FUNC OP MEDICARE SPEECH PATH Result Date: 05/23/2024 Table formatting from the original result was not included. Modified Barium Swallow Study Patient Details Name: Tammy Brady MRN: [id number] Date of Birth: 1950/10/30 Today's Date: 05/23/2024 HPI/PMH: HPI: pt is a 73 yo female referred for OP MBS by GI.   She has PMH +  for mouth cancer s/p surgery and chemoXRT in 2022. She also had feeding tube inserted at time of treatment and used it for 8 months - Reporting she only consumed water during the time she used the feeding tube. She has experienced 50 pound weight loss since 2023 but stablized recently.   Admits to worsening of swallow in the last six months.  She reports eating yogurts, protein shakes and milkshakes.  Reports problems with pills and food - sticking - pointing to vallecular region.  Pt is being seen by [facility] and underwent laryngoscopy prior date per her report. She denies ENT finding anything significant with laryngoscopy.  CT head and neck with contrast have been ordered by [facility] MD.  Pt reports she will occasional aspirate liquids.  To compensate for dysphagia to solids, she brings food back into her mouth and re-swallows.  She has not required the heimlich manuever nor had recurrent pneumonias. 09/2021 Lupita Connor documented that pt stopped doing her HEP.  Her PMH also + for depression, gastritis, GERD, neuropathy, mucositis, Rt hilar lung nodule, migraines.  Pt is not on a PPI per notes.    She is to have a bronchoscopy next week per  her report. Clinical Impression: Clinical Impression:   Patient presents with moderate oropharyngeal and mild cervical esophageal dysphagia due to iatrogenic effects of surgery and XRT.  Her dysphagia is characterized by impaired motor contraction resulting in residuals throughout pharynx and consistent laryngeal penetration of thin and nectar thick liquids.  Patient's self created compensation strategies effectively improve her airway protection.  She conducts up to 3-4 swallows per liquid bolus with extended breath hold. Reflexive throat clearing noted when barium reached vocal cords which is functional. Chin tuck posture did not decrease penetration or improve pharyngeal clearance with thin liquids. Effortful swallow not helpful to prevent pharyngeal retention of puree.  No aspiration observed even with sequential swallows of thin.  She also propels retained food boluses from vallecular space (retention post-swallow) into oral cavity and re-swallowed which allowed adequate clearance. In addition, she uses liquids to aid in clearing solids from pharynx into esophagus.  Posterior pharyngeal wall motility was better than tongue base retraction and thyrolaryngeal elevation.  A-P view revealed retention was uniform bilaterally. Barium tablet taken with thin was retained at vallecula due to impaired epiglottic deflection- not clearing with further boluses of thin.  Pudding bolus improved muscle contraction and effectively pushed tablet into esophagus.  Pt had sensation to tablet in proximal pharynx, however she continued to sense it in lower throat when it appeared in distal esophagus. Water bolus cleared tablet into stomach.  Pt reports she eats for sustenance, not enjoyment.  Encouraged her to continue PO intake using her created strategies.  Advised she maintain strength of her expectoration/tongue base retraction/pharyngeal motility for airway protection.   If she becomes severely deconditioned or severely ill, her  current compensations may be challenging to maintain.  DIGEST Swallow Severity Rating*  Safety:2  Efficiency:2  Overall Pharyngeal Swallow Severity: 1: mild; 2: moderate; 3: severe; 4: profound *The Dynamic Imaging Grade of Swallowing Toxicity is standardized for the head and neck cancer population, however, demonstrates promising clinical applications across populations to standardize the clinical rating of pharyngeal swallow safety and severity. Reviewed all fluoroscopy loops with patient for teach back to reasoning for mitigation strategies.  Thanks for this consult. Factors that may increase risk of adverse event in presence of aspiration Noe & Lianne 2021): Factors that may increase risk of adverse event in  presence of aspiration Noe & Lianne 2021): Reduced saliva Recommendations/Plan: Swallowing Evaluation Recommendations Swallowing Evaluation Recommendations Recommendations: PO diet PO Diet Recommendation: -- (continue diet as tolerated) Liquid Administration via: Cup; Other (Comment) (pt does not like to use straws) Medication Administration: Whole meds with liquid (can try with pudding- if sense retention that is difficult to clear) Swallowing strategies  : Slow rate; Small bites/sips; Follow solids with liquids; Multiple dry swallows after each bite/sip (start intake with liquids; multiple swallows with breath holds with liquids) Postural changes: Position pt fully upright for meals; Stay upright 30-60 min after meals Oral care recommendations: Oral care BID (2x/day) Caregiver Recommendations: -- (assure spouse knows heimlich manuever) Treatment Plan Treatment Plan Treatment recommendations: No treatment recommended at this time Follow-up recommendations: No SLP follow up Recommendations Recommendations for follow up therapy are one component of a multi-disciplinary discharge planning process, led by the attending physician.  Recommendations may be updated based on patient status, additional  functional criteria and insurance authorization. Assessment: Orofacial Exam: Orofacial Exam Oral Cavity: Oral Hygiene: WFL Oral Cavity - Dentition: Missing dentition; Other (Comment) (few teeth on right side - none on lower left) Orofacial Anatomy: Other (comment) (s/p surgeries for oral cancer, has bone graft and titanium for mandible) Oral Motor/Sensory Function: Generalized oral weakness Anatomy: Anatomy: WFL Boluses Administered: Boluses Administered Boluses Administered: Thin liquids (Level 0); Mildly thick liquids (Level 2, nectar thick); Moderately thick liquids (Level 3, honey thick); Solid; Puree  Oral Impairment Domain: Oral Impairment Domain Lip Closure: No labial escape Tongue control during bolus hold: Cohesive bolus between tongue to palatal seal Bolus preparation/mastication: Slow prolonged chewing/mashing with complete recollection Bolus transport/lingual motion: Delayed initiation of tongue motion (oral holding) Oral residue: Trace residue lining oral structures Location of oral residue : Tongue Initiation of pharyngeal swallow : Posterior laryngeal surface of the epiglottis; Pyriform sinuses  Pharyngeal Impairment Domain: Pharyngeal Impairment Domain Soft palate elevation: No bolus between soft palate (SP)/pharyngeal wall (PW) Laryngeal elevation: Partial superior movement of thyroid  cartilage/partial approximation of arytenoids to epiglottic petiole Anterior hyoid excursion: Partial anterior movement Epiglottic movement: Partial inversion Laryngeal vestibule closure: Incomplete, narrow column air/contrast in laryngeal vestibule Pharyngeal stripping wave : Present - complete Pharyngeal contraction (A/P view only): -- (mildly impaired shortening with contraction) Pharyngoesophageal segment opening: Partial distention/partial duration, partial obstruction of flow Pharyngeal residue: Collection of residue within or on pharyngeal structures Location of pharyngeal residue: Valleculae; Pharyngeal wall;  Aryepiglottic folds; Pyriform sinuses; Diffuse (>3 areas); Tongue base  Esophageal Impairment Domain: Esophageal Impairment Domain Esophageal clearance upright position: Esophageal retention Pill: Pill Consistency administered: Thin liquids (Level 0); Puree Thin liquids (Level 0): Impaired (see clinical impressions) Puree: Impaired (see clinical impressions) Penetration/Aspiration Scale Score: Penetration/Aspiration Scale Score 1.  Material does not enter airway: Moderately thick liquids (Level 3, honey thick); Puree; Solid; Pill 2.  Material enters airway, remains ABOVE vocal cords then ejected out: Mildly thick liquids (Level 2, nectar thick) 3.  Material enters airway, remains ABOVE vocal cords and not ejected out: Thin liquids (Level 0) 4.  Material enters airway, CONTACTS cords then ejected out: Thin liquids (Level 0) Compensatory Strategies: Compensatory Strategies Compensatory strategies: Yes Effortful swallow: Ineffective Ineffective Effortful Swallow: Puree Chin tuck: Ineffective Ineffective Chin Tuck: Thin liquid (Level 0)   General Information: Caregiver present: No  Diet Prior to this Study: Dysphagia 3 (mechanical soft); Thin liquids (Level 0)   No data recorded  Respiratory Status: WFL   Supplemental O2: None (Room air)   History of Recent Intubation: No  Behavior/Cognition: Alert; Cooperative; Pleasant mood Self-Feeding Abilities: Able to self-feed Baseline vocal quality/speech: Normal; Other (comment) (speech is mildly dysarthric due to her h/o surgery - removal of portion of left tongue and removal of mandible, portion of left buccal region) Volitional Cough: Able to elicit Volitional Swallow: Able to elicit Exam Limitations: No limitations Goal Planning: No data recorded No data recorded No data recorded No data recorded No data recorded Pain: Pain Assessment Pain Assessment: No/denies pain End of Session: Start Time:SLP Start Time (ACUTE ONLY): 1310 Stop Time: SLP Stop Time (ACUTE ONLY): 1415 Time  Calculation:SLP Time Calculation (min) (ACUTE ONLY): 65 min Charges: SLP Evaluations $ SLP Speech Visit: 1 Visit SLP Evaluations $Outpatient MBS Swallow: 1 Procedure $Swallowing Treatment: 1 Procedure SLP visit diagnosis: SLP Visit Diagnosis: Dysphagia, oropharyngeal phase (R13.12) Past Medical History: Past Medical History: Diagnosis Date  Aortic atherosclerosis   CAD (coronary artery disease)   Mild non-obstructive disease by cath January 2011. Coronary CTA showed  mild (25-49%) plaque in the RCA and minimal (<25%) coronary CTA 04/2021 with coronary Ca score 257.  Cor Ca score 448 with 25-49% RCA and <25% pLCx and pLAD by coronary CTA 03/2022  Carotid artery disease   1 to 39% right ICA stenosis and 40 to 59% left ICA stenosis by Dopplers 02/2022  GERD (gastroesophageal reflux disease)   Hyperlipidemia   Hypertension   Hypothyroidism   Squamous cell carcinoma of mouth (HCC)   s/p resection and XRT and chemo Past Surgical History: Past Surgical History: Procedure Laterality Date  APPENDECTOMY    EXCISION ORAL TUMOR  01/18/2021  see details [facility] Everywhere  IR IMAGING GUIDED PORT INSERTION  03/02/2021  IR REMOVAL TUN ACCESS W/ PORT W/O FL MOD SED  08/23/2022 Madelin POUR, MS [facility] SLP Acute Rehab Services Office [phone] Nicolas Emmie Caldron 05/23/2024, 4:00 PM EXAM: MODIFIED BARIUM SWALLOW WAS PERFORMED IN CONJUNCTION WITH SPEECH PATHOLOGY SERVICES 05/23/2024 02:05:24 PM TECHNIQUE: Different consistencies of barium were administered orally to the patient by the Speech Pathologist. Imaging of the pharynx was performed in the lateral projection. FLUOROSCOPY DOSE AND TYPE: Radiation Dose Index: Reference Air Kerma (in mGy) = 10.8 COMPARISON: None available CLINICAL HISTORY: dysphagia . Cancer of the mouth with prior radiation therapy and surgery. FINDINGS: Oral phase of swallowing was grossly within normal limits. Cervical spondylosis. Vallecular and piriform retention with all consistencies. Poor epiglottic inversion  with multiple episodes of laryngeal penetration with thin liquid to the level of the cords. 13 mm barium tablet initially stuck in the vallecula but cleared with a pure consistency bolus and passed into the stomach. IMPRESSION: 1. Poor epiglottic inversion with multiple episodes of laryngeal penetration with thin liquid to the level of the cords. 2. Vallecular and piriform retention with all consistencies. 3. 13 mm barium tablet initially stuck in the vallecula but cleared with a pure consistency bolus and passed into the stomach. NOTE: Please see separate speech pathology report for full discussion of findings and recommendations. Electronically signed by: Ryan Salvage MD 05/23/2024 02:16 PM EST RP Workstation: [id number]    Past medical hx Past Medical History:  Diagnosis Date   Aortic atherosclerosis    CAD (coronary artery disease)    Mild non-obstructive disease by cath January 2011. Coronary CTA showed  mild (25-49%) plaque in the RCA and minimal (<25%) coronary CTA 04/2021 with coronary Ca score 257.  Cor Ca score 448 with 25-49% RCA and <25% pLCx and pLAD by coronary CTA 03/2022   Carotid artery disease  1 to 39% right ICA stenosis and 40 to 59% left ICA stenosis by Dopplers 02/2022   COPD (chronic obstructive pulmonary disease) (HCC)    Emphysema of lung (HCC)    GERD (gastroesophageal reflux disease)    Headache    Hyperlipidemia    Hypertension    Hypothyroidism    Squamous cell carcinoma of mouth (HCC)    s/p resection and XRT and chemo     Social History   Tobacco Use   Smoking status: Former    Current packs/day: 0.00    Average packs/day: 1 pack/day for 40.0 years (40.0 ttl pk-yrs)    Types: Cigarettes    Start date: 05/11/1969    Quit date: 11/08/2020    Years since quitting: 3.5   Smokeless tobacco: Never  Vaping Use   Vaping status: Never Used  Substance Use Topics   Alcohol use: Not Currently    Comment: Recovering alcoholic   Drug use: Never    Tammy Brady  reports that she quit smoking about 3 years ago. Her smoking use included cigarettes. She started smoking about 55 years ago. She has a 40 pack-year smoking history. She has never used smokeless tobacco. She reports that she does not currently use alcohol. She reports that she does not use drugs.  Tobacco Cessation: Former smoker, quit smoking 3 years ago. She had a 40 pack year smoking history.   Past surgical hx, Family hx, Social hx all reviewed.  Current Outpatient Medications on File Prior to Visit  Medication Sig   acetaminophen  (TYLENOL ) 500 MG tablet Take 500 mg by mouth every 6 (six) hours as needed for mild pain.   albuterol  (VENTOLIN  HFA) 108 (90 Base) MCG/ACT inhaler Inhale 2 puffs into the lungs every 6 (six) hours as needed for wheezing or shortness of breath.   aspirin  EC 81 MG tablet Take 1 tablet (81 mg total) by mouth daily. Swallow whole.   diltiazem  (CARDIZEM  CD) 240 MG 24 hr capsule Take 1 capsule (240 mg total) by mouth daily.   Fluticasone -Umeclidin-Vilant (TRELEGY ELLIPTA ) 100-62.5-25 MCG/ACT AEPB Take 1 Inhalation by mouth daily.   levothyroxine  (SYNTHROID ) 88 MCG tablet Take 1 tablet (88 mcg total) by mouth daily before breakfast.   nitroGLYCERIN  (NITROSTAT ) 0.4 MG SL tablet Place 1 tablet (0.4 mg total) under the tongue every 5 (five) minutes as needed.   simvastatin  (ZOCOR ) 40 MG tablet TAKE 1 TABLET BY MOUTH AT  BEDTIME   No current facility-administered medications on file prior to visit.     Allergies  Allergen Reactions   Codeine Other (See Comments)    Headache  HEADACHE   Oxycodone      Caused headaches.     Review Of Systems:  Constitutional:   No  weight loss, night sweats,  Fevers, chills, fatigue, or  lassitude.  HEENT:   No headaches,  Difficulty swallowing,  Tooth/dental problems, or  Sore throat,                No sneezing, itching, ear ache, nasal congestion, post nasal drip,   CV:  No chest pain,  Orthopnea, PND, swelling in lower  extremities, anasarca, dizziness, palpitations, syncope.   GI  No heartburn, indigestion, abdominal pain, nausea, vomiting, diarrhea, change in bowel habits, loss of appetite, bloody stools.   Resp: + shortness of breath with exertion not  at rest.  No excess mucus, no productive cough,  No non-productive cough,  No coughing up of blood.  No change in color of mucus.  No wheezing.  No chest wall deformity  Skin: no rash or lesions.  GU: no dysuria, change in color of urine, no urgency or frequency.  No flank pain, no hematuria   MS:  No joint pain or swelling.  No decreased range of motion.  No back pain.  Psych:  No change in mood or affect. No depression or anxiety.  No memory loss.   Vital Signs BP 110/70   Pulse 61   Ht 5' 5 (1.651 m)   Wt 116 lb 3.2 oz (52.7 kg)   SpO2 100%   BMI 19.34 kg/m    Physical Exam:  General- No distress,  A&Ox3, appropriate ENT: No sinus tenderness, TM clear, pale nasal mucosa, no oral exudate,no post nasal drip, no LAN, previous surgical scars apparent to mouth/ left side of face Cardiac: S1, S2, regular rate and rhythm, no murmur Chest: No wheeze/ rales/ dullness; no accessory muscle use, no nasal flaring, no sternal retractions Abd.: Soft Non-tender, ND, BS +, Body mass index is 19.34 kg/m.  Ext: No clubbing cyanosis, edema, no obvious deformities Neuro:  normal strength, MAE x 4, A&O x 3, appropriate Skin: No rashes, warm and dry, No obvious skin lesions  Psych: normal mood and behavior  Physical Exam    Assessment/Plan Post bronchoscopy with biopsies History of oral squamous cell carcinoma  Former smoker  Biopsies negative for malignancy, but + for an unidentified fungal element. Plan Biopsies negative for malignancy, but + for a fungal element, Fungal cultures are negative  Pathophysiology to do additional testing to identify fungal element. We will call you once we get additional results. If this is a fungus, we will refer you  to Infectious Disease for treatment. 3 month follow up CT Chest die 09/2024 We will review these results  with Dr. Shelah to ensure he is in agreement with plan.   I spent 25 minutes dedicated to the care of this patient on the date of this encounter to include pre-visit review of records, face-to-face time with the patient discussing conditions above, post visit ordering of testing, clinical documentation with the electronic health record, making appropriate referrals as documented, and communicating necessary information to the patient's healthcare team.   Assessment & Plan   Lauraine JULIANNA Lites, NP 06/10/2024  12:02 PM

## 2024-06-11 LAB — CYTOLOGY - NON PAP

## 2024-06-13 ENCOUNTER — Ambulatory Visit: Payer: Self-pay | Admitting: Physician Assistant

## 2024-06-13 ENCOUNTER — Ambulatory Visit (HOSPITAL_COMMUNITY)
Admission: RE | Admit: 2024-06-13 | Discharge: 2024-06-13 | Disposition: A | Source: Ambulatory Visit | Attending: Physician Assistant | Admitting: Physician Assistant

## 2024-06-13 DIAGNOSIS — R1313 Dysphagia, pharyngeal phase: Secondary | ICD-10-CM | POA: Insufficient documentation

## 2024-06-13 DIAGNOSIS — R634 Abnormal weight loss: Secondary | ICD-10-CM | POA: Diagnosis present

## 2024-06-14 ENCOUNTER — Telehealth: Payer: Self-pay

## 2024-06-14 ENCOUNTER — Telehealth: Payer: Self-pay | Admitting: Acute Care

## 2024-06-14 NOTE — Telephone Encounter (Signed)
 Please advise

## 2024-06-14 NOTE — Telephone Encounter (Signed)
 Copied from CRM #[id number]. Topic: Clinical - Lab/Test Results >> Jun 13, 2024 12:52 PM Rozanna G wrote: Reason for CRM: pt calling to check on the next steps from the labs and visit she had on 12/01 with Lauraine Lites and she can be reached at  [phone].   Taken care of in Mychart message

## 2024-06-14 NOTE — Telephone Encounter (Signed)
 I have called the patient and explained that the pathologist did additional stains on the tissue sample, and this was positive for Aspergillus. Invasive aspergillus is treated with Voriconazole  , and if that is the route we take, I will refer the patient to ID for treatment. I have messaged Dr. Shelah, as I want to include him in the decision making process. I told the patient I will call her once I have heard back from Dr. Shelah.She verbalized understanding.

## 2024-06-17 ENCOUNTER — Telehealth: Payer: Self-pay | Admitting: Acute Care

## 2024-06-17 DIAGNOSIS — B449 Aspergillosis, unspecified: Secondary | ICD-10-CM

## 2024-06-17 NOTE — Telephone Encounter (Signed)
 I agree with that plan. We will reimage after voriconazole to see if there is interval resolution.

## 2024-06-17 NOTE — Telephone Encounter (Signed)
 I have called the patient and let her know Dr. Shelah is in agreement with referral to ID for treatment of Invasive aspergillus I have placed the referral to ID and patient is aware. 3 month follow up CT Chest is ordered. Pt. Will follow up with me after that follow up scan.

## 2024-06-28 LAB — FUNGAL ORGANISM REFLEX

## 2024-06-28 LAB — FUNGUS CULTURE WITH STAIN

## 2024-06-28 LAB — FUNGUS CULTURE RESULT

## 2024-07-01 ENCOUNTER — Other Ambulatory Visit: Payer: Self-pay

## 2024-07-01 DIAGNOSIS — I10 Essential (primary) hypertension: Secondary | ICD-10-CM

## 2024-07-02 ENCOUNTER — Other Ambulatory Visit: Payer: Self-pay

## 2024-07-02 ENCOUNTER — Telehealth: Payer: Self-pay

## 2024-07-02 ENCOUNTER — Encounter: Payer: Self-pay | Admitting: Infectious Diseases

## 2024-07-02 ENCOUNTER — Ambulatory Visit (INDEPENDENT_AMBULATORY_CARE_PROVIDER_SITE_OTHER): Admitting: Infectious Diseases

## 2024-07-02 VITALS — BP 133/59 | HR 58 | Temp 97.6°F | Ht 65.0 in | Wt 117.0 lb

## 2024-07-02 DIAGNOSIS — B449 Aspergillosis, unspecified: Secondary | ICD-10-CM

## 2024-07-02 DIAGNOSIS — Z79899 Other long term (current) drug therapy: Secondary | ICD-10-CM | POA: Diagnosis not present

## 2024-07-02 MED ORDER — VORICONAZOLE 200 MG PO TABS: 200.0000 mg | ORAL_TABLET | Freq: Two times a day (BID) | ORAL | 0 refills | Status: AC

## 2024-07-02 MED ORDER — VORICONAZOLE 50 MG PO TABS: 100.0000 mg | ORAL_TABLET | Freq: Two times a day (BID) | ORAL | 0 refills | Status: AC

## 2024-07-02 NOTE — Progress Notes (Addendum)
 "  Referring Provider: Lauraine Lites NP Reason for Referral: possible aspergillosis   Patient Active Problem List   Diagnosis Date Noted   Lung nodule 05/20/2024   Menopausal and perimenopausal disorder 09/25/2023   Dizziness 03/02/2023   Malaise 02/28/2022   Hematuria, microscopic 02/28/2022   Tick bite of abdomen 02/28/2022   Weight loss 02/07/2022   New daily persistent headache 01/19/2022   Vitamin D  insufficiency 12/20/2021   Centrilobular emphysema (HCC) 12/20/2021   Carcinoma of lower gingiva (HCC) 09/14/2021   History of radiation to head and neck region 06/25/2021   Encounter for dental examination 06/25/2021   Xerostomia due to radiotherapy 06/25/2021   Dysgeusia 06/25/2021   Trismus 06/25/2021   Encounter for general adult medical examination with abnormal findings 06/06/2021   Encounter for osteoporosis screening in asymptomatic postmenopausal patient 06/06/2021   Visit for screening mammogram 06/06/2021   Need for immunization against influenza 06/06/2021   Symptomatic anemia 05/18/2021   CAP (community acquired pneumonia) 05/17/2021   Normocytic anemia 05/17/2021   Moderate protein-calorie malnutrition 05/17/2021   Hyponatremia 05/17/2021   Aortic atherosclerosis 04/25/2021   Chest pain of uncertain etiology 04/07/2021   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 chemotherapy 04/07/2021   Mucositis due to antineoplastic therapy 03/31/2021   Drug induced constipation 03/31/2021   Chemotherapy-induced nausea 03/31/2021   Port-A-Cath in place 03/09/2021   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   Carcinoma of lower gum (HCC) 02/20/2021   Squamous cell carcinoma of mouth (HCC) 12/08/2020   Gastritis 12/08/2020   Anxiety 12/08/2020   Need for prophylactic vaccination and inoculation against influenza 07/30/2020   Depression 11/26/2015   GERD (gastroesophageal reflux disease) 11/26/2015   Migraine headache 11/26/2015   Screening for diabetes mellitus  09/03/2015   Hypothyroidism 07/22/2009   Mixed hyperlipidemia 07/22/2009   Hypertension, essential 07/22/2009   CAD, NATIVE VESSEL 07/22/2009   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   Coronary artery spasm 07/22/2009    Patient's Medications  New Prescriptions   No medications on file  Previous Medications   ACETAMINOPHEN  (TYLENOL ) 500 MG TABLET    Take 500 mg by mouth every 6 (six) hours as needed for mild pain.   ALBUTEROL  (VENTOLIN  HFA) 108 (90 BASE) MCG/ACT INHALER    Inhale 2 puffs into the lungs every 6 (six) hours as needed for wheezing or shortness of breath.   ASPIRIN  EC 81 MG TABLET    Take 1 tablet (81 mg total) by mouth daily. Swallow whole.   DILTIAZEM  (CARDIZEM  CD) 240 MG 24 HR CAPSULE    TAKE 1 CAPSULE BY MOUTH ONCE DAILY   FLUTICASONE -UMECLIDIN-VILANT (TRELEGY ELLIPTA ) 100-62.5-25 MCG/ACT AEPB    Take 1 Inhalation by mouth daily.   LEVOTHYROXINE  (SYNTHROID ) 88 MCG TABLET    Take 1 tablet (88 mcg total) by mouth daily before breakfast.   NITROGLYCERIN  (NITROSTAT ) 0.4 MG SL TABLET    Place 1 tablet (0.4 mg total) under the tongue every 5 (five) minutes as needed.   SIMVASTATIN  (ZOCOR ) 40 MG TABLET    TAKE 1 TABLET BY MOUTH AT  BEDTIME  Modified Medications   No medications on file  Discontinued Medications   No medications on file    Subjective:  Discussed the use of AI scribe software for clinical note transcription with the patient, who gave verbal consent to proceed.   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  She was found to have a pulmonary nodule in her routine annual lung cancer screening with CT leading to subsequent PET and bronchoscopy/biopsy. She was told the biopsy was negative for cancer pathology  concerning for Aspergillus.  She has COPD and emphysema from long-term smoking, quit in May 2022. She has baseline exertional shortness of breath and uses Trelegy inhaler. Minimal cough, chest pain.  Denies fever and chill.  denies nausea, vomiting or diarrhea, rashes or GU symptoms or joint pain.  Denies headache, blurry vision or neck pain. she denies frequent exacerbations requiring repeated prednisone  courses, nor has used prednisone  recently.  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 She is in remission without known recurrence.  She is a recovering alcoholic and quit drinking in May 2022. She has difficulty chewing and swallowing and struggles to maintain weight. She had significant loss of weight after her diagnosis of squamous cell carcinoma of her mouth.   She lives with husband.  She was born in [place]  and moved to [place] , [place] and now in [place].  Lives with dog and cats. Used to work in audiological scientist.  She believes the Aspergillus exposure may have occurred while digging in rotting mulch and topsoil without a mask in late spring last year, with exposure to mushroom spores and animal feces.  Review of Systems: All systems reviewed with pertinent positive and negative as listed above.   Past Medical History:  Diagnosis Date   Aortic atherosclerosis    CAD (coronary artery disease)    Mild non-obstructive disease by cath January 2011. Coronary CTA showed  mild (25-49%) plaque in the RCA and minimal (<25%) coronary CTA 04/2021 with coronary Ca score 257.  Cor Ca score 448 with 25-49% RCA and <25% pLCx and pLAD by coronary CTA 03/2022   Carotid artery disease    1 to 39% right ICA stenosis and 40 to 59% left ICA stenosis by Dopplers 02/2022   COPD (chronic obstructive pulmonary disease) (HCC)    Emphysema of  lung (HCC)    GERD (gastroesophageal reflux disease)    Headache    Hyperlipidemia    Hypertension    Hypothyroidism    Squamous cell carcinoma of mouth (HCC)    s/p resection and XRT and chemo   Past Surgical History:  Procedure Laterality Date   APPENDECTOMY  Feb 2015   BRONCHIAL NEEDLE ASPIRATION BIOPSY  05/28/2024   Procedure: BRONCHOSCOPY, WITH NEEDLE ASPIRATION BIOPSY;  Surgeon: Shelah Lamar RAMAN, MD;  Location: [facility] ENDOSCOPY;  Service: Pulmonary;;   CRYOTHERAPY  05/28/2024   Procedure: CRYOTHERAPY;  Surgeon: Shelah Lamar RAMAN, MD;  Location: [facility] ENDOSCOPY;  Service: Pulmonary;;   EXCISION ORAL TUMOR  01/18/2021   see details [facility] Everywhere   FUDUCIAL PLACEMENT  05/28/2024   Procedure: INSERTION, FIDUCIAL MARKER, GOLD;  Surgeon: Shelah Lamar RAMAN, MD;  Location: [facility] ENDOSCOPY;  Service: Pulmonary;;   IR IMAGING GUIDED PORT INSERTION  03/02/2021   IR REMOVAL TUN ACCESS W/ PORT W/O FL MOD SED  08/23/2022   TONSILLECTOMY     TUBAL LIGATION     VIDEO BRONCHOSCOPY WITH ENDOBRONCHIAL NAVIGATION Right 05/28/2024   Procedure: VIDEO BRONCHOSCOPY WITH ENDOBRONCHIAL NAVIGATION;  Surgeon: Shelah Lamar RAMAN, MD;  Location: [facility] ENDOSCOPY;  Service: Pulmonary;  Laterality: Right;  Potential difficult intubation, hx of  oral squamous cell cancer s/p surgery, chemo/radiation     Social History[1]  Family History  Problem Relation Age of Onset   Cancer Mother        esophageal   Heart disease Father    Coronary artery disease Father    Alcohol abuse Father    COPD Father    Hyperlipidemia Sister    Obesity Sister    Hyperlipidemia Sister    Hyperlipidemia Brother    Hyperlipidemia Brother    Hyperlipidemia Brother    Coronary artery disease Maternal Grandmother    Alcohol abuse Sister     Allergies[2]  Health Maintenance  Topic Date Due   Mammogram  10/02/2024   Lung Cancer Screening  04/10/2025   Medicare Annual Wellness (AWV)  05/31/2025   Bone Density Scan  10/18/2025    Colonoscopy  04/09/2028   Pneumococcal Vaccine: 50+ Years  Completed   Influenza Vaccine  Completed   Meningococcal B Vaccine  Aged Out   DTaP/Tdap/Td  Discontinued   COVID-19 Vaccine  Discontinued   Hepatitis C Screening  Discontinued   Zoster Vaccines- Shingrix  Discontinued    Objective: BP (!) 133/59   Pulse (!) 58   Temp 97.6 F (36.4 C) (Temporal)   Ht 5' 5 (1.651 m)   Wt 117 lb (53.1 kg)   SpO2 98%   BMI 19.47 kg/m    Physical Exam Constitutional:      Appearance: Normal appearance. Thin looking HENT:     Head: Normocephalic and atraumatic.      Mouth: Mucous membranes are moist.  Eyes:    Conjunctiva/sclera: Conjunctivae normal.     Pupils: Pupils are equal, round, and b/l symmetrical    Cardiovascular:     Rate and Rhythm: Normal rate     Heart sounds: S1S2  Pulmonary:     Effort: Pulmonary effort is normal.     Breath sounds: Normal breath sounds.   Abdominal:     General: Non distended     Palpations: soft.   Musculoskeletal:        General: Normal range of motion.   Skin:    General: Skin is warm and dry.     Comments:  Neurological:     General: grossly non focal     Mental Status: awake, alert and oriented  Psychiatric:        Mood and Affect: Mood normal.   Lab Results Lab Results  Component Value Date   WBC 5.3 05/28/2024   HGB 12.5 05/28/2024   HCT 38.0 05/28/2024   MCV 96.2 05/28/2024   PLT 221 05/28/2024    Lab Results  Component Value Date   CREATININE 0.73 05/28/2024   BUN 18 05/28/2024   NA 143 05/28/2024   K 4.4 05/28/2024   CL 104 05/28/2024   CO2 24 05/28/2024    Lab Results  Component Value Date   ALT 13 03/27/2024   AST 25 03/27/2024   ALKPHOS 60 03/27/2024   BILITOT 0.3 03/27/2024    Lab Results  Component Value Date   CHOL 155 03/27/2024   HDL 81 03/27/2024   LDLCALC 61 03/27/2024   TRIG 64 03/27/2024   CHOLHDL 1.9 03/27/2024   No results found for: LABRPR, RPRTITER No results found for:  HIV1RNAQUANT, HIV1RNAVL, CD4TABS   Microbiology Results for orders placed or performed during the hospital encounter of 05/28/24  Fungus Culture With Stain     Status: None   Collection Time: 05/28/24  8:28 AM  Specimen: Bronchial Alveolar Lavage; Respiratory  Result Value Ref Range Status   Fungus Stain Final report  Final   Fungus (Mycology) Culture Final report  Final    Comment: (NOTE) Performed At: [facility] [address] [place], [place] [id number] Jennette Shorter MD Ey:[id number]    Fungal Source BRONCHIAL ALVEOLAR LAVAGE  Final    Comment: Performed at [facility], [address] [place], [place] [address]  Aerobic/Anaerobic Culture w Gram Stain (surgical/deep wound)     Status: None   Collection Time: 05/28/24  8:28 AM   Specimen: Bronchial Alveolar Lavage; Respiratory  Result Value Ref Range Status   Specimen Description BRONCHIAL ALVEOLAR LAVAGE  Final   Special Requests NONE  Final   Gram Stain NO WBC SEEN NO ORGANISMS SEEN   Final   Culture   Final    No growth aerobically or anaerobically. Performed at [facility], [address], [place], [place] [address]    Report Status 06/02/2024 FINAL  Final  Acid Fast Smear (AFB)     Status: None   Collection Time: 05/28/24  8:28 AM   Specimen: Bronchial Alveolar Lavage; Respiratory  Result Value Ref Range Status   AFB Specimen Processing Concentration  Final   Acid Fast Smear Negative  Final    Comment: (NOTE) Performed At: [facility] [address] [place], [place] [id number] Jennette Shorter MD Ey:[id number]    Source (AFB) BRONCHIAL ALVEOLAR LAVAGE  Final    Comment: Performed at [facility], [address], [place], [place] [address]  Fungus Culture Result     Status: None   Collection Time: 05/28/24  8:28 AM  Result Value Ref Range Status   Result 1 Comment  Final    Comment: (NOTE) KOH/Calcofluor preparation:  no fungus observed. Performed At: [facility] [address] [place], [place] [id number] Jennette Shorter MD Ey:[id number]   Fungal organism reflex     Status: None   Collection Time: 05/28/24  8:28 AM  Result Value Ref Range Status   Fungal result 1 Comment  Final    Comment: (NOTE) No yeast or mold isolated after 4 weeks. Performed At: [facility] [address] [place], [place] [id number] Jennette Shorter MD Ey:[id number]    Pathology  FINAL MICROSCOPIC DIAGNOSIS:  - No malignant cells identified   SPECIMEN ADEQUACY:  Satisfactory for evaluation ____ FINAL MICROSCOPIC DIAGNOSIS:  A. LUNG, RUL, FINE NEEDLE ASPIRATION:  - No malignant cells identified  - Fungal organisms present   SPECIMEN ADEQUACY:  A. Satisfactory for Evaluation    ADDENDUM:   Per clinical request, GMS stain was performed. The stain highlights  fungal organisms morphologically indicative of aspergillus. Correlation  with microbiologic test is recommended.   Imaging DG ESOPHAGUS W DOUBLE CM (HD) Result Date: 06/13/2024 EXAM: Double Contrast Esophagram 06/13/2024 09:00:33 AM TECHNIQUE: Double contrast esophagram was performed with barium and air contrast. FLUOROSCOPY DOSE AND TYPE: Radiation Dose Index: Reference Air Kerma (in mGy) = 3.0 COMPARISON: None available CLINICAL HISTORY: dysphagia FINDINGS: The esophagus is of normal caliber. No mass or stricture is noted. The esophagus demonstrates mild dysmotility with mild posterior contractions noted in the distal esophagus. A 13 mm barium tablet passed through the esophagus and to stomach without difficulty or delay. There are no mucosal abnormalities seen. The gastroesophageal junction is normal. No definite hiatal hernia or reflux is noted. IMPRESSION: 1. Mild distal esophageal dysmotility. Electronically signed by: Lynwood Seip MD 06/13/2024 09:27 AM EST  RP Workstation: [id number]   04/29/24 IMPRESSION: 1. Hypermetabolic right upper lobe nodule is most likely due to stage IA primary  bronchogenic carcinoma. Difficult to definitively exclude a metastasis in this patient with a history oropharyngeal cancer. 2. Focal hypermetabolism in the right hilum. A metastatic lymph node cannot be excluded. 3. Mildly hypermetabolic clustered nodularity in the right lower lobe, likely infectious in etiology. Recommend attention on follow-up. 4. Possible ventral bladder wall thickening. Please correlate clinically and consider CT abdomen pelvis without and with contrast, as clinically indicated. 5. Aortic atherosclerosis (ICD10-I70.0). Coronary artery calcification.  04/10/24 IMPRESSION: 1. Lung-RADS 4A, suspicious. Follow up low-dose chest CT without contrast in 3 months (please use the following order, CT CHEST LCS NODULE FOLLOW-UP W/O CM) is recommended. Alternatively, PET may be considered when there is a solid component 8 mm or larger. New spiculated solid peripheral right upper lobe pulmonary nodule measuring 10.3 mm in volume derived mean diameter, suspicious for primary bronchogenic carcinoma. 2. Two-vessel coronary atherosclerosis. 3. Aortic Atherosclerosis (ICD10-I70.0) and Emphysema (ICD10-J43.9).   Assessment/Plan # Possible invasive pulmonary aspergillosis # Spiculated solid peripheral right upper lobe pulmonary nodule measuring 10.3 mm in volume +  solid right lower lobe pulmonary nodules up to 3.6 mm posteriorly  - Risk factor: History of emphysema/COPD but denies need for frequent steroids.  No other risk factors - 05/18/21 HIV NR - 05/28/24 Bronchoscopy AFB negative, cx pending fungal stain and cx negative, aerobic/anaerobic cx NG  Plan  - Voriconazole  300 mg p.o. twice daily then 20 mg p.o. bid thereafter - DDIS reviewed, have instructed patient to contact PCP to switch simvastatin  to atorvastatin - Side effects of voriconazole  reviewed  like visual disturbances, hepatotoxicity, GI side effects, electrolyte disturbances, Cardiac effects, rashes etc  - CBC,  CMP, aspergillus ag in serum  - fu in 1 week for lab visit for voriconazole  level - fu in 3 weeks    # Emphysema/COPD - fu with Pulmonary   # EX smoker/Ex alcohol user - congratulated on staying clean  I personally spent a total of 63 minutes in the care of the patient today including preparing to see the patient, getting/reviewing separately obtained history, performing a medically appropriate exam/evaluation, counseling and educating, placing orders, documenting clinical information in the EHR, independently interpreting results, communicating results, and coordinating care.   Of note, portions of this note may have been created with voice recognition software. While this note has been edited for accuracy, occasional wrong-word or sound-a-like substitutions may have occurred due to the inherent limitations of voice recognition software.   Annalee Joseph, MD [facility] 07/02/2024, 12:56 PM     [1]  Social History Tobacco Use   Smoking status: Former    Current packs/day: 0.00    Average packs/day: 1 pack/day for 40.0 years (40.0 ttl pk-yrs)    Types: Cigarettes    Start date: 05/11/1969    Quit date: 11/08/2020    Years since quitting: 3.6   Smokeless tobacco: Never  Vaping Use   Vaping status: Never Used  Substance Use Topics   Alcohol use: Not Currently    Comment: Recovering alcoholic   Drug use: Never  [2]  Allergies Allergen Reactions   Codeine Other (See Comments)    Headache  HEADACHE   Oxycodone      Caused headaches.    "

## 2024-07-02 NOTE — Telephone Encounter (Signed)
 Copied from CRM #[id number]. Topic: Clinical - Medical Advice >> Jul 02, 2024  2:50 PM Tammy Brady wrote: Reason for CRM: Infectious Disease provider placed patient on an antifungal medication which is contraindicated with the simvastatin . Advised patient be prescribed something other than a statin. Patient CB# [phone]

## 2024-07-02 NOTE — Telephone Encounter (Signed)
 Please call patient - see what medication it is --- is this for short term or long term

## 2024-07-04 MED ORDER — VORICONAZOLE 50 MG PO TABS: 100.0000 mg | ORAL_TABLET | Freq: Two times a day (BID) | ORAL | 0 refills | Status: AC

## 2024-07-05 ENCOUNTER — Ambulatory Visit: Payer: Self-pay | Admitting: Infectious Diseases

## 2024-07-05 ENCOUNTER — Encounter: Payer: Self-pay | Admitting: Physician Assistant

## 2024-07-06 LAB — COMPREHENSIVE METABOLIC PANEL WITH GFR
AG Ratio: 2.1 (calc) (ref 1.0–2.5)
ALT: 11 U/L (ref 6–29)
AST: 21 U/L (ref 10–35)
Albumin: 4.2 g/dL (ref 3.6–5.1)
Alkaline phosphatase (APISO): 57 U/L (ref 37–153)
BUN: 18 mg/dL (ref 7–25)
CO2: 31 mmol/L (ref 20–32)
Calcium: 9.3 mg/dL (ref 8.6–10.4)
Chloride: 105 mmol/L (ref 98–110)
Creat: 0.71 mg/dL (ref 0.60–1.00)
Globulin: 2 g/dL (ref 1.9–3.7)
Glucose, Bld: 103 mg/dL — ABNORMAL HIGH (ref 65–99)
Potassium: 4.7 mmol/L (ref 3.5–5.3)
Sodium: 143 mmol/L (ref 135–146)
Total Bilirubin: 0.2 mg/dL (ref 0.2–1.2)
Total Protein: 6.2 g/dL (ref 6.1–8.1)
eGFR: 91 mL/min/1.73m2

## 2024-07-06 LAB — ASPERGILLUS ANTIGEN,SERUM
Aspergillus Ag, EIA: NOT DETECTED
Index Value: 0.03

## 2024-07-06 LAB — CBC
HCT: 40.1 % (ref 35.9–46.0)
Hemoglobin: 12.8 g/dL (ref 11.7–15.5)
MCH: 30.6 pg (ref 27.0–33.0)
MCHC: 31.9 g/dL (ref 31.6–35.4)
MCV: 95.9 fL (ref 81.4–101.7)
MPV: 9.5 fL (ref 7.5–12.5)
Platelets: 220 Thousand/uL (ref 140–400)
RBC: 4.18 Million/uL (ref 3.80–5.10)
RDW: 12 % (ref 11.0–15.0)
WBC: 6.2 Thousand/uL (ref 3.8–10.8)

## 2024-07-08 NOTE — Telephone Encounter (Signed)
"  Left detailed message for patient to return call.  "

## 2024-07-08 NOTE — Telephone Encounter (Signed)
 Patient has replied through my chart.

## 2024-07-09 ENCOUNTER — Other Ambulatory Visit: Payer: Self-pay

## 2024-07-09 DIAGNOSIS — Z79899 Other long term (current) drug therapy: Secondary | ICD-10-CM

## 2024-07-11 LAB — VORICONAZOLE QUANT BY LC/MS: Voriconazole, Quant, by LC/MS: 1.5 ug/mL

## 2024-07-13 LAB — ACID FAST CULTURE WITH REFLEXED SENSITIVITIES (MYCOBACTERIA): Acid Fast Culture: NEGATIVE

## 2024-07-15 ENCOUNTER — Encounter: Payer: Self-pay | Admitting: Hematology and Oncology

## 2024-07-17 ENCOUNTER — Encounter: Payer: Self-pay | Admitting: Hematology and Oncology

## 2024-07-25 ENCOUNTER — Inpatient Hospital Stay: Payer: Medicare Other | Attending: Hematology and Oncology | Admitting: Hematology and Oncology

## 2024-07-25 ENCOUNTER — Other Ambulatory Visit: Payer: Self-pay

## 2024-07-25 ENCOUNTER — Encounter: Payer: Self-pay | Admitting: Infectious Diseases

## 2024-07-25 ENCOUNTER — Ambulatory Visit: Payer: Self-pay | Admitting: Infectious Diseases

## 2024-07-25 VITALS — BP 117/49 | HR 61 | Temp 97.2°F | Resp 17 | Wt 116.2 lb

## 2024-07-25 VITALS — BP 146/66 | HR 67 | Temp 97.6°F | Wt 114.0 lb

## 2024-07-25 DIAGNOSIS — B441 Other pulmonary aspergillosis: Secondary | ICD-10-CM | POA: Insufficient documentation

## 2024-07-25 DIAGNOSIS — Z87891 Personal history of nicotine dependence: Secondary | ICD-10-CM | POA: Insufficient documentation

## 2024-07-25 DIAGNOSIS — E039 Hypothyroidism, unspecified: Secondary | ICD-10-CM | POA: Diagnosis not present

## 2024-07-25 DIAGNOSIS — Z800 Family history of malignant neoplasm of digestive organs: Secondary | ICD-10-CM | POA: Insufficient documentation

## 2024-07-25 DIAGNOSIS — Z79890 Hormone replacement therapy (postmenopausal): Secondary | ICD-10-CM | POA: Insufficient documentation

## 2024-07-25 DIAGNOSIS — F17200 Nicotine dependence, unspecified, uncomplicated: Secondary | ICD-10-CM | POA: Insufficient documentation

## 2024-07-25 DIAGNOSIS — I10 Essential (primary) hypertension: Secondary | ICD-10-CM | POA: Insufficient documentation

## 2024-07-25 DIAGNOSIS — Z9221 Personal history of antineoplastic chemotherapy: Secondary | ICD-10-CM | POA: Diagnosis not present

## 2024-07-25 DIAGNOSIS — C069 Malignant neoplasm of mouth, unspecified: Secondary | ICD-10-CM | POA: Diagnosis not present

## 2024-07-25 DIAGNOSIS — R911 Solitary pulmonary nodule: Secondary | ICD-10-CM | POA: Diagnosis not present

## 2024-07-25 DIAGNOSIS — Z923 Personal history of irradiation: Secondary | ICD-10-CM | POA: Insufficient documentation

## 2024-07-25 DIAGNOSIS — C060 Malignant neoplasm of cheek mucosa: Secondary | ICD-10-CM | POA: Insufficient documentation

## 2024-07-25 DIAGNOSIS — J439 Emphysema, unspecified: Secondary | ICD-10-CM | POA: Diagnosis not present

## 2024-07-25 DIAGNOSIS — Z5181 Encounter for therapeutic drug level monitoring: Secondary | ICD-10-CM | POA: Insufficient documentation

## 2024-07-25 DIAGNOSIS — B449 Aspergillosis, unspecified: Secondary | ICD-10-CM

## 2024-07-25 MED ORDER — VORICONAZOLE 200 MG PO TABS: 200.0000 mg | ORAL_TABLET | Freq: Two times a day (BID) | ORAL | 0 refills | Status: AC

## 2024-07-25 NOTE — Progress Notes (Signed)
 [facility] Cancer Follow up:    Tammy Credit, PA-C 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  Her pathologic staging is pT3 N3B.   02/19/2021 Cancer Staging   Staging form: Oral Cavity, AJCC 8th Edition - Pathologic stage from 02/19/2021: Stage IVB (pT3, pN3b, cM0) - Signed by Izell Domino, MD on 02/20/2021 Stage prefix: Initial diagnosis   03/08/2021 - 04/19/2021 Radiation Therapy   Radiation therapy given concurrently with weekly Cisplatin    03/10/2021 - 04/15/2021 Chemotherapy   Patient is on Treatment Plan :  HEAD/NECK Cisplatin  q7d  x 6 cycles     CURRENT THERAPY: s/p chemoradiation  INTERVAL HISTORY:  Tammy Brady 74 y.o. female returns for evaluation and follow-up of her head neck cancer.  Discussed the use of AI scribe software for clinical note transcription with the patient, who gave verbal consent to proceed.  History of Present Illness Tammy Brady is a 74 year old female with a history of treated head and neck cancer who presents for oncology follow-up after diagnosis of pulmonary aspergillosis.  She underwent annual chest CT imaging which identified a new right upper lobe spiculated solid nodule not present on prior studies. PET  scan demonstrated a hypermetabolic lesion. In November 2025, biopsy of the nodule revealed no malignant cells but showed fungal organisms consistent with Aspergillus infection. She is currently receiving voriconazole  therapy for a fungal infection. She denies pain or respiratory complaints and reports feeling generally well.  She remains under routine surveillance for her prior head and neck cancer, with her most recent ENT evaluation in May 2025 including a scope. She reports no new symptoms or concerns related to her prior malignancy.  She expressed concern regarding a recent episode of elevated blood pressure, questioning whether voriconazole  could be contributory. She has not experienced other new symptoms or adverse effects related to her current antifungal therapy.    Patient Active Problem List   Diagnosis Date Noted   Medication monitoring encounter 07/25/2024   Therapeutic drug monitoring 07/25/2024   Invasive aspergillosis (HCC) 07/02/2024   Medication management 07/02/2024   Lung nodule 05/20/2024   Menopausal and perimenopausal disorder 09/25/2023   Dizziness 03/02/2023   Malaise 02/28/2022   Hematuria, microscopic 02/28/2022   Tick bite of abdomen 02/28/2022   Weight loss 02/07/2022   New daily persistent headache 01/19/2022   Vitamin D  insufficiency 12/20/2021   Centrilobular emphysema (HCC) 12/20/2021   Carcinoma of lower gingiva (HCC) 09/14/2021   History of radiation to head and neck region 06/25/2021   Encounter for dental examination 06/25/2021   Xerostomia due to radiotherapy 06/25/2021   Dysgeusia 06/25/2021   Trismus 06/25/2021   Encounter for general adult medical examination with abnormal findings 06/06/2021   Encounter for osteoporosis screening in asymptomatic postmenopausal patient 06/06/2021  Visit for screening mammogram 06/06/2021   Need for immunization against influenza 06/06/2021   Symptomatic anemia 05/18/2021   CAP (community acquired pneumonia)  05/17/2021   Normocytic anemia 05/17/2021   Moderate protein-calorie malnutrition 05/17/2021   Hyponatremia 05/17/2021   Aortic atherosclerosis 04/25/2021   Chest pain of uncertain etiology 04/07/2021   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 chemotherapy 04/07/2021   Mucositis due to antineoplastic therapy 03/31/2021   Drug induced constipation 03/31/2021   Chemotherapy-induced nausea 03/31/2021   Port-A-Cath in place 03/09/2021   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   Carcinoma of lower gum (HCC) 02/20/2021   Squamous cell carcinoma of mouth (HCC) 12/08/2020   Gastritis 12/08/2020   Anxiety 12/08/2020   Need for prophylactic vaccination and inoculation against influenza 07/30/2020   Depression 11/26/2015   GERD (gastroesophageal reflux disease) 11/26/2015   Migraine headache 11/26/2015   Screening for diabetes mellitus 09/03/2015   Hypothyroidism 07/22/2009   Mixed hyperlipidemia 07/22/2009   Hypertension, essential 07/22/2009   CAD, NATIVE VESSEL 07/22/2009   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   Coronary artery spasm 07/22/2009    is allergic to codeine and oxycodone .  MEDICAL HISTORY: Past Medical History:  Diagnosis Date   Aortic atherosclerosis    CAD (coronary artery disease)    Mild non-obstructive disease by cath January 2011. Coronary CTA showed  mild (25-49%) plaque in the RCA and minimal (<25%) coronary CTA 04/2021 with coronary Ca score 257.  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    SURGICAL HISTORY: Past Surgical History:   Procedure Laterality Date   APPENDECTOMY  Feb 2015   BRONCHIAL NEEDLE ASPIRATION BIOPSY  05/28/2024   Procedure: BRONCHOSCOPY, WITH NEEDLE ASPIRATION BIOPSY;  Surgeon: Shelah Lamar RAMAN, MD;  Location: [facility];  Service: Pulmonary;;   CRYOTHERAPY  05/28/2024   Procedure: CRYOTHERAPY;  Surgeon: Shelah Lamar RAMAN, MD;  Location: [facility];  Service: Pulmonary;;   EXCISION ORAL TUMOR  01/18/2021   see details [facility] Everywhere   FUDUCIAL PLACEMENT  05/28/2024   Procedure: INSERTION, FIDUCIAL MARKER, GOLD;  Surgeon: Shelah Lamar RAMAN, MD;  Location: [facility];  Service: Pulmonary;;   IR IMAGING GUIDED PORT INSERTION  03/02/2021   IR REMOVAL TUN ACCESS W/ PORT W/O FL MOD SED  08/23/2022   TONSILLECTOMY     TUBAL LIGATION     VIDEO BRONCHOSCOPY WITH ENDOBRONCHIAL NAVIGATION Right 05/28/2024   Procedure: VIDEO BRONCHOSCOPY WITH ENDOBRONCHIAL NAVIGATION;  Surgeon: Shelah Lamar RAMAN, MD;  Location: [facility];  Service: Pulmonary;  Laterality: Right;  Potential difficult intubation, hx of oral squamous cell cancer s/p surgery, chemo/radiation    SOCIAL HISTORY: Social History   Socioeconomic History   Marital status: Married    Spouse name: [name]   Number of children: 1   Years of education: Not on file   Highest education level: 12th grade  Occupational History   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  Tobacco Use   Smoking status:  Former    Current packs/day: 0.00    Average packs/day: 1 pack/day for 40.0 years (40.0 ttl pk-yrs)    Types: Cigarettes    Start date: 05/11/1969    Quit date: 11/08/2020    Years since quitting: 3.7   Smokeless tobacco: Never  Vaping Use   Vaping status: Never Used  Substance and Sexual Activity   Alcohol use: Not Currently    Comment: Recovering alcoholic   Drug use: Never   Sexual activity: Not Currently  Other Topics Concern   Not on file  Social History Narrative   Not on file   Social Drivers of Health   Tobacco Use:  Medium Risk (07/25/2024)   Patient History    Smoking Tobacco Use: Former    Smokeless Tobacco Use: Never    Passive Exposure: Not on Actuary Strain: Low Risk (05/30/2024)   Overall Financial Resource Strain (CARDIA)    Difficulty of Paying Living Expenses: Not hard at all  Food Insecurity: No Food Insecurity (05/31/2024)   Epic    Worried About Programme Researcher, Broadcasting/film/video in the Last Year: Never true    Ran Out of Food in the Last Year: Never true  Transportation Needs: No Transportation Needs (05/31/2024)   Epic    Lack of Transportation (Medical): No    Lack of Transportation (Non-Medical): No  Physical Activity: Sufficiently Active (05/31/2024)   Exercise Vital Sign    Days of Exercise per Week: 4 days    Minutes of Exercise per Session: 80 min  Recent Concern: Physical Activity - Insufficiently Active (05/30/2024)   Exercise Vital Sign    Days of Exercise per Week: 4 days    Minutes of Exercise per Session: 30 min  Stress: No Stress Concern Present (05/31/2024)   [employer] of Occupational Health - Occupational Stress Questionnaire    Feeling of Stress: Not at all  Social Connections: Socially Integrated (05/31/2024)   Social Connection and Isolation Panel    Frequency of Communication with Friends and Family: Once a week    Frequency of Social Gatherings with Friends and Family: Twice a week    Attends Religious Services: More than 4 times per year    Active Member of Clubs or Organizations: Yes    Attends Banker Meetings: More than 4 times per year    Marital Status: Married  Catering Manager Violence: Not At Risk (05/31/2024)   Epic    Fear of Current or Ex-Partner: No    Emotionally Abused: No    Physically Abused: No    Sexually Abused: No  Depression (PHQ2-9): Low Risk (05/31/2024)   Depression (PHQ2-9)    PHQ-2 Score: 3  Alcohol Screen: Low Risk (03/21/2023)   Alcohol Screen    Last Alcohol Screening Score (AUDIT): 0  Housing:  Unknown (05/31/2024)   Epic    Unable to Pay for Housing in the Last Year: No    Number of Times Moved in the Last Year: Not on file    Homeless in the Last Year: No  Utilities: Not At Risk (05/31/2024)   Epic    Threatened with loss of utilities: No  Health Literacy: Adequate Health Literacy (05/31/2024)   B1300 Health Literacy    Frequency of need for help with medical instructions: Never    FAMILY HISTORY: Family History  Problem Relation Age of Onset   Cancer Mother        esophageal   Heart disease Father  Coronary artery disease Father    Alcohol abuse Father    COPD Father    Hyperlipidemia Sister    Obesity Sister    Hyperlipidemia Sister    Hyperlipidemia Brother    Hyperlipidemia Brother    Hyperlipidemia Brother    Coronary artery disease Maternal Grandmother    Alcohol abuse Sister        PHYSICAL EXAMINATION  ECOG PERFORMANCE STATUS: 1 - Symptomatic but completely ambulatory  Vitals:   07/25/24 1138  BP: (!) 117/49  Pulse: 61  Resp: 17  Temp: (!) 97.2 F (36.2 C)  SpO2: 96%    Physical Exam Constitutional:      General: She is not in acute distress.    Appearance: Normal appearance. She is not toxic-appearing.  HENT:     Head: Normocephalic and atraumatic.     Mouth/Throat:     Comments: Graft appears healthy.  NO lesions Eyes:     General: No scleral icterus. Cardiovascular:     Rate and Rhythm: Normal rate and regular rhythm.     Pulses: Normal pulses.     Heart sounds: Normal heart sounds.  Pulmonary:     Effort: Pulmonary effort is normal.     Breath sounds: Normal breath sounds.  Abdominal:     General: Abdomen is flat. Bowel sounds are normal. There is no distension.     Palpations: Abdomen is soft.     Tenderness: There is no abdominal tenderness.  Musculoskeletal:        General: No swelling.     Cervical back: Neck supple.  Lymphadenopathy:     Cervical: No cervical adenopathy.  Skin:    General: Skin is warm and dry.      Findings: No rash.  Neurological:     General: No focal deficit present.     Mental Status: She is alert.  Psychiatric:        Mood and Affect: Mood normal.        Behavior: Behavior normal.     LABORATORY DATA:  CBC    Component Value Date/Time   WBC 6.2 07/02/2024 1431   RBC 4.18 07/02/2024 1431   HGB 12.8 07/02/2024 1431   HGB 13.3 03/27/2024 0907   HCT 40.1 07/02/2024 1431   HCT 41.5 03/27/2024 0907   PLT 220 07/02/2024 1431   PLT 226 03/27/2024 0907   MCV 95.9 07/02/2024 1431   MCV 98 (H) 03/27/2024 0907   MCH 30.6 07/02/2024 1431   MCHC 31.9 07/02/2024 1431   RDW 12.0 07/02/2024 1431   RDW 12.0 03/27/2024 0907   LYMPHSABS 1.0 03/27/2024 0907   MONOABS 0.5 07/19/2021 0848   EOSABS 0.1 03/27/2024 0907   BASOSABS 0.0 03/27/2024 0907    CMP     Component Value Date/Time   NA 143 07/02/2024 1431   NA 140 03/27/2024 0907   K 4.7 07/02/2024 1431   CL 105 07/02/2024 1431   CO2 31 07/02/2024 1431   GLUCOSE 103 (H) 07/02/2024 1431   BUN 18 07/02/2024 1431   BUN 19 03/27/2024 0907   CREATININE 0.71 07/02/2024 1431   CALCIUM  9.3 07/02/2024 1431   PROT 6.2 07/02/2024 1431   PROT 6.6 03/27/2024 0907   ALBUMIN 4.4 03/27/2024 0907   AST 21 07/02/2024 1431   AST 19 07/19/2021 0848   ALT 11 07/02/2024 1431   ALT 12 07/19/2021 0848   ALKPHOS 60 03/27/2024 0907   BILITOT 0.2 07/02/2024 1431   BILITOT 0.3 03/27/2024 9092  BILITOT 0.3 07/19/2021 0848   GFRNONAA >60 05/28/2024 0649   GFRNONAA >60 07/19/2021 0848   GFRAA 83 08/26/2020 0927    I have reviewed her imaging results from care everywhere.  ASSESSMENT and PLAN:    Assessment and Plan Assessment & Plan Pulmonary aspergillosis Right upper lobe nodule identified as pulmonary aspergillosis via biopsy. Voriconazole  therapy ongoing. Transient hypertension episode reported, possibly related to voriconazole . - Discuss with pulmonary team regarding the noticeable HTN since she started  voriconazole .  History of head and neck cancer in remission Remains in remission with no evidence of disease on recent ENT evaluation. - Continued annual ENT follow-up.   Total encounter time: 30 minutes in face-to-face visit time, chart review, lab review, care coordination, and documentation of the encounter.   *Total Encounter Time as defined by the Centers for Medicare and Medicaid Services includes, in addition to the face-to-face time of a patient visit (documented in the note above) non-face-to-face time: obtaining and reviewing outside history, ordering and reviewing medications, tests or procedures, care coordination (communications with other health care professionals or caregivers) and documentation in the medical record.

## 2024-07-25 NOTE — Progress Notes (Signed)
 "  Referring Provider: Lauraine Lites NP Reason for Referral: possible aspergillosis   Patient Active Problem List   Diagnosis Date Noted   Medication monitoring encounter 07/25/2024   Invasive aspergillosis (HCC) 07/02/2024   Medication management 07/02/2024   Lung nodule 05/20/2024   Menopausal and perimenopausal disorder 09/25/2023   Dizziness 03/02/2023   Malaise 02/28/2022   Hematuria, microscopic 02/28/2022   Tick bite of abdomen 02/28/2022   Weight loss 02/07/2022   New daily persistent headache 01/19/2022   Vitamin D  insufficiency 12/20/2021   Centrilobular emphysema (HCC) 12/20/2021   Carcinoma of lower gingiva (HCC) 09/14/2021   History of radiation to head and neck region 06/25/2021   Encounter for dental examination 06/25/2021   Xerostomia due to radiotherapy 06/25/2021   Dysgeusia 06/25/2021   Trismus 06/25/2021   Encounter for general adult medical examination with abnormal findings 06/06/2021   Encounter for osteoporosis screening in asymptomatic postmenopausal patient 06/06/2021   Visit for screening mammogram 06/06/2021   Need for immunization against influenza 06/06/2021   Symptomatic anemia 05/18/2021   CAP (community acquired pneumonia) 05/17/2021   Normocytic anemia 05/17/2021   Moderate protein-calorie malnutrition 05/17/2021   Hyponatremia 05/17/2021   Aortic atherosclerosis 04/25/2021   Chest pain of uncertain etiology 04/07/2021   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 chemotherapy 04/07/2021   Mucositis due to antineoplastic therapy 03/31/2021   Drug induced constipation 03/31/2021   Chemotherapy-induced nausea 03/31/2021   Port-A-Cath in place 03/09/2021   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   Carcinoma of lower gum (HCC) 02/20/2021   Squamous cell carcinoma of mouth (HCC) 12/08/2020   Gastritis 12/08/2020   Anxiety 12/08/2020   Need for prophylactic vaccination and inoculation against influenza 07/30/2020   Depression 11/26/2015    GERD (gastroesophageal reflux disease) 11/26/2015   Migraine headache 11/26/2015   Screening for diabetes mellitus 09/03/2015   Hypothyroidism 07/22/2009   Mixed hyperlipidemia 07/22/2009   Hypertension, essential 07/22/2009   CAD, NATIVE VESSEL 07/22/2009   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   Coronary artery spasm 07/22/2009    Patient's Medications  New Prescriptions   VORICONAZOLE  (VFEND ) 200 MG TABLET    Take 1 tablet (200 mg total) by mouth 2 (two) times daily.  Previous Medications   ACETAMINOPHEN  (TYLENOL ) 500 MG TABLET    Take 500 mg by mouth every 6 (six) hours as needed for mild pain.   ALBUTEROL  (VENTOLIN  HFA) 108 (90 BASE) MCG/ACT INHALER    Inhale 2 puffs into the lungs every 6 (six) hours as needed for wheezing or shortness of breath.   ASPIRIN  EC 81 MG TABLET    Take 1 tablet (81 mg total) by mouth daily. Swallow whole.   DILTIAZEM  (CARDIZEM  CD) 240 MG 24 HR CAPSULE    TAKE 1 CAPSULE BY MOUTH ONCE DAILY   FLUTICASONE -UMECLIDIN-VILANT (TRELEGY ELLIPTA ) 100-62.5-25 MCG/ACT AEPB    Take 1 Inhalation by mouth daily.   LEVOTHYROXINE  (SYNTHROID ) 88 MCG TABLET    Take 1 tablet (88 mcg total) by mouth daily before breakfast.   NITROGLYCERIN  (NITROSTAT ) 0.4 MG SL TABLET    Place 1 tablet (0.4 mg total) under the tongue every 5 (five) minutes as needed.   VORICONAZOLE  (VFEND ) 200 MG TABLET    Take 1 tablet (200 mg total) by mouth 2 (two) times daily. After completing 300mg  po bid for 1 day  Modified Medications   No medications on file  Discontinued Medications   No medications on file    Subjective:  Discussed the  use of AI scribe software for clinical note transcription with the patient, who gave verbal consent to proceed.   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  She was found to have a pulmonary nodule in her routine annual lung cancer screening with CT leading to subsequent PET and bronchoscopy/biopsy. She was told the biopsy was negative for cancer pathology concerning for Aspergillus.  She has COPD and emphysema from long-term smoking, quit in May 2022. She has baseline exertional shortness of breath and uses Trelegy inhaler. Minimal cough, chest pain.  Denies fever and chill.  denies nausea, vomiting or diarrhea, rashes or GU symptoms or joint pain.  Denies headache, blurry vision or neck pain. she denies frequent exacerbations requiring repeated prednisone  courses, nor has used prednisone  recently.  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 She is in remission without known recurrence.  She is a recovering alcoholic and quit drinking in May 2022. She has difficulty chewing and swallowing and struggles to maintain weight. She had significant loss of weight after her diagnosis of squamous cell carcinoma of her mouth.   She lives with husband.  She was born in [place]  and moved to [place] , [place] and now in [place].  Lives with dog and cats. Used to work in audiological scientist.  She believes the Aspergillus exposure may have occurred while digging in rotting mulch and topsoil without a mask in late spring last year, with exposure to mushroom spores and animal feces.  1/15  Accompanied by husband.  Reports starting taking voriconazole  the following day after last clinic visit as instructed without missing doses or concerns. Feels mild constipated which she is taking increased fibers. Feels breathing is better when climbing hills and walking on plain, not as much winded, thinks has gained some weight.  No any GI concerns or rashes.  Review  of Systems: All systems reviewed with pertinent positive and negative as listed above.   Past Medical History:  Diagnosis Date   Aortic atherosclerosis    CAD (coronary artery disease)    Mild non-obstructive disease by cath January 2011. Coronary CTA showed  mild (25-49%) plaque in the RCA and minimal (<25%) coronary CTA 04/2021 with coronary Ca score 257.  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   Past Surgical History:  Procedure Laterality Date   APPENDECTOMY  Feb 2015   BRONCHIAL NEEDLE ASPIRATION BIOPSY  05/28/2024   Procedure: BRONCHOSCOPY, WITH NEEDLE ASPIRATION BIOPSY;  Surgeon: Shelah Lamar RAMAN, MD;  Location: [facility] ENDOSCOPY;  Service: Pulmonary;;   CRYOTHERAPY  05/28/2024   Procedure: CRYOTHERAPY;  Surgeon: Shelah Lamar RAMAN, MD;  Location: [facility] ENDOSCOPY;  Service: Pulmonary;;   EXCISION ORAL TUMOR  01/18/2021  see details [facility] Everywhere   FUDUCIAL PLACEMENT  05/28/2024   Procedure: INSERTION, FIDUCIAL MARKER, GOLD;  Surgeon: Shelah Lamar RAMAN, MD;  Location: [facility] ENDOSCOPY;  Service: Pulmonary;;   IR IMAGING GUIDED PORT INSERTION  03/02/2021   IR REMOVAL TUN ACCESS W/ PORT W/O FL MOD SED  08/23/2022   TONSILLECTOMY     TUBAL LIGATION     VIDEO BRONCHOSCOPY WITH ENDOBRONCHIAL NAVIGATION Right 05/28/2024   Procedure: VIDEO BRONCHOSCOPY WITH ENDOBRONCHIAL NAVIGATION;  Surgeon: Shelah Lamar RAMAN, MD;  Location: [facility] ENDOSCOPY;  Service: Pulmonary;  Laterality: Right;  Potential difficult intubation, hx of oral squamous cell cancer s/p surgery, chemo/radiation   Social  History[1]  Family History  Problem Relation Age of Onset   Cancer Mother        esophageal   Heart disease Father    Coronary artery disease Father    Alcohol abuse Father    COPD Father    Hyperlipidemia Sister    Obesity Sister    Hyperlipidemia Sister    Hyperlipidemia Brother    Hyperlipidemia Brother    Hyperlipidemia Brother    Coronary artery disease Maternal Grandmother    Alcohol abuse Sister     Allergies[2]  Health Maintenance  Topic Date Due   Mammogram  10/02/2024   Lung Cancer Screening  04/10/2025   Medicare Annual Wellness (AWV)  05/31/2025   Bone Density Scan  10/18/2025   Colonoscopy  04/09/2028   Pneumococcal Vaccine: 50+ Years  Completed   Influenza Vaccine  Completed   Meningococcal B Vaccine  Aged Out   DTaP/Tdap/Td  Discontinued   COVID-19 Vaccine  Discontinued   Hepatitis C Screening  Discontinued   Zoster Vaccines- Shingrix  Discontinued    Objective: BP (!) 146/66   Pulse 67   Temp 97.6 F (36.4 C) (Oral)   Wt 114 lb (51.7 kg)   SpO2 93%   BMI 18.97 kg/m    Physical Exam Constitutional:      Appearance: Normal appearance. Thin looking HENT:     Head: Normocephalic and atraumatic.      Mouth: Mucous membranes are moist.  Eyes:    Conjunctiva/sclera: Conjunctivae normal.     Pupils: Pupils are equal, round, and b/l symmetrical    Cardiovascular:     Rate and Rhythm: Normal rate     Heart sounds:  Pulmonary:     Effort: Pulmonary effort is normal.     Breath sounds:   Abdominal:     General: Non distended     Palpations:    Musculoskeletal:        General: Ambulatory   Skin:    General: Skin is warm and dry.     Comments:  Neurological:     General: grossly non focal     Mental Status: awake, alert, non focal exam   Psychiatric:        Mood and Affect: Mood normal.   Lab Results Lab Results  Component Value Date   WBC 6.2 07/02/2024   HGB 12.8 07/02/2024   HCT 40.1 07/02/2024   MCV 95.9 07/02/2024    PLT 220 07/02/2024    Lab Results  Component Value Date   CREATININE 0.71 07/02/2024   BUN 18 07/02/2024   NA 143 07/02/2024   K 4.7 07/02/2024   CL 105 07/02/2024   CO2 31 07/02/2024    Lab Results  Component Value Date   ALT 11 07/02/2024   AST 21 07/02/2024   ALKPHOS 60  03/27/2024   BILITOT 0.2 07/02/2024    Lab Results  Component Value Date   CHOL 155 03/27/2024   HDL 81 03/27/2024   LDLCALC 61 03/27/2024   TRIG 64 03/27/2024   CHOLHDL 1.9 03/27/2024   No results found for: LABRPR, RPRTITER No results found for: HIV1RNAQUANT, HIV1RNAVL, CD4TABS   Microbiology Results for orders placed or performed during the hospital encounter of 05/28/24  Fungus Culture With Stain     Status: None   Collection Time: 05/28/24  8:28 AM   Specimen: Bronchial Alveolar Lavage; Respiratory  Result Value Ref Range Status   Fungus Stain Final report  Final   Fungus (Mycology) Culture Final report  Final    Comment: (NOTE) Performed At: [facility] [address] [place], [place] [id number] Jennette Shorter MD Ey:[id number]    Fungal Source BRONCHIAL ALVEOLAR LAVAGE  Final    Comment: Performed at [facility], [address], [place], [place] [address]  Aerobic/Anaerobic Culture w Gram Stain (surgical/deep wound)     Status: None   Collection Time: 05/28/24  8:28 AM   Specimen: Bronchial Alveolar Lavage; Respiratory  Result Value Ref Range Status   Specimen Description BRONCHIAL ALVEOLAR LAVAGE  Final   Special Requests NONE  Final   Gram Stain NO WBC SEEN NO ORGANISMS SEEN   Final   Culture   Final    No growth aerobically or anaerobically. Performed at [facility], [address], [place], [place] [address]    Report Status 06/02/2024 FINAL  Final  Acid Fast Culture with reflexed sensitivities     Status: None   Collection Time: 05/28/24  8:28 AM   Specimen: Bronchial Alveolar Lavage; Respiratory  Result Value Ref Range Status   Acid Fast  Culture Negative  Final    Comment: (NOTE) No acid fast bacilli isolated after 6 weeks. Performed At: [facility] [address] [place], [place] [id number] Jennette Shorter MD Ey:[id number]    Source of Sample BRONCHIAL ALVEOLAR LAVAGE  Final    Comment: Performed at [facility], [address], [place], [place] [address]  Acid Fast Smear (AFB)     Status: None   Collection Time: 05/28/24  8:28 AM   Specimen: Bronchial Alveolar Lavage; Respiratory  Result Value Ref Range Status   AFB Specimen Processing Concentration  Final   Acid Fast Smear Negative  Final    Comment: (NOTE) Performed At: [facility] [address] [place], [place] [id number] Jennette Shorter MD Ey:[id number]    Source (AFB) BRONCHIAL ALVEOLAR LAVAGE  Final    Comment: Performed at [facility], [address] [place], [place] [address]  Fungus Culture Result     Status: None   Collection Time: 05/28/24  8:28 AM  Result Value Ref Range Status   Result 1 Comment  Final    Comment: (NOTE) KOH/Calcofluor preparation:  no fungus observed. Performed At: [facility] [address] [place], [place] [id number] Jennette Shorter MD Ey:[id number]   Fungal organism reflex     Status: None   Collection Time: 05/28/24  8:28 AM  Result Value Ref Range Status   Fungal result 1 Comment  Final    Comment: (NOTE) No yeast or mold isolated after 4 weeks. Performed At: [facility] [address] [place], [place] [id number] Jennette Shorter MD Ey:[id number]    Pathology  FINAL MICROSCOPIC DIAGNOSIS:  - No malignant cells identified   SPECIMEN ADEQUACY:  Satisfactory for evaluation  ____ FINAL MICROSCOPIC DIAGNOSIS:  A. LUNG, RUL, FINE NEEDLE ASPIRATION:  - No malignant cells identified  - Fungal organisms present   SPECIMEN ADEQUACY:  A. Satisfactory for Evaluation    ADDENDUM:   Per clinical request, GMS stain was performed. The stain highlights  fungal organisms  morphologically indicative of aspergillus. Correlation  with microbiologic test is recommended.   Imaging No results found.  04/29/24 IMPRESSION: 1. Hypermetabolic right upper lobe nodule is most likely due to stage IA primary bronchogenic carcinoma. Difficult to definitively exclude a metastasis in this patient with a history oropharyngeal cancer. 2. Focal hypermetabolism in the right hilum. A metastatic lymph node cannot be excluded. 3. Mildly hypermetabolic clustered nodularity in the right lower lobe, likely infectious in etiology. Recommend attention on follow-up. 4. Possible ventral bladder wall thickening. Please correlate clinically and consider CT abdomen pelvis without and with contrast, as clinically indicated. 5. Aortic atherosclerosis (ICD10-I70.0). Coronary artery calcification.  04/10/24 IMPRESSION: 1. Lung-RADS 4A, suspicious. Follow up low-dose chest CT without contrast in 3 months (please use the following order, CT CHEST LCS NODULE FOLLOW-UP W/O CM) is recommended. Alternatively, PET may be considered when there is a solid component 8 mm or larger. New spiculated solid peripheral right upper lobe pulmonary nodule measuring 10.3 mm in volume derived mean diameter, suspicious for primary bronchogenic carcinoma. 2. Two-vessel coronary atherosclerosis. 3. Aortic Atherosclerosis (ICD10-I70.0) and Emphysema (ICD10-J43.9).   Assessment/Plan # Possible invasive pulmonary aspergillosis # Spiculated solid peripheral right upper lobe pulmonary nodule measuring 10.3 mm in volume +  solid right lower lobe pulmonary nodules up to 3.6 mm posteriorly  - Risk factor: History of emphysema/COPD but denies need for frequent steroids.  No other risk factors - 05/18/21 HIV NR - 05/28/24 Bronchoscopy AFB negative, cx pending fungal stain and cx negative, aerobic/anaerobic cx NG - 07/03/24 started taking Voriconazole  300 mg p.o. twice daily then 20 mg p.o. bid thereafter - DDIS  reviewed, have instructed patient to contact PCP to switch simvastatin  to atorvastatin - Side effects of voriconazole  reviewed  like visual disturbances, hepatotoxicity, GI side effects, electrolyte disturbances, Cardiac effects, rashes etc  - 12/23 and 12/30 labs reviewed and discussed  Plan  - continue Voriconazole  200mg  po bid, refills sent - CMP and voriconazole  level  - fu in 4 weeks   # Emphysema/COPD - fu with Pulmonary   # EX smoker/Ex alcohol user - congratulated on staying clean  I personally spent a total of 30  minutes in the care of the patient today including preparing to see the patient, performing a medically appropriate exam/evaluation, counseling and educating, placing orders, documenting clinical information in the EHR, independently interpreting results, and communicating results.  Annalee Joseph, MD [facility] 07/25/2024, 9:45 AM      [1]  Social History Tobacco Use   Smoking status: Former    Current packs/day: 0.00    Average packs/day: 1 pack/day for 40.0 years (40.0 ttl pk-yrs)    Types: Cigarettes    Start date: 05/11/1969    Quit date: 11/08/2020    Years since quitting: 3.7   Smokeless tobacco: Never  Vaping Use   Vaping status: Never Used  Substance Use Topics   Alcohol use: Not Currently    Comment: Recovering alcoholic   Drug use: Never  [2]  Allergies Allergen Reactions   Codeine Other (See Comments)    Headache  HEADACHE   Oxycodone      Caused headaches.    "

## 2024-07-28 LAB — COMPREHENSIVE METABOLIC PANEL WITH GFR
AG Ratio: 1.7 (calc) (ref 1.0–2.5)
ALT: 12 U/L (ref 6–29)
AST: 19 U/L (ref 10–35)
Albumin: 4.3 g/dL (ref 3.6–5.1)
Alkaline phosphatase (APISO): 64 U/L (ref 37–153)
BUN: 15 mg/dL (ref 7–25)
CO2: 29 mmol/L (ref 20–32)
Calcium: 9.5 mg/dL (ref 8.6–10.4)
Chloride: 104 mmol/L (ref 98–110)
Creat: 0.9 mg/dL (ref 0.60–1.00)
Globulin: 2.5 g/dL (ref 1.9–3.7)
Glucose, Bld: 82 mg/dL (ref 65–99)
Potassium: 4.5 mmol/L (ref 3.5–5.3)
Sodium: 141 mmol/L (ref 135–146)
Total Bilirubin: 0.3 mg/dL (ref 0.2–1.2)
Total Protein: 6.8 g/dL (ref 6.1–8.1)
eGFR: 68 mL/min/1.73m2

## 2024-07-28 LAB — VORICONAZOLE QUANT BY LC/MS: Voriconazole, Quant, by LC/MS: 1.8 ug/mL

## 2024-07-30 ENCOUNTER — Ambulatory Visit: Payer: Self-pay | Admitting: Infectious Diseases

## 2024-08-19 ENCOUNTER — Ambulatory Visit: Admitting: Pulmonary Disease

## 2024-08-30 ENCOUNTER — Ambulatory Visit: Payer: Self-pay | Admitting: Infectious Diseases

## 2024-09-24 ENCOUNTER — Ambulatory Visit: Admitting: Physician Assistant

## 2025-06-11 ENCOUNTER — Ambulatory Visit

## 2025-07-25 ENCOUNTER — Inpatient Hospital Stay: Admitting: Hematology and Oncology
